# Patient Record
Sex: Female | Born: 1945 | Race: White | Hispanic: No | Marital: Married | Smoking: Never smoker
Health system: Southern US, Community
[De-identification: ages and names within clinical notes are randomized; demographics above are authoritative.]

## PROBLEM LIST (undated history)

## (undated) DIAGNOSIS — C801 Malignant (primary) neoplasm, unspecified: Secondary | ICD-10-CM

## (undated) DIAGNOSIS — E785 Hyperlipidemia, unspecified: Secondary | ICD-10-CM

## (undated) DIAGNOSIS — L940 Localized scleroderma [morphea]: Secondary | ICD-10-CM

## (undated) DIAGNOSIS — J8410 Pulmonary fibrosis, unspecified: Secondary | ICD-10-CM

## (undated) DIAGNOSIS — I2721 Secondary pulmonary arterial hypertension: Secondary | ICD-10-CM

## (undated) DIAGNOSIS — N200 Calculus of kidney: Secondary | ICD-10-CM

## (undated) DIAGNOSIS — M1990 Unspecified osteoarthritis, unspecified site: Secondary | ICD-10-CM

## (undated) DIAGNOSIS — S72009A Fracture of unspecified part of neck of unspecified femur, initial encounter for closed fracture: Secondary | ICD-10-CM

## (undated) DIAGNOSIS — S72002A Fracture of unspecified part of neck of left femur, initial encounter for closed fracture: Secondary | ICD-10-CM

## (undated) DIAGNOSIS — M358 Other specified systemic involvement of connective tissue: Secondary | ICD-10-CM

## (undated) DIAGNOSIS — K743 Primary biliary cirrhosis: Secondary | ICD-10-CM

## (undated) HISTORY — DX: Secondary pulmonary arterial hypertension: I27.21

## (undated) HISTORY — DX: Calculus of kidney: N20.0

## (undated) HISTORY — DX: Primary biliary cirrhosis: K74.3

## (undated) HISTORY — DX: Unspecified osteoarthritis, unspecified site: M19.90

## (undated) HISTORY — PX: OTHER SURGICAL HISTORY: SHX169

## (undated) HISTORY — DX: Other specified systemic involvement of connective tissue: M35.8

## (undated) HISTORY — DX: Localized scleroderma (morphea): L94.0

## (undated) HISTORY — PX: LUNG BIOPSY: SHX232

## (undated) HISTORY — DX: Fracture of unspecified part of neck of unspecified femur, initial encounter for closed fracture: S72.009A

## (undated) HISTORY — PX: SQUAMOUS CELL CARCINOMA EXCISION: SHX2433

## (undated) HISTORY — DX: Fracture of unspecified part of neck of left femur, initial encounter for closed fracture: S72.002A

## (undated) HISTORY — DX: Hyperlipidemia, unspecified: E78.5

## (undated) HISTORY — DX: Pulmonary fibrosis, unspecified: J84.10

---

## 1999-03-28 ENCOUNTER — Ambulatory Visit (HOSPITAL_COMMUNITY): Admission: RE | Admit: 1999-03-28 | Discharge: 1999-03-28 | Payer: Self-pay | Admitting: Gastroenterology

## 1999-04-21 ENCOUNTER — Ambulatory Visit (HOSPITAL_COMMUNITY): Admission: RE | Admit: 1999-04-21 | Discharge: 1999-04-21 | Payer: Self-pay | Admitting: Gastroenterology

## 2005-02-14 ENCOUNTER — Ambulatory Visit: Payer: Self-pay | Admitting: Rheumatology

## 2005-02-23 ENCOUNTER — Ambulatory Visit: Payer: Self-pay | Admitting: Rheumatology

## 2005-06-14 ENCOUNTER — Ambulatory Visit: Payer: Self-pay | Admitting: Internal Medicine

## 2005-07-13 ENCOUNTER — Ambulatory Visit: Payer: Self-pay | Admitting: Internal Medicine

## 2005-08-13 ENCOUNTER — Inpatient Hospital Stay (HOSPITAL_COMMUNITY): Admission: RE | Admit: 2005-08-13 | Discharge: 2005-08-16 | Payer: Self-pay | Admitting: Thoracic Surgery

## 2005-08-22 ENCOUNTER — Encounter: Admission: RE | Admit: 2005-08-22 | Discharge: 2005-08-22 | Payer: Self-pay | Admitting: Thoracic Surgery

## 2005-10-03 ENCOUNTER — Encounter: Admission: RE | Admit: 2005-10-03 | Discharge: 2005-10-03 | Payer: Self-pay | Admitting: Thoracic Surgery

## 2005-12-25 ENCOUNTER — Other Ambulatory Visit: Payer: Self-pay

## 2005-12-27 ENCOUNTER — Ambulatory Visit: Payer: Self-pay | Admitting: Urology

## 2006-01-25 ENCOUNTER — Other Ambulatory Visit: Payer: Self-pay

## 2006-01-31 ENCOUNTER — Ambulatory Visit: Payer: Self-pay | Admitting: Urology

## 2007-02-07 ENCOUNTER — Ambulatory Visit: Payer: Self-pay | Admitting: Urology

## 2007-02-07 ENCOUNTER — Other Ambulatory Visit: Payer: Self-pay

## 2007-02-13 ENCOUNTER — Ambulatory Visit: Payer: Self-pay | Admitting: Urology

## 2007-04-07 ENCOUNTER — Ambulatory Visit: Payer: Self-pay | Admitting: Family Medicine

## 2007-04-21 ENCOUNTER — Ambulatory Visit: Payer: Self-pay | Admitting: Family Medicine

## 2008-04-26 ENCOUNTER — Ambulatory Visit: Payer: Self-pay | Admitting: Family Medicine

## 2009-05-02 ENCOUNTER — Ambulatory Visit: Payer: Self-pay

## 2009-08-17 ENCOUNTER — Ambulatory Visit: Payer: Self-pay | Admitting: Gastroenterology

## 2009-08-17 LAB — HM COLONOSCOPY

## 2010-05-03 ENCOUNTER — Ambulatory Visit: Payer: Self-pay | Admitting: Family Medicine

## 2011-03-28 LAB — HM PAP SMEAR

## 2011-05-23 ENCOUNTER — Ambulatory Visit: Payer: Self-pay

## 2011-11-08 DIAGNOSIS — H2510 Age-related nuclear cataract, unspecified eye: Secondary | ICD-10-CM | POA: Diagnosis not present

## 2011-11-15 DIAGNOSIS — L723 Sebaceous cyst: Secondary | ICD-10-CM | POA: Diagnosis not present

## 2011-11-28 DIAGNOSIS — B351 Tinea unguium: Secondary | ICD-10-CM | POA: Diagnosis not present

## 2011-11-28 DIAGNOSIS — L719 Rosacea, unspecified: Secondary | ICD-10-CM | POA: Diagnosis not present

## 2011-11-28 DIAGNOSIS — D485 Neoplasm of uncertain behavior of skin: Secondary | ICD-10-CM | POA: Diagnosis not present

## 2011-11-28 DIAGNOSIS — L570 Actinic keratosis: Secondary | ICD-10-CM | POA: Diagnosis not present

## 2011-12-17 DIAGNOSIS — I270 Primary pulmonary hypertension: Secondary | ICD-10-CM | POA: Diagnosis not present

## 2011-12-17 DIAGNOSIS — M359 Systemic involvement of connective tissue, unspecified: Secondary | ICD-10-CM | POA: Diagnosis not present

## 2012-01-18 DIAGNOSIS — N764 Abscess of vulva: Secondary | ICD-10-CM | POA: Diagnosis not present

## 2012-01-18 DIAGNOSIS — L0291 Cutaneous abscess, unspecified: Secondary | ICD-10-CM | POA: Diagnosis not present

## 2012-01-18 DIAGNOSIS — L723 Sebaceous cyst: Secondary | ICD-10-CM | POA: Diagnosis not present

## 2012-04-09 DIAGNOSIS — J8410 Pulmonary fibrosis, unspecified: Secondary | ICD-10-CM | POA: Diagnosis not present

## 2012-04-09 DIAGNOSIS — Z0000 Encounter for general adult medical examination without abnormal findings: Secondary | ICD-10-CM | POA: Diagnosis not present

## 2012-04-09 DIAGNOSIS — G4700 Insomnia, unspecified: Secondary | ICD-10-CM | POA: Diagnosis not present

## 2012-04-09 DIAGNOSIS — Z13220 Encounter for screening for lipoid disorders: Secondary | ICD-10-CM | POA: Diagnosis not present

## 2012-04-09 DIAGNOSIS — Z23 Encounter for immunization: Secondary | ICD-10-CM | POA: Diagnosis not present

## 2012-04-09 DIAGNOSIS — I7300 Raynaud's syndrome without gangrene: Secondary | ICD-10-CM | POA: Diagnosis not present

## 2012-04-09 DIAGNOSIS — F329 Major depressive disorder, single episode, unspecified: Secondary | ICD-10-CM | POA: Diagnosis not present

## 2012-04-21 ENCOUNTER — Ambulatory Visit: Payer: Self-pay

## 2012-04-21 DIAGNOSIS — R971 Elevated cancer antigen 125 [CA 125]: Secondary | ICD-10-CM | POA: Diagnosis not present

## 2012-04-21 DIAGNOSIS — Z049 Encounter for examination and observation for unspecified reason: Secondary | ICD-10-CM | POA: Diagnosis not present

## 2012-06-25 ENCOUNTER — Ambulatory Visit: Payer: Self-pay

## 2012-06-25 DIAGNOSIS — Z1231 Encounter for screening mammogram for malignant neoplasm of breast: Secondary | ICD-10-CM | POA: Diagnosis not present

## 2012-07-30 DIAGNOSIS — H2510 Age-related nuclear cataract, unspecified eye: Secondary | ICD-10-CM | POA: Diagnosis not present

## 2012-07-30 DIAGNOSIS — D281 Benign neoplasm of vagina: Secondary | ICD-10-CM | POA: Diagnosis not present

## 2012-08-11 DIAGNOSIS — L0292 Furuncle, unspecified: Secondary | ICD-10-CM | POA: Diagnosis not present

## 2012-08-13 DIAGNOSIS — H2510 Age-related nuclear cataract, unspecified eye: Secondary | ICD-10-CM | POA: Diagnosis not present

## 2012-08-14 DIAGNOSIS — J8410 Pulmonary fibrosis, unspecified: Secondary | ICD-10-CM | POA: Diagnosis not present

## 2012-08-22 DIAGNOSIS — M7750 Other enthesopathy of unspecified foot: Secondary | ICD-10-CM | POA: Diagnosis not present

## 2012-08-22 DIAGNOSIS — M359 Systemic involvement of connective tissue, unspecified: Secondary | ICD-10-CM | POA: Diagnosis not present

## 2012-08-27 DIAGNOSIS — M810 Age-related osteoporosis without current pathological fracture: Secondary | ICD-10-CM | POA: Diagnosis not present

## 2012-08-28 DIAGNOSIS — H52229 Regular astigmatism, unspecified eye: Secondary | ICD-10-CM | POA: Diagnosis not present

## 2012-08-28 DIAGNOSIS — H269 Unspecified cataract: Secondary | ICD-10-CM | POA: Diagnosis not present

## 2012-08-28 DIAGNOSIS — H2510 Age-related nuclear cataract, unspecified eye: Secondary | ICD-10-CM | POA: Diagnosis not present

## 2012-09-09 DIAGNOSIS — H2510 Age-related nuclear cataract, unspecified eye: Secondary | ICD-10-CM | POA: Diagnosis not present

## 2012-09-11 DIAGNOSIS — H2510 Age-related nuclear cataract, unspecified eye: Secondary | ICD-10-CM | POA: Diagnosis not present

## 2012-09-11 DIAGNOSIS — H269 Unspecified cataract: Secondary | ICD-10-CM | POA: Diagnosis not present

## 2012-10-28 DIAGNOSIS — R971 Elevated cancer antigen 125 [CA 125]: Secondary | ICD-10-CM | POA: Diagnosis not present

## 2012-10-29 DIAGNOSIS — H26499 Other secondary cataract, unspecified eye: Secondary | ICD-10-CM | POA: Diagnosis not present

## 2012-10-31 DIAGNOSIS — R971 Elevated cancer antigen 125 [CA 125]: Secondary | ICD-10-CM | POA: Diagnosis not present

## 2012-11-10 DIAGNOSIS — D4959 Neoplasm of unspecified behavior of other genitourinary organ: Secondary | ICD-10-CM | POA: Diagnosis not present

## 2012-11-10 DIAGNOSIS — R971 Elevated cancer antigen 125 [CA 125]: Secondary | ICD-10-CM | POA: Diagnosis not present

## 2012-11-26 DIAGNOSIS — H35359 Cystoid macular degeneration, unspecified eye: Secondary | ICD-10-CM | POA: Diagnosis not present

## 2012-11-27 DIAGNOSIS — Z1283 Encounter for screening for malignant neoplasm of skin: Secondary | ICD-10-CM | POA: Diagnosis not present

## 2012-11-27 DIAGNOSIS — L219 Seborrheic dermatitis, unspecified: Secondary | ICD-10-CM | POA: Diagnosis not present

## 2012-12-18 DIAGNOSIS — Z961 Presence of intraocular lens: Secondary | ICD-10-CM | POA: Diagnosis not present

## 2012-12-18 DIAGNOSIS — H35059 Retinal neovascularization, unspecified, unspecified eye: Secondary | ICD-10-CM | POA: Diagnosis not present

## 2012-12-18 DIAGNOSIS — H538 Other visual disturbances: Secondary | ICD-10-CM | POA: Diagnosis not present

## 2013-02-18 DIAGNOSIS — Z961 Presence of intraocular lens: Secondary | ICD-10-CM | POA: Diagnosis not present

## 2013-03-04 DIAGNOSIS — R0609 Other forms of dyspnea: Secondary | ICD-10-CM | POA: Diagnosis not present

## 2013-03-04 DIAGNOSIS — J310 Chronic rhinitis: Secondary | ICD-10-CM | POA: Diagnosis not present

## 2013-03-04 DIAGNOSIS — R05 Cough: Secondary | ICD-10-CM | POA: Diagnosis not present

## 2013-03-04 DIAGNOSIS — J8410 Pulmonary fibrosis, unspecified: Secondary | ICD-10-CM | POA: Diagnosis not present

## 2013-03-16 ENCOUNTER — Ambulatory Visit: Payer: Self-pay | Admitting: Specialist

## 2013-03-16 DIAGNOSIS — J8410 Pulmonary fibrosis, unspecified: Secondary | ICD-10-CM | POA: Diagnosis not present

## 2013-03-16 DIAGNOSIS — R059 Cough, unspecified: Secondary | ICD-10-CM | POA: Diagnosis not present

## 2013-03-16 DIAGNOSIS — R918 Other nonspecific abnormal finding of lung field: Secondary | ICD-10-CM | POA: Diagnosis not present

## 2013-03-23 DIAGNOSIS — J8410 Pulmonary fibrosis, unspecified: Secondary | ICD-10-CM | POA: Diagnosis not present

## 2013-03-23 DIAGNOSIS — I7300 Raynaud's syndrome without gangrene: Secondary | ICD-10-CM | POA: Diagnosis not present

## 2013-03-23 DIAGNOSIS — M359 Systemic involvement of connective tissue, unspecified: Secondary | ICD-10-CM | POA: Diagnosis not present

## 2013-03-25 DIAGNOSIS — R0609 Other forms of dyspnea: Secondary | ICD-10-CM | POA: Diagnosis not present

## 2013-03-25 DIAGNOSIS — J449 Chronic obstructive pulmonary disease, unspecified: Secondary | ICD-10-CM | POA: Diagnosis not present

## 2013-03-25 DIAGNOSIS — Z23 Encounter for immunization: Secondary | ICD-10-CM | POA: Diagnosis not present

## 2013-03-25 DIAGNOSIS — J8410 Pulmonary fibrosis, unspecified: Secondary | ICD-10-CM | POA: Diagnosis not present

## 2013-04-23 DIAGNOSIS — I270 Primary pulmonary hypertension: Secondary | ICD-10-CM | POA: Diagnosis not present

## 2013-04-24 DIAGNOSIS — H6120 Impacted cerumen, unspecified ear: Secondary | ICD-10-CM | POA: Diagnosis not present

## 2013-04-24 DIAGNOSIS — J3489 Other specified disorders of nose and nasal sinuses: Secondary | ICD-10-CM | POA: Diagnosis not present

## 2013-04-28 DIAGNOSIS — L0201 Cutaneous abscess of face: Secondary | ICD-10-CM | POA: Diagnosis not present

## 2013-06-17 DIAGNOSIS — N760 Acute vaginitis: Secondary | ICD-10-CM | POA: Diagnosis not present

## 2013-07-01 DIAGNOSIS — Z0000 Encounter for general adult medical examination without abnormal findings: Secondary | ICD-10-CM | POA: Diagnosis not present

## 2013-07-01 DIAGNOSIS — E785 Hyperlipidemia, unspecified: Secondary | ICD-10-CM | POA: Diagnosis not present

## 2013-07-01 DIAGNOSIS — G4700 Insomnia, unspecified: Secondary | ICD-10-CM | POA: Diagnosis not present

## 2013-07-01 DIAGNOSIS — N200 Calculus of kidney: Secondary | ICD-10-CM | POA: Diagnosis not present

## 2013-07-01 DIAGNOSIS — Z1331 Encounter for screening for depression: Secondary | ICD-10-CM | POA: Diagnosis not present

## 2013-07-21 ENCOUNTER — Ambulatory Visit: Payer: Self-pay

## 2013-07-21 DIAGNOSIS — Z1231 Encounter for screening mammogram for malignant neoplasm of breast: Secondary | ICD-10-CM | POA: Diagnosis not present

## 2013-08-12 DIAGNOSIS — M545 Low back pain, unspecified: Secondary | ICD-10-CM | POA: Diagnosis not present

## 2013-08-12 DIAGNOSIS — M4800 Spinal stenosis, site unspecified: Secondary | ICD-10-CM | POA: Diagnosis not present

## 2013-08-13 DIAGNOSIS — J8410 Pulmonary fibrosis, unspecified: Secondary | ICD-10-CM | POA: Diagnosis not present

## 2013-09-14 DIAGNOSIS — M359 Systemic involvement of connective tissue, unspecified: Secondary | ICD-10-CM | POA: Diagnosis not present

## 2013-09-14 DIAGNOSIS — I7300 Raynaud's syndrome without gangrene: Secondary | ICD-10-CM | POA: Diagnosis not present

## 2013-09-14 DIAGNOSIS — J8410 Pulmonary fibrosis, unspecified: Secondary | ICD-10-CM | POA: Diagnosis not present

## 2013-09-23 DIAGNOSIS — J8410 Pulmonary fibrosis, unspecified: Secondary | ICD-10-CM | POA: Diagnosis not present

## 2013-09-23 DIAGNOSIS — R05 Cough: Secondary | ICD-10-CM | POA: Diagnosis not present

## 2013-09-23 DIAGNOSIS — J209 Acute bronchitis, unspecified: Secondary | ICD-10-CM | POA: Diagnosis not present

## 2013-09-23 DIAGNOSIS — R059 Cough, unspecified: Secondary | ICD-10-CM | POA: Diagnosis not present

## 2013-11-30 DIAGNOSIS — L570 Actinic keratosis: Secondary | ICD-10-CM | POA: Diagnosis not present

## 2013-11-30 DIAGNOSIS — Z1283 Encounter for screening for malignant neoplasm of skin: Secondary | ICD-10-CM | POA: Diagnosis not present

## 2013-11-30 DIAGNOSIS — L719 Rosacea, unspecified: Secondary | ICD-10-CM | POA: Diagnosis not present

## 2014-01-27 DIAGNOSIS — M359 Systemic involvement of connective tissue, unspecified: Secondary | ICD-10-CM | POA: Insufficient documentation

## 2014-01-28 DIAGNOSIS — R0602 Shortness of breath: Secondary | ICD-10-CM | POA: Diagnosis not present

## 2014-01-28 DIAGNOSIS — R0902 Hypoxemia: Secondary | ICD-10-CM | POA: Diagnosis not present

## 2014-01-28 DIAGNOSIS — J8410 Pulmonary fibrosis, unspecified: Secondary | ICD-10-CM | POA: Diagnosis not present

## 2014-01-29 DIAGNOSIS — Z23 Encounter for immunization: Secondary | ICD-10-CM | POA: Diagnosis not present

## 2014-05-04 DIAGNOSIS — I7300 Raynaud's syndrome without gangrene: Secondary | ICD-10-CM | POA: Diagnosis not present

## 2014-05-04 DIAGNOSIS — M359 Systemic involvement of connective tissue, unspecified: Secondary | ICD-10-CM | POA: Diagnosis not present

## 2014-05-24 DIAGNOSIS — Z961 Presence of intraocular lens: Secondary | ICD-10-CM | POA: Diagnosis not present

## 2014-07-02 DIAGNOSIS — G4700 Insomnia, unspecified: Secondary | ICD-10-CM | POA: Diagnosis not present

## 2014-07-02 DIAGNOSIS — E785 Hyperlipidemia, unspecified: Secondary | ICD-10-CM | POA: Diagnosis not present

## 2014-07-02 DIAGNOSIS — R5383 Other fatigue: Secondary | ICD-10-CM | POA: Diagnosis not present

## 2014-07-02 DIAGNOSIS — J8410 Pulmonary fibrosis, unspecified: Secondary | ICD-10-CM | POA: Diagnosis not present

## 2014-07-02 DIAGNOSIS — M1990 Unspecified osteoarthritis, unspecified site: Secondary | ICD-10-CM | POA: Diagnosis not present

## 2014-07-02 DIAGNOSIS — G213 Postencephalitic parkinsonism: Secondary | ICD-10-CM | POA: Diagnosis not present

## 2014-07-02 DIAGNOSIS — Z0000 Encounter for general adult medical examination without abnormal findings: Secondary | ICD-10-CM | POA: Diagnosis not present

## 2014-07-27 ENCOUNTER — Ambulatory Visit: Payer: Self-pay

## 2014-07-27 DIAGNOSIS — Z1231 Encounter for screening mammogram for malignant neoplasm of breast: Secondary | ICD-10-CM | POA: Diagnosis not present

## 2014-07-27 LAB — HM MAMMOGRAPHY

## 2014-07-29 DIAGNOSIS — J8410 Pulmonary fibrosis, unspecified: Secondary | ICD-10-CM | POA: Diagnosis not present

## 2014-07-29 DIAGNOSIS — R0609 Other forms of dyspnea: Secondary | ICD-10-CM | POA: Diagnosis not present

## 2014-07-29 DIAGNOSIS — I272 Other secondary pulmonary hypertension: Secondary | ICD-10-CM | POA: Diagnosis not present

## 2014-09-14 DIAGNOSIS — J8410 Pulmonary fibrosis, unspecified: Secondary | ICD-10-CM | POA: Diagnosis not present

## 2014-09-14 DIAGNOSIS — R05 Cough: Secondary | ICD-10-CM | POA: Diagnosis not present

## 2014-11-02 DIAGNOSIS — I7300 Raynaud's syndrome without gangrene: Secondary | ICD-10-CM | POA: Diagnosis not present

## 2014-11-02 DIAGNOSIS — M1990 Unspecified osteoarthritis, unspecified site: Secondary | ICD-10-CM | POA: Diagnosis not present

## 2014-11-02 DIAGNOSIS — M359 Systemic involvement of connective tissue, unspecified: Secondary | ICD-10-CM | POA: Diagnosis not present

## 2014-11-30 DIAGNOSIS — Z872 Personal history of diseases of the skin and subcutaneous tissue: Secondary | ICD-10-CM | POA: Diagnosis not present

## 2014-11-30 DIAGNOSIS — Z1283 Encounter for screening for malignant neoplasm of skin: Secondary | ICD-10-CM | POA: Diagnosis not present

## 2014-11-30 DIAGNOSIS — Z09 Encounter for follow-up examination after completed treatment for conditions other than malignant neoplasm: Secondary | ICD-10-CM | POA: Diagnosis not present

## 2014-11-30 DIAGNOSIS — L821 Other seborrheic keratosis: Secondary | ICD-10-CM | POA: Diagnosis not present

## 2014-11-30 DIAGNOSIS — L718 Other rosacea: Secondary | ICD-10-CM | POA: Diagnosis not present

## 2015-02-01 DIAGNOSIS — R0602 Shortness of breath: Secondary | ICD-10-CM | POA: Diagnosis not present

## 2015-02-01 DIAGNOSIS — J8410 Pulmonary fibrosis, unspecified: Secondary | ICD-10-CM | POA: Diagnosis not present

## 2015-02-08 DIAGNOSIS — J849 Interstitial pulmonary disease, unspecified: Secondary | ICD-10-CM | POA: Diagnosis not present

## 2015-02-08 DIAGNOSIS — M1990 Unspecified osteoarthritis, unspecified site: Secondary | ICD-10-CM | POA: Diagnosis not present

## 2015-02-08 DIAGNOSIS — M0579 Rheumatoid arthritis with rheumatoid factor of multiple sites without organ or systems involvement: Secondary | ICD-10-CM | POA: Diagnosis not present

## 2015-02-08 DIAGNOSIS — I7300 Raynaud's syndrome without gangrene: Secondary | ICD-10-CM | POA: Diagnosis not present

## 2015-02-17 DIAGNOSIS — Z23 Encounter for immunization: Secondary | ICD-10-CM | POA: Diagnosis not present

## 2015-02-21 ENCOUNTER — Other Ambulatory Visit: Payer: Self-pay

## 2015-02-21 MED ORDER — ZOLPIDEM TARTRATE 10 MG PO TABS: 10.0000 mg | ORAL_TABLET | Freq: Every evening | ORAL | Status: DC | PRN

## 2015-02-21 NOTE — Telephone Encounter (Signed)
Patient was last seen 09/14/14 and has appointment scheduled for 07/04/15. Practice partner number is [phone] and pharmacy is [place].

## 2015-03-14 ENCOUNTER — Ambulatory Visit (INDEPENDENT_AMBULATORY_CARE_PROVIDER_SITE_OTHER): Payer: Medicare Other | Admitting: Family Medicine

## 2015-03-14 ENCOUNTER — Encounter: Payer: Self-pay | Admitting: Family Medicine

## 2015-03-14 ENCOUNTER — Telehealth: Payer: Self-pay | Admitting: Family Medicine

## 2015-03-14 VITALS — BP 120/81 | HR 99 | Temp 97.9°F | Ht 63.5 in | Wt 140.3 lb

## 2015-03-14 DIAGNOSIS — F32A Depression, unspecified: Secondary | ICD-10-CM

## 2015-03-14 DIAGNOSIS — N952 Postmenopausal atrophic vaginitis: Secondary | ICD-10-CM

## 2015-03-14 DIAGNOSIS — L940 Localized scleroderma [morphea]: Secondary | ICD-10-CM

## 2015-03-14 DIAGNOSIS — N200 Calculus of kidney: Secondary | ICD-10-CM

## 2015-03-14 DIAGNOSIS — M1990 Unspecified osteoarthritis, unspecified site: Secondary | ICD-10-CM

## 2015-03-14 DIAGNOSIS — F419 Anxiety disorder, unspecified: Secondary | ICD-10-CM

## 2015-03-14 DIAGNOSIS — M358 Other specified systemic involvement of connective tissue: Secondary | ICD-10-CM

## 2015-03-14 DIAGNOSIS — J8410 Pulmonary fibrosis, unspecified: Secondary | ICD-10-CM

## 2015-03-14 DIAGNOSIS — R05 Cough: Secondary | ICD-10-CM | POA: Diagnosis not present

## 2015-03-14 DIAGNOSIS — G4700 Insomnia, unspecified: Secondary | ICD-10-CM

## 2015-03-14 DIAGNOSIS — F329 Major depressive disorder, single episode, unspecified: Secondary | ICD-10-CM | POA: Insufficient documentation

## 2015-03-14 DIAGNOSIS — R059 Cough, unspecified: Secondary | ICD-10-CM

## 2015-03-14 DIAGNOSIS — E785 Hyperlipidemia, unspecified: Secondary | ICD-10-CM

## 2015-03-14 HISTORY — DX: Calculus of kidney: N20.0

## 2015-03-14 MED ORDER — HYDROCOD POLST-CPM POLST ER 10-8 MG/5ML PO SUER: 5.0000 mL | Freq: Every evening | ORAL | Status: DC | PRN

## 2015-03-14 MED ORDER — BENZONATATE 200 MG PO CAPS
200.0000 mg | ORAL_CAPSULE | Freq: Three times a day (TID) | ORAL | Status: DC | PRN
Start: 2015-03-14 — End: 2015-05-11

## 2015-03-14 MED ORDER — ALBUTEROL SULFATE HFA 108 (90 BASE) MCG/ACT IN AERS: 2.0000 | INHALATION_SPRAY | Freq: Four times a day (QID) | RESPIRATORY_TRACT | Status: DC | PRN

## 2015-03-14 NOTE — Telephone Encounter (Signed)
Abstract [phone]

## 2015-03-14 NOTE — Progress Notes (Signed)
BP 120/81 mmHg  Pulse 99  Temp(Src) 97.9 F (36.6 C)  Ht 5' 3.5" (1.613 m)  Wt 140 lb 4.8 oz (63.64 kg)  BMI 24.46 kg/m2  SpO2 93%   Subjective:    Patient ID: Melanie Rollins, female    DOB: [birth year], 69 y.o.   MRN: [id number]  HPI: Melanie Rollins is a 69 y.o. female  Chief Complaint  Patient presents with  . Cough    pt states she has had this cough since Saturday (03/12/15) night. States it is a non-productive cough.   UPPER RESPIRATORY TRACT INFECTION- smoke exposure over the weekend Duration: for 2 days Worst symptom: cough Fever: no Cough: yes Shortness of breath: yes Wheezing: no Chest pain: no Chest tightness: yes Chest congestion: yes Nasal congestion: yes Runny nose: no Post nasal drip: no Sneezing: no Sore throat: no Swollen glands: no Sinus pressure: no Headache: yes Face pain: no Toothache: no Ear pain: no  Ear pressure: no  Eyes red/itching:no Eye drainage/crusting: no  Vomiting: no Rash: no Fatigue: yes Sick contacts: no Strep contacts: no  Context: worse Recurrent sinusitis: no Relief with OTC cold/cough medications: no  Treatments attempted: cough syrup    Relevant past medical, surgical, family and social history reviewed and updated as indicated. Interim medical history since our last visit reviewed. Allergies and medications reviewed and updated.  Review of Systems  Constitutional: Negative.   HENT: Negative.   Respiratory: Positive for cough, chest tightness and shortness of breath. Negative for apnea, choking, wheezing and stridor.   Cardiovascular: Negative.   Psychiatric/Behavioral: Negative.     Per HPI unless specifically indicated above     Objective:    BP 120/81 mmHg  Pulse 99  Temp(Src) 97.9 F (36.6 C)  Ht 5' 3.5" (1.613 m)  Wt 140 lb 4.8 oz (63.64 kg)  BMI 24.46 kg/m2  SpO2 93%  Wt Readings from Last 3 Encounters:  03/14/15 140 lb 4.8 oz (63.64 kg)  09/14/14 141 lb (63.957 kg)    Physical Exam   Constitutional: She is oriented to person, place, and time. She appears well-developed and well-nourished. No distress.  HENT:  Head: Normocephalic and atraumatic.  Right Ear: Hearing and external ear normal.  Left Ear: Hearing and external ear normal.  Nose: Nose normal.  Mouth/Throat: Oropharynx is clear and moist. No oropharyngeal exudate.  Eyes: Conjunctivae, EOM and lids are normal. Pupils are equal, round, and reactive to light. Right eye exhibits no discharge. Left eye exhibits no discharge. No scleral icterus.  Neck: Normal range of motion. Neck supple. No JVD present. No tracheal deviation present. No thyromegaly present.  Cardiovascular: Normal rate, regular rhythm, normal heart sounds and intact distal pulses.  Exam reveals no gallop and no friction rub.   No murmur heard. Pulmonary/Chest: Effort normal and breath sounds normal. No stridor. No respiratory distress. She has no wheezes. She has no rales. She exhibits no tenderness.  Musculoskeletal: Normal range of motion.  Lymphadenopathy:    She has no cervical adenopathy.  Neurological: She is alert and oriented to person, place, and time.  Skin: Skin is warm, dry and intact. No rash noted. No erythema. No pallor.  Psychiatric: She has a normal mood and affect. Her speech is normal and behavior is normal. Judgment and thought content normal. Cognition and memory are normal.  Nursing note and vitals reviewed.   Results for orders placed or performed in visit on 03/14/15  HM MAMMOGRAPHY  Result Value Ref Range  HM Mammogram from PP   HM PAP SMEAR  Result Value Ref Range   HM Pap smear from PP   HM COLONOSCOPY  Result Value Ref Range   HM Colonoscopy from PP       Assessment & Plan:   Problem List Items Addressed This Visit    None    Visit Diagnoses    Cough    -  Primary    Likely due to smoke exposure. Clear lungs. Inhaler and cough suppressants for comfort. Call if not getting better or getting worse.          Follow up plan: Return if symptoms worsen or fail to improve.

## 2015-04-21 DIAGNOSIS — J84112 Idiopathic pulmonary fibrosis: Secondary | ICD-10-CM | POA: Diagnosis not present

## 2015-04-21 DIAGNOSIS — R918 Other nonspecific abnormal finding of lung field: Secondary | ICD-10-CM | POA: Diagnosis not present

## 2015-04-21 DIAGNOSIS — M359 Systemic involvement of connective tissue, unspecified: Secondary | ICD-10-CM | POA: Diagnosis not present

## 2015-04-21 DIAGNOSIS — R05 Cough: Secondary | ICD-10-CM | POA: Diagnosis not present

## 2015-05-10 ENCOUNTER — Other Ambulatory Visit: Payer: Self-pay | Admitting: Specialist

## 2015-05-10 DIAGNOSIS — M359 Systemic involvement of connective tissue, unspecified: Secondary | ICD-10-CM

## 2015-05-10 DIAGNOSIS — J8410 Pulmonary fibrosis, unspecified: Secondary | ICD-10-CM | POA: Diagnosis not present

## 2015-05-10 DIAGNOSIS — R05 Cough: Secondary | ICD-10-CM | POA: Diagnosis not present

## 2015-05-10 DIAGNOSIS — I272 Other secondary pulmonary hypertension: Secondary | ICD-10-CM | POA: Diagnosis not present

## 2015-05-10 DIAGNOSIS — R0609 Other forms of dyspnea: Secondary | ICD-10-CM | POA: Diagnosis not present

## 2015-05-11 ENCOUNTER — Ambulatory Visit (INDEPENDENT_AMBULATORY_CARE_PROVIDER_SITE_OTHER): Payer: Medicare Other | Admitting: Family Medicine

## 2015-05-11 ENCOUNTER — Encounter: Payer: Self-pay | Admitting: Family Medicine

## 2015-05-11 VITALS — BP 143/78 | HR 79 | Temp 97.6°F | Wt 140.0 lb

## 2015-05-11 DIAGNOSIS — B029 Zoster without complications: Secondary | ICD-10-CM

## 2015-05-11 DIAGNOSIS — F419 Anxiety disorder, unspecified: Secondary | ICD-10-CM | POA: Diagnosis not present

## 2015-05-11 DIAGNOSIS — G4700 Insomnia, unspecified: Secondary | ICD-10-CM

## 2015-05-11 DIAGNOSIS — J8410 Pulmonary fibrosis, unspecified: Secondary | ICD-10-CM | POA: Diagnosis not present

## 2015-05-11 MED ORDER — SERTRALINE HCL 50 MG PO TABS: ORAL_TABLET | ORAL | Status: DC

## 2015-05-11 MED ORDER — VALACYCLOVIR HCL 1 G PO TABS: 1000.0000 mg | ORAL_TABLET | Freq: Three times a day (TID) | ORAL | Status: AC

## 2015-05-11 NOTE — Progress Notes (Signed)
BP 143/78 mmHg  Pulse 79  Temp(Src) 97.6 F (36.4 C)  Wt 140 lb (63.504 kg)  SpO2 98%   Subjective:    Patient ID: Melanie Rollins, female    DOB: [birth year], 69 y.o.   MRN: [id number]  HPI: Melanie Rollins is a 69 y.o. female  Chief Complaint  Patient presents with  . Herpes Zoster    she thinks she has shingles again, she doesn't see anything yet but feels like it's about to break out.   She thinks she is having another outbreak of shingles; first outbreak around her mid-50s;  Had it twice when she worked at a bad job and had a lot of stress Having tingling and some rash on the right side of the neck and shoulder She knows if she gets on the medicine quickly, it should help keep it from getting worse Symptoms just started yesterday or the day before; had a little tiny blister come up; previous episode had similar blisters, little row of blisters before on the right hip; first outbreak was the jabbing pain and blisters beside the left ear She tried anti-itch cream on it Totally stressed the last year; one thing after another  BP is high for her today  She has pulmonary fibrosis; on prednisone daily  Under a lot of stress; would like to be on something just mild for anxiety; was on Luvox for 12 years and tolerated it well; no hx of bipolar d/o  Relevant past medical, surgical, family and social history reviewed and updated as indicated  Interim medical history since our last visit reviewed.  Allergies and medications reviewed and updated. She has ambien 10 mg listed in her medicine list; she has been on this for some time; she does not regularly take the 10 mg strength  Review of Systems  Psychiatric/Behavioral: Negative for suicidal ideas and self-injury.  Per HPI unless specifically indicated above     Objective:    BP 143/78 mmHg  Pulse 79  Temp(Src) 97.6 F (36.4 C)  Wt 140 lb (63.504 kg)  SpO2 98%  Wt Readings from Last 3 Encounters:  05/11/15 140 lb (63.504  kg)  03/14/15 140 lb 4.8 oz (63.64 kg)  09/14/14 141 lb (63.957 kg)    Physical Exam  Constitutional: She appears well-developed and well-nourished. No distress.  Cardiovascular: Normal rate and regular rhythm.   Pulmonary/Chest: Effort normal.  Skin:  Along the right side of the neck, there is mild erythema and suggestion of what could have been an unroofed vesicular lesion lower lateral neck right side; no current vesicules; no scale, no lichenification  Psychiatric: She has a normal mood and affect. Her speech is normal and behavior is normal. Thought content normal. Her mood appears not anxious. She does not exhibit a depressed mood.      Assessment & Plan:   Problem List Items Addressed This Visit      Respiratory   Pulmonary fibrosis ([place])    On chronic prednisone therapy which could weaken immune system        Other   Insomnia    Current recommendation is 5 mg for women; I cautioned the patient about use of Lorrin Mais, and showed her study published Feb 2012; I encouraged her to talk with her primary about getting off of this      Anxiety    Does not report depression, just anxiety; she denies what sounds to be hx of bipolar d/o; will start her on SSRI with  close f/u with primary; reasons to call beforehand reviewed      Relevant Medications   sertraline (ZOLOFT) 50 MG tablet    Other Visit Diagnoses    Shingles outbreak    -  Primary    on chronic prednisone; 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  An after-visit summary was printed and given to the patient at [facility].  Please see the patient instructions which may contain other information and recommendations beyond what is mentioned above in the assessment and plan.  Meds ordered this encounter  Medications  .  lansoprazole (PREVACID) 30 MG capsule    Sig: Take 30 mg by mouth daily.  . sertraline (ZOLOFT) 50 MG tablet    Sig: Take one-half of a pill by mouth daily for 6-8 days, then one whole pill daily    Dispense:  27 tablet    Refill:  0  . valACYclovir (VALTREX) 1000 MG tablet    Sig: Take 1 tablet (1,000 mg total) by mouth 3 (three) times daily.    Dispense:  21 tablet    Refill:  0

## 2015-05-11 NOTE — Patient Instructions (Addendum)
Start the new medicine for the outbreak Try B complex stress tabs daily and/or L-lysine to help prevent outbreaks Start the new mood medicine Call us right away with any problems I do strongly encourage you to cut back or stop the Ambien or at least talk to your provider about an alternative

## 2015-05-16 DIAGNOSIS — F419 Anxiety disorder, unspecified: Secondary | ICD-10-CM | POA: Insufficient documentation

## 2015-05-16 NOTE — Assessment & Plan Note (Signed)
Does not report depression, just anxiety; she denies what sounds to be hx of bipolar d/o; will start her on SSRI with close f/u with primary; reasons to call beforehand reviewed

## 2015-05-16 NOTE — Assessment & Plan Note (Signed)
On chronic prednisone therapy which could weaken immune system

## 2015-05-16 NOTE — Assessment & Plan Note (Signed)
Current recommendation is 5 mg for women; I cautioned the patient about use of Lorrin Mais, and showed her study published Feb 2012; I encouraged her to talk with her primary about getting off of this

## 2015-05-18 ENCOUNTER — Ambulatory Visit
Admission: RE | Admit: 2015-05-18 | Discharge: 2015-05-18 | Disposition: A | Discharge status: Home / Self Care | Payer: Medicare Other | Source: Ambulatory Visit | Attending: Specialist | Admitting: Specialist

## 2015-05-18 DIAGNOSIS — N200 Calculus of kidney: Secondary | ICD-10-CM | POA: Diagnosis not present

## 2015-05-18 DIAGNOSIS — M359 Systemic involvement of connective tissue, unspecified: Secondary | ICD-10-CM | POA: Diagnosis present

## 2015-05-18 DIAGNOSIS — R05 Cough: Secondary | ICD-10-CM | POA: Diagnosis not present

## 2015-05-18 DIAGNOSIS — J8410 Pulmonary fibrosis, unspecified: Secondary | ICD-10-CM | POA: Diagnosis not present

## 2015-05-18 DIAGNOSIS — R0602 Shortness of breath: Secondary | ICD-10-CM | POA: Diagnosis not present

## 2015-05-24 DIAGNOSIS — R0609 Other forms of dyspnea: Secondary | ICD-10-CM | POA: Diagnosis not present

## 2015-05-24 DIAGNOSIS — I272 Other secondary pulmonary hypertension: Secondary | ICD-10-CM | POA: Diagnosis not present

## 2015-06-07 DIAGNOSIS — M359 Systemic involvement of connective tissue, unspecified: Secondary | ICD-10-CM | POA: Diagnosis not present

## 2015-06-07 DIAGNOSIS — J8410 Pulmonary fibrosis, unspecified: Secondary | ICD-10-CM | POA: Diagnosis not present

## 2015-06-08 ENCOUNTER — Ambulatory Visit (INDEPENDENT_AMBULATORY_CARE_PROVIDER_SITE_OTHER): Payer: Medicare Other | Admitting: Unknown Physician Specialty

## 2015-06-08 ENCOUNTER — Encounter: Payer: Self-pay | Admitting: Unknown Physician Specialty

## 2015-06-08 VITALS — BP 110/74 | HR 99 | Temp 98.4°F | Ht 63.8 in | Wt 134.8 lb

## 2015-06-08 DIAGNOSIS — F418 Other specified anxiety disorders: Secondary | ICD-10-CM | POA: Diagnosis not present

## 2015-06-08 DIAGNOSIS — F329 Major depressive disorder, single episode, unspecified: Secondary | ICD-10-CM

## 2015-06-08 DIAGNOSIS — F32A Depression, unspecified: Secondary | ICD-10-CM

## 2015-06-08 DIAGNOSIS — F419 Anxiety disorder, unspecified: Principal | ICD-10-CM

## 2015-06-08 MED ORDER — SERTRALINE HCL 50 MG PO TABS: 50.0000 mg | ORAL_TABLET | Freq: Every day | ORAL | Status: DC

## 2015-06-08 NOTE — Assessment & Plan Note (Signed)
Improved with Zoloft 50 mgs.

## 2015-06-08 NOTE — Progress Notes (Signed)
   BP 110/74 mmHg  Pulse 99  Temp(Src) 98.4 F (36.9 C)  Ht 5' 3.8" (1.621 m)  Wt 134 lb 12.8 oz (61.145 kg)  BMI 23.27 kg/m2  SpO2 93%   Subjective:    Patient ID: Melanie Rollins, female    DOB: 11/23/1945, 70 y.o.   MRN: [id number]  HPI: Melanie Rollins is a 70 y.o. female  Chief Complaint  Patient presents with  . Anxiety   GAD 7 : Generalized Anxiety Score 06/08/2015  Nervous, Anxious, on Edge 1  Control/stop worrying 1  Worry too much - different things 1  Trouble relaxing 0  Restless 0  Easily annoyed or irritable 0  Afraid - awful might happen 1  Total GAD 7 Score 4    Depression screen PHQ 2/9 06/08/2015  Decreased Interest 0  Down, Depressed, Hopeless 0  PHQ - 2 Score 0     Last visit she was started on Zoloft for anxiety due to a tough couple of years.  She reports doing better  Relevant past medical, surgical, family and social history reviewed and updated as indicated. Interim medical history since our last visit reviewed. Allergies and medications reviewed and updated.  Review of Systems  Per HPI unless specifically indicated above     Objective:    BP 110/74 mmHg  Pulse 99  Temp(Src) 98.4 F (36.9 C)  Ht 5' 3.8" (1.621 m)  Wt 134 lb 12.8 oz (61.145 kg)  BMI 23.27 kg/m2  SpO2 93%  Wt Readings from Last 3 Encounters:  06/08/15 134 lb 12.8 oz (61.145 kg)  05/11/15 140 lb (63.504 kg)  03/14/15 140 lb 4.8 oz (63.64 kg)    Physical Exam  Constitutional: She is oriented to person, place, and time. She appears well-developed and well-nourished. No distress.  HENT:  Head: Normocephalic and atraumatic.  Eyes: Conjunctivae and lids are normal. Right eye exhibits no discharge. Left eye exhibits no discharge. No scleral icterus.  Neck: Normal range of motion. Neck supple. No JVD present. Carotid bruit is not present.  Cardiovascular: Normal rate, regular rhythm and normal heart sounds.   Pulmonary/Chest: Effort normal and breath sounds normal.   Abdominal: Normal appearance. There is no splenomegaly or hepatomegaly.  Musculoskeletal: Normal range of motion.  Neurological: She is alert and oriented to person, place, and time.  Skin: Skin is warm, dry and intact. No rash noted. No pallor.  Psychiatric: She has a normal mood and affect. Her behavior is normal. Judgment and thought content normal.     Assessment & Plan:   Problem List Items Addressed This Visit      Unprioritized   Anxiety and depression - Primary    Improved with Zoloft 50 mgs.            Follow up plan: Return in about 6 months (around 12/06/2015), or if symptoms worsen or fail to improve, for and for physical.

## 2015-06-15 DIAGNOSIS — Z961 Presence of intraocular lens: Secondary | ICD-10-CM | POA: Diagnosis not present

## 2015-07-04 ENCOUNTER — Ambulatory Visit (INDEPENDENT_AMBULATORY_CARE_PROVIDER_SITE_OTHER): Payer: Medicare Other | Admitting: Unknown Physician Specialty

## 2015-07-04 ENCOUNTER — Encounter: Payer: Self-pay | Admitting: Unknown Physician Specialty

## 2015-07-04 ENCOUNTER — Other Ambulatory Visit: Payer: Self-pay

## 2015-07-04 VITALS — BP 114/67 | HR 76 | Temp 97.5°F | Ht 64.2 in | Wt 132.8 lb

## 2015-07-04 DIAGNOSIS — Z5181 Encounter for therapeutic drug level monitoring: Secondary | ICD-10-CM

## 2015-07-04 DIAGNOSIS — Z23 Encounter for immunization: Secondary | ICD-10-CM | POA: Diagnosis not present

## 2015-07-04 DIAGNOSIS — R102 Pelvic and perineal pain: Secondary | ICD-10-CM

## 2015-07-04 DIAGNOSIS — F418 Other specified anxiety disorders: Secondary | ICD-10-CM | POA: Diagnosis not present

## 2015-07-04 DIAGNOSIS — E785 Hyperlipidemia, unspecified: Secondary | ICD-10-CM | POA: Diagnosis not present

## 2015-07-04 DIAGNOSIS — Z0000 Encounter for general adult medical examination without abnormal findings: Secondary | ICD-10-CM | POA: Diagnosis not present

## 2015-07-04 DIAGNOSIS — Z124 Encounter for screening for malignant neoplasm of cervix: Secondary | ICD-10-CM | POA: Diagnosis not present

## 2015-07-04 DIAGNOSIS — F32A Depression, unspecified: Secondary | ICD-10-CM

## 2015-07-04 DIAGNOSIS — F329 Major depressive disorder, single episode, unspecified: Secondary | ICD-10-CM

## 2015-07-04 DIAGNOSIS — G4700 Insomnia, unspecified: Secondary | ICD-10-CM | POA: Diagnosis not present

## 2015-07-04 DIAGNOSIS — F419 Anxiety disorder, unspecified: Secondary | ICD-10-CM

## 2015-07-04 MED ORDER — ESTROGENS, CONJUGATED 0.625 MG/GM VA CREA
1.0000 | TOPICAL_CREAM | VAGINAL | Status: DC
Start: 2015-07-04 — End: 2018-01-13

## 2015-07-04 NOTE — Progress Notes (Signed)
BP 114/67 mmHg  Pulse 76  Temp(Src) 97.5 F (36.4 C)  Ht 5' 4.2" (1.631 m)  Wt 132 lb 12.8 oz (60.238 kg)  BMI 22.64 kg/m2  SpO2 96%  LMP  (LMP Unknown)   Subjective:    Patient ID: Melanie Rollins, female    DOB: Jan 13, 1946, 70 y.o.   MRN: [id number]  HPI: Melanie Rollins is a 70 y.o. female  Chief Complaint  Patient presents with  . Medicare Wellness  . Medication Refill    pt states she would like a refill on premarin cream   Occasional back pain and pelvic pressure.  Not pain or burining on urination.    Menopause:  Wants a refill of Premarin vaginal cream using on occasion.    Depression" "I feel like I have my life back" Takes partial Ambien on occasion.   Depression screen [facility] 2/9 07/04/2015 06/08/2015  Decreased Interest 0 0  Down, Depressed, Hopeless 0 0  PHQ - 2 Score 0 0   Fall Risk  07/04/2015 05/13/2015  Falls in the past year? No No   Functional Status Survey: Is the patient deaf or have difficulty hearing?: Yes (pt states she has trouble with her left ear sometimes) Does the patient have difficulty seeing, even when wearing glasses/contacts?: No Does the patient have difficulty concentrating, remembering, or making decisions?: No Does the patient have difficulty walking or climbing stairs?: No Does the patient have difficulty dressing or bathing?: No Does the patient have difficulty doing errands alone such as visiting a doctor's office or shopping?: No  Relevant past medical, surgical, family and social history reviewed and updated as indicated. Interim medical history since our last visit reviewed. Allergies and medications reviewed and updated.  Review of Systems  Constitutional: Negative.   HENT: Negative.   Eyes: Negative.   Respiratory:       Pulmonary fibrosis through pulmonary  Cardiovascular: Negative.   Gastrointestinal: Negative.   Endocrine: Negative.   Genitourinary: Negative.   Musculoskeletal: Negative.   Skin: Negative.    Allergic/Immunologic: Negative.   Neurological: Negative.   Hematological: Negative.   Psychiatric/Behavioral: Negative.     Per HPI unless specifically indicated above     Objective:    BP 114/67 mmHg  Pulse 76  Temp(Src) 97.5 F (36.4 C)  Ht 5' 4.2" (1.631 m)  Wt 132 lb 12.8 oz (60.238 kg)  BMI 22.64 kg/m2  SpO2 96%  LMP  (LMP Unknown)  Wt Readings from Last 3 Encounters:  07/04/15 132 lb 12.8 oz (60.238 kg)  06/08/15 134 lb 12.8 oz (61.145 kg)  05/11/15 140 lb (63.504 kg)    Physical Exam  Constitutional: She is oriented to person, place, and time. She appears well-developed and well-nourished.  HENT:  Head: Normocephalic and atraumatic.  Eyes: Pupils are equal, round, and reactive to light. Right eye exhibits no discharge. Left eye exhibits no discharge. No scleral icterus.  Neck: Normal range of motion. Neck supple. Carotid bruit is not present. No thyromegaly present.  Cardiovascular: Normal rate, regular rhythm and normal heart sounds.  Exam reveals no gallop and no friction rub.   No murmur heard. Pulmonary/Chest: Effort normal and breath sounds normal. No respiratory distress. She has no wheezes. She has no rales.  Abdominal: Soft. Bowel sounds are normal. There is no tenderness. There is no rebound.  Genitourinary: Vagina normal and uterus normal. No breast swelling, tenderness or discharge. Cervix exhibits no motion tenderness, no discharge and no friability. Right adnexum displays  no mass, no tenderness and no fullness. Left adnexum displays no mass, no tenderness and no fullness.  Musculoskeletal: Normal range of motion.  Lymphadenopathy:    She has no cervical adenopathy.  Neurological: She is alert and oriented to person, place, and time.  Skin: Skin is warm, dry and intact. No rash noted.  Psychiatric: She has a normal mood and affect. Her speech is normal and behavior is normal. Judgment and thought content normal. Cognition and memory are normal.        Assessment & Plan:   Problem List Items Addressed This Visit      Unprioritized   Anxiety and depression   Hyperlipidemia   Relevant Orders   Lipid Panel w/o Chol/HDL Ratio   Insomnia    Other Visit Diagnoses    Need for pneumococcal vaccination    -  Primary    Relevant Orders    Pneumococcal polysaccharide vaccine 23-valent greater than or equal to 2yo subcutaneous/IM (Completed)    Routine general medical examination at a health care facility        Pelvic pain in female        Relevant Orders    UA/M w/rflx Culture, Routine    Medication monitoring encounter        Relevant Orders    Comprehensive metabolic panel        Follow up plan: Return in about 1 year (around 07/03/2016).

## 2015-07-04 NOTE — Telephone Encounter (Signed)
Patient was here for an appointment, needs this refilled.

## 2015-07-05 LAB — COMPREHENSIVE METABOLIC PANEL
ALBUMIN: 4 g/dL (ref 3.6–4.8)
ALK PHOS: 44 IU/L (ref 39–117)
ALT: 20 IU/L (ref 0–32)
AST: 26 IU/L (ref 0–40)
Albumin/Globulin Ratio: 1.7 (ref 1.1–2.5)
BUN/Creatinine Ratio: 9 — ABNORMAL LOW (ref 11–26)
BUN: 8 mg/dL (ref 8–27)
Bilirubin Total: 0.2 mg/dL (ref 0.0–1.2)
CO2: 24 mmol/L (ref 18–29)
CREATININE: 0.86 mg/dL (ref 0.57–1.00)
Calcium: 9.1 mg/dL (ref 8.7–10.3)
Chloride: 101 mmol/L (ref 96–106)
GFR calc Af Amer: 80 mL/min/{1.73_m2} (ref >59)
GFR calc non Af Amer: 70 mL/min/{1.73_m2} (ref >59)
GLUCOSE: 84 mg/dL (ref 65–99)
Globulin, Total: 2.4 g/dL (ref 1.5–4.5)
Potassium: 4.1 mmol/L (ref 3.5–5.2)
Sodium: 141 mmol/L (ref 134–144)
Total Protein: 6.4 g/dL (ref 6.0–8.5)

## 2015-07-05 LAB — LIPID PANEL W/O CHOL/HDL RATIO
CHOLESTEROL TOTAL: 198 mg/dL (ref 100–199)
HDL: 79 mg/dL (ref >39)
LDL CALC: 91 mg/dL (ref 0–99)
TRIGLYCERIDES: 142 mg/dL (ref 0–149)
VLDL Cholesterol Cal: 28 mg/dL (ref 5–40)

## 2015-07-06 LAB — URINE CULTURE, REFLEX

## 2015-07-06 LAB — MICROSCOPIC EXAMINATION

## 2015-07-06 LAB — UA/M W/RFLX CULTURE, ROUTINE
BILIRUBIN UA: NEGATIVE
GLUCOSE, UA: NEGATIVE
Ketones, UA: NEGATIVE
Nitrite, UA: NEGATIVE
PH UA: 6.5 (ref 5.0–7.5)
PROTEIN UA: NEGATIVE
SPEC GRAV UA: 1.02 (ref 1.005–1.030)
UUROB: 0.2 mg/dL (ref 0.2–1.0)

## 2015-07-08 ENCOUNTER — Telehealth: Payer: Self-pay | Admitting: Unknown Physician Specialty

## 2015-07-08 NOTE — Telephone Encounter (Signed)
Pt would like to know if final test for UA has come in.  Please call her.

## 2015-07-08 NOTE — Telephone Encounter (Signed)
Called pt back and left a message.  Culture showed mixed flora and no treatment is necessary

## 2015-07-25 DIAGNOSIS — H6123 Impacted cerumen, bilateral: Secondary | ICD-10-CM | POA: Diagnosis not present

## 2015-07-25 DIAGNOSIS — J324 Chronic pansinusitis: Secondary | ICD-10-CM | POA: Diagnosis not present

## 2015-07-25 DIAGNOSIS — J310 Chronic rhinitis: Secondary | ICD-10-CM | POA: Diagnosis not present

## 2015-07-25 DIAGNOSIS — J340 Abscess, furuncle and carbuncle of nose: Secondary | ICD-10-CM | POA: Diagnosis not present

## 2015-07-25 DIAGNOSIS — H93293 Other abnormal auditory perceptions, bilateral: Secondary | ICD-10-CM | POA: Diagnosis not present

## 2015-07-28 ENCOUNTER — Encounter: Payer: Self-pay | Admitting: *Deleted

## 2015-08-03 ENCOUNTER — Telehealth: Payer: Self-pay | Admitting: Unknown Physician Specialty

## 2015-08-03 NOTE — Telephone Encounter (Signed)
Called and spoke to patient. She states she has been taking an antibiotic and is almost through with it but she has developed some itching. She states diflucan does not work for her and would like something else sent into [facility].

## 2015-08-03 NOTE — Telephone Encounter (Signed)
Have her try monistat OTC. If that doesn't work, let us know.

## 2015-08-03 NOTE — Telephone Encounter (Signed)
Pt would like to have something called in to [facility].

## 2015-08-03 NOTE — Telephone Encounter (Signed)
Called and left patient a voicemail asking for her to please return my call.

## 2015-08-03 NOTE — Telephone Encounter (Signed)
Patient returned call. She states that she has tried vagistat so I placed the patient on hold and asked Dr. Wynetta Emery about this. She said vagistat is the same as Monistat so she wants the patient to come in and be seen. So I told the patient this and scheduled her an appointment for tomorrow morning.

## 2015-08-03 NOTE — Telephone Encounter (Signed)
Pt stated she didn't want diflucan.

## 2015-08-04 ENCOUNTER — Ambulatory Visit (INDEPENDENT_AMBULATORY_CARE_PROVIDER_SITE_OTHER): Payer: Medicare Other | Admitting: Family Medicine

## 2015-08-04 ENCOUNTER — Encounter: Payer: Self-pay | Admitting: Family Medicine

## 2015-08-04 VITALS — BP 137/73 | HR 64 | Temp 97.3°F | Ht 64.0 in | Wt 131.0 lb

## 2015-08-04 DIAGNOSIS — L298 Other pruritus: Secondary | ICD-10-CM

## 2015-08-04 DIAGNOSIS — A499 Bacterial infection, unspecified: Secondary | ICD-10-CM

## 2015-08-04 DIAGNOSIS — N760 Acute vaginitis: Secondary | ICD-10-CM | POA: Diagnosis not present

## 2015-08-04 DIAGNOSIS — N898 Other specified noninflammatory disorders of vagina: Secondary | ICD-10-CM

## 2015-08-04 DIAGNOSIS — B9689 Other specified bacterial agents as the cause of diseases classified elsewhere: Secondary | ICD-10-CM

## 2015-08-04 LAB — WET PREP FOR TRICH, YEAST, CLUE
CLUE CELL EXAM: POSITIVE — AB
TRICHOMONAS EXAM: NEGATIVE
Yeast Exam: NEGATIVE

## 2015-08-04 MED ORDER — METRONIDAZOLE 500 MG PO TABS: 500.0000 mg | ORAL_TABLET | Freq: Two times a day (BID) | ORAL | Status: DC

## 2015-08-04 NOTE — Patient Instructions (Signed)

## 2015-08-04 NOTE — Progress Notes (Signed)
BP 137/73 mmHg  Pulse 64  Temp(Src) 97.3 F (36.3 C)  Ht _0  (1.626 m)  Wt 131 lb (59.421 kg)  BMI 22.47 kg/m2  SpO2 94%  LMP  (LMP Unknown)   Subjective:    Patient ID: [name], female    DOB: 02/18/1946, 70 y.o.   MRN: [id number]  HPI: Melanie Rollins is a 70 y.o. female  Chief Complaint  Patient presents with  . Vaginal Itching    Patient was recently on antibiotics, she has tried otc medicine that has not helped.   VAGINAL Itching- was on bactrim last week and then started itching. Used vagistat and felt like it helped, so came in to get it checked out Duration: 1 week Discharge description: none  Pruritus: yes Dysuria: yes Malodorous: no Urinary frequency: no Fevers: no Abdominal pain: no  Not using her premarin regularly History of sexually transmitted diseases: no Recent antibiotic use: yes Context: better  Treatments attempted: antifungal and vagisil  Relevant past medical, surgical, family and social history reviewed and updated as indicated. Interim medical history since our last visit reviewed. Allergies and medications reviewed and updated.  Review of Systems  Constitutional: Negative.   Respiratory: Negative.   Cardiovascular: Negative.   Genitourinary: Negative for dysuria, urgency, frequency, hematuria, flank pain, decreased urine volume, vaginal bleeding, vaginal discharge, enuresis, difficulty urinating, genital sores, vaginal pain, menstrual problem, pelvic pain and dyspareunia.  Psychiatric/Behavioral: Negative.     Per HPI unless specifically indicated above     Objective:    BP 137/73 mmHg  Pulse 64  Temp(Src) 97.3 F (36.3 C)  Ht _1  (1.626 m)  Wt 131 lb (59.421 kg)  BMI 22.47 kg/m2  SpO2 94%  LMP  (LMP Unknown)  Wt Readings from Last 3 Encounters:  08/04/15 131 lb (59.421 kg)  07/04/15 132 lb 12.8 oz (60.238 kg)  06/08/15 134 lb 12.8 oz (61.145 kg)    Physical Exam  Constitutional: She is oriented to person, place,  and time. She appears well-developed and well-nourished. No distress.  HENT:  Head: Normocephalic and atraumatic.  Right Ear: Hearing normal.  Left Ear: Hearing normal.  Nose: Nose normal.  Eyes: Conjunctivae and lids are normal. Right eye exhibits no discharge. Left eye exhibits no discharge. No scleral icterus.  Pulmonary/Chest: Effort normal. No respiratory distress.  Genitourinary: There is erythema and tenderness in the vagina. No bleeding in the vagina. No foreign body around the vagina. No signs of injury around the vagina. Vaginal discharge found.  Erythema on labia bilaterally, slight excoriation  Musculoskeletal: Normal range of motion.  Neurological: She is alert and oriented to person, place, and time.  Skin: Skin is intact. No rash noted.  Psychiatric: She has a normal mood and affect. Her speech is normal and behavior is normal. Judgment and thought content normal. Cognition and memory are normal.    Results for orders placed or performed in visit on 07/04/15  Microscopic Examination  Result Value Ref Range   WBC, UA 0-5 0 -  5 /hpf   RBC, UA 3-10 (A) 0 -  2 /hpf   Epithelial Cells (non renal) 0-10 0 - 10 /hpf   Mucus, UA Present Not Estab.   Bacteria, UA Moderate (A) None seen/Few  Lipid Panel w/o Chol/HDL Ratio  Result Value Ref Range   Cholesterol, Total 198 100 - 199 mg/dL   Triglycerides 142 0 - 149 mg/dL   HDL 79 >39 mg/dL   VLDL Cholesterol Cal 28  5 - 40 mg/dL   LDL Calculated 91 0 - 99 mg/dL  UA/M w/rflx Culture, Routine  Result Value Ref Range   Specific Gravity, UA 1.020 1.005 - 1.030   pH, UA 6.5 5.0 - 7.5   Color, UA Yellow Yellow   Appearance Ur Cloudy (A) Clear   Leukocytes, UA 2+ (A) Negative   Protein, UA Negative Negative/Trace   Glucose, UA Negative Negative   Ketones, UA Negative Negative   RBC, UA 2+ (A) Negative   Bilirubin, UA Negative Negative   Urobilinogen, Ur 0.2 0.2 - 1.0 mg/dL   Nitrite, UA Negative Negative   Microscopic  Examination See below:    Urinalysis Reflex Comment   Urine Culture, Routine  Result Value Ref Range   Urine Culture, Routine Final report    Urine Culture result 1 Comment   Comprehensive metabolic panel  Result Value Ref Range   Glucose 84 65 - 99 mg/dL   BUN 8 8 - 27 mg/dL   Creatinine, Ser 0.86 0.57 - 1.00 mg/dL   GFR calc non Af Amer 70 >59 mL/min/1.73   GFR calc Af Amer 80 >59 mL/min/1.73   BUN/Creatinine Ratio 9 (L) 11 - 26   Sodium 141 134 - 144 mmol/L   Potassium 4.1 3.5 - 5.2 mmol/L   Chloride 101 96 - 106 mmol/L   CO2 24 18 - 29 mmol/L   Calcium 9.1 8.7 - 10.3 mg/dL   Total Protein 6.4 6.0 - 8.5 g/dL   Albumin 4.0 3.6 - 4.8 g/dL   Globulin, Total 2.4 1.5 - 4.5 g/dL   Albumin/Globulin Ratio 1.7 1.1 - 2.5   Bilirubin Total 0.2 0.0 - 1.2 mg/dL   Alkaline Phosphatase 44 39 - 117 IU/L   AST 26 0 - 40 IU/L   ALT 20 0 - 32 IU/L      Assessment & Plan:   Problem List Items Addressed This Visit    None    Visit Diagnoses    BV (bacterial vaginosis)    -  Primary    Will start on flagyl. No alcohol. Call with any problems. Call if not getting better or getting worse.     Relevant Medications    metroNIDAZOLE (FLAGYL) 500 MG tablet    Vaginal itching        Wet prep + for clue cells.     Relevant Orders    WET PREP FOR [place], YEAST, CLUE        Follow up plan: Return if symptoms worsen or fail to improve.

## 2015-08-09 DIAGNOSIS — M545 Low back pain: Secondary | ICD-10-CM | POA: Diagnosis not present

## 2015-08-09 DIAGNOSIS — M359 Systemic involvement of connective tissue, unspecified: Secondary | ICD-10-CM | POA: Diagnosis not present

## 2015-08-09 DIAGNOSIS — J849 Interstitial pulmonary disease, unspecified: Secondary | ICD-10-CM | POA: Diagnosis not present

## 2015-08-09 DIAGNOSIS — I7300 Raynaud's syndrome without gangrene: Secondary | ICD-10-CM | POA: Diagnosis not present

## 2015-08-09 DIAGNOSIS — G8929 Other chronic pain: Secondary | ICD-10-CM | POA: Diagnosis not present

## 2015-08-09 DIAGNOSIS — M47816 Spondylosis without myelopathy or radiculopathy, lumbar region: Secondary | ICD-10-CM | POA: Diagnosis not present

## 2015-12-01 DIAGNOSIS — Z79899 Other long term (current) drug therapy: Secondary | ICD-10-CM | POA: Diagnosis not present

## 2015-12-06 DIAGNOSIS — Z09 Encounter for follow-up examination after completed treatment for conditions other than malignant neoplasm: Secondary | ICD-10-CM | POA: Diagnosis not present

## 2015-12-06 DIAGNOSIS — Z1283 Encounter for screening for malignant neoplasm of skin: Secondary | ICD-10-CM | POA: Diagnosis not present

## 2015-12-06 DIAGNOSIS — Z872 Personal history of diseases of the skin and subcutaneous tissue: Secondary | ICD-10-CM | POA: Diagnosis not present

## 2015-12-13 ENCOUNTER — Encounter: Payer: Self-pay | Admitting: Unknown Physician Specialty

## 2015-12-13 ENCOUNTER — Ambulatory Visit (INDEPENDENT_AMBULATORY_CARE_PROVIDER_SITE_OTHER): Payer: Medicare Other | Admitting: Unknown Physician Specialty

## 2015-12-13 VITALS — BP 115/71 | HR 69 | Ht 64.7 in | Wt 138.2 lb

## 2015-12-13 DIAGNOSIS — Z0000 Encounter for general adult medical examination without abnormal findings: Secondary | ICD-10-CM

## 2015-12-13 DIAGNOSIS — F419 Anxiety disorder, unspecified: Secondary | ICD-10-CM

## 2015-12-13 NOTE — Progress Notes (Signed)
   BP 115/71 (BP Location: Left Arm, Patient Position: Sitting, Cuff Size: Normal)   Pulse 69   Ht 5' 4.7" (1.643 m) Comment: pt had shoes on  Wt 138 lb 3.2 oz (62.7 kg) Comment: pt had shoes on  LMP  (LMP Unknown)   SpO2 97%   BMI 23.21 kg/m    Subjective:    Patient ID: Melanie Rollins, female    DOB: 20-Oct-1945, 70 y.o.   MRN: [id number]  HPI: Melanie Rollins is a 70 y.o. female  Chief Complaint  Patient presents with  . Hyperlipidemia  . Anxiety  . Labs Only    Hep C order entered   Anxiety Pt is taking Sertraline and doing well.  She has been able to stop Ambien and Clonazepam.    Depression screen [facility] 2/9 12/13/2015 07/04/2015 06/08/2015  Decreased Interest 0 0 0  Down, Depressed, Hopeless 0 0 0  PHQ - 2 Score 0 0 0    Relevant past medical, surgical, family and social history reviewed and updated as indicated. Interim medical history since our last visit reviewed. Allergies and medications reviewed and updated.  Review of Systems  Per HPI unless specifically indicated above     Objective:    BP 115/71 (BP Location: Left Arm, Patient Position: Sitting, Cuff Size: Normal)   Pulse 69   Ht 5' 4.7" (1.643 m) Comment: pt had shoes on  Wt 138 lb 3.2 oz (62.7 kg) Comment: pt had shoes on  LMP  (LMP Unknown)   SpO2 97%   BMI 23.21 kg/m   Wt Readings from Last 3 Encounters:  12/13/15 138 lb 3.2 oz (62.7 kg)  08/04/15 131 lb (59.4 kg)  07/04/15 132 lb 12.8 oz (60.2 kg)    Physical Exam  Constitutional: She is oriented to person, place, and time. She appears well-developed and well-nourished. No distress.  HENT:  Head: Normocephalic and atraumatic.  Eyes: Conjunctivae and lids are normal. Right eye exhibits no discharge. Left eye exhibits no discharge. No scleral icterus.  Neck: Normal range of motion. Neck supple. No JVD present. Carotid bruit is not present.  Cardiovascular: Normal rate, regular rhythm and normal heart sounds.   Pulmonary/Chest: Effort normal.  She has decreased breath sounds.  Abdominal: Normal appearance. There is no splenomegaly or hepatomegaly.  Musculoskeletal: Normal range of motion.  Neurological: She is alert and oriented to person, place, and time.  Skin: Skin is warm, dry and intact. No rash noted. No pallor.  Psychiatric: She has a normal mood and affect. Her behavior is normal. Judgment and thought content normal.    Results for orders placed or performed in visit on 08/04/15  WET PREP FOR [place], YEAST, CLUE  Result Value Ref Range   Trichomonas Exam Negative Negative   Yeast Exam Negative Negative   Clue Cell Exam Positive (A) Negative      Assessment & Plan:   Problem List Items Addressed This Visit      Unprioritized   Anxiety    Stable, continue present medications.         Other Visit Diagnoses    Health care maintenance    -  Primary   Relevant Orders   Hepatitis C antibody       Follow up plan: Return in about 6 months (around 06/14/2016) for physical.

## 2015-12-13 NOTE — Assessment & Plan Note (Signed)
Stable, continue present medications.   

## 2015-12-21 DIAGNOSIS — L718 Other rosacea: Secondary | ICD-10-CM | POA: Diagnosis not present

## 2016-01-03 DIAGNOSIS — M359 Systemic involvement of connective tissue, unspecified: Secondary | ICD-10-CM | POA: Diagnosis not present

## 2016-01-03 DIAGNOSIS — R0602 Shortness of breath: Secondary | ICD-10-CM | POA: Diagnosis not present

## 2016-01-03 DIAGNOSIS — J8410 Pulmonary fibrosis, unspecified: Secondary | ICD-10-CM | POA: Diagnosis not present

## 2016-01-31 ENCOUNTER — Ambulatory Visit: Payer: Medicare Other

## 2016-02-06 ENCOUNTER — Encounter: Payer: Medicare Other | Attending: Specialist | Admitting: Respiratory Therapy

## 2016-02-06 VITALS — Ht 64.7 in | Wt 141.7 lb

## 2016-02-06 DIAGNOSIS — J8410 Pulmonary fibrosis, unspecified: Secondary | ICD-10-CM | POA: Insufficient documentation

## 2016-02-06 NOTE — Progress Notes (Signed)
Pulmonary Individual Treatment Plan ° °Patient Details  °Name: Melanie Rollins °MRN: [id number] °Date of Birth: 12/21/1945 °Referring Provider:   °Flowsheet Row Pulmonary Rehab from 02/06/2016 in [facility]  °Referring Provider  Fleming, Herbon MD  °  ° ° °Initial Encounter Date:  °Flowsheet Row Pulmonary Rehab from 02/06/2016 in [facility]  °Date  02/06/16  °Referring Provider  Fleming, Herbon MD  °  ° ° °Visit Diagnosis: Pulmonary fibrosis (HCC) ° °Patient's Home Medications on Admission: ° °Current Outpatient Prescriptions:  °•  Acetylcysteine (NAC) 600 MG CAPS, Take by mouth. Take 2 tablets daily, Disp: , Rfl:  °•  albuterol (PROVENTIL HFA;VENTOLIN HFA) 108 (90 BASE) MCG/ACT inhaler, Inhale 2 puffs into the lungs every 6 (six) hours as needed for wheezing or shortness of breath., Disp: 1 Inhaler, Rfl: 0 °•  alendronate (FOSAMAX) 70 MG tablet, Take 70 mg by mouth once a week. Take with a full glass of water on an empty stomach., Disp: , Rfl:  °•  Calcium Citrate-Vitamin D (CALCIUM + D PO), Take 600 mg by mouth daily., Disp: , Rfl:  °•  CINNAMON PO, Take 1,000 mg by mouth daily., Disp: , Rfl:  °•  conjugated estrogens (PREMARIN) vaginal cream, Place 1 Applicatorful vaginally 2 (two) times a week. Reported on 05/11/2015, Disp: 320 g, Rfl: 2 °•  hydroxychloroquine (PLAQUENIL) 200 MG tablet, Take by mouth. Take 2 tablets once a day, Disp: , Rfl:  °•  L-Lysine 500 MG TABS, Take 500 mg by mouth daily., Disp: , Rfl:  °•  lansoprazole (PREVACID) 15 MG capsule, Take 15 mg by mouth daily at 12 noon., Disp: , Rfl:  °•  Magnesium 250 MG TABS, Take by mouth daily., Disp: , Rfl:  °•  Multiple Vitamin (MULTIVITAMIN) tablet, Take 1 tablet by mouth daily., Disp: , Rfl:  °•  Omega-3 Fatty Acids (FISH OIL) 1000 MG CAPS, Take 1,000 mg by mouth daily., Disp: , Rfl:  °•  predniSONE (DELTASONE) 5 MG tablet, Take 5 mg by mouth daily with breakfast., Disp: , Rfl:  °•  sertraline (ZOLOFT) 50 MG  tablet, Take 1 tablet (50 mg total) by mouth daily. Take one-half of a pill by mouth daily for 6-8 days, then one whole pill daily, Disp: 90 tablet, Rfl: 3 ° °Past Medical History: °Past Medical History:  °Diagnosis Date  °• Arthritis   ° RA  °• Pulmonary fibrosis (HCC)   °• Reynolds syndrome (HCC)   ° ° °Tobacco Use: °History  °Smoking Status  °• Never Smoker  °Smokeless Tobacco  °• Never Used  ° ° °Labs: °Recent Review Flowsheet Data   ° Labs for [facility] Cardiac and Pulmonary Rehab Latest Ref Rng & Units 07/04/2015  ° Cholestrol 100 - 199 mg/dL 198  ° LDLCALC 0 - 99 mg/dL 91  ° HDL >39 mg/dL 79  ° Trlycerides 0 - 149 mg/dL 142  °  ° ° ° °ADL [facility]: °  °  °Pulmonary Assessment Scores   ° Row Name 02/06/16 1555  °  °  °  ° ADL [facility]  ° ADL Phase Entry    ° SOB Score total 46    ° Rest 0    ° Walk 2    ° Stairs 3    ° Bath 1    ° Dress 1    ° Shop 2    °  ° ° °Pulmonary Function Assessment: °  °  °Pulmonary Function Assessment - 02/06/16 1553   °  °   Pulmonary Function Tests   FVC% 80 %   FEV1% 98 %   FEV1/FVC Ratio 93.74   RV% 28 %   DLCO% 61 %     Initial Spirometry Results   Comments Spirometry 02/06/16; DLCO and TLC 01/03/16     Breath   Bilateral Breath Sounds Decreased;Rales;Basilar   Shortness of Breath Yes;Limiting activity      Exercise Target Goals: Date: 02/06/16  Exercise Program Goal: Individual exercise prescription set with THRR, safety & activity barriers. Participant demonstrates ability to understand and report RPE using BORG scale, to self-measure pulse accurately, and to acknowledge the importance of the exercise prescription.  Exercise Prescription Goal: Starting with aerobic activity 30 plus minutes a day, 3 days per week for initial exercise prescription. Provide home exercise prescription and guidelines that participant acknowledges understanding prior to discharge.  Activity Barriers & Risk Stratification:     Activity Barriers & Cardiac Risk Stratification - 02/06/16 1552       Activity Barriers & Cardiac Risk Stratification   Activity Barriers Arthritis;Shortness of Breath;Deconditioning   Cardiac Risk Stratification Moderate      6 Minute Walk:     6 Minute Walk    Row Name 02/06/16 1556         6 Minute Walk   Phase Initial     Distance 1275 feet     Walk Time 4.97 minutes     # of Rest Breaks 6  For oxygen desaturation: 7 sec, 6 sec, 10 sec, 11 sec, 12 sec, 16 sec     MPH 2.92     METS 2.98     RPE 13     Perceived Dyspnea  4     VO2 Peak 10.44     Symptoms No     Resting HR 62 bpm     Resting BP 116/70     Max Ex. HR 95 bpm     Max Ex. BP 126/64     2 Minute Post BP 126/60       Interval HR   Baseline HR 62     1 Minute HR 70     2 Minute HR 90     3 Minute HR 93     4 Minute HR 95     5 Minute HR 90     6 Minute HR 88     2 Minute Post HR 62     Interval Heart Rate? Yes       Interval Oxygen   Interval Oxygen? Yes     Baseline Oxygen Saturation % 99 %     Baseline Liters of Oxygen 0 L  Room Air     1 Minute Oxygen Saturation % 78 %  quick recovery to 94%      1 Minute Liters of Oxygen 0 L     2 Minute Oxygen Saturation % 80 %     2 Minute Liters of Oxygen 0 L     3 Minute Oxygen Saturation % 91 %  80% at 2:44     3 Minute Liters of Oxygen 0 L     4 Minute Oxygen Saturation % 95 %  77% at 3:34     4 Minute Liters of Oxygen 0 L     5 Minute Oxygen Saturation % 92 %  79% at 4:27     5 Minute Liters of Oxygen 0 L     6 Minute Oxygen Saturation % 85 %  81%  at 5:14     6 Minute Liters of Oxygen 0 L     2 Minute Post Oxygen Saturation % 100 %     2 Minute Post Liters of Oxygen 0 L        Initial Exercise Prescription:     Initial Exercise Prescription - 02/06/16 1500      Date of Initial Exercise RX and Referring Provider   Date 02/06/16   Referring Provider Wallene Huh MD     Treadmill   MPH 2.6   Grade 0.5   Minutes 15  2 min intervals (oxygen saturation dependent)   METs 3.71     Elliptical    Level 1   Speed 2.6   Minutes 15     T5 Nustep   Level 2   Watts --  60-80 spm   Minutes 15   METs 2     Prescription Details   Frequency (times per week) 3   Duration Progress to 45 minutes of aerobic exercise without signs/symptoms of physical distress     Intensity   THRR 40-80% of Max Heartrate 98-133   Ratings of Perceived Exertion 11-13   Perceived Dyspnea 0-4     Progression   Progression Continue to progress workloads to maintain intensity without signs/symptoms of physical distress.     Resistance Training   Training Prescription Yes   Weight 2 lbs   Reps 10-12      Perform Capillary Blood Glucose checks as needed.  Exercise Prescription Changes:   Exercise Comments:     Exercise Comments    Row Name 02/06/16 1602           Exercise Comments Marland Mcalpine wants to be able to clean the house without getting overly tired          Discharge Exercise Prescription (Final Exercise Prescription Changes):    Nutrition:  Target Goals: Understanding of nutrition guidelines, daily intake of sodium <1576m, cholesterol <2014m calories 30% from fat and 7% or less from saturated fats, daily to have 5 or more servings of fruits and vegetables.  Biometrics:     Pre Biometrics - 02/06/16 1602      Pre Biometrics   Height 5' 4.7" (1.643 m)   Weight 141 lb 11.2 oz (64.3 kg)   Waist Circumference 33.25 inches   Hip Circumference 37.5 inches   Waist to Hip Ratio 0.89 %   BMI (Calculated) 23.8       Nutrition Therapy Plan and Nutrition Goals:   Nutrition Discharge: Rate Your Plate Scores:   Psychosocial: Target Goals: Acknowledge presence or absence of depression, maximize coping skills, provide positive support system. Participant is able to verbalize types and ability to use techniques and skills needed for reducing stress and depression.  Initial Review & Psychosocial Screening:     Initial Psych Review & Screening - 02/06/16 [id number]   Comments Ms FuWolfers very positive about dealing with her Pulmonary Fibrosis and has support from her children and husband. [place]    Barriers   Psychosocial barriers to participate in program The patient should benefit from training in stress management and relaxation.     Screening Interventions   Interventions Encouraged to exercise      Quality of Life Scores:     Quality of Life - 02/06/16 1602      Quality of Life Scores  Health/Function Pre 24.63 %   Socioeconomic Pre 21 %   Psych/Spiritual Pre 21 %   Family Pre 21 %   GLOBAL Pre 22.66 %      PHQ-9: Recent Review Flowsheet Data    Depression screen [facility] 2/9 02/06/2016 12/13/2015 07/04/2015 06/08/2015   Decreased Interest 0 0 0 0   Down, Depressed, Hopeless 0 0 0 0   PHQ - 2 Score 0 0 0 0   Altered sleeping 1 - - -   Tired, decreased energy 3 - - -   Change in appetite 0 - - -   Feeling bad or failure about yourself  0 - - -   Trouble concentrating 0 - - -   Moving slowly or fidgety/restless 0 - - -   Suicidal thoughts 0 - - -   PHQ-9 Score 4 - - -   Difficult doing work/chores Somewhat difficult - - -      Psychosocial Evaluation and Intervention:   Psychosocial Re-Evaluation:  Education: Education Goals: Education classes will be provided on a weekly basis, covering required topics. Participant will state understanding/return demonstration of topics presented.  Learning Barriers/Preferences:   Education Topics: Initial Evaluation Education: - Verbal, written and demonstration of respiratory meds, RPE/PD scales, oximetry and breathing techniques. Instruction on use of nebulizers and MDIs: cleaning and proper use, rinsing mouth with steroid doses and importance of monitoring MDI activations. Flowsheet Row Pulmonary Rehab from 02/06/2016 in [facility]  Date  02/06/16  Educator  LB  Instruction Review Code  2- meets  goals/outcomes      General Nutrition Guidelines/Fats and Fiber: -Group instruction provided by verbal, written material, models and posters to present the general guidelines for heart healthy nutrition. Gives an explanation and review of dietary fats and fiber.   Controlling Sodium/Reading Food Labels: -Group verbal and written material supporting the discussion of sodium use in heart healthy nutrition. Review and explanation with models, verbal and written materials for utilization of the food label.   Exercise Physiology & Risk Factors: - Group verbal and written instruction with models to review the exercise physiology of the cardiovascular system and associated critical values. Details cardiovascular disease risk factors and the goals associated with each risk factor.   Aerobic Exercise & Resistance Training: - Gives group verbal and written discussion on the health impact of inactivity. On the components of aerobic and resistive training programs and the benefits of this training and how to safely progress through these programs.   Flexibility, Balance, General Exercise Guidelines: - Provides group verbal and written instruction on the benefits of flexibility and balance training programs. Provides general exercise guidelines with specific guidelines to those with heart or lung disease. Demonstration and skill practice provided.   Stress Management: - Provides group verbal and written instruction about the health risks of elevated stress, cause of high stress, and healthy ways to reduce stress.   Depression: - Provides group verbal and written instruction on the correlation between heart/lung disease and depressed mood, treatment options, and the stigmas associated with seeking treatment.   Exercise & Equipment Safety: - Individual verbal instruction and demonstration of equipment use and safety with use of the equipment.   Infection Prevention: - Provides verbal and  written material to individual with discussion of infection control including proper hand washing and proper equipment cleaning during exercise session. Flowsheet Row Pulmonary Rehab from 02/06/2016 in [facility] Cardiac [facility] Pulmonary Rehab  Date  02/06/16  Educator  LB  Instruction Review Code  2- meets goals/outcomes      Falls Prevention: - Provides verbal and written material to individual with discussion of falls prevention and safety. Flowsheet Row Pulmonary Rehab from 02/06/2016 in [facility] Cardiac and Pulmonary Rehab  Date  02/06/16  Educator  LB  Instruction Review Code  2- meets goals/outcomes      Diabetes: - Individual verbal and written instruction to review signs/symptoms of diabetes, desired ranges of glucose level fasting, after meals and with exercise. Advice that pre and post exercise glucose checks will be done for 3 sessions at entry of program.   Chronic Lung Diseases: - Group verbal and written instruction to review new updates, new respiratory medications, new advancements in procedures and treatments. Provide informative websites and "800" numbers of self-education.   Lung Procedures: - Group verbal and written instruction to describe testing methods done to diagnose lung disease. Review the outcome of test results. Describe the treatment choices: Pulmonary Function Tests, ABGs and oximetry.   Energy Conservation: - Provide group verbal and written instruction for methods to conserve energy, plan and organize activities. Instruct on pacing techniques, use of adaptive equipment and posture/positioning to relieve shortness of breath.   Triggers: - Group verbal and written instruction to review types of environmental controls: home humidity, furnaces, filters, dust mite/pet prevention, HEPA vacuums. To discuss weather changes, air quality and the benefits of nasal washing.   Exacerbations: - Group verbal and written instruction to provide: warning signs, infection  symptoms, calling MD promptly, preventive modes, and value of vaccinations. Review: effective airway clearance, coughing and/or vibration techniques. Create an Sports administrator.   Oxygen: - Individual and group verbal and written instruction on oxygen therapy. Includes supplement oxygen, available portable oxygen systems, continuous and intermittent flow rates, oxygen safety, concentrators, and Medicare reimbursement for oxygen.   Respiratory Medications: - Group verbal and written instruction to review medications for lung disease. Drug class, frequency, complications, importance of spacers, rinsing mouth after steroid MDI's, and proper cleaning methods for nebulizers.   AED/CPR: - Group verbal and written instruction with the use of models to demonstrate the basic use of the AED with the basic ABC's of resuscitation.   Breathing Retraining: - Provides individuals verbal and written instruction on purpose, frequency, and proper technique of diaphragmatic breathing and pursed-lipped breathing. Applies individual practice skills. Flowsheet Row Pulmonary Rehab from 02/06/2016 in [facility] Pulmonary Re[facility]  Date  02/06/16  Educator  LB  Instruction Review Code  2- meets goals/outcomes      Anatomy and Physiology of the Lungs: - Group verbal and written instruction with the use of models to provide basic lung anatomy and physiology related to function, structure and complications of lung disease.   Heart Failure: - Group verbal and written instruction on the basics of heart failure: signs/symptoms, treatments, explanation of ejection fraction, enlarged heart and cardiomyopathy.   Sleep Apnea: - Individual verbal and written instruction to review Obstructive Sleep Apnea. Review of risk factors, methods for diagnosing and types of masks and machines for OSA.   Anxiety: - Provides group, verbal and written instruction on the correlation between heart/lung disease and anxiety, treatment  options, and management of anxiety.   Relaxation: - Provides group, verbal and written instruction about the benefits of relaxation for patients with heart/lung disease. Also provides patients with examples of relaxation techniques.   Knowledge Questionnaire Score:     Knowledge Questionnaire Score - 02/06/16 1552      Knowledge Questionnaire Score  Pre Score 9/10       Core Components/Risk Factors/Patient Goals at Admission:     Personal Goals and Risk Factors at Admission - 02/06/16 1558      Core Components/Risk Factors/Patient Goals on Admission    Weight Management Yes   Intervention Weight Management: Develop a combined nutrition and exercise program designed to reach desired caloric intake, while maintaining appropriate intake of nutrient and fiber, sodium and fats, and appropriate energy expenditure required for the weight goal.;Weight Management: Provide education and appropriate resources to help participant work on and attain dietary goals.   Admit Weight 141 lb 11.2 oz (64.3 kg)   Goal Weight: Short Term 138 lb (62.6 kg)   Goal Weight: Long Term 125 lb (56.7 kg)   Expected Outcomes Short Term: Continue to assess and modify interventions until short term weight is achieved;Long Term: Adherence to nutrition and physical activity/exercise program aimed toward attainment of established weight goal;Weight Maintenance: Understanding of the daily nutrition guidelines, which includes 25-35% calories from fat, 7% or less cal from saturated fats, less than 239m cholesterol, less than 1.5gm of sodium, & 5 or more servings of fruits and vegetables daily   Sedentary Yes   Intervention Provide advice, education, support and counseling about physical activity/exercise needs.;Develop an individualized exercise prescription for aerobic and resistive training based on initial evaluation findings, risk stratification, comorbidities and participant's personal goals.  Home Treadmill and  Silver Sneaker Classes   Expected Outcomes Achievement of increased cardiorespiratory fitness and enhanced flexibility, muscular endurance and strength shown through measurements of functional capacity and personal statement of participant.   Increase Strength and Stamina Yes   Intervention Provide advice, education, support and counseling about physical activity/exercise needs.;Develop an individualized exercise prescription for aerobic and resistive training based on initial evaluation findings, risk stratification, comorbidities and participant's personal goals.   Expected Outcomes Achievement of increased cardiorespiratory fitness and enhanced flexibility, muscular endurance and strength shown through measurements of functional capacity and personal statement of participant.   Improve shortness of breath with ADL's Yes   Intervention Provide education, individualized exercise plan and daily activity instruction to help decrease symptoms of SOB with activities of daily living.  Complete housework with less shortness of breath   Expected Outcomes Short Term: Achieves a reduction of symptoms when performing activities of daily living.   Develop more efficient breathing techniques such as purse lipped breathing and diaphragmatic breathing; and practicing self-pacing with activity Yes   Intervention Provide education, demonstration and support about specific breathing techniuqes utilized for more efficient breathing. Include techniques such as pursed lipped breathing, diaphragmatic breathing and self-pacing activity.   Expected Outcomes Short Term: Participant will be able to demonstrate and use breathing techniques as needed throughout daily activities.      Core Components/Risk Factors/Patient Goals Review:    Core Components/Risk Factors/Patient Goals at Discharge (Final Review):    ITP Comments:   Comments: Ms FMcintireplans to start [facility] on 02/13/2016 and attend 3 days/week.

## 2016-02-06 NOTE — Patient Instructions (Signed)
Patient Instructions  Patient Details  Name: Melanie Rollins MRN: [id number] Date of Birth: December 30, 1945 Referring Provider:  Erby Pian, MD  Below are the personal goals you chose as well as exercise and nutrition goals. Our goal is to help you keep on track towards obtaining and maintaining your goals. We will be discussing your progress on these goals with you throughout the program.  Initial Exercise Prescription:     Initial Exercise Prescription - 02/06/16 1500      Date of Initial Exercise RX and Referring Provider   Date 02/06/16   Referring Provider Wallene Huh MD     Treadmill   MPH 2.6   Grade 0.5   Minutes 15  2 min intervals (oxygen saturation dependent)   METs 3.71     Elliptical   Level 1   Speed 2.6   Minutes 15     T5 Nustep   Level 2   Watts --  60-80 spm   Minutes 15   METs 2     Prescription Details   Frequency (times per week) 3   Duration Progress to 45 minutes of aerobic exercise without signs/symptoms of physical distress     Intensity   THRR 40-80% of Max Heartrate 98-133   Ratings of Perceived Exertion 11-13   Perceived Dyspnea 0-4     Progression   Progression Continue to progress workloads to maintain intensity without signs/symptoms of physical distress.     Resistance Training   Training Prescription Yes   Weight 2 lbs   Reps 10-12      Exercise Goals: Frequency: Be able to perform aerobic exercise three times per week working toward 3-5 days per week.  Intensity: Work with a perceived exertion of 11 (fairly light) - 15 (hard) as tolerated. Follow your new exercise prescription and watch for changes in prescription as you progress with the program. Changes will be reviewed with you when they are made.  Duration: You should be able to do 30 minutes of continuous aerobic exercise in addition to a 5 minute warm-up and a 5 minute cool-down routine.  Nutrition Goals: Your personal nutrition goals will be established when  you do your nutrition analysis with the dietician.  The following are nutrition guidelines to follow: Cholesterol < 270m/day Sodium < 15038mday Fiber: Women over 50 yrs - 21 grams per day  Personal Goals:     Personal Goals and Risk Factors at Admission - 02/06/16 1558      Core Components/Risk Factors/Patient Goals on Admission    Weight Management Yes   Intervention Weight Management: Develop a combined nutrition and exercise program designed to reach desired caloric intake, while maintaining appropriate intake of nutrient and fiber, sodium and fats, and appropriate energy expenditure required for the weight goal.;Weight Management: Provide education and appropriate resources to help participant work on and attain dietary goals.   Admit Weight 141 lb 11.2 oz (64.3 kg)   Goal Weight: Short Term 138 lb (62.6 kg)   Goal Weight: Long Term 125 lb (56.7 kg)   Expected Outcomes Short Term: Continue to assess and modify interventions until short term weight is achieved;Long Term: Adherence to nutrition and physical activity/exercise program aimed toward attainment of established weight goal;Weight Maintenance: Understanding of the daily nutrition guidelines, which includes 25-35% calories from fat, 7% or less cal from saturated fats, less than 20041mholesterol, less than 1.5gm of sodium, & 5 or more servings of fruits and vegetables daily   Sedentary Yes  Intervention Provide advice, education, support and counseling about physical activity/exercise needs.;Develop an individualized exercise prescription for aerobic and resistive training based on initial evaluation findings, risk stratification, comorbidities and participant's personal goals.  Home Treadmill and Silver Sneaker Classes   Expected Outcomes Achievement of increased cardiorespiratory fitness and enhanced flexibility, muscular endurance and strength shown through measurements of functional capacity and personal statement of  participant.   Increase Strength and Stamina Yes   Intervention Provide advice, education, support and counseling about physical activity/exercise needs.;Develop an individualized exercise prescription for aerobic and resistive training based on initial evaluation findings, risk stratification, comorbidities and participant's personal goals.   Expected Outcomes Achievement of increased cardiorespiratory fitness and enhanced flexibility, muscular endurance and strength shown through measurements of functional capacity and personal statement of participant.   Improve shortness of breath with ADL's Yes   Intervention Provide education, individualized exercise plan and daily activity instruction to help decrease symptoms of SOB with activities of daily living.  Complete housework with less shortness of breath   Expected Outcomes Short Term: Achieves a reduction of symptoms when performing activities of daily living.   Develop more efficient breathing techniques such as purse lipped breathing and diaphragmatic breathing; and practicing self-pacing with activity Yes   Intervention Provide education, demonstration and support about specific breathing techniuqes utilized for more efficient breathing. Include techniques such as pursed lipped breathing, diaphragmatic breathing and self-pacing activity.   Expected Outcomes Short Term: Participant will be able to demonstrate and use breathing techniques as needed throughout daily activities.      Tobacco Use Initial Evaluation: History  Smoking Status   Never Smoker  Smokeless Tobacco   Never Used    Copy of goals given to participant.

## 2016-02-07 DIAGNOSIS — I7300 Raynaud's syndrome without gangrene: Secondary | ICD-10-CM | POA: Diagnosis not present

## 2016-02-07 DIAGNOSIS — M791 Myalgia: Secondary | ICD-10-CM | POA: Diagnosis not present

## 2016-02-07 DIAGNOSIS — M359 Systemic involvement of connective tissue, unspecified: Secondary | ICD-10-CM | POA: Diagnosis not present

## 2016-02-07 DIAGNOSIS — J849 Interstitial pulmonary disease, unspecified: Secondary | ICD-10-CM | POA: Diagnosis not present

## 2016-02-13 ENCOUNTER — Encounter: Payer: Medicare Other | Admitting: *Deleted

## 2016-02-13 DIAGNOSIS — J8410 Pulmonary fibrosis, unspecified: Secondary | ICD-10-CM | POA: Diagnosis not present

## 2016-02-13 NOTE — Progress Notes (Signed)
Daily Session Note  Patient Details  Name: Melanie Rollins MRN: [id number] Date of Birth: [birth year] Referring Provider:   Flowsheet Row Pulmonary Rehab from 02/06/2016 in [facility] Cardiac and Pulmonary Rehab  Referring Provider  Wallene Huh MD      Encounter Date: 02/13/2016  Check In:     Session Check In - 02/13/16 1418      Check-In   Location [facility]-Cardiac & Pulmonary Rehab   Staff Present Carson Myrtle, BS, RRT, Respiratory Therapist;Kaziah Krizek Amedeo Plenty, BS, ACSM CEP, Exercise Physiologist;Amanda Oletta Darter, BA, ACSM CEP, Exercise Physiologist   Supervising physician immediately available to respond to emergencies [facility] immediately available ER MD   Physician(s) Mariea Clonts and Clearnce Hasten    Medication changes reported     No   Fall or balance concerns reported    No   Warm-up and Cool-down Performed on first and last piece of equipment   Resistance Training Performed Yes   VAD Patient? No     Pain Assessment   Currently in Pain? No/denies   Multiple Pain Sites No           Exercise Prescription Changes - 02/13/16 1400      Response to Exercise   Blood Pressure (Admit) 100/60   Blood Pressure (Exercise) 130/70   Blood Pressure (Exit) 107/60   Heart Rate (Admit) 63 bpm   Heart Rate (Exercise) 126 bpm   Heart Rate (Exit) 73 bpm   Oxygen Saturation (Admit) 98 %   Oxygen Saturation (Exercise) 91 %   Oxygen Saturation (Exit) 98 %   Rating of Perceived Exertion (Exercise) 13   Perceived Dyspnea (Exercise) 4   Symptoms none   Duration Progress to 30 minutes of continuous aerobic without signs/symptoms of physical distress   Intensity THRR unchanged  98-133     Resistance Training   Training Prescription Yes   Weight 2 lbs   Reps 10-12     Treadmill   MPH 2.6   Grade 0.5   Minutes 15  2 min intervals (oxygen saturation dependent)   METs 3.71     Elliptical   Level 1   Speed 2.6   Minutes 15     T5 Nustep   Level 2   Watts --  60-80 spm   Minutes 15   METs 2       Goals Met:  Proper associated with RPD/PD & O2 Sat Exercise tolerated well Personal goals reviewed Strength training completed today  Goals Unmet:  Not Applicable  Comments: First full day of exercise!  Patient was oriented to gym and equipment including functions, settings, policies, and procedures.  Patient's individual exercise prescription and treatment plan were reviewed.  All starting workloads were established based on the results of the 6 minute walk test done at initial orientation visit.  The plan for exercise progression was also introduced and progression will be customized based on patient's performance and goals.    Dr. Emily Filbert is Medical Director for [place] and [facility].

## 2016-02-15 DIAGNOSIS — J8410 Pulmonary fibrosis, unspecified: Secondary | ICD-10-CM | POA: Diagnosis not present

## 2016-02-15 NOTE — Progress Notes (Signed)
Daily Session Note  Patient Details  Name: Melanie Rollins MRN: [id number] Date of Birth: [birth year] Referring Provider:   Flowsheet Row Pulmonary Rehab from 02/06/2016 in [facility] Card[facility] Rehab  Referring Provider  Wallene Huh MD      Encounter Date: 02/15/2016  Check In:     Session Check In - 02/15/16 1231      Check-In   Location [facility]-Cardiac & Pulmonary Rehab   Staff Present Heath Lark, RN, BSN, CCRP;Laureen Owens Shark, BS, RRT, Respiratory Dareen Piano, BA, ACSM CEP, Exercise Physiologist   Supervising physician immediately available to respond to emergencies [facility] immediately available ER MD   Physician(s) Alfred Levins and Joni Fears   Medication changes reported     No   Fall or balance concerns reported    No   Warm-up and Cool-down Performed as group-led Location manager Performed Yes   VAD Patient? No     Pain Assessment   Currently in Pain? No/denies         Goals Met:  Proper associated with RPD/PD & O2 Sat Independence with exercise equipment Exercise tolerated well Strength training completed today  Goals Unmet:  Not Applicable  Comments: Pt able to follow exercise prescription today without complaint.  Will continue to monitor for progression.    Dr. Emily Filbert is Medical Director for [place] and [facility].

## 2016-02-17 ENCOUNTER — Encounter: Payer: Medicare Other | Admitting: Respiratory Therapy

## 2016-02-17 DIAGNOSIS — J8410 Pulmonary fibrosis, unspecified: Secondary | ICD-10-CM

## 2016-02-17 NOTE — Progress Notes (Signed)
Daily Session Note  Patient Details  Name: Melanie Rollins MRN: [id number] Date of Birth: [birth year] Referring Provider:   Flowsheet Row Pulmonary Rehab from 02/06/2016 in [facility] Cardiac and Pulmonary Rehab  Referring Provider  Wallene Huh MD      Encounter Date: 02/17/2016  Check In:     Session Check In - 02/17/16 1032      Check-In   Location [facility]-Cardiac & Pulmonary Rehab   Staff Present Heath Lark, RN, BSN, CCRP;Margi Edmundson Blanch Media, RRT, RCP, Respiratory Therapist;Mary Kellie Shropshire, RN, BSN, MA   Supervising physician immediately available to respond to emergencies [facility] immediately available ER MD   Physician(s) Dr. Edd Fabian & Marcelene Butte   Medication changes reported     No   Fall or balance concerns reported    No   Warm-up and Cool-down Performed on first and last piece of equipment   Resistance Training Performed Yes   VAD Patient? No     Pain Assessment   Currently in Pain? No/denies         Goals Met:  Proper associated with RPD/PD & O2 Sat Independence with exercise equipment Exercise tolerated well Strength training completed today  Goals Unmet:  Not Applicable  Comments: Pt able to follow exercise prescription today without complaint.  Will continue to monitor for progression.    Dr. Emily Filbert is Medical Director for [place] and [facility].

## 2016-02-20 ENCOUNTER — Encounter: Payer: Self-pay | Admitting: Respiratory Therapy

## 2016-02-20 ENCOUNTER — Encounter: Payer: Medicare Other | Attending: Specialist | Admitting: *Deleted

## 2016-02-20 DIAGNOSIS — J8410 Pulmonary fibrosis, unspecified: Secondary | ICD-10-CM | POA: Insufficient documentation

## 2016-02-20 NOTE — Progress Notes (Signed)
Pulmonary Individual Treatment Plan ° °Patient Details  °Name: Melanie Rollins °MRN: [id number] °Date of Birth: 11/11/1945 °Referring Provider:   °Flowsheet Row Pulmonary Rehab from 02/06/2016 in [facility]  °Referring Provider  Fleming, Herbon MD  °  ° ° °Initial Encounter Date:  °Flowsheet Row Pulmonary Rehab from 02/06/2016 in [facility]  °Date  02/06/16  °Referring Provider  Fleming, Herbon MD  °  ° ° °Visit Diagnosis: Pulmonary fibrosis (HCC) ° °Patient's Home Medications on Admission: ° °Current Outpatient Prescriptions:  °•  Acetylcysteine (NAC) 600 MG CAPS, Take by mouth. Take 2 tablets daily, Disp: , Rfl:  °•  albuterol (PROVENTIL HFA;VENTOLIN HFA) 108 (90 BASE) MCG/ACT inhaler, Inhale 2 puffs into the lungs every 6 (six) hours as needed for wheezing or shortness of breath., Disp: 1 Inhaler, Rfl: 0 °•  alendronate (FOSAMAX) 70 MG tablet, Take 70 mg by mouth once a week. Take with a full glass of water on an empty stomach., Disp: , Rfl:  °•  Calcium Citrate-Vitamin D (CALCIUM + D PO), Take 600 mg by mouth daily., Disp: , Rfl:  °•  CINNAMON PO, Take 1,000 mg by mouth daily., Disp: , Rfl:  °•  conjugated estrogens (PREMARIN) vaginal cream, Place 1 Applicatorful vaginally 2 (two) times a week. Reported on 05/11/2015, Disp: 320 g, Rfl: 2 °•  hydroxychloroquine (PLAQUENIL) 200 MG tablet, Take by mouth. Take 2 tablets once a day, Disp: , Rfl:  °•  L-Lysine 500 MG TABS, Take 500 mg by mouth daily., Disp: , Rfl:  °•  lansoprazole (PREVACID) 15 MG capsule, Take 15 mg by mouth daily at 12 noon., Disp: , Rfl:  °•  Magnesium 250 MG TABS, Take by mouth daily., Disp: , Rfl:  °•  Multiple Vitamin (MULTIVITAMIN) tablet, Take 1 tablet by mouth daily., Disp: , Rfl:  °•  Omega-3 Fatty Acids (FISH OIL) 1000 MG CAPS, Take 1,000 mg by mouth daily., Disp: , Rfl:  °•  predniSONE (DELTASONE) 5 MG tablet, Take 5 mg by mouth daily with breakfast., Disp: , Rfl:  °•  sertraline (ZOLOFT) 50 MG  tablet, Take 1 tablet (50 mg total) by mouth daily. Take one-half of a pill by mouth daily for 6-8 days, then one whole pill daily, Disp: 90 tablet, Rfl: 3 ° °Past Medical History: °Past Medical History:  °Diagnosis Date  °• Arthritis   ° RA  °• Pulmonary fibrosis (HCC)   °• Reynolds syndrome (HCC)   ° ° °Tobacco Use: °History  °Smoking Status  °• Never Smoker  °Smokeless Tobacco  °• Never Used  ° ° °Labs: °Recent Review Flowsheet Data   ° Labs for [facility] Cardiac and Pulmonary Rehab Latest Ref Rng & Units 07/04/2015  ° Cholestrol 100 - 199 mg/dL 198  ° LDLCALC 0 - 99 mg/dL 91  ° HDL >39 mg/dL 79  ° Trlycerides 0 - 149 mg/dL 142  °  ° ° ° °ADL [facility]: °  °  °Pulmonary Assessment Scores   ° Row Name 02/06/16 1555  °  °  °  ° ADL [facility]  ° ADL Phase Entry    ° SOB Score total 46    ° Rest 0    ° Walk 2    ° Stairs 3    ° Bath 1    ° Dress 1    ° Shop 2    °  ° ° °Pulmonary Function Assessment: °  °  °Pulmonary Function Assessment - 02/06/16 1553   °  °   Pulmonary Function Tests  ° FVC% 80 %  ° FEV1% 98 %  ° FEV1/FVC Ratio 93.74  ° RV% 28 %  ° DLCO% 61 %  °  ° Initial Spirometry Results  ° Comments Spirometry 02/06/16; DLCO and TLC 01/03/16  °  ° Breath  ° Bilateral Breath Sounds Decreased;Rales;Basilar  ° Shortness of Breath Yes;Limiting activity  °  ° ° °Exercise Target Goals: °  ° °Exercise Program Goal: °Individual exercise prescription set with THRR, safety & activity barriers. Participant demonstrates ability to understand and report RPE using BORG scale, to self-measure pulse accurately, and to acknowledge the importance of the exercise prescription. ° °Exercise Prescription Goal: °Starting with aerobic activity 30 plus minutes a day, 3 days per week for initial exercise prescription. Provide home exercise prescription and guidelines that participant acknowledges understanding prior to discharge. ° °Activity Barriers & Risk Stratification: °  °  °Activity Barriers & Cardiac Risk Stratification - 02/06/16 1552   °  °  Activity Barriers & Cardiac Risk Stratification  ° Activity Barriers Arthritis;Shortness of Breath;Deconditioning  ° Cardiac Risk Stratification Moderate  °  ° ° °6 Minute Walk: °  °  °6 Minute Walk   ° Row Name 02/06/16 1556  °  °  °  ° 6 Minute Walk  ° Phase Initial    ° Distance 1275 feet    ° Walk Time 4.97 minutes    ° # of Rest Breaks 6  °For oxygen desaturation: 7 sec, 6 sec, 10 sec, 11 sec, 12 sec, 16 sec    ° MPH 2.92    ° METS 2.98    ° RPE 13    ° Perceived Dyspnea  4    ° VO2 Peak 10.44    ° Symptoms No    ° Resting HR 62 bpm    ° Resting BP 116/70    ° Max Ex. HR 95 bpm    ° Max Ex. BP 126/64    ° 2 Minute Post BP 126/60    °  ° Interval HR  ° Baseline HR 62    ° 1 Minute HR 70    ° 2 Minute HR 90    ° 3 Minute HR 93    ° 4 Minute HR 95    ° 5 Minute HR 90    ° 6 Minute HR 88    ° 2 Minute Post HR 62    ° Interval Heart Rate? Yes    °  ° Interval Oxygen  ° Interval Oxygen? Yes    ° Baseline Oxygen Saturation % 99 %    ° Baseline Liters of Oxygen 0 L  °Room Air    ° 1 Minute Oxygen Saturation % 78 %  °quick recovery to 94%     ° 1 Minute Liters of Oxygen 0 L    ° 2 Minute Oxygen Saturation % 80 %    ° 2 Minute Liters of Oxygen 0 L    ° 3 Minute Oxygen Saturation % 91 %  °80% at 2:44    ° 3 Minute Liters of Oxygen 0 L    ° 4 Minute Oxygen Saturation % 95 %  °77% at 3:34    ° 4 Minute Liters of Oxygen 0 L    ° 5 Minute Oxygen Saturation % 92 %  °79% at 4:27    ° 5 Minute Liters of Oxygen 0 L    ° 6 Minute Oxygen Saturation % 85 %  °81%   at 5:14     6 Minute Liters of Oxygen 0 L     2 Minute Post Oxygen Saturation % 100 %     2 Minute Post Liters of Oxygen 0 L        Initial Exercise Prescription:     Initial Exercise Prescription - 02/06/16 1500      Date of Initial Exercise RX and Referring Provider   Date 02/06/16   Referring Provider Wallene Huh MD     Treadmill   MPH 2.6   Grade 0.5   Minutes 15  2 min intervals (oxygen saturation dependent)   METs 3.71     Elliptical   Level  1   Speed 2.6   Minutes 15     T5 Nustep   Level 2   Watts --  60-80 spm   Minutes 15   METs 2     Prescription Details   Frequency (times per week) 3   Duration Progress to 45 minutes of aerobic exercise without signs/symptoms of physical distress     Intensity   THRR 40-80% of Max Heartrate 98-133   Ratings of Perceived Exertion 11-13   Perceived Dyspnea 0-4     Progression   Progression Continue to progress workloads to maintain intensity without signs/symptoms of physical distress.     Resistance Training   Training Prescription Yes   Weight 2 lbs   Reps 10-12      Perform Capillary Blood Glucose checks as needed.  Exercise Prescription Changes:     Exercise Prescription Changes    Row Name 02/13/16 1400             Response to Exercise   Blood Pressure (Admit) 100/60       Blood Pressure (Exercise) 130/70       Blood Pressure (Exit) 107/60       Heart Rate (Admit) 63 bpm       Heart Rate (Exercise) 126 bpm       Heart Rate (Exit) 73 bpm       Oxygen Saturation (Admit) 98 %       Oxygen Saturation (Exercise) 91 %       Oxygen Saturation (Exit) 98 %       Rating of Perceived Exertion (Exercise) 13       Perceived Dyspnea (Exercise) 4       Symptoms none       Duration Progress to 30 minutes of continuous aerobic without signs/symptoms of physical distress       Intensity THRR unchanged  98-133         Resistance Training   Training Prescription Yes       Weight 2 lbs       Reps 10-12         Treadmill   MPH 2.6       Grade 0.5       Minutes 15  2 min intervals (oxygen saturation dependent)       METs 3.71         Elliptical   Level 1       Speed 2.6       Minutes 15         T5 Nustep   Level 2       Watts --  60-80 spm       Minutes 15       METs 2          Exercise Comments:  Exercise Comments   ° Row Name 02/06/16 1602 02/13/16 1424 02/15/16 1232  °  °  ° Exercise Comments Barabara wants to be able to clean the house  without getting overly tired First full day of exercise!  Patient was oriented to gym and equipment including functions, settings, policies, and procedures.  Patient's individual exercise prescription and treatment plan were reviewed.  All starting workloads were established based on the results of the 6 minute walk test done at initial orientation visit.  The plan for exercise progression was also introduced and progression will be customized based on patient's performance and goals Celie has done well in her first week of exercise.    °  ° ° °Discharge Exercise Prescription (Final Exercise Prescription Changes): °  °  °Exercise Prescription Changes - 02/13/16 1400   °  ° Response to Exercise  ° Blood Pressure (Admit) 100/60  ° Blood Pressure (Exercise) 130/70  ° Blood Pressure (Exit) 107/60  ° Heart Rate (Admit) 63 bpm  ° Heart Rate (Exercise) 126 bpm  ° Heart Rate (Exit) 73 bpm  ° Oxygen Saturation (Admit) 98 %  ° Oxygen Saturation (Exercise) 91 %  ° Oxygen Saturation (Exit) 98 %  ° Rating of Perceived Exertion (Exercise) 13  ° Perceived Dyspnea (Exercise) 4  ° Symptoms none  ° Duration Progress to 30 minutes of continuous aerobic without signs/symptoms of physical distress  ° Intensity THRR unchanged  °98-133  °  ° Resistance Training  ° Training Prescription Yes  ° Weight 2 lbs  ° Reps 10-12  °  ° Treadmill  ° MPH 2.6  ° Grade 0.5  ° Minutes 15  °2 min intervals (oxygen saturation dependent)  ° METs 3.71  °  ° Elliptical  ° Level 1  ° Speed 2.6  ° Minutes 15  °  ° T5 Nustep  ° Level 2  ° Watts --  °60-80 spm  ° Minutes 15  ° METs 2  °  ° ° ° °Nutrition:  °Target Goals: Understanding of nutrition guidelines, daily intake of sodium <1500mg, cholesterol <200mg, calories 30% from fat and 7% or less from saturated fats, daily to have 5 or more servings of fruits and vegetables. ° °Biometrics: °  °  °Pre Biometrics - 02/06/16 1602   °  ° Pre Biometrics  ° Height 5' 4.7" (1.643 m)  ° Weight 141 lb 11.2 oz (64.3 kg)   ° Waist Circumference 33.25 inches  ° Hip Circumference 37.5 inches  ° Waist to Hip Ratio 0.89 %  ° BMI (Calculated) 23.8  °  ° ° ° °Nutrition Therapy Plan and Nutrition Goals: ° ° °Nutrition Discharge: Rate Your Plate Scores: ° ° °Psychosocial: °Target Goals: Acknowledge presence or absence of depression, maximize coping skills, provide positive support system. Participant is able to verbalize types and ability to use techniques and skills needed for reducing stress and depression. ° °Initial Review & Psychosocial Screening: °  °  °Initial Psych Review & Screening - 02/06/16 1601   °  ° Family Dynamics  ° Good Support System? Yes  ° Comments Ms Hofferber is very positive about dealing with her Pulmonary Fibrosis and has support from her children and husband. She is looking forward to [facility].  °  ° Barriers  ° Psychosocial barriers to participate in program The patient should benefit from training in stress management and relaxation.  °  ° Screening Interventions  ° Interventions Encouraged to exercise  °  ° ° °Quality of Life   Scores:     Quality of Life - 02/06/16 1602      Quality of Life Scores   Health/Function Pre 24.63 %   Socioeconomic Pre 21 %   Psych/Spiritual Pre 21 %   Family Pre 21 %   GLOBAL Pre 22.66 %      PHQ-9: Recent Review Flowsheet Data    Depression screen [facility] 2/9 02/06/2016 12/13/2015 07/04/2015 06/08/2015   Decreased Interest 0 0 0 0   Down, Depressed, Hopeless 0 0 0 0   PHQ - 2 Score 0 0 0 0   Altered sleeping 1 - - -   Tired, decreased energy 3 - - -   Change in appetite 0 - - -   Feeling bad or failure about yourself  0 - - -   Trouble concentrating 0 - - -   Moving slowly or fidgety/restless 0 - - -   Suicidal thoughts 0 - - -   PHQ-9 Score 4 - - -   Difficult doing work/chores Somewhat difficult - - -      Psychosocial Evaluation and Intervention:     Psychosocial Evaluation - 02/13/16 1055      Psychosocial Evaluation & Interventions   Interventions  Encouraged to exercise with the program and follow exercise prescription   Comments Counselor met with Ms. Yorke today for initial psychosocial evaluation.  She is a 70 year old who has Pulmonary Fibrosis.  She has a strong support system with a spouse of 60 years; an adult daughter and friends who live close by and she is also actively involved in her local church.  Ms. Borgwardt states her health is otherwise pretty good and she sleeps well and has a good appetite.  She reports a history of depression almost a year ago when her mother had some increased health issues.  Ms. Gerre Pebbles Dr. put her on Sertraline (50 mg) during that time and through her mother's death this past 2022/10/31, she has remained on this medication; which she reports "is working well" for her.  She states her mood is typically positive and she has minimal stress in her life - other than her health limiting her ability to do simple household chores/responsibilities.  Her goals for this program are to breathe better; to avoid oxygen; and to increase her stamina.  She will return to her Silver Sneakers class upon completion of this program.  Counselor will continue to follow with her throughout the course of Pulmonary Rehab.      Psychosocial Re-Evaluation:  Education: Education Goals: Education classes will be provided on a weekly basis, covering required topics. Participant will state understanding/return demonstration of topics presented.  Learning Barriers/Preferences:   Education Topics: Initial Evaluation Education: - Verbal, written and demonstration of respiratory meds, RPE/PD scales, oximetry and breathing techniques. Instruction on use of nebulizers and MDIs: cleaning and proper use, rinsing mouth with steroid doses and importance of monitoring MDI activations. Flowsheet Row Pulmonary Rehab from 02/13/2016 in [facility]  Date  02/06/16  Educator  LB  Instruction Review Code  2- meets goals/outcomes       General Nutrition Guidelines/Fats and Fiber: -Group instruction provided by verbal, written material, models and posters to present the general guidelines for heart healthy nutrition. Gives an explanation and review of dietary fats and fiber.   Controlling Sodium/Reading Food Labels: -Group verbal and written material supporting the discussion of sodium use in heart healthy nutrition. Review and explanation with models, verbal and written  materials for utilization of the food label.   Exercise Physiology & Risk Factors: - Group verbal and written instruction with models to review the exercise physiology of the cardiovascular system and associated critical values. Details cardiovascular disease risk factors and the goals associated with each risk factor.   Aerobic Exercise & Resistance Training: - Gives group verbal and written discussion on the health impact of inactivity. On the components of aerobic and resistive training programs and the benefits of this training and how to safely progress through these programs.   Flexibility, Balance, General Exercise Guidelines: - Provides group verbal and written instruction on the benefits of flexibility and balance training programs. Provides general exercise guidelines with specific guidelines to those with heart or lung disease. Demonstration and skill practice provided.   Stress Management: - Provides group verbal and written instruction about the health risks of elevated stress, cause of high stress, and healthy ways to reduce stress.   Depression: - Provides group verbal and written instruction on the correlation between heart/lung disease and depressed mood, treatment options, and the stigmas associated with seeking treatment.   Exercise & Equipment Safety: - Individual verbal instruction and demonstration of equipment use and safety with use of the equipment. Flowsheet Row Pulmonary Rehab from 02/13/2016 in [facility] Cardiac and  Pulmonary Rehab  Date  02/13/16  Educator  AS  Instruction Review Code  2- meets goals/outcomes      Infection Prevention: - Provides verbal and written material to individual with discussion of infection control including proper hand washing and proper equipment cleaning during exercise session. Flowsheet Row Pulmonary Rehab from 02/13/2016 in [facility]  Date  02/13/16  Educator  AS  Instruction Review Code  2- meets goals/outcomes      Falls Prevention: - Provides verbal and written material to individual with discussion of falls prevention and safety. Flowsheet Row Pulmonary Rehab from 02/13/2016 in [facility] Card[facility] Pulmonary Rehab  Date  02/06/16  Educator  LB  Instruction Review Code  2- meets goals/outcomes      Diabetes: - Individual verbal and written instruction to review signs/symptoms of diabetes, desired ranges of glucose level fasting, after meals and with exercise. Advice that pre and post exercise glucose checks will be done for 3 sessions at entry of program.   Chronic Lung Diseases: - Group verbal and written instruction to review new updates, new respiratory medications, new advancements in procedures and treatments. Provide informative websites and "800" numbers of self-education.   Lung Procedures: - Group verbal and written instruction to describe testing methods done to diagnose lung disease. Review the outcome of test results. Describe the treatment choices: Pulmonary Function Tests, ABGs and oximetry.   Energy Conservation: - Provide group verbal and written instruction for methods to conserve energy, plan and organize activities. Instruct on pacing techniques, use of adaptive equipment and posture/positioning to relieve shortness of breath.   Triggers: - Group verbal and written instruction to review types of environmental controls: home humidity, furnaces, filters, dust mite/pet prevention, HEPA vacuums. To discuss weather  changes, air quality and the benefits of nasal washing.   Exacerbations: - Group verbal and written instruction to provide: warning signs, infection symptoms, calling MD promptly, preventive modes, and value of vaccinations. Review: effective airway clearance, coughing and/or vibration techniques. Create an Sports administrator.   Oxygen: - Individual and group verbal and written instruction on oxygen therapy. Includes supplement oxygen, available portable oxygen systems, continuous and intermittent flow rates, oxygen safety, concentrators, and  Medicare reimbursement for oxygen. ° ° °Respiratory Medications: °- Group verbal and written instruction to review medications for lung disease. Drug class, frequency, complications, importance of spacers, rinsing mouth after steroid MDI's, and proper cleaning methods for nebulizers. ° ° °AED/CPR: °- Group verbal and written instruction with the use of models to demonstrate the basic use of the AED with the basic ABC's of resuscitation. ° ° °Breathing Retraining: °- Provides individuals verbal and written instruction on purpose, frequency, and proper technique of diaphragmatic breathing and pursed-lipped breathing. Applies individual practice skills. °Flowsheet Row Pulmonary Rehab from 02/13/2016 in [facility] Cardiac and Pulmonary Rehab  °Date  02/13/16  °Educator  AS  °Instruction Review Code  2- meets goals/outcomes  °  ° ° °Anatomy and Physiology of the Lungs: °- Group verbal and written instruction with the use of models to provide basic lung anatomy and physiology related to function, structure and complications of lung disease. ° ° °Heart Failure: °- Group verbal and written instruction on the basics of heart failure: signs/symptoms, treatments, explanation of ejection fraction, enlarged heart and cardiomyopathy. ° ° °Sleep Apnea: °- Individual verbal and written instruction to review Obstructive Sleep Apnea. Review of risk factors, methods for diagnosing and types of masks  and machines for OSA. ° ° °Anxiety: °- Provides group, verbal and written instruction on the correlation between heart/lung disease and anxiety, treatment options, and management of anxiety. ° ° °Relaxation: °- Provides group, verbal and written instruction about the benefits of relaxation for patients with heart/lung disease. Also provides patients with examples of relaxation techniques. ° ° °Knowledge Questionnaire Score: °  °  °Knowledge Questionnaire Score - 02/06/16 1552   °  ° Knowledge Questionnaire Score  ° Pre Score 9/10  °  °  ° °Core Components/Risk Factors/Patient Goals at Admission: °  °  °Personal Goals and Risk Factors at Admission - 02/06/16 1558   °  ° Core Components/Risk Factors/Patient Goals on Admission  °  Weight Management Yes  ° Intervention Weight Management: Develop a combined nutrition and exercise program designed to reach desired caloric intake, while maintaining appropriate intake of nutrient and fiber, sodium and fats, and appropriate energy expenditure required for the weight goal.;Weight Management: Provide education and appropriate resources to help participant work on and attain dietary goals.  ° Admit Weight 141 lb 11.2 oz (64.3 kg)  ° Goal Weight: Short Term 138 lb (62.6 kg)  ° Goal Weight: Long Term 125 lb (56.7 kg)  ° Expected Outcomes Short Term: Continue to assess and modify interventions until short term weight is achieved;Long Term: Adherence to nutrition and physical activity/exercise program aimed toward attainment of established weight goal;Weight Maintenance: Understanding of the daily nutrition guidelines, which includes 25-35% calories from fat, 7% or less cal from saturated fats, less than 200mg cholesterol, less than 1.5gm of sodium, & 5 or more servings of fruits and vegetables daily  ° Sedentary Yes  ° Intervention Provide advice, education, support and counseling about physical activity/exercise needs.;Develop an individualized exercise prescription for aerobic  and resistive training based on initial evaluation findings, risk stratification, comorbidities and participant's personal goals.  °Home Treadmill and Silver Sneaker Classes  ° Expected Outcomes Achievement of increased cardiorespiratory fitness and enhanced flexibility, muscular endurance and strength shown through measurements of functional capacity and personal statement of participant.  ° Increase Strength and Stamina Yes  ° Intervention Provide advice, education, support and counseling about physical activity/exercise needs.;Develop an individualized exercise prescription for aerobic and resistive training based on initial evaluation   findings, risk stratification, comorbidities and participant's personal goals.   Expected Outcomes Achievement of increased cardiorespiratory fitness and enhanced flexibility, muscular endurance and strength shown through measurements of functional capacity and personal statement of participant.   Improve shortness of breath with ADL's Yes   Intervention Provide education, individualized exercise plan and daily activity instruction to help decrease symptoms of SOB with activities of daily living.  Complete housework with less shortness of breath   Expected Outcomes Short Term: Achieves a reduction of symptoms when performing activities of daily living.   Develop more efficient breathing techniques such as purse lipped breathing and diaphragmatic breathing; and practicing self-pacing with activity Yes   Intervention Provide education, demonstration and support about specific breathing techniuqes utilized for more efficient breathing. Include techniques such as pursed lipped breathing, diaphragmatic breathing and self-pacing activity.   Expected Outcomes Short Term: Participant will be able to demonstrate and use breathing techniques as needed throughout daily activities.      Core Components/Risk Factors/Patient Goals Review:      Goals and Risk Factor Review    Row  Name 02/13/16 1419             Core Components/Risk Factors/Patient Goals Review   Personal Goals Review Develop more efficient breathing techniques such as purse lipped breathing and diaphragmatic breathing and practicing self-pacing with activity.       Review Pursed lip breathing techniques were discussed with the patient. He patient demonstrated understanding of these techniques.        Expected Outcomes Patient will use pursed lip breathing during exercise and daily activities to control shortness of breath.           Core Components/Risk Factors/Patient Goals at Discharge (Final Review):      Goals and Risk Factor Review - 02/13/16 1419      Core Components/Risk Factors/Patient Goals Review   Personal Goals Review Develop more efficient breathing techniques such as purse lipped breathing and diaphragmatic breathing and practicing self-pacing with activity.   Review Pursed lip breathing techniques were discussed with the patient. He patient demonstrated understanding of these techniques.    Expected Outcomes Patient will use pursed lip breathing during exercise and daily activities to control shortness of breath.       ITP Comments:     ITP Comments    Row Name 02/15/16 1233           ITP Comments Daijha has done well in her first week of exercise.          Comments: 30 day note review

## 2016-02-20 NOTE — Progress Notes (Signed)
Daily Session Note  Patient Details  Name: Melanie Rollins MRN: [id number] Date of Birth: 1946-01-21 Referring Provider:   Flowsheet Row Pulmonary Rehab from 02/06/2016 in [facility] Cardiac and Pulmonary Rehab  Referring Provider  Wallene Huh MD      Encounter Date: 02/20/2016  Check In:     Session Check In - 02/20/16 1015      Check-In   Location [facility]-Cardiac & Pulmonary Rehab   Staff Present Carson Myrtle, BS, RRT, Respiratory Bertis Ruddy, BS, ACSM CEP, Exercise Physiologist;Amanda Oletta Darter, BA, ACSM CEP, Exercise Physiologist   Supervising physician immediately available to respond to emergencies [facility] immediately available ER MD   Physician(s) Dr. Jacqualine Code and Dr. Reita Cliche   Fall or balance concerns reported    No   Warm-up and Cool-down Performed on first and last piece of equipment   Resistance Training Performed Yes   VAD Patient? No     Pain Assessment   Currently in Pain? No/denies   Multiple Pain Sites No         Goals Met:  Proper associated with RPD/PD & O2 Sat Independence with exercise equipment Exercise tolerated well Strength training completed today  Goals Unmet:  Not Applicable  Comments: Pt able to follow exercise prescription today without complaint.  Will continue to monitor for progression.    Dr. Emily Filbert is Medical Director for [place] and [facility].

## 2016-02-22 DIAGNOSIS — J8410 Pulmonary fibrosis, unspecified: Secondary | ICD-10-CM

## 2016-02-22 NOTE — Progress Notes (Signed)
Daily Session Note  Patient Details  Name: Melanie Rollins MRN: [id number] Date of Birth: March 16, 1946 Referring Provider:   Flowsheet Row Pulmonary Rehab from 02/06/2016 in [facility] Cardiac and Pulmonary Rehab  Referring Provider  Wallene Huh MD      Encounter Date: 02/22/2016  Check In:     Session Check In - 02/22/16 1152      Check-In   Location [facility]-Cardiac & Pulmonary Rehab   Staff Present Carson Myrtle, BS, RRT, Respiratory Lennie Hummer, MA, ACSM RCEP, Exercise Physiologist;Amanda Oletta Darter, BA, ACSM CEP, Exercise Physiologist   Supervising physician immediately available to respond to emergencies [facility] immediately available ER MD   Physician(s) Edd Fabian  and Corky Downs   Medication changes reported     No   Fall or balance concerns reported    No   Warm-up and Cool-down Performed as group-led Location manager Performed Yes   VAD Patient? No     Pain Assessment   Currently in Pain? No/denies         Goals Met:  Proper associated with RPD/PD & O2 Sat Independence with exercise equipment Exercise tolerated well Strength training completed today  Goals Unmet:  Not Applicable  Comments: Pt able to follow exercise prescription today without complaint.  Will continue to monitor for progression.    Dr. Emily Filbert is Medical Director for [place] and [facility].

## 2016-02-24 ENCOUNTER — Encounter: Payer: Medicare Other | Admitting: *Deleted

## 2016-02-24 DIAGNOSIS — J8410 Pulmonary fibrosis, unspecified: Secondary | ICD-10-CM

## 2016-02-24 NOTE — Progress Notes (Signed)
Daily Session Note  Patient Details  Name: Melanie Rollins MRN: [id number] Date of Birth: 04-07-1946 Referring Provider:   Flowsheet Row Pulmonary Rehab from 02/06/2016 in [facility] Cardiac and Pulmonary Rehab  Referring Provider  Wallene Huh MD      Encounter Date: 02/24/2016  Check In:     Session Check In - 02/24/16 1039      Check-In   Location [facility]-Cardiac & Pulmonary Rehab   Staff Present Carson Myrtle, BS, RRT, Respiratory Therapist;Nachmen Mansel, RN, Levie Heritage, MA, ACSM RCEP, Exercise Physiologist   Supervising physician immediately available to respond to emergencies [facility] immediately available ER MD   Physician(s) Dr. Izola Price and Dr. Reita Cliche   Medication changes reported     No   Fall or balance concerns reported    No   Warm-up and Cool-down Performed as group-led instruction   Resistance Training Performed Yes   VAD Patient? No     Pain Assessment   Currently in Pain? No/denies         Goals Met:  Proper associated with RPD/PD & O2 Sat Exercise tolerated well  Goals Unmet:  Not Applicable  Comments:     Dr. Emily Filbert is Medical Director for [place] and [facility].

## 2016-02-27 ENCOUNTER — Encounter: Payer: Medicare Other | Admitting: *Deleted

## 2016-02-27 DIAGNOSIS — J8410 Pulmonary fibrosis, unspecified: Secondary | ICD-10-CM | POA: Diagnosis not present

## 2016-02-27 NOTE — Progress Notes (Signed)
Daily Session Note  Patient Details  Name: Melanie Rollins MRN: [id number] Date of Birth: 1946-05-04 Referring Provider:   Flowsheet Row Pulmonary Rehab from 02/06/2016 in [facility] Cardiac and Pulmonary Rehab  Referring Provider  Wallene Huh MD      Encounter Date: 02/27/2016  Check In:     Session Check In - 02/27/16 1012      Check-In   Location [facility]-Cardiac & Pulmonary Rehab   Staff Present Carson Myrtle, BS, RRT, Respiratory Therapist;Kaisyn Millea Amedeo Plenty, BS, ACSM CEP, Exercise Physiologist;Amanda Oletta Darter, BA, ACSM CEP, Exercise Physiologist   Supervising physician immediately available to respond to emergencies [facility] immediately available ER MD   Physician(s) Dr. Burlene Arnt and Dr. Marcelene Butte   Medication changes reported     No   Fall or balance concerns reported    No   Warm-up and Cool-down Performed on first and last piece of equipment   Resistance Training Performed Yes   VAD Patient? No     Pain Assessment   Currently in Pain? No/denies   Multiple Pain Sites No         Goals Met:  Proper associated with RPD/PD & O2 Sat Independence with exercise equipment Exercise tolerated well Strength training completed today  Goals Unmet:  Not Applicable  Comments: Pt able to follow exercise prescription today without complaint.  Will continue to monitor for progression.    Dr. Emily Filbert is Medical Director for [place] and [facility].

## 2016-02-29 ENCOUNTER — Encounter: Payer: Medicare Other | Admitting: *Deleted

## 2016-02-29 DIAGNOSIS — J8410 Pulmonary fibrosis, unspecified: Secondary | ICD-10-CM | POA: Diagnosis not present

## 2016-02-29 NOTE — Progress Notes (Signed)
Daily Session Note  Patient Details  Name: Melanie Rollins MRN: [id number] Date of Birth: Jun 06, 1945 Referring Provider:   Flowsheet Row Pulmonary Rehab from 02/06/2016 in [facility] Cardiac and Pulmonary Rehab  Referring Provider  Wallene Huh MD      Encounter Date: 02/29/2016  Check In:     Session Check In - 02/29/16 1144      Check-In   Location [facility]-Cardiac & Pulmonary Rehab   Staff Present Gerlene Burdock, RN, BSN;Laureen Owens Shark, BS, RRT, Respiratory Lennie Hummer, MA, ACSM RCEP, Exercise Physiologist   Supervising physician immediately available to respond to emergencies [facility] immediately available ER MD   Physician(s) Dr. Reita Cliche and Dr. Quentin Cornwall   Medication changes reported     No   Fall or balance concerns reported    No   Warm-up and Cool-down Performed as group-led instruction   Resistance Training Performed Yes   VAD Patient? No     Pain Assessment   Currently in Pain? No/denies         Goals Met:  Proper associated with RPD/PD & O2 Sat Exercise tolerated well  Goals Unmet:  Not Applicable  Comments:     Dr. Emily Filbert is Medical Director for [place] and [facility].

## 2016-02-29 NOTE — Progress Notes (Signed)
Daily Session Note  Patient Details  Name: Melanie Rollins MRN: [id number] Date of Birth: 1945-09-07 Referring Provider:   Flowsheet Row Pulmonary Rehab from 02/06/2016 in [facility] Cardiac and Pulmonary Rehab  Referring Provider  Wallene Huh MD      Encounter Date: 02/29/2016  Check In:     Session Check In - 02/29/16 1049      Check-In   Location [facility]Cardiac & Pulmonary Rehab   Staff Present Alberteen Sam, MA, ACSM RCEP, Exercise Physiologist;Carroll Enterkin, RN, BSN;Laureen Owens Shark, BS, RRT, Respiratory Therapist   Supervising physician immediately available to respond to emergencies See telemetry face sheet for immediately available ER MD   Physician(s) Drs. Lord and [employer]   Medication changes reported     No   Fall or balance concerns reported    No   Warm-up and Cool-down Performed as group-led Location manager Performed Yes   VAD Patient? No     Pain Assessment   Currently in Pain? No/denies         Goals Met:  Proper associated with RPD/PD & O2 Sat Independence with exercise equipment Using PLB without cueing & demonstrates good technique Exercise tolerated well Strength training completed today  Goals Unmet:  Not Applicable  Comments: Pt able to follow exercise prescription today without complaint.  Will continue to monitor for progression.    Dr. Emily Filbert is Medical Director for [place] and [facility].

## 2016-03-02 DIAGNOSIS — J8410 Pulmonary fibrosis, unspecified: Secondary | ICD-10-CM

## 2016-03-02 NOTE — Progress Notes (Signed)
Daily Session Note  Patient Details  Name: Melanie Rollins MRN: [id number] Date of Birth: 1945/09/25 Referring Provider:   Flowsheet Row Pulmonary Rehab from 02/06/2016 in [facility] Cardiac and Pulmonary Rehab  Referring Provider  Wallene Huh MD      Encounter Date: 03/02/2016  Check In:     Session Check In - 03/02/16 1209      Check-In   Location [facility]-Cardiac & Pulmonary Rehab   Staff Present Heath Lark, RN, BSN, CCRP;Carroll Enterkin, RN, Vickki Hearing, BA, ACSM CEP, Exercise Physiologist   Supervising physician immediately available to respond to emergencies [facility] immediately available ER MD   Physician(s) Corky Downs and Archie Balboa   Medication changes reported     No   Fall or balance concerns reported    No   Warm-up and Cool-down Performed as group-led Location manager Performed Yes   VAD Patient? No     Pain Assessment   Currently in Pain? No/denies         Goals Met:  Proper associated with RPD/PD & O2 Sat Independence with exercise equipment Exercise tolerated well Strength training completed today  Goals Unmet:  Not Applicable  Comments: Pt able to follow exercise prescription today without complaint.  Will continue to monitor for progression.    Dr. Emily Filbert is Medical Director for [place] and [facility].

## 2016-03-05 ENCOUNTER — Encounter: Payer: Medicare Other | Admitting: *Deleted

## 2016-03-05 DIAGNOSIS — J8410 Pulmonary fibrosis, unspecified: Secondary | ICD-10-CM | POA: Diagnosis not present

## 2016-03-05 NOTE — Progress Notes (Signed)
Daily Session Note  Patient Details  Name: LUISANA LUTZKE MRN: [id number] Date of Birth: 08-Aug-1945 Referring Provider:   Flowsheet Row Pulmonary Rehab from 02/06/2016 in [facility] Cardiac and Pulmonary Rehab  Referring Provider  Wallene Huh MD      Encounter Date: 03/05/2016  Check In:     Session Check In - 03/05/16 1014      Check-In   Location [facility]-Cardiac & Pulmonary Rehab   Staff Present Carson Myrtle, BS, RRT, Respiratory Therapist;Hermenia Fritcher Amedeo Plenty, BS, ACSM CEP, Exercise Physiologist;Amanda Oletta Darter, BA, ACSM CEP, Exercise Physiologist   Supervising physician immediately available to respond to emergencies [facility] immediately available ER MD   Physician(s) Joni Fears and Jimmye Norman   Medication changes reported     No   Fall or balance concerns reported    No   Warm-up and Cool-down Performed on first and last piece of equipment   Resistance Training Performed Yes   VAD Patient? No     Pain Assessment   Currently in Pain? No/denies   Multiple Pain Sites No         Goals Met:  Proper associated with RPD/PD & O2 Sat Independence with exercise equipment Exercise tolerated well Strength training completed today  Goals Unmet:  Not Applicable  Comments: Pt able to follow exercise prescription today without complaint.  Will continue to monitor for progression.    Dr. Emily Filbert is Medical Director for [place] and [facility].

## 2016-03-07 DIAGNOSIS — J8410 Pulmonary fibrosis, unspecified: Secondary | ICD-10-CM | POA: Diagnosis not present

## 2016-03-07 NOTE — Progress Notes (Signed)
Daily Session Note  Patient Details  Name: Melanie Rollins MRN: [id number] Date of Birth: Dec 12, 1945 Referring Provider:   Flowsheet Row Pulmonary Rehab from 02/06/2016 in [facility] Cardiac and Pulmonary Rehab  Referring Provider  Wallene Huh MD      Encounter Date: 03/07/2016  Check In:     Session Check In - 03/07/16 1146      Check-In   Location [facility]-Cardiac & Pulmonary Rehab   Staff Present Carson Myrtle, BS, RRT, Respiratory Lennie Hummer, MA, ACSM RCEP, Exercise Physiologist;Starasia Sinko Oletta Darter, BA, ACSM CEP, Exercise Physiologist   Supervising physician immediately available to respond to emergencies [facility] immediately available ER MD   Physician(s) Quentin Cornwall and Joni Fears   Medication changes reported     No   Fall or balance concerns reported    No   Warm-up and Cool-down Performed as group-led Location manager Performed Yes   VAD Patient? No     Pain Assessment   Currently in Pain? No/denies         Goals Met:  Proper associated with RPD/PD & O2 Sat Independence with exercise equipment Exercise tolerated well Strength training completed today  Goals Unmet:  Not Applicable  Comments: Pt able to follow exercise prescription today without complaint.  Will continue to monitor for progression.     Dr. Emily Filbert is Medical Director for [place] and [facility].

## 2016-03-09 DIAGNOSIS — Z23 Encounter for immunization: Secondary | ICD-10-CM | POA: Diagnosis not present

## 2016-03-12 ENCOUNTER — Encounter: Payer: Medicare Other | Admitting: *Deleted

## 2016-03-12 DIAGNOSIS — J8410 Pulmonary fibrosis, unspecified: Secondary | ICD-10-CM | POA: Diagnosis not present

## 2016-03-12 NOTE — Progress Notes (Signed)
Daily Session Note  Patient Details  Name: RINOA GARRAMONE MRN: [id number] Date of Birth: [birth year] Referring Provider:   Flowsheet Row Pulmonary Rehab from 02/06/2016 in [facility] Cardiac and Pulmonary Rehab  Referring Provider  Wallene Huh MD      Encounter Date: 03/12/2016  Check In:     Session Check In - 03/12/16 1123      Check-In   Location [facility]-Cardiac & Pulmonary Rehab   Staff Present Carson Myrtle, BS, RRT, Respiratory Therapist;Lexus Barletta Amedeo Plenty, BS, ACSM CEP, Exercise Physiologist;Amanda Oletta Darter, BA, ACSM CEP, Exercise Physiologist   Supervising physician immediately available to respond to emergencies [facility] immediately available ER MD   Physician(s) Drs. Paduchow and [place]   Medication changes reported     No   Fall or balance concerns reported    No   Warm-up and Cool-down Performed on first and last piece of equipment   Resistance Training Performed Yes   VAD Patient? No     Pain Assessment   Currently in Pain? No/denies   Multiple Pain Sites No         Goals Met:  Proper associated with RPD/PD & O2 Sat Independence with exercise equipment Exercise tolerated well Strength training completed today  Goals Unmet:  Not Applicable  Comments: Pt able to follow exercise prescription today without complaint.  Will continue to monitor for progression.    Dr. Emily Filbert is Medical Director for [place] and [facility].

## 2016-03-14 DIAGNOSIS — J8410 Pulmonary fibrosis, unspecified: Secondary | ICD-10-CM | POA: Diagnosis not present

## 2016-03-14 NOTE — Progress Notes (Signed)
Daily Session Note  Patient Details  Name: Melanie Rollins MRN: [id number] Date of Birth: 03-Jun-1945 Referring Provider:   Flowsheet Row Pulmonary Rehab from 02/06/2016 in [facility] Cardiac and Pulmonary Rehab  Referring Provider  Wallene Huh MD      Encounter Date: 03/14/2016  Check In:     Session Check In - 03/14/16 1251      Check-In   Location [facility]-Cardiac & Pulmonary Rehab   Staff Present Carson Myrtle, BS, RRT, Respiratory Lennie Hummer, MA, ACSM RCEP, Exercise Physiologist;Amanda Oletta Darter, BA, ACSM CEP, Exercise Physiologist   Supervising physician immediately available to respond to emergencies [facility] immediately available ER MD   Physician(s) Joni Fears and Jimmye Norman   Medication changes reported     No   Fall or balance concerns reported    No   Warm-up and Cool-down Performed as group-led Location manager Performed Yes   VAD Patient? No     Pain Assessment   Currently in Pain? No/denies         Goals Met:  Proper associated with RPD/PD & O2 Sat Independence with exercise equipment Exercise tolerated well Strength training completed today  Goals Unmet:  Not Applicable  Comments: Pt able to follow exercise prescription today without complaint.  Will continue to monitor for progression.    Dr. Emily Filbert is Medical Director for [place] and [facility].

## 2016-03-16 ENCOUNTER — Encounter: Payer: Medicare Other | Admitting: *Deleted

## 2016-03-16 DIAGNOSIS — J8410 Pulmonary fibrosis, unspecified: Secondary | ICD-10-CM

## 2016-03-16 NOTE — Progress Notes (Signed)
Daily Session Note  Patient Details  Name: Melanie Rollins MRN: [id number] Date of Birth: Jul 09, 1945 Referring Provider:   Flowsheet Row Pulmonary Rehab from 02/06/2016 in [facility] Cardiac and Pulmonary Rehab  Referring Provider  Wallene Huh MD      Encounter Date: 03/16/2016  Check In:     Session Check In - 03/16/16 1058      Check-In   Location [facility]-Cardiac & Pulmonary Rehab   Staff Present Carson Myrtle, BS, RRT, Respiratory Therapist;Akira Adelsberger, RN, Vickki Hearing, BA, ACSM CEP, Exercise Physiologist   Supervising physician immediately available to respond to emergencies [facility] immediately available ER MD   Physician(s) Dr. Corky Downs and Dr. Alfred Levins   Medication changes reported     No   Fall or balance concerns reported    No   Warm-up and Cool-down Performed on first and last piece of equipment   Resistance Training Performed Yes   VAD Patient? No     Pain Assessment   Currently in Pain? No/denies         Goals Met:  Proper associated with RPD/PD & O2 Sat Exercise tolerated well  Goals Unmet:  Not Applicable  Comments:     Dr. Emily Filbert is Medical Director for [place] and [facility].

## 2016-03-19 ENCOUNTER — Encounter: Payer: Self-pay | Admitting: *Deleted

## 2016-03-19 ENCOUNTER — Encounter: Payer: Medicare Other | Admitting: *Deleted

## 2016-03-19 DIAGNOSIS — J8410 Pulmonary fibrosis, unspecified: Secondary | ICD-10-CM

## 2016-03-19 NOTE — Progress Notes (Signed)
Daily Session Note  Patient Details  Name: Melanie Rollins MRN: [id number] Date of Birth: 22-May-1945 Referring Provider:   Flowsheet Row Pulmonary Rehab from 02/06/2016 in [facility] Cardiac and Pulmonary Rehab  Referring Provider  Wallene Huh MD      Encounter Date: 03/19/2016  Check In:     Session Check In - 03/19/16 1249      Check-In   Location [facility]-Cardiac & Pulmonary Rehab   Staff Present Gerlene Burdock, RN, BSN;Laureen Owens Shark, BS, RRT, Respiratory Therapist;Mattheus Rauls Amedeo Plenty, BS, ACSM CEP, Exercise Physiologist   Supervising physician immediately available to respond to emergencies [facility] immediately available ER MD   Physician(s) Drs. [place] and [place]   Medication changes reported     No   Fall or balance concerns reported    No   Warm-up and Cool-down Performed on first and last piece of equipment   Resistance Training Performed Yes   VAD Patient? No     Pain Assessment   Currently in Pain? No/denies   Multiple Pain Sites No         Goals Met:  Proper associated with RPD/PD & O2 Sat Independence with exercise equipment Exercise tolerated well Strength training completed today  Goals Unmet:  Not Applicable  Comments: Pt able to follow exercise prescription today without complaint.  Will continue to monitor for progression.    Dr. Emily Filbert is Medical Director for [place] and [facility].

## 2016-03-19 NOTE — Progress Notes (Signed)
Pulmonary Individual Treatment Plan  Patient Details  Name: Melanie Rollins MRN: [id number] Date of Birth: Sep 13, 1945 Referring Provider:   Flowsheet Row Pulmonary Rehab from 02/06/2016 in [facility] Cardiac and Pulmonary Rehab  Referring Provider  Wallene Huh MD      Initial Encounter Date:  Flowsheet Row Pulmonary Rehab from 02/06/2016 in [facility] Cardiac and Pulmonary Rehab  Date  02/06/16  Referring Provider  Wallene Huh MD      Visit Diagnosis: Pulmonary fibrosis [facility])  Patient's Home Medications on Admission:  Current Outpatient Prescriptions:  .  Acetylcysteine (NAC) 600 MG CAPS, Take by mouth. Take 2 tablets daily, Disp: , Rfl:  .  albuterol (PROVENTIL HFA;VENTOLIN HFA) 108 (90 BASE) MCG/ACT inhaler, Inhale 2 puffs into the lungs every 6 (six) hours as needed for wheezing or shortness of breath., Disp: 1 Inhaler, Rfl: 0 .  alendronate (FOSAMAX) 70 MG tablet, Take 70 mg by mouth once a week. Take with a full glass of water on an empty stomach., Disp: , Rfl:  .  Calcium Citrate-Vitamin D (CALCIUM + D PO), Take 600 mg by mouth daily., Disp: , Rfl:  .  CINNAMON PO, Take 1,000 mg by mouth daily., Disp: , Rfl:  .  conjugated estrogens (PREMARIN) vaginal cream, Place 1 Applicatorful vaginally 2 (two) times a week. Reported on 05/11/2015, Disp: 320 g, Rfl: 2 .  hydroxychloroquine (PLAQUENIL) 200 MG tablet, Take by mouth. Take 2 tablets once a day, Disp: , Rfl:  .  L-Lysine 500 MG TABS, Take 500 mg by mouth daily., Disp: , Rfl:  .  lansoprazole (PREVACID) 15 MG capsule, Take 15 mg by mouth daily at 12 noon., Disp: , Rfl:  .  Magnesium 250 MG TABS, Take by mouth daily., Disp: , Rfl:  .  Multiple Vitamin (MULTIVITAMIN) tablet, Take 1 tablet by mouth daily., Disp: , Rfl:  .  Omega-3 Fatty Acids (FISH OIL) 1000 MG CAPS, Take 1,000 mg by mouth daily., Disp: , Rfl:  .  predniSONE (DELTASONE) 5 MG tablet, Take 5 mg by mouth daily with breakfast., Disp: , Rfl:  .  sertraline (ZOLOFT) 50 MG  tablet, Take 1 tablet (50 mg total) by mouth daily. Take one-half of a pill by mouth daily for 6-8 days, then one whole pill daily, Disp: 90 tablet, Rfl: 3  Past Medical History: Past Medical History:  Diagnosis Date  . Arthritis    RA  . Pulmonary fibrosis ([place])   . 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 Never Smoker  Smokeless Tobacco  . Never Used    Labs: Recent Review Flowsheet Data    Labs for ITP Cardiac and Pulmonary Rehab Latest Ref Rng & Units 07/04/2015   Cholestrol 100 - 199 mg/dL 198   LDLCALC 0 - 99 mg/dL 91   HDL >39 mg/dL 79   Trlycerides 0 - 149 mg/dL 142       ADL UCSD:     Pulmonary Assessment Scores    Row Name 02/06/16 1555         ADL UCSD   ADL Phase Entry     SOB Score total 46     Rest 0     Walk 2     Stairs 3     Bath 1     Dress 1     Shop 2        Pulmonary Function Assessment:     Pulmonary Function Assessment - 02/06/16 1553  Pulmonary Function Tests   FVC% 80 %   FEV1% 98 %   FEV1/FVC Ratio 93.74   RV% 28 %   DLCO% 61 %     Initial Spirometry Results   Comments Spirometry 02/06/16; DLCO and TLC 01/03/16     Breath   Bilateral Breath Sounds Decreased;Rales;Basilar   Shortness of Breath Yes;Limiting activity      Exercise Target Goals:    Exercise Program Goal: Individual exercise prescription set with THRR, safety & activity barriers. Participant demonstrates ability to understand and report RPE using BORG scale, to self-measure pulse accurately, and to acknowledge the importance of the exercise prescription.  Exercise Prescription Goal: Starting with aerobic activity 30 plus minutes a day, 3 days per week for initial exercise prescription. Provide home exercise prescription and guidelines that participant acknowledges understanding prior to discharge.  Activity Barriers & Risk Stratification:     Activity Barriers & Cardiac Risk Stratification - 02/06/16 1552       Activity Barriers & Cardiac Risk Stratification   Activity Barriers Arthritis;Shortness of Breath;Deconditioning   Cardiac Risk Stratification Moderate      6 Minute Walk:     6 Minute Walk    Row Name 02/06/16 1556         6 Minute Walk   Phase Initial     Distance 1275 feet     Walk Time 4.97 minutes     # of Rest Breaks 6  For oxygen desaturation: 7 sec, 6 sec, 10 sec, 11 sec, 12 sec, 16 sec     MPH 2.92     METS 2.98     RPE 13     Perceived Dyspnea  4     VO2 Peak 10.44     Symptoms No     Resting HR 62 bpm     Resting BP 116/70     Max Ex. HR 95 bpm     Max Ex. BP 126/64     2 Minute Post BP 126/60       Interval HR   Baseline HR 62     1 Minute HR 70     2 Minute HR 90     3 Minute HR 93     4 Minute HR 95     5 Minute HR 90     6 Minute HR 88     2 Minute Post HR 62     Interval Heart Rate? Yes       Interval Oxygen   Interval Oxygen? Yes     Baseline Oxygen Saturation % 99 %     Baseline Liters of Oxygen 0 L  Room Air     1 Minute Oxygen Saturation % 78 %  quick recovery to 94%      1 Minute Liters of Oxygen 0 L     2 Minute Oxygen Saturation % 80 %     2 Minute Liters of Oxygen 0 L     3 Minute Oxygen Saturation % 91 %  80% at 2:44     3 Minute Liters of Oxygen 0 L     4 Minute Oxygen Saturation % 95 %  77% at 3:34     4 Minute Liters of Oxygen 0 L     5 Minute Oxygen Saturation % 92 %  79% at 4:27     5 Minute Liters of Oxygen 0 L     6 Minute Oxygen Saturation % 85 %  81%  at 5:14     6 Minute Liters of Oxygen 0 L     2 Minute Post Oxygen Saturation % 100 %     2 Minute Post Liters of Oxygen 0 L        Initial Exercise Prescription:     Initial Exercise Prescription - 02/06/16 1500      Date of Initial Exercise RX and Referring Provider   Date 02/06/16   Referring Provider Wallene Huh MD     Treadmill   MPH 2.6   Grade 0.5   Minutes 15  2 min intervals (oxygen saturation dependent)   METs 3.71     Elliptical   Level  1   Speed 2.6   Minutes 15     T5 Nustep   Level 2   Watts --  60-80 spm   Minutes 15   METs 2     Prescription Details   Frequency (times per week) 3   Duration Progress to 45 minutes of aerobic exercise without signs/symptoms of physical distress     Intensity   THRR 40-80% of Max Heartrate 98-133   Ratings of Perceived Exertion 11-13   Perceived Dyspnea 0-4     Progression   Progression Continue to progress workloads to maintain intensity without signs/symptoms of physical distress.     Resistance Training   Training Prescription Yes   Weight 2 lbs   Reps 10-12      Perform Capillary Blood Glucose checks as needed.  Exercise Prescription Changes:     Exercise Prescription Changes    Row Name 02/13/16 1400 02/22/16 1200 03/01/16 1200 03/15/16 1000       Exercise Review   Progression  -  - Yes Yes      Response to Exercise   Blood Pressure (Admit) 100/60  - 110/58 138/62    Blood Pressure (Exercise) 130/70  - 128/64 124/56    Blood Pressure (Exit) 107/60  - 124/60 114/63    Heart Rate (Admit) 63 bpm  - 74 bpm 82 bpm    Heart Rate (Exercise) 126 bpm  - 132 bpm 125 bpm    Heart Rate (Exit) 73 bpm  - 76 bpm 89 bpm    Oxygen Saturation (Admit) 98 %  - 96 % 99 %    Oxygen Saturation (Exercise) 91 %  - 89 % 90 %    Oxygen Saturation (Exit) 98 %  - 99 % 96 %    Rating of Perceived Exertion (Exercise) 13  - 15 14    Perceived Dyspnea (Exercise) 4  - 4 4    Symptoms none  - one none    Duration Progress to 30 minutes of continuous aerobic without signs/symptoms of physical distress  - Progress to 45 minutes of aerobic exercise without signs/symptoms of physical distress Progress to 45 minutes of aerobic exercise without signs/symptoms of physical distress    Intensity THRR unchanged  98-133  - THRR unchanged THRR unchanged      Progression   Progression  -  -  - Continue to progress workloads to maintain intensity without signs/symptoms of physical distress.       Resistance Training   Training Prescription Yes  - Yes Yes    Weight 2 lbs  - 2 2    Reps 10-12  - 10-15 10-12      Interval Training   Interval Training  -  - No No      Treadmill   MPH  2.6  - 2.8 2.8    Grade 0.5  - 0.5 0.5    Minutes 15  2 min intervals (oxygen saturation dependent)  - 15 15    METs 3.71  - 3.34 3.34      Elliptical   Level 1  - 1 1    Speed 2.6  - 2.6 2.5    Minutes 15  - 15 15      T5 Nustep   Level 2  - 2 2    Watts -  60-80 spm  -  -  -    Minutes 15  - 15 15    METs 2  - 2  -      Home Exercise Plan   Plans to continue exercise at  - [place] 2 additional days to program exercise sessions.  [employer]ne[employer] classes  -  -       Exercise Comments:     Exercise Comments    Row Name 02/06/16 1602 02/13/16 1424 02/15/16 1232 03/01/16 1238 03/14/16 1252   Exercise Comments Marland Mcalpine wants to be able to clean the house without getting overly tired First full day of exercise!  Patient was oriented to gym and equipment including functions, settings, policies, and procedures.  Patient's individual exercise prescription and treatment plan were reviewed.  All starting workloads were established based on the results of the 6 minute walk test done at initial orientation visit.  The plan for exercise progression was also introduced and progression will be customized based on patient's performance and goals Shahed has done well in her first week of exercise. Shantera is progressing well with exercise. Plan is to have [facility] use 2L O2 during exercise as she has dropped below 90 during a session.      Discharge Exercise Prescription (Final Exercise Prescription Changes):     Exercise Prescription Changes - 03/15/16 1000      Exercise Review   Progression Yes     Response to Exercise   Blood Pressure (Admit) 138/62   Blood Pressure (Exercise) 124/56   Blood Pressure (Exit) 114/63   Heart Rate (Admit) 82 bpm   Heart Rate  (Exercise) 125 bpm   Heart Rate (Exit) 89 bpm   Oxygen Saturation (Admit) 99 %   Oxygen Saturation (Exercise) 90 %   Oxygen Saturation (Exit) 96 %   Rating of Perceived Exertion (Exercise) 14   Perceived Dyspnea (Exercise) 4   Symptoms none   Duration Progress to 45 minutes of aerobic exercise without signs/symptoms of physical distress   Intensity THRR unchanged     Progression   Progression Continue to progress workloads to maintain intensity without signs/symptoms of physical distress.     Resistance Training   Training Prescription Yes   Weight 2   Reps 10-12     Interval Training   Interval Training No     Treadmill   MPH 2.8   Grade 0.5   Minutes 15   METs 3.34     Elliptical   Level 1   Speed 2.5   Minutes 15     T5 Nustep   Level 2   Minutes 15       Nutrition:  Target Goals: Understanding of nutrition guidelines, daily intake of sodium <1551m, cholesterol <2032m calories 30% from fat and 7% or less from saturated fats, daily to have 5 or more servings of fruits and vegetables.  Biometrics:     Pre Biometrics - 02/06/16 1602      Pre Biometrics   Height 5' 4.7" (1.643 m)   Weight 141 lb 11.2 oz (64.3 kg)   Waist Circumference 33.25 inches   Hip Circumference 37.5 inches   Waist to Hip Ratio 0.89 %   BMI (Calculated) 23.8       Nutrition Therapy Plan and Nutrition Goals:   Nutrition Discharge: Rate Your Plate Scores:   Psychosocial: Target Goals: Acknowledge presence or absence of depression, maximize coping skills, provide positive support system. Participant is able to verbalize types and ability to use techniques and skills needed for reducing stress and depression.  Initial Review & Psychosocial Screening:     Initial Psych Review & Screening - 02/06/16 [place]? Yes   Comments Ms Ellingwood is very positive about dealing with her Pulmonary Fibrosis and has support from her children and husband.  She is looking forward to [place].     Barriers   Psychosocial barriers to participate in program The patient should benefit from training in stress management and relaxation.     Screening Interventions   Interventions Encouraged to exercise      Quality of Life Scores:     Quality of Life - 02/06/16 1602      Quality of Life Scores   Health/Function Pre 24.63 %   Socioeconomic Pre 21 %   Psych/Spiritual Pre 21 %   Family Pre 21 %   GLOBAL Pre 22.66 %      PHQ-9: Recent Review Flowsheet Data    Depression screen [facility] 2/9 02/06/2016 12/13/2015 07/04/2015 06/08/2015   Decreased Interest 0 0 0 0   Down, Depressed, Hopeless 0 0 0 0   PHQ - 2 Score 0 0 0 0   Altered sleeping 1 - - -   Tired, decreased energy 3 - - -   Change in appetite 0 - - -   Feeling bad or failure about yourself  0 - - -   Trouble concentrating 0 - - -   Moving slowly or fidgety/restless 0 - - -   Suicidal thoughts 0 - - -   PHQ-9 Score 4 - - -   Difficult doing work/chores Somewhat difficult - - -      Psychosocial Evaluation and Intervention:     Psychosocial Evaluation - 02/13/16 1055      Psychosocial Evaluation & Interventions   Interventions Encouraged to exercise with the program and follow exercise prescription   Comments Counselor met with Ms. Keeran today for initial psychosocial evaluation.  She is a 70 year old who has Pulmonary Fibrosis.  She has a strong support system with a spouse of 23 years; an adult daughter and friends who live close by and she is also actively involved in her local church.  Ms. Paddock states her health is otherwise pretty good and she sleeps well and has a good appetite.  She reports a history of depression almost a year ago when her mother had some increased health issues.  Ms. Gerre Pebbles Dr. put her on Sertraline (50 mg) during that time and through her mother's death this past October 20, 2022, she has remained on this medication; which she reports "is working well" for her.  She  states her mood is typically positive and she has minimal stress in her life - other than her health limiting her ability to do simple household chores/responsibilities.  Her goals for this program are to breathe better; to avoid oxygen; and to increase her stamina.  She will return to her [employer]ne[employer] class upon completion of this program.  Counselor will continue to follow with her throughout the course of Pulmonary Rehab.      Psychosocial Re-Evaluation:     Psychosocial Re-Evaluation    [place] Name 03/05/16 1024             Psychosocial Re-Evaluation   Comments Follow up with Ms. Decamp today reporting making some progress since came into this class.  She is noticing getting in and out of her car and up and down with greater ease.  She reports her mood is consistently positive and she is coping better with pacing herself in getting things done around the house; although she is able to do more now and that is positive as well.  Ms. Wanda Plump states she has enjoyed the educational parts of this program as well.  Counselor commended Ms. F on her hard work and progress made since coming into this program.           Education: Education Goals: Education classes will be provided on a weekly basis, covering required topics. Participant will state understanding/return demonstration of topics presented.  Learning Barriers/Preferences:   Education Topics: Initial Evaluation Education: - Verbal, written and demonstration of respiratory meds, RPE/PD scales, oximetry and breathing techniques. Instruction on use of nebulizers and MDIs: cleaning and proper use, rinsing mouth with steroid doses and importance of monitoring MDI activations. Flowsheet Row Pulmonary Rehab from 03/05/2016 in [facility] Cardiac and Pulmonary Rehab  Date  02/06/16  Educator  LB  Instruction Review Code  2- meets goals/outcomes      General Nutrition Guidelines/Fats and Fiber: -Group instruction provided by verbal, written  material, models and posters to present the general guidelines for heart healthy nutrition. Gives an explanation and review of dietary fats and fiber.   Controlling Sodium/Reading Food Labels: -Group verbal and written material supporting the discussion of sodium use in heart healthy nutrition. Review and explanation with models, verbal and written materials for utilization of the food label.   Exercise Physiology & Risk Factors: - Group verbal and written instruction with models to review the exercise physiology of the cardiovascular system and associated critical values. Details cardiovascular disease risk factors and the goals associated with each risk factor.   Aerobic Exercise & Resistance Training: - Gives group verbal and written discussion on the health impact of inactivity. On the components of aerobic and resistive training programs and the benefits of this training and how to safely progress through these programs.   Flexibility, Balance, General Exercise Guidelines: - Provides group verbal and written instruction on the benefits of flexibility and balance training programs. Provides general exercise guidelines with specific guidelines to those with heart or lung disease. Demonstration and skill practice provided.   Stress Management: - Provides group verbal and written instruction about the health risks of elevated stress, cause of high stress, and healthy ways to reduce stress.   Depression: - Provides group verbal and written instruction on the correlation between heart/lung disease and depressed mood, treatment options, and the stigmas associated with seeking treatment.   Exercise & Equipment Safety: - Individual verbal instruction and demonstration of equipment use and safety with use of the equipment. Flowsheet Row Pulmonary Rehab from 03/05/2016 in [facility] Cardiac and Pulmonary Rehab  Date  02/13/16  Educator  AS  Instruction Review Code  2- meets goals/outcomes  Infection Prevention: - Provides verbal and written material to individual with discussion of infection control including proper hand washing and proper equipment cleaning during exercise session. Flowsheet Row Pulmonary Rehab from 03/05/2016 in [facility] Cardiac and Pulmonary Rehab  Date  02/13/16  Educator  AS  Instruction Review Code  2- meets goals/outcomes      Falls Prevention: - Provides verbal and written material to individual with discussion of falls prevention and safety. Flowsheet Row Pulmonary Rehab from 03/05/2016 in [facility] Cardiac and Pulmonary Rehab  Date  02/06/16  Educator  LB  Instruction Review Code  2- meets goals/outcomes      Diabetes: - Individual verbal and written instruction to review signs/symptoms of diabetes, desired ranges of glucose level fasting, after meals and with exercise. Advice that pre and post exercise glucose checks will be done for 3 sessions at entry of program.   Chronic Lung Diseases: - Group verbal and written instruction to review new updates, new respiratory medications, new advancements in procedures and treatments. Provide informative websites and "800" numbers of self-education. Flowsheet Row Pulmonary Rehab from 03/05/2016 in [facility] Cardiac and Pulmonary Rehab  Date  03/05/16  Educator  LB  Instruction Review Code  2- meets goals/outcomes      Lung Procedures: - Group verbal and written instruction to describe testing methods done to diagnose lung disease. Review the outcome of test results. Describe the treatment choices: Pulmonary Function Tests, ABGs and oximetry.   Energy Conservation: - Provide group verbal and written instruction for methods to conserve energy, plan and organize activities. Instruct on pacing techniques, use of adaptive equipment and posture/positioning to relieve shortness of breath. Flowsheet Row Pulmonary Rehab from 03/05/2016 in [facility] Cardiac and Pulmonary Rehab  Date  02/29/16  Educator  Fredderick Erb, EP   Instruction Review Code  2- meets goals/outcomes      Triggers: - Group verbal and written instruction to review types of environmental controls: home humidity, furnaces, filters, dust mite/pet prevention, HEPA vacuums. To discuss weather changes, air quality and the benefits of nasal washing.   Exacerbations: - Group verbal and written instruction to provide: warning signs, infection symptoms, calling MD promptly, preventive modes, and value of vaccinations. Review: effective airway clearance, coughing and/or vibration techniques. Create an Sports administrator.   Oxygen: - Individual and group verbal and written instruction on oxygen therapy. Includes supplement oxygen, available portable oxygen systems, continuous and intermittent flow rates, oxygen safety, concentrators, and Medicare reimbursement for oxygen.   Respiratory Medications: - Group verbal and written instruction to review medications for lung disease. Drug class, frequency, complications, importance of spacers, rinsing mouth after steroid MDI's, and proper cleaning methods for nebulizers.   AED/CPR: - Group verbal and written instruction with the use of models to demonstrate the basic use of the AED with the basic ABC's of resuscitation.   Breathing Retraining: - Provides individuals verbal and written instruction on purpose, frequency, and proper technique of diaphragmatic breathing and pursed-lipped breathing. Applies individual practice skills. Flowsheet Row Pulmonary Rehab from 03/05/2016 in [facility] Cardiac and Pulmonary Rehab  Date  02/13/16  Educator  AS  Instruction Review Code  2- meets goals/outcomes      Anatomy and Physiology of the Lungs: - Group verbal and written instruction with the use of models to provide basic lung anatomy and physiology related to function, structure and complications of lung disease.   Heart Failure: - Group verbal and written instruction on the basics of heart failure: signs/symptoms,  treatments, explanation of ejection  fraction, enlarged heart and cardiomyopathy.   Sleep Apnea: - Individual verbal and written instruction to review Obstructive Sleep Apnea. Review of risk factors, methods for diagnosing and types of masks and machines for OSA.   Anxiety: - Provides group, verbal and written instruction on the correlation between heart/lung disease and anxiety, treatment options, and management of anxiety. Flowsheet Row Pulmonary Rehab from 03/05/2016 in [facility] Cardiac and Pulmonary Rehab  Date  02/22/16  Educator  [facility]  Instruction Review Code  2- Meets goals/outcomes      Relaxation: - Provides group, verbal and written instruction about the benefits of relaxation for patients with heart/lung disease. Also provides patients with examples of relaxation techniques. Flowsheet Row Pulmonary Rehab from 03/05/2016 in [facility] Cardiac and Pulmonary Rehab  Date  02/22/16  Educator  [facility]  Instruction Review Code  2- Meets goals/outcomes      Knowledge Questionnaire Score:     Knowledge Questionnaire Score - 02/06/16 1552      Knowledge Questionnaire Score   Pre Score 9/10       Core Components/Risk Factors/Patient Goals at Admission:     Personal Goals and Risk Factors at Admission - 02/06/16 1558      Core Components/Risk Factors/Patient Goals on Admission    Weight Management Yes   Intervention Weight Management: Develop a combined nutrition and exercise program designed to reach desired caloric intake, while maintaining appropriate intake of nutrient and fiber, sodium and fats, and appropriate energy expenditure required for the weight goal.;Weight Management: Provide education and appropriate resources to help participant work on and attain dietary goals.   Admit Weight 141 lb 11.2 oz (64.3 kg)   Goal Weight: Short Term 138 lb (62.6 kg)   Goal Weight: Long Term 125 lb (56.7 kg)   Expected Outcomes Short Term: Continue to assess and modify interventions until short  term weight is achieved;Long Term: Adherence to nutrition and physical activity/exercise program aimed toward attainment of established weight goal;Weight Maintenance: Understanding of the daily nutrition guidelines, which includes 25-35% calories from fat, 7% or less cal from saturated fats, less than 233m cholesterol, less than 1.5gm of sodium, & 5 or more servings of fruits and vegetables daily   Sedentary Yes   Intervention Provide advice, education, support and counseling about physical activity/exercise needs.;Develop an individualized exercise prescription for aerobic and resistive training based on initial evaluation findings, risk stratification, comorbidities and participant's personal goals.  Home Treadmill and Silver Sneaker Classes   Expected Outcomes Achievement of increased cardiorespiratory fitness and enhanced flexibility, muscular endurance and strength shown through measurements of functional capacity and personal statement of participant.   Increase Strength and Stamina Yes   Intervention Provide advice, education, support and counseling about physical activity/exercise needs.;Develop an individualized exercise prescription for aerobic and resistive training based on initial evaluation findings, risk stratification, comorbidities and participant's personal goals.   Expected Outcomes Achievement of increased cardiorespiratory fitness and enhanced flexibility, muscular endurance and strength shown through measurements of functional capacity and personal statement of participant.   Improve shortness of breath with ADL's Yes   Intervention Provide education, individualized exercise plan and daily activity instruction to help decrease symptoms of SOB with activities of daily living.  Complete housework with less shortness of breath   Expected Outcomes Short Term: Achieves a reduction of symptoms when performing activities of daily living.   Develop more efficient breathing techniques such  as purse lipped breathing and diaphragmatic breathing; and practicing self-pacing with activity Yes   Intervention Provide education,  demonstration and support about specific breathing techniuqes utilized for more efficient breathing. Include techniques such as pursed lipped breathing, diaphragmatic breathing and self-pacing activity.   Expected Outcomes Short Term: Participant will be able to demonstrate and use breathing techniques as needed throughout daily activities.      Core Components/Risk Factors/Patient Goals Review:      Goals and Risk Factor Review    Row Name 02/13/16 1419 02/20/16 1456 02/27/16 0922 02/27/16 0932       Core Components/Risk Factors/Patient Goals Review   Personal Goals Review Develop more efficient breathing techniques such as purse lipped breathing and diaphragmatic breathing and practicing self-pacing with activity. Develop more efficient breathing techniques such as purse lipped breathing and diaphragmatic breathing and practicing self-pacing with activity.;Other Sedentary;Improve shortness of breath with ADL's  -    Review Pursed lip breathing techniques were discussed with the patient. He patient demonstrated understanding of these techniques.  Ms Saidi was cued to perform PLB after her EL exercise. She had increased her time to 60mns from set work and benefited from the PLB to recover from her shortness of breath. Also I gave her a copy of the Pulmonary Fibrosis Handbook from the [employer].  Unfortunately she does not drive at night and can not attend the PF Support Group. Ms FDemiliostates she has noticed an improvement in certain activities, such as getting up from a chair, vaccuming, and getting out of her car. She has performed PLB for mahy years and knows the benefit of the technique. [facility] [employer]. Ms FCermakstates she has noticed an improvement in certain activities, such as getting up  from a chair, vaccuming, and getting out of her car. She has performed PLB for many years and knows the benefit of the technique. [facility] [employer].    Expected Outcomes Patient will use pursed lip breathing during exercise and daily activities to control shortness of breath.   -  -  -       Core Components/Risk Factors/Patient Goals at Discharge (Final Review):      Goals and Risk Factor Review - 02/27/16 0932      Core Components/Risk Factors/Patient Goals Review   Review Ms FSchlotterstates she has noticed an improvement in certain activities, such as getting up from a chair, vaccuming, and getting out of her car. She has performed PLB for many years and knows the benefit of the technique. [facility] [employer].      ITP Comments:     ITP Comments    Row Name 02/15/16 1233           ITP Comments BAlannahas done well in her first week of exercise.          Comments: 30 Day Review

## 2016-03-21 ENCOUNTER — Encounter: Payer: Medicare Other | Attending: Specialist

## 2016-03-21 DIAGNOSIS — J8410 Pulmonary fibrosis, unspecified: Secondary | ICD-10-CM | POA: Diagnosis not present

## 2016-03-21 NOTE — Progress Notes (Signed)
Daily Session Note  Patient Details  Name: Melanie Rollins MRN: [id number] Date of Birth: Mar 10, 1946 Referring Provider:   Flowsheet Row Pulmonary Rehab from 02/06/2016 in [facility] Cardiac and Pulmonary Rehab  Referring Provider  Wallene Huh MD      Encounter Date: 03/21/2016  Check In:     Session Check In - 03/21/16 1105      Check-In   Location [facility]-Cardiac & Pulmonary Rehab   Staff Present Carson Myrtle, BS, RRT, Respiratory Lennie Hummer, MA, ACSM RCEP, Exercise Physiologist;Amanda Oletta Darter, BA, ACSM CEP, Exercise Physiologist   Supervising physician immediately available to respond to emergencies [facility] immediately available ER MD   Physician(s) Alfred Levins and Reita Cliche   Medication changes reported     No   Fall or balance concerns reported    No   Warm-up and Cool-down Performed as group-led Location manager Performed Yes   VAD Patient? No     Pain Assessment   Currently in Pain? No/denies         Goals Met:  Proper associated with RPD/PD & O2 Sat Independence with exercise equipment Exercise tolerated well Strength training completed today  Goals Unmet:  Not Applicable  Comments:      6 Minute Walk    Row Name 02/06/16 1556         6 Minute Walk   Phase Initial     Distance 1275 feet     Walk Time 4.97 minutes     # of Rest Breaks 6  For oxygen desaturation: 7 sec, 6 sec, 10 sec, 11 sec, 12 sec, 16 sec     MPH 2.92     METS 2.98     RPE 13     Perceived Dyspnea  4     VO2 Peak 10.44     Symptoms No     Resting HR 62 bpm     Resting BP 116/70     Max Ex. HR 95 bpm     Max Ex. BP 126/64     2 Minute Post BP 126/60       Interval HR   Baseline HR 62     1 Minute HR 70     2 Minute HR 90     3 Minute HR 93     4 Minute HR 95     5 Minute HR 90     6 Minute HR 88     2 Minute Post HR 62     Interval Heart Rate? Yes       Interval Oxygen   Interval Oxygen? Yes     Baseline Oxygen Saturation % 99 %     Baseline  Liters of Oxygen 0 L  Room Air     1 Minute Oxygen Saturation % 78 %  quick recovery to 94%      1 Minute Liters of Oxygen 0 L     2 Minute Oxygen Saturation % 80 %     2 Minute Liters of Oxygen 0 L     3 Minute Oxygen Saturation % 91 %  80% at 2:44     3 Minute Liters of Oxygen 0 L     4 Minute Oxygen Saturation % 95 %  77% at 3:34     4 Minute Liters of Oxygen 0 L     5 Minute Oxygen Saturation % 92 %  79% at 4:27     5 Minute Liters of Oxygen 0 L  6 Minute Oxygen Saturation % 85 %  81% at 5:14     6 Minute Liters of Oxygen 0 L     2 Minute Post Oxygen Saturation % 100 %     2 Minute Post Liters of Oxygen 0 L         Dr. Emily Filbert is Medical Director for [place] and [facility].

## 2016-03-23 DIAGNOSIS — J8410 Pulmonary fibrosis, unspecified: Secondary | ICD-10-CM | POA: Diagnosis not present

## 2016-03-23 NOTE — Progress Notes (Signed)
Daily Session Note  Patient Details  Name: Melanie Rollins MRN: [id number] Date of Birth: Aug 23, 1945 Referring Provider:   Flowsheet Row Pulmonary Rehab from 02/06/2016 in [facility] Cardiac and Pulmonary Rehab  Referring Provider  Wallene Huh MD      Encounter Date: 03/23/2016  Check In:     Session Check In - 03/23/16 1153      Check-In   Location [facility]-Cardiac & Pulmonary Rehab   Staff Present Heath Lark, RN, BSN, CCRP;Laureen Owens Shark, BS, RRT, Respiratory Dareen Piano, BA, ACSM CEP, Exercise Physiologist   Supervising physician immediately available to respond to emergencies [facility] immediately available ER MD   Physician(s) Burlene Arnt and Joni Fears   Medication changes reported     No   Fall or balance concerns reported    No   Warm-up and Cool-down Performed as group-led Location manager Performed Yes   VAD Patient? No     Pain Assessment   Currently in Pain? No/denies         Goals Met:  Proper associated with RPD/PD & O2 Sat Independence with exercise equipment Exercise tolerated well Strength training completed today  Goals Unmet:  Not Applicable  Comments: Pt able to follow exercise prescription today without complaint.  Will continue to monitor for progression.    Dr. Emily Filbert is Medical Director for [place] and [facility].

## 2016-03-26 ENCOUNTER — Encounter: Payer: Medicare Other | Admitting: *Deleted

## 2016-03-26 DIAGNOSIS — J8410 Pulmonary fibrosis, unspecified: Secondary | ICD-10-CM | POA: Diagnosis not present

## 2016-03-26 NOTE — Progress Notes (Signed)
Daily Session Note  Patient Details  Name: Melanie Rollins MRN: [id number] Date of Birth: [birth year] Referring Provider:   Flowsheet Row Pulmonary Rehab from 02/06/2016 in [facility] Cardiac and Pulmonary Rehab  Referring Provider  Wallene Huh MD      Encounter Date: 03/26/2016  Check In:     Session Check In - 03/26/16 1014      Check-In   Location [facility]-Cardiac & Pulmonary Rehab   Staff Present Carson Myrtle, BS, RRT, Respiratory Therapist;Abdulkadir Emmanuel Amedeo Plenty, BS, ACSM CEP, Exercise Physiologist;Amanda Oletta Darter, BA, ACSM CEP, Exercise Physiologist   Supervising physician immediately available to respond to emergencies [facility] immediately available ER MD   Physician(s) Dr. Jimmye Norman and Dr. Corky Downs   Medication changes reported     No   Fall or balance concerns reported    No   Warm-up and Cool-down Performed on first and last piece of equipment   Resistance Training Performed Yes   VAD Patient? No     Pain Assessment   Currently in Pain? No/denies   Multiple Pain Sites No         Goals Met:  Proper associated with RPD/PD & O2 Sat Independence with exercise equipment Exercise tolerated well Strength training completed today  Goals Unmet:  Not Applicable  Comments: Pt able to follow exercise prescription today without complaint.  Will continue to monitor for progression.    Dr. Emily Filbert is Medical Director for [place] and [facility].

## 2016-03-30 ENCOUNTER — Encounter: Payer: Medicare Other | Admitting: *Deleted

## 2016-03-30 DIAGNOSIS — J8410 Pulmonary fibrosis, unspecified: Secondary | ICD-10-CM

## 2016-03-30 NOTE — Progress Notes (Signed)
Daily Session Note  Patient Details  Name: Melanie Rollins MRN: [id number] Date of Birth: [birth year] Referring Provider:   Flowsheet Row Pulmonary Rehab from 02/06/2016 in [facility] Cardiac and Pulmonary Rehab  Referring Provider  Wallene Huh MD      Encounter Date: 03/30/2016  Check In:     Session Check In - 03/30/16 1056      Check-In   Location [facility]-Cardiac & Pulmonary Rehab   Staff Present Gerlene Burdock, RN, BSN;Susanne Bice, RN, BSN, Lance Sell, BA, ACSM CEP, Exercise Physiologist   Supervising physician immediately available to respond to emergencies [facility] immediately available ER MD   Physician(s) Dr. Archie Balboa and Dr. Clearnce Hasten   Medication changes reported     No   Fall or balance concerns reported    No   Warm-up and Cool-down Performed as group-led instruction   Resistance Training Performed Yes   VAD Patient? No     Pain Assessment   Currently in Pain? No/denies           Exercise Prescription Changes - 03/30/16 0800      Exercise Review   Progression Yes      Goals Met:  Proper associated with RPD/PD & O2 Sat Exercise tolerated well  Goals Unmet:  Not Applicable  Comments:     Dr. Emily Filbert is Medical Director for [place] and [facility].

## 2016-04-02 ENCOUNTER — Encounter: Payer: Medicare Other | Admitting: *Deleted

## 2016-04-02 DIAGNOSIS — J8410 Pulmonary fibrosis, unspecified: Secondary | ICD-10-CM | POA: Diagnosis not present

## 2016-04-02 NOTE — Progress Notes (Signed)
Daily Session Note  Patient Details  Name: Melanie Rollins MRN: [id number] Date of Birth: Sep 20, 1945 Referring Provider:   Flowsheet Row Pulmonary Rehab from 02/06/2016 in [facility] Cardiac and Pulmonary Rehab  Referring Provider  Wallene Huh MD      Encounter Date: 04/02/2016  Check In:     Session Check In - 04/02/16 1015      Check-In   Location [facility]-Cardiac & Pulmonary Rehab   Staff Present Gerlene Burdock, RN, Moises Blood, BS, ACSM CEP, Exercise Physiologist;Amanda Oletta Darter, BA, ACSM CEP, Exercise Physiologist   Supervising physician immediately available to respond to emergencies [facility] immediately available ER MD   Physician(s) Cinda Quest and Joni Fears   Medication changes reported     No   Fall or balance concerns reported    No   Warm-up and Cool-down Performed on first and last piece of equipment   Resistance Training Performed Yes   VAD Patient? No     Pain Assessment   Currently in Pain? No/denies   Multiple Pain Sites No         Goals Met:  Proper associated with RPD/PD & O2 Sat Independence with exercise equipment Exercise tolerated well No report of cardiac concerns or symptoms Strength training completed today  Goals Unmet:  Not Applicable  Comments: Pt able to follow exercise prescription today without complaint.  Will continue to monitor for progression.    Dr. Emily Filbert is Medical Director for [place] and [facility].

## 2016-04-04 ENCOUNTER — Encounter: Payer: Medicare Other | Admitting: *Deleted

## 2016-04-04 DIAGNOSIS — J8410 Pulmonary fibrosis, unspecified: Secondary | ICD-10-CM

## 2016-04-04 NOTE — Progress Notes (Signed)
Daily Session Note  Patient Details  Name: Melanie Rollins MRN: [id number] Date of Birth: Jul 04, 1945 Referring Provider:   Flowsheet Row Pulmonary Rehab from 02/06/2016 in [facility] Cardiac and Pulmonary Rehab  Referring Provider  Wallene Huh MD      Encounter Date: 04/04/2016  Check In:     Session Check In - 04/04/16 1012      Check-In   Location [facility]-Cardiac & Pulmonary Rehab   Staff Present Alberteen Sam, MA, ACSM RCEP, Exercise Physiologist;Laureen Owens Shark, BS, RRT, Respiratory Dareen Piano, BA, ACSM CEP, Exercise Physiologist   Supervising physician immediately available to respond to emergencies [facility] immediately available ER MD   Physician(s) Drs. Alfred Levins and Blanche   Medication changes reported     No   Fall or balance concerns reported    No   Warm-up and Cool-down Performed as group-led Location manager Performed Yes   VAD Patient? No     Pain Assessment   Currently in Pain? No/denies   Multiple Pain Sites No         Goals Met:  Proper associated with RPD/PD & O2 Sat Independence with exercise equipment Using PLB without cueing & demonstrates good technique Exercise tolerated well Strength training completed today  Goals Unmet:  Not Applicable  Comments: Pt able to follow exercise prescription today without complaint.  Will continue to monitor for progression.    Dr. Emily Filbert is Medical Director for [place] and [facility].

## 2016-04-09 ENCOUNTER — Encounter: Payer: Medicare Other | Admitting: *Deleted

## 2016-04-09 DIAGNOSIS — J8410 Pulmonary fibrosis, unspecified: Secondary | ICD-10-CM

## 2016-04-09 NOTE — Progress Notes (Signed)
Daily Session Note  Patient Details  Name: Melanie Rollins MRN: [id number] Date of Birth: [birth year] Referring Provider:   Flowsheet Row Pulmonary Rehab from 02/06/2016 in [facility] Cardiac and Pulmonary Rehab  Referring Provider  Wallene Huh MD      Encounter Date: 04/09/2016  Check In:     Session Check In - 04/09/16 1133      Check-In   Location [facility]-Cardiac & Pulmonary Rehab   Staff Present Carson Myrtle, BS, RRT, Respiratory Therapist;Aracely Rickett Amedeo Plenty, BS, ACSM CEP, Exercise Physiologist;Amanda Oletta Darter, BA, ACSM CEP, Exercise Physiologist   Supervising physician immediately available to respond to emergencies [facility] immediately available ER MD   Physician(s) Drs. Malinda and Kinner   Medication changes reported     No   Fall or balance concerns reported    No   Warm-up and Cool-down Performed on first and last piece of equipment   Resistance Training Performed Yes   VAD Patient? No     Pain Assessment   Currently in Pain? No/denies   Multiple Pain Sites No         Goals Met:  Proper associated with RPD/PD & O2 Sat Independence with exercise equipment Exercise tolerated well Strength training completed today  Goals Unmet:  Not Applicable  Comments: Pt able to follow exercise prescription today without complaint.  Will continue to monitor for progression.    Dr. Emily Filbert is Medical Director for [place] and [facility].

## 2016-04-11 DIAGNOSIS — J8410 Pulmonary fibrosis, unspecified: Secondary | ICD-10-CM | POA: Diagnosis not present

## 2016-04-11 NOTE — Progress Notes (Signed)
Daily Session Note  Patient Details  Name: Melanie Rollins MRN: [id number] Date of Birth: [birth year] Referring Provider:   Flowsheet Row Pulmonary Rehab from 02/06/2016 in [facility] Cardiac and Pulmonary Rehab  Referring Provider  Wallene Huh MD      Encounter Date: 04/11/2016  Check In:     Session Check In - 04/11/16 1053      Check-In   Location [facility]-Cardiac & Pulmonary Rehab   Staff Present Carson Myrtle, BS, RRT, Respiratory Lennie Hummer, MA, ACSM RCEP, Exercise Physiologist;Somya Jauregui Oletta Darter, BA, ACSM CEP, Exercise Physiologist;Other   Supervising physician immediately available to respond to emergencies [facility] immediately available ER MD   Physician(s) Marcelene Butte and Alfred Levins   Medication changes reported     No   Fall or balance concerns reported    No   Warm-up and Cool-down Performed as group-led Location manager Performed Yes   VAD Patient? No     Pain Assessment   Currently in Pain? No/denies           Exercise Prescription Changes - 04/10/16 1400      Exercise Review   Progression Yes     Response to Exercise   Blood Pressure (Admit) 110/62   Blood Pressure (Exercise) 138/70   Blood Pressure (Exit) 104/72   Heart Rate (Admit) 83 bpm   Heart Rate (Exercise) 134 bpm   Heart Rate (Exit) 99 bpm   Oxygen Saturation (Admit) 95 %   Oxygen Saturation (Exercise) 84 %   Oxygen Saturation (Exit) 99 %   Rating of Perceived Exertion (Exercise) 15   Perceived Dyspnea (Exercise) 5   Duration Progress to 45 minutes of aerobic exercise without signs/symptoms of physical distress   Intensity THRR unchanged     Progression   Progression Continue to progress workloads to maintain intensity without signs/symptoms of physical distress.     Resistance Training   Training Prescription Yes   Weight 2   Reps 10-12     Interval Training   Interval Training No     Treadmill   MPH 2.8   Grade 0   Minutes 15   METs 3.14     Elliptical   Level 1   Speed 2.6   Minutes 13      Goals Met:  Proper associated with RPD/PD & O2 Sat Independence with exercise equipment Exercise tolerated well Strength training completed today  Goals Unmet:  Not Applicable  Comments: Pt able to follow exercise prescription today without complaint.  Will continue to monitor for progression.    Dr. Emily Filbert is Medical Director for [place] and [facility].

## 2016-04-16 ENCOUNTER — Encounter: Payer: Medicare Other | Admitting: *Deleted

## 2016-04-16 ENCOUNTER — Encounter: Payer: Self-pay | Admitting: Respiratory Therapy

## 2016-04-16 DIAGNOSIS — J8410 Pulmonary fibrosis, unspecified: Secondary | ICD-10-CM | POA: Diagnosis not present

## 2016-04-16 NOTE — Progress Notes (Signed)
Daily Session Note  Patient Details  Name: Melanie Rollins MRN: [id number] Date of Birth: [birth year] Referring Provider:   Flowsheet Row Pulmonary Rehab from 02/06/2016 in [facility] Cardiac and Pulmonary Rehab  Referring Provider  Wallene Huh MD      Encounter Date: 04/16/2016  Check In:     Session Check In - 04/16/16 1010      Check-In   Location [facility]-Cardiac & Pulmonary Rehab   Staff Present Carson Myrtle, BS, RRT, Respiratory Therapist;Deyonte Cadden Amedeo Plenty, BS, ACSM CEP, Exercise Physiologist;Amanda Oletta Darter, BA, ACSM CEP, Exercise Physiologist   Supervising physician immediately available to respond to emergencies [facility] immediately available ER MD   Physician(s) Drs. Joni Fears and [place]   Medication changes reported     No   Fall or balance concerns reported    No   Warm-up and Cool-down Performed on first and last piece of equipment   Resistance Training Performed Yes   VAD Patient? No     Pain Assessment   Currently in Pain? No/denies   Multiple Pain Sites No         Goals Met:  Proper associated with RPD/PD & O2 Sat Independence with exercise equipment Exercise tolerated well Strength training completed today  Goals Unmet:  Not Applicable  Comments: Pt able to follow exercise prescription today without complaint.  Will continue to monitor for progression.    Dr. Emily Filbert is Medical Director for [place] and [facility].

## 2016-04-16 NOTE — Progress Notes (Signed)
Pulmonary Individual Treatment Plan  Patient Details  Name: Melanie Rollins MRN: [id number] Date of Birth: Jun 25, 1945 Referring Provider:   Flowsheet Row Pulmonary Rehab from 02/06/2016 in [facility] Cardiac and Pulmonary Rehab  Referring Provider  Wallene Huh MD      Initial Encounter Date:  Flowsheet Row Pulmonary Rehab from 02/06/2016 in [facility] Cardiac and Pulmonary Rehab  Date  02/06/16  Referring Provider  Wallene Huh MD      Visit Diagnosis: Pulmonary fibrosis ([employer])  Patient's Home Medications on Admission:  Current Outpatient Prescriptions:    Acetylcysteine (NAC) 600 MG CAPS, Take by mouth. Take 2 tablets daily, Disp: , Rfl:    albuterol (PROVENTIL HFA;VENTOLIN HFA) 108 (90 BASE) MCG/ACT inhaler, Inhale 2 puffs into the lungs every 6 (six) hours as needed for wheezing or shortness of breath., Disp: 1 Inhaler, Rfl: 0   alendronate (FOSAMAX) 70 MG tablet, Take 70 mg by mouth once a week. Take with a full glass of water on an empty stomach., Disp: , Rfl:    Calcium Citrate-Vitamin D (CALCIUM + D PO), Take 600 mg by mouth daily., Disp: , Rfl:    CINNAMON PO, Take 1,000 mg by mouth daily., Disp: , Rfl:    conjugated estrogens (PREMARIN) vaginal cream, Place 1 Applicatorful vaginally 2 (two) times a week. Reported on 05/11/2015, Disp: 320 g, Rfl: 2   hydroxychloroquine (PLAQUENIL) 200 MG tablet, Take by mouth. Take 2 tablets once a day, Disp: , Rfl:    L-Lysine 500 MG TABS, Take 500 mg by mouth daily., Disp: , Rfl:    lansoprazole (PREVACID) 15 MG capsule, Take 15 mg by mouth daily at 12 noon., Disp: , Rfl:    Magnesium 250 MG TABS, Take by mouth daily., Disp: , Rfl:    Multiple Vitamin (MULTIVITAMIN) tablet, Take 1 tablet by mouth daily., Disp: , Rfl:    Omega-3 Fatty Acids (FISH OIL) 1000 MG CAPS, Take 1,000 mg by mouth daily., Disp: , Rfl:    predniSONE (DELTASONE) 5 MG tablet, Take 5 mg by mouth daily with breakfast., Disp: , Rfl:    sertraline (ZOLOFT) 50 MG  tablet, Take 1 tablet (50 mg total) by mouth daily. 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[place])     Tobacco Use: History  Smoking Status   Never Smoker  Smokeless Tobacco   Never Used    Labs: Recent Review Flowsheet Data    Labs for ITP Cardiac and Pulmonary Rehab Latest Ref Rng & Units 07/04/2015   Cholestrol 100 - 199 mg/dL 198   LDLCALC 0 - 99 mg/dL 91   HDL >39 mg/dL 79   Trlycerides 0 - 149 mg/dL 142       ADL UCSD:     Pulmonary Assessment Scores    Row Name 02/06/16 1555 03/26/16 0628       ADL UCSD   ADL Phase Entry Mid    SOB Score total 46 51    Rest 0 0    Walk 2 2    Stairs 3 4    Bath 1 1    Dress 1 2    Shop 2 2       Pulmonary Function Assessment:     Pulmonary Function Assessment - 02/06/16 1553  Pulmonary Function Tests   FVC% 80 %   FEV1% 98 %   FEV1/FVC Ratio 93.74   RV% 28 %   DLCO% 61 %     Initial Spirometry Results   Comments Spirometry 02/06/16; DLCO and TLC 01/03/16     Breath   Bilateral Breath Sounds Decreased;Rales;Basilar   Shortness of Breath Yes;Limiting activity      Exercise Target Goals:    Exercise Program Goal: Individual exercise prescription set with THRR, safety & activity barriers. Participant demonstrates ability to understand and report RPE using BORG scale, to self-measure pulse accurately, and to acknowledge the importance of the exercise prescription.  Exercise Prescription Goal: Starting with aerobic activity 30 plus minutes a day, 3 days per week for initial exercise prescription. Provide home exercise prescription and guidelines that participant acknowledges understanding prior to discharge.  Activity Barriers & Risk Stratification:     Activity Barriers & Cardiac Risk Stratification -  02/06/16 1552      Activity Barriers & Cardiac Risk Stratification   Activity Barriers Arthritis;Shortness of Breath;Deconditioning   Cardiac Risk Stratification Moderate      6 Minute Walk:     6 Minute Walk    Row Name 02/06/16 1556 03/21/16 1105       6 Minute Walk   Phase Initial Mid Program    Distance 1275 feet 1525 feet    Distance % Change  -- 16.4 %    Walk Time 4.97 minutes 6 minutes    # of Rest Breaks 6  For oxygen desaturation: 7 sec, 6 sec, 10 sec, 11 sec, 12 sec, 16 sec 0    MPH 2.92 2.9    METS 2.98 2.9    RPE 13 13    Perceived Dyspnea  4  --    VO2 Peak 10.44 13.47    Symptoms No No    Resting HR 62 bpm 81 bpm    Resting BP 116/70 104/64    Max Ex. HR 95 bpm 128 bpm    Max Ex. BP 126/64 142/62    2 Minute Post BP 126/60  --      Interval HR   Baseline HR 62 81    1 Minute HR 70 104    2 Minute HR 90 115    3 Minute HR 93 119    4 Minute HR 95 119    5 Minute HR 90 118    6 Minute HR 88 128    2 Minute Post HR 62 93    Interval Heart Rate? Yes Yes      Interval Oxygen   Interval Oxygen? Yes  --    Baseline Oxygen Saturation % 99 % 95 %    Baseline Liters of Oxygen 0 L  Room Air  --    1 Minute Oxygen Saturation % 78 %  quick recovery to 94%  95 %    1 Minute Liters of Oxygen 0 L  --    2 Minute Oxygen Saturation % 80 % 90 %    2 Minute Liters of Oxygen 0 L  --    3 Minute Oxygen Saturation % 91 %  80% at 2:44 87 %    3 Minute Liters of Oxygen 0 L  --    4 Minute Oxygen Saturation % 95 %  77% at 3:34 89 %    4 Minute Liters of Oxygen 0 L  --    5 Minute Oxygen Saturation % 92 %  79% at 4:27 89 %    5 Minute Liters of Oxygen 0 L  --    6 Minute Oxygen Saturation % 85 %  81% at 5:14 85 %    6 Minute Liters of Oxygen 0 L  --    2 Minute Post Oxygen Saturation % 100 % 91 %    2 Minute Post Liters of Oxygen 0 L  --       Initial Exercise Prescription:     Initial Exercise Prescription - 02/06/16 1500      Date of Initial Exercise  RX and Referring Provider   Date 02/06/16   Referring Provider Wallene Huh MD     Treadmill   MPH 2.6   Grade 0.5   Minutes 15  2 min intervals (oxygen saturation dependent)   METs 3.71     Elliptical   Level 1   Speed 2.6   Minutes 15     T5 Nustep   Level 2   Watts --  60-80 spm   Minutes 15   METs 2     Prescription Details   Frequency (times per week) 3   Duration Progress to 45 minutes of aerobic exercise without signs/symptoms of physical distress     Intensity   THRR 40-80% of Max Heartrate 98-133   Ratings of Perceived Exertion 11-13   Perceived Dyspnea 0-4     Progression   Progression Continue to progress workloads to maintain intensity without signs/symptoms of physical distress.     Resistance Training   Training Prescription Yes   Weight 2 lbs   Reps 10-12      Perform Capillary Blood Glucose checks as needed.  Exercise Prescription Changes:     Exercise Prescription Changes    Row Name 02/13/16 1400 02/22/16 1200 03/01/16 1200 03/15/16 1000 03/26/16 1500     Exercise Review   Progression  --  -- Yes Yes Yes     Response to Exercise   Blood Pressure (Admit) 100/60  -- 110/58 138/62 102/56   Blood Pressure (Exercise) 130/70  -- 128/64 124/56 120/58   Blood Pressure (Exit) 107/60  -- 124/60 114/63 106/64   Heart Rate (Admit) 63 bpm  -- 74 bpm 82 bpm 93 bpm   Heart Rate (Exercise) 126 bpm  -- 132 bpm 125 bpm 133 bpm   Heart Rate (Exit) 73 bpm  -- 76 bpm 89 bpm 83 bpm   Oxygen Saturation (Admit) 98 %  -- 96 % 99 % 94 %   Oxygen Saturation (Exercise) 91 %  -- 89 % 90 % 91 %   Oxygen Saturation (Exit) 98 %  -- 99 % 96 % 97 %   Rating of Perceived Exertion (Exercise) 13  -- _0 Perceived Dyspnea (Exercise) 4  -- _1 Symptoms none  -- one none none   Duration Progress to 30 minutes of continuous aerobic without signs/symptoms of physical distress  -- Progress to 45 minutes of aerobic exercise without signs/symptoms of physical  distress Progress to 45 minutes of aerobic exercise without signs/symptoms of physical distress  --   Intensity THRR unchanged  98-133  -- THRR unchanged THRR unchanged  --     Progression   Progression  --  --  -- Continue to progress workloads to maintain intensity without signs/symptoms of physical distress.  --     Horticulturist, commercial Prescription Yes  -- Yes Yes Yes  Weight 2 lbs  -- _0 Reps 10-12  -- 10-15 10-12 10-12     Interval Training   Interval Training  --  -- No No No     Treadmill   MPH 2.6  -- 2.8 2.8 2.8   Grade 0.5  -- 0.5 0.5 1.5   Minutes 15  2 min intervals (oxygen saturation dependent)  -- _1 METs 3.71  -- 3.34 3.34 3.72     Elliptical   Level 1  -- _2 Speed 2.6  -- 2.6 2.5 2.5   Minutes 15  -- _3 T5 Nustep   Level 2  -- _4 Watts --  60-80 spm  --  --  --  --   Minutes 15  -- _5 METs 2  -- 2  --  --     Home Exercise Plan   Plans to continue exercise at  -- [facility]  --  --  --   Frequency  -- Add 2 additional days to program exercise sessions.  [employer]ne[employer] classes  --  --  --   Row Name 03/30/16 0800 04/10/16 1400           Exercise Review   Progression Yes Yes        Response to Exercise   Blood Pressure (Admit)  -- 110/62      Blood Pressure (Exercise)  -- 138/70      Blood Pressure (Exit)  -- 104/72      Heart Rate (Admit)  -- 83 bpm      Heart Rate (Exercise)  -- 134 bpm      Heart Rate (Exit)  -- 99 bpm      Oxygen Saturation (Admit)  -- 95 %      Oxygen Saturation (Exercise)  -- 84 %      Oxygen Saturation (Exit)  -- 99 %      Rating of Perceived Exertion (Exercise)  -- 15      Perceived Dyspnea (Exercise)  -- 5      Duration  -- Progress to 45 minutes of aerobic exercise without signs/symptoms of physical distress      Intensity  -- THRR unchanged        Progression   Progression  -- Continue to progress workloads to maintain intensity without signs/symptoms  of physical distress.        Resistance Training   Training Prescription  -- Yes      Weight  -- 2      Reps  -- 10-12        Interval Training   Interval Training  -- No        Treadmill   MPH  -- 2.8      Grade  -- 0      Minutes  -- 15      METs  -- 3.14        Elliptical   Level  -- 1      Speed  -- 2.6      Minutes  -- 13         Exercise Comments:     Exercise Comments    Row Name 02/06/16 1602 02/13/16 1424 02/15/16 1232 03/01/16 1238 03/14/16 1252   Exercise Comments Melanie Rollins wants to be able to clean the house without getting overly tired  First full day of exercise!  Patient was oriented to gym and equipment including functions, settings, policies, and procedures.  Patient's individual exercise prescription and treatment plan were reviewed.  All starting workloads were established based on the results of the 6 minute walk test done at initial orientation visit.  The plan for exercise progression was also introduced and progression will be customized based on patient's performance and goals Melanie Rollins has done well in her first week of exercise. Melanie Rollins is progressing well with exercise. Plan is to have [facility] use 2L O2 during exercise as she has dropped below 90 during a session.   [place] Name 03/26/16 1511 04/10/16 1459         Exercise Comments Melanie Rollins has increased her time on the elliptical to 12 min and incline on TM to 1.5. Melanie Rollins is doing well with exercise.  She was not able to wear oxygen during last session due to a cold sore, but otherwise has increased time or resistance on machines.         Discharge Exercise Prescription (Final Exercise Prescription Changes):     Exercise Prescription Changes - 04/10/16 1400      Exercise Review   Progression Yes     Response to Exercise   Blood Pressure (Admit) 110/62   Blood Pressure (Exercise) 138/70   Blood Pressure (Exit) 104/72   Heart Rate (Admit) 83 bpm   Heart Rate (Exercise) 134 bpm   Heart Rate (Exit)  99 bpm   Oxygen Saturation (Admit) 95 %   Oxygen Saturation (Exercise) 84 %   Oxygen Saturation (Exit) 99 %   Rating of Perceived Exertion (Exercise) 15   Perceived Dyspnea (Exercise) 5   Duration Progress to 45 minutes of aerobic exercise without signs/symptoms of physical distress   Intensity THRR unchanged     Progression   Progression Continue to progress workloads to maintain intensity without signs/symptoms of physical distress.     Resistance Training   Training Prescription Yes   Weight 2   Reps 10-12     Interval Training   Interval Training No     Treadmill   MPH 2.8   Grade 0   Minutes 15   METs 3.14     Elliptical   Level 1   Speed 2.6   Minutes 13       Nutrition:  Target Goals: Understanding of nutrition guidelines, daily intake of sodium <1511m, cholesterol <201m calories 30% from fat and 7% or less from saturated fats, daily to have 5 or more servings of fruits and vegetables.  Biometrics:     Pre Biometrics - 02/06/16 1602      Pre Biometrics   Height 5' 4.7" (1.643 m)   Weight 141 lb 11.2 oz (64.3 kg)   Waist Circumference 33.25 inches   Hip Circumference 37.5 inches   Waist to Hip Ratio 0.89 %   BMI (Calculated) 23.8       Nutrition Therapy Plan and Nutrition Goals:   Nutrition Discharge: Rate Your Plate Scores:   Psychosocial: Target Goals: Acknowledge presence or absence of depression, maximize coping skills, provide positive support system. Participant is able to verbalize types and ability to use techniques and skills needed for reducing stress and depression.  Initial Review & Psychosocial Screening:     Initial Psych Review & Screening - 02/06/16 16JulianYes   Comments Melanie Rollins very positive about dealing with her Pulmonary  Fibrosis and has support from her children and husband. She is looking forward to [place].     Barriers   Psychosocial barriers to participate in  program The patient should benefit from training in stress management and relaxation.     Screening Interventions   Interventions Encouraged to exercise      Quality of Life Scores:     Quality of Life - 02/06/16 1602      Quality of Life Scores   Health/Function Pre 24.63 %   Socioeconomic Pre 21 %   Psych/Spiritual Pre 21 %   Family Pre 21 %   GLOBAL Pre 22.66 %      PHQ-9: Recent Review Flowsheet Data    Depression screen [facility] 2/9 02/06/2016 12/13/2015 07/04/2015 06/08/2015   Decreased Interest 0 0 0 0   Down, Depressed, Hopeless 0 0 0 0   PHQ - 2 Score 0 0 0 0   Altered sleeping 1 - - -   Tired, decreased energy 3 - - -   Change in appetite 0 - - -   Feeling bad or failure about yourself  0 - - -   Trouble concentrating 0 - - -   Moving slowly or fidgety/restless 0 - - -   Suicidal thoughts 0 - - -   PHQ-9 Score 4 - - -   Difficult doing work/chores Somewhat difficult - - -      Psychosocial Evaluation and Intervention:     Psychosocial Evaluation - 02/13/16 1055      Psychosocial Evaluation & Interventions   Interventions Encouraged to exercise with the program and follow exercise prescription   Comments Counselor met with Melanie Rollins today for initial psychosocial evaluation.  She is a 70 year old who has Pulmonary Fibrosis.  She has a strong support system with a spouse of 40 years; an adult daughter and friends who live close by and she is also actively involved in her local church.  Melanie Rollins states her health is otherwise pretty good and she sleeps well and has a good appetite.  She reports a history of depression almost a year ago when her mother had some increased health issues.  Melanie Rollins Dr. put her on Sertraline (50 mg) during that time and through her mother's death this past 10-24-2022, she has remained on this medication; which she reports "is working well" for her.  She states her mood is typically positive and she has minimal stress in her life - other than her  health limiting her ability to do simple household chores/responsibilities.  Her goals for this program are to breathe better; to avoid oxygen; and to increase her stamina.  She will return to her [employer] class upon completion of this program.  Counselor will continue to follow with her throughout the course of Pulmonary Rehab.      Psychosocial Re-Evaluation:     Psychosocial Re-Evaluation    [place] Name 03/05/16 1024 03/21/16 1122           Psychosocial Re-Evaluation   Comments Follow up with Melanie Rollins today reporting making some progress since came into this class.  She is noticing getting in and out of her car and up and down with greater ease.  She reports her mood is consistently positive and she is coping better with pacing herself in getting things done around the house; although she is able to do more now and that is positive as well.  Melanie Rollins states she has enjoyed  the educational parts of this program as well.  Counselor commended Melanie. F on her hard work and progress made since coming into this program.   Follow up with Melanie Rollins today stating she is continuing to notice improvement in her core strength with getting up and down.  She states her flexibility has improved as well with ease in turning around and side to side movements.  She plans to do Silver Sneakers following this program.  Her mood and sleep continue to be stable.  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  counselor offered support and encouragement and reminded of stress management techniques to practice during that stressful time especially.          Education: Education Goals: Education classes will be provided on a weekly basis, covering required topics. Participant will state understanding/return demonstration of topics presented.  Learning Barriers/Preferences:   Education Topics: Initial Evaluation Education: - Verbal,  written and demonstration of respiratory meds, RPE/PD scales, oximetry and breathing techniques. Instruction on use of nebulizers and MDIs: cleaning and proper use, rinsing mouth with steroid doses and importance of monitoring MDI activations. Flowsheet Row Pulmonary Rehab from 04/09/2016 in [facility] Cardiac and Pulmonary Rehab  Date  02/06/16  Educator  LB  Instruction Review Code  2- meets goals/outcomes      General Nutrition Guidelines/Fats and Fiber: -Group instruction provided by verbal, written material, models and posters to present the general guidelines for heart healthy nutrition. Gives an explanation and review of dietary fats and fiber. Flowsheet Row Pulmonary Rehab from 04/09/2016 in [facility] Cardiac and Pulmonary Rehab  Date  04/09/16  Educator  CR  Instruction Review Code  2- meets goals/outcomes      Controlling Sodium/Reading Food Labels: -Group verbal and written material supporting the discussion of sodium use in heart healthy nutrition. Review and explanation with models, verbal and written materials for utilization of the food label.   Exercise Physiology & Risk Factors: - Group verbal and written instruction with models to review the exercise physiology of the cardiovascular system and associated critical values. Details cardiovascular disease risk factors and the goals associated with each risk factor. Flowsheet Row Pulmonary Rehab from 04/09/2016 in [facility] Cardiac and Pulmonary Rehab  Date  04/04/16  Educator  AS  Instruction Review Code  2- meets goals/outcomes      Aerobic Exercise & Resistance Training: - Gives group verbal and written discussion on the health impact of inactivity. On the components of aerobic and resistive training programs and the benefits of this training and how to safely progress through these programs.   Flexibility, Balance, General Exercise Guidelines: - Provides group verbal and written instruction on the benefits of flexibility and  balance training programs. Provides general exercise guidelines with specific guidelines to those with heart or lung disease. Demonstration and skill practice provided.   Stress Management: - Provides group verbal and written instruction about the health risks of elevated stress, cause of high stress, and healthy ways to reduce stress.   Depression: - Provides group verbal and written instruction on the correlation between heart/lung disease and depressed mood, treatment options, and the stigmas associated with seeking treatment.   Exercise & Equipment Safety: - Individual verbal instruction and demonstration of equipment use and safety with use of the equipment. Flowsheet Row Pulmonary Rehab from 04/09/2016 in [facility] Cardiac and Pulmonary Rehab  Date  02/13/16  Educator  AS  Instruction Review Code  2- meets goals/outcomes      Infection Prevention: - Provides verbal and written material to individual with discussion of infection control including proper hand washing and proper equipment cleaning during exercise session. Flowsheet Row Pulmonary Rehab from 04/09/2016 in [facility] Cardiac and Pulmonary Rehab  Date  02/13/16  Educator  AS  Instruction Review Code  2- meets goals/outcomes      Falls Prevention: - Provides verbal and written material to individual with discussion of falls prevention and safety. Flowsheet Row Pulmonary Rehab from 04/09/2016 in [facility] Cardiac and Pulmonary Rehab  Date  02/06/16  Educator  LB  Instruction Review Code  2- meets goals/outcomes      Diabetes: - Individual verbal and written instruction to review signs/symptoms of diabetes, desired ranges of glucose level fasting, after meals and with exercise. Advice that pre and post exercise glucose checks will be done for 3 sessions at entry of program.   Chronic Lung Diseases: - Group verbal and written instruction to review new updates, new respiratory medications, new advancements in procedures and  treatments. Provide informative websites and "800" numbers of self-education. Flowsheet Row Pulmonary Rehab from 04/09/2016 in [facility] Cardiac and Pulmonary Rehab  Date  03/05/16  Educator  LB  Instruction Review Code  2- meets goals/outcomes      Lung Procedures: - Group verbal and written instruction to describe testing methods done to diagnose lung disease. Review the outcome of test results. Describe the treatment choices: Pulmonary Function Tests, ABGs and oximetry.   Energy Conservation: - Provide group verbal and written instruction for methods to conserve energy, plan and organize activities. Instruct on pacing techniques, use of adaptive equipment and posture/positioning to relieve shortness of breath. Flowsheet Row Pulmonary Rehab from 04/09/2016 in [facility] Cardiac and Pulmonary Rehab  Date  02/29/16  Educator  Fredderick Erb, EP  Instruction Review Code  2- meets goals/outcomes      Triggers: - Group verbal and written instruction to review types of environmental controls: home humidity, furnaces, filters, dust mite/pet prevention, HEPA vacuums. To discuss weather changes, air quality and the benefits of nasal washing.   Exacerbations: - Group verbal and written instruction to provide: warning signs, infection symptoms, calling MD promptly, preventive modes, and value of vaccinations. Review: effective airway clearance, coughing and/or vibration techniques. Create an Sports administrator.   Oxygen: - Individual and group verbal and written instruction on oxygen therapy. Includes supplement oxygen, available portable oxygen systems, continuous and intermittent flow rates, oxygen safety, concentrators, and Medicare reimbursement for oxygen.   Respiratory Medications: - Group verbal and written instruction to review medications for lung disease. Drug class, frequency, complications, importance of spacers, rinsing mouth after steroid MDI's, and proper cleaning methods for  nebulizers.   AED/CPR: - Group verbal and written instruction with the use of models to demonstrate the basic use of the AED with the basic ABC's of resuscitation. Flowsheet Row Pulmonary Rehab from 04/09/2016 in [facility] Cardiac and Pulmonary Rehab  Date  03/30/16  Educator  C. EnterkinRN  Instruction Review Code  2- meets goals/outcomes      Breathing Retraining: - Provides individuals verbal and written instruction on purpose, frequency, and proper technique of diaphragmatic breathing and pursed-lipped breathing. Applies individual practice skills. Flowsheet Row Pulmonary Rehab from 04/09/2016 in [facility] Cardiac and Pulmonary Rehab  Date  02/13/16  Educator  AS  Instruction Review Code  2- meets goals/outcomes      Anatomy and Physiology of the Lungs: - Group  verbal and written instruction with the use of models to provide basic lung anatomy and physiology related to function, structure and complications of lung disease.   Heart Failure: - Group verbal and written instruction on the basics of heart failure: signs/symptoms, treatments, explanation of ejection fraction, enlarged heart and cardiomyopathy.   Sleep Apnea: - Individual verbal and written instruction to review Obstructive Sleep Apnea. Review of risk factors, methods for diagnosing and types of masks and machines for OSA.   Anxiety: - Provides group, verbal and written instruction on the correlation between heart/lung disease and anxiety, treatment options, and management of anxiety. Flowsheet Row Pulmonary Rehab from 04/09/2016 in [facility] Cardiac and Pulmonary Rehab  Date  02/22/16  Educator  [facility]  Instruction Review Code  2- Meets goals/outcomes      Relaxation: - Provides group, verbal and written instruction about the benefits of relaxation for patients with heart/lung disease. Also provides patients with examples of relaxation techniques. Flowsheet Row Pulmonary Rehab from 04/09/2016 in [facility] Cardiac and Pulmonary  Rehab  Date  03/21/16  Educator  [facility]  Instruction Review Code  2- Meets goals/outcomes      Knowledge Questionnaire Score:     Knowledge Questionnaire Score - 02/06/16 1552      Knowledge Questionnaire Score   Pre Score 9/10       Core Components/Risk Factors/Patient Goals at Admission:     Personal Goals and Risk Factors at Admission - 02/06/16 1558      Core Components/Risk Factors/Patient Goals on Admission    Weight Management Yes   Intervention Weight Management: Develop a combined nutrition and exercise program designed to reach desired caloric intake, while maintaining appropriate intake of nutrient and fiber, sodium and fats, and appropriate energy expenditure required for the weight goal.;Weight Management: Provide education and appropriate resources to help participant work on and attain dietary goals.   Admit Weight 141 lb 11.2 oz (64.3 kg)   Goal Weight: Short Term 138 lb (62.6 kg)   Goal Weight: Long Term 125 lb (56.7 kg)   Expected Outcomes Short Term: Continue to assess and modify interventions until short term weight is achieved;Long Term: Adherence to nutrition and physical activity/exercise program aimed toward attainment of established weight goal;Weight Maintenance: Understanding of the daily nutrition guidelines, which includes 25-35% calories from fat, 7% or less cal from saturated fats, less than 273m cholesterol, less than 1.5gm of sodium, & 5 or more servings of fruits and vegetables daily   Sedentary Yes   Intervention Provide advice, education, support and counseling about physical activity/exercise needs.;Develop an individualized exercise prescription for aerobic and resistive training based on initial evaluation findings, risk stratification, comorbidities and participant's personal goals.  Home Treadmill and Silver Sneaker Classes   Expected Outcomes Achievement of increased cardiorespiratory fitness and enhanced flexibility, muscular endurance and  strength shown through measurements of functional capacity and personal statement of participant.   Increase Strength and Stamina Yes   Intervention Provide advice, education, support and counseling about physical activity/exercise needs.;Develop an individualized exercise prescription for aerobic and resistive training based on initial evaluation findings, risk stratification, comorbidities and participant's personal goals.   Expected Outcomes Achievement of increased cardiorespiratory fitness and enhanced flexibility, muscular endurance and strength shown through measurements of functional capacity and personal statement of participant.   Improve shortness of breath with ADL's Yes   Intervention Provide education, individualized exercise plan and daily activity instruction to help decrease symptoms of SOB with activities of daily living.  Complete housework with less  shortness of breath   Expected Outcomes Short Term: Achieves a reduction of symptoms when performing activities of daily living.   Develop more efficient breathing techniques such as purse lipped breathing and diaphragmatic breathing; and practicing self-pacing with activity Yes   Intervention Provide education, demonstration and support about specific breathing techniuqes utilized for more efficient breathing. Include techniques such as pursed lipped breathing, diaphragmatic breathing and self-pacing activity.   Expected Outcomes Short Term: Participant will be able to demonstrate and use breathing techniques as needed throughout daily activities.      Core Components/Risk Factors/Patient Goals Review:      Goals and Risk Factor Review    Row Name 02/13/16 1419 02/20/16 1456 02/27/16 0922 02/27/16 0932 03/26/16 0616     Core Components/Risk Factors/Patient Goals Review   Personal Goals Review Develop more efficient breathing techniques such as purse lipped breathing and diaphragmatic breathing and practicing self-pacing with  activity. Develop more efficient breathing techniques such as purse lipped breathing and diaphragmatic breathing and practicing self-pacing with activity.;Other Sedentary;Improve shortness of breath with ADL's  -- Increase knowledge of respiratory medications and ability to use respiratory devices properly.;Develop more efficient breathing techniques such as purse lipped breathing and diaphragmatic breathing and practicing self-pacing with activity.;Improve shortness of breath with ADL's;Increase Strength and Stamina;Sedentary;Weight Management/Obesity   Review Pursed lip breathing techniques were discussed with the patient. He patient demonstrated understanding of these techniques.  Melanie Rollins was cued to perform PLB after her EL exercise. She had increased her time to 36mns from set work and benefited from the PLB to recover from her shortness of breath. Also I gave her a copy of the Pulmonary Fibrosis Handbook from the [employer].  Unfortunately she does not drive at night and can not attend the PF Support Group. Melanie Rollins she has noticed an improvement in certain activities, such as getting up from a chair, vaccuming, and getting out of her car. She has performed PLB for mahy years and knows the benefit of the technique. [facility] day of the [employer]. Melanie Rollins she has noticed an improvement in certain activities, such as getting up from a chair, vaccuming, and getting out of her car. She has performed PLB for many years and knows the benefit of the technique. [facility] the [employer]. Melanie FPankowskiis progressing with her exercise goals and continues with her activity at home. She is checking her O2Sat's with her oximeter and stops her activity, such as dusting or making the bed, when her shortness of breaath increases and O2Sat's are low. During exercise on the TM and EL, we have been using  2l/m of oxygen to improve O2Sat's and work of breathing. Melanie FMeridahas improved mid671m by [id number] has also lost 2 pounds - her goal is 10lbs. I recently gave her the 2017 Portable Concentrator Guide, so Melanie FuqLyversn prepare for portable oxygen therapy.    Expected Outcomes Patient will use pursed lip breathing during exercise and daily activities to control shortness of breath.   --  --  --  --   Row Name 04/11/16 1328             Core Components/Risk Factors/Patient Goals Review   Personal Goals Review Increase knowledge of respiratory medications and ability to use respiratory devices properly.       Review Reviewed portable concentrators and have decided  to use our concentrator for Melanie Effinger's exercise goals. She can assess the difference between intermittent and continuous and this will help her make a decision on which concentrator the buy.          Core Components/Risk Factors/Patient Goals at Discharge (Final Review):      Goals and Risk Factor Review - 04/11/16 1328      Core Components/Risk Factors/Patient Goals Review   Personal Goals Review Increase knowledge of respiratory medications and ability to use respiratory devices properly.   Review Reviewed portable concentrators and have decided to use our concentrator for Melanie Rorabaugh's exercise goals. She can assess the difference between intermittent and continuous and this will help her make a decision on which concentrator the buy.      ITP Comments:     ITP Comments    Row Name 02/15/16 1233           ITP Comments Cartha has done well in her first week of exercise.          Comments: 30 day note review

## 2016-04-18 DIAGNOSIS — J8410 Pulmonary fibrosis, unspecified: Secondary | ICD-10-CM | POA: Diagnosis not present

## 2016-04-18 NOTE — Progress Notes (Signed)
Daily Session Note  Patient Details  Name: SAYANA SALLEY MRN: [id number] Date of Birth: [birth year] Referring Provider:   Flowsheet Row Pulmonary Rehab from 02/06/2016 in [facility] Cardiac and Pulmonary Rehab  Referring Provider  Wallene Huh MD      Encounter Date: 04/18/2016  Check In:     Session Check In - 04/18/16 1140      Check-In   Location [facility]-Cardiac & Pulmonary Rehab   Staff Present Carson Myrtle, BS, RRT, Respiratory Therapist;Carroll Enterkin, RN, Vickki Hearing, BA, ACSM CEP, Exercise Physiologist   Supervising physician immediately available to respond to emergencies [facility] immediately available ER MD   Physician(s) Jimmye Norman and Reita Cliche   Medication changes reported     No   Fall or balance concerns reported    No   Warm-up and Cool-down Performed as group-led Location manager Performed Yes   VAD Patient? No     Pain Assessment   Currently in Pain? No/denies   Multiple Pain Sites No         Goals Met:  Proper associated with RPD/PD & O2 Sat Independence with exercise equipment Exercise tolerated well Strength training completed today  Goals Unmet:  Not Applicable  Comments: Pt able to follow exercise prescription today without complaint.  Will continue to monitor for progression.    Dr. Emily Filbert is Medical Director for [place] and [facility].

## 2016-04-20 ENCOUNTER — Encounter: Payer: Medicare Other | Attending: Specialist | Admitting: *Deleted

## 2016-04-20 DIAGNOSIS — J8410 Pulmonary fibrosis, unspecified: Secondary | ICD-10-CM | POA: Diagnosis not present

## 2016-04-20 NOTE — Progress Notes (Signed)
Daily Session Note  Patient Details  Name: Melanie Rollins MRN: [id number] Date of Birth: 1946-03-29 Referring Provider:   Flowsheet Row Pulmonary Rehab from 02/06/2016 in [facility] Cardiac and Pulmonary Rehab  Referring Provider  Wallene Huh MD      Encounter Date: 04/20/2016  Check In:     Session Check In - 04/20/16 1044      Check-In   Location [facility]-Cardiac & Pulmonary Rehab   Staff Present Gerlene Burdock, RN, Vickki Hearing, BA, ACSM CEP, Exercise Physiologist;Other  Levell July, RN BSN   Supervising physician immediately available to respond to emergencies [facility] immediately available ER MD   Physician(s) Drs. Alfred Levins and [employer]   Medication changes reported     No   Fall or balance concerns reported    No   Warm-up and Cool-down Performed as group-led Location manager Performed Yes   VAD Patient? No     Pain Assessment   Currently in Pain? No/denies   Multiple Pain Sites No         Goals Met:  Proper associated with RPD/PD & O2 Sat Independence with exercise equipment Using PLB without cueing & demonstrates good technique No report of cardiac concerns or symptoms Strength training completed today  Goals Unmet:  Not Applicable  Comments: Pt able to follow exercise prescription today without complaint.  Will continue to monitor for progression.    Dr. Emily Filbert is Medical Director for [place] and [facility].

## 2016-04-23 ENCOUNTER — Encounter: Payer: Medicare Other | Admitting: *Deleted

## 2016-04-23 DIAGNOSIS — J8410 Pulmonary fibrosis, unspecified: Secondary | ICD-10-CM

## 2016-04-23 NOTE — Progress Notes (Signed)
Daily Session Note  Patient Details  Name: Melanie Rollins MRN: [id number] Date of Birth: August 26, 1945 Referring Provider:   Flowsheet Row Pulmonary Rehab from 02/06/2016 in [facility] Cardiac and Pulmonary Rehab  Referring Provider  Wallene Huh MD      Encounter Date: 04/23/2016  Check In:     Session Check In - 04/23/16 1119      Check-In   Location [facility]-Cardiac & Pulmonary Rehab   Staff Present Earlean Shawl, BS, ACSM CEP, Exercise Physiologist;Amanda Oletta Darter, BA, ACSM CEP, Exercise Physiologist;Laureen Owens Shark, BS, RRT, Respiratory Therapist   Supervising physician immediately available to respond to emergencies [facility] immediately available ER MD   Physician(s) Jimmye Norman and Mariea Clonts   Medication changes reported     No   Fall or balance concerns reported    No   Warm-up and Cool-down Performed on first and last piece of equipment   Resistance Training Performed Yes   VAD Patient? No     Pain Assessment   Currently in Pain? No/denies   Multiple Pain Sites No         Goals Met:  Proper associated with RPD/PD & O2 Sat Independence with exercise equipment Exercise tolerated well Strength training completed today  Goals Unmet:  Not Applicable  Comments: Pt able to follow exercise prescription today without complaint.  Will continue to monitor for progression.    Dr. Emily Filbert is Medical Director for [place] and [facility].

## 2016-04-25 DIAGNOSIS — J8410 Pulmonary fibrosis, unspecified: Secondary | ICD-10-CM | POA: Diagnosis not present

## 2016-04-25 NOTE — Progress Notes (Signed)
Pulmonary Individual Treatment Plan  Patient Details  Name: Melanie Rollins MRN: [id number] Date of Birth: Jun 25, 1945 Referring Provider:   Flowsheet Row Pulmonary Rehab from 02/06/2016 in [facility] Cardiac and Pulmonary Rehab  Referring Provider  Wallene Huh MD      Initial Encounter Date:  Flowsheet Row Pulmonary Rehab from 02/06/2016 in [facility] Cardiac and Pulmonary Rehab  Date  02/06/16  Referring Provider  Wallene Huh MD      Visit Diagnosis: Pulmonary fibrosis ([employer])  Patient's Home Medications on Admission:  Current Outpatient Prescriptions:    Acetylcysteine (NAC) 600 MG CAPS, Take by mouth. Take 2 tablets daily, Disp: , Rfl:    albuterol (PROVENTIL HFA;VENTOLIN HFA) 108 (90 BASE) MCG/ACT inhaler, Inhale 2 puffs into the lungs every 6 (six) hours as needed for wheezing or shortness of breath., Disp: 1 Inhaler, Rfl: 0   alendronate (FOSAMAX) 70 MG tablet, Take 70 mg by mouth once a week. Take with a full glass of water on an empty stomach., Disp: , Rfl:    Calcium Citrate-Vitamin D (CALCIUM + D PO), Take 600 mg by mouth daily., Disp: , Rfl:    CINNAMON PO, Take 1,000 mg by mouth daily., Disp: , Rfl:    conjugated estrogens (PREMARIN) vaginal cream, Place 1 Applicatorful vaginally 2 (two) times a week. Reported on 05/11/2015, Disp: 320 g, Rfl: 2   hydroxychloroquine (PLAQUENIL) 200 MG tablet, Take by mouth. Take 2 tablets once a day, Disp: , Rfl:    L-Lysine 500 MG TABS, Take 500 mg by mouth daily., Disp: , Rfl:    lansoprazole (PREVACID) 15 MG capsule, Take 15 mg by mouth daily at 12 noon., Disp: , Rfl:    Magnesium 250 MG TABS, Take by mouth daily., Disp: , Rfl:    Multiple Vitamin (MULTIVITAMIN) tablet, Take 1 tablet by mouth daily., Disp: , Rfl:    Omega-3 Fatty Acids (FISH OIL) 1000 MG CAPS, Take 1,000 mg by mouth daily., Disp: , Rfl:    predniSONE (DELTASONE) 5 MG tablet, Take 5 mg by mouth daily with breakfast., Disp: , Rfl:    sertraline (ZOLOFT) 50 MG  tablet, Take 1 tablet (50 mg total) by mouth daily. 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[place])     Tobacco Use: History  Smoking Status   Never Smoker  Smokeless Tobacco   Never Used    Labs: Recent Review Flowsheet Data    Labs for ITP Cardiac and Pulmonary Rehab Latest Ref Rng & Units 07/04/2015   Cholestrol 100 - 199 mg/dL 198   LDLCALC 0 - 99 mg/dL 91   HDL >39 mg/dL 79   Trlycerides 0 - 149 mg/dL 142       ADL UCSD:     Pulmonary Assessment Scores    Row Name 02/06/16 1555 03/26/16 0628       ADL UCSD   ADL Phase Entry Mid    SOB Score total 46 51    Rest 0 0    Walk 2 2    Stairs 3 4    Bath 1 1    Dress 1 2    Shop 2 2       Pulmonary Function Assessment:     Pulmonary Function Assessment - 02/06/16 1553  Pulmonary Function Tests   FVC% 80 %   FEV1% 98 %   FEV1/FVC Ratio 93.74   RV% 28 %   DLCO% 61 %     Initial Spirometry Results   Comments Spirometry 02/06/16; DLCO and TLC 01/03/16     Breath   Bilateral Breath Sounds Decreased;Rales;Basilar   Shortness of Breath Yes;Limiting activity      Exercise Target Goals:    Exercise Program Goal: Individual exercise prescription set with THRR, safety & activity barriers. Participant demonstrates ability to understand and report RPE using BORG scale, to self-measure pulse accurately, and to acknowledge the importance of the exercise prescription.  Exercise Prescription Goal: Starting with aerobic activity 30 plus minutes a day, 3 days per week for initial exercise prescription. Provide home exercise prescription and guidelines that participant acknowledges understanding prior to discharge.  Activity Barriers & Risk Stratification:     Activity Barriers & Cardiac Risk Stratification -  02/06/16 1552      Activity Barriers & Cardiac Risk Stratification   Activity Barriers Arthritis;Shortness of Breath;Deconditioning   Cardiac Risk Stratification Moderate      6 Minute Walk:     6 Minute Walk    Row Name 02/06/16 1556 03/21/16 1105       6 Minute Walk   Phase Initial Mid Program    Distance 1275 feet 1525 feet    Distance % Change  -- 16.4 %    Walk Time 4.97 minutes 6 minutes    # of Rest Breaks 6  For oxygen desaturation: 7 sec, 6 sec, 10 sec, 11 sec, 12 sec, 16 sec 0    MPH 2.92 2.9    METS 2.98 2.9    RPE 13 13    Perceived Dyspnea  4  --    VO2 Peak 10.44 13.47    Symptoms No No    Resting HR 62 bpm 81 bpm    Resting BP 116/70 104/64    Max Ex. HR 95 bpm 128 bpm    Max Ex. BP 126/64 142/62    2 Minute Post BP 126/60  --      Interval HR   Baseline HR 62 81    1 Minute HR 70 104    2 Minute HR 90 115    3 Minute HR 93 119    4 Minute HR 95 119    5 Minute HR 90 118    6 Minute HR 88 128    2 Minute Post HR 62 93    Interval Heart Rate? Yes Yes      Interval Oxygen   Interval Oxygen? Yes  --    Baseline Oxygen Saturation % 99 % 95 %    Baseline Liters of Oxygen 0 L  Room Air  --    1 Minute Oxygen Saturation % 78 %  quick recovery to 94%  95 %    1 Minute Liters of Oxygen 0 L  --    2 Minute Oxygen Saturation % 80 % 90 %    2 Minute Liters of Oxygen 0 L  --    3 Minute Oxygen Saturation % 91 %  80% at 2:44 87 %    3 Minute Liters of Oxygen 0 L  --    4 Minute Oxygen Saturation % 95 %  77% at 3:34 89 %    4 Minute Liters of Oxygen 0 L  --    5 Minute Oxygen Saturation % 92 %  79% at 4:27 89 %    5 Minute Liters of Oxygen 0 L  --    6 Minute Oxygen Saturation % 85 %  81% at 5:14 85 %    6 Minute Liters of Oxygen 0 L  --    2 Minute Post Oxygen Saturation % 100 % 91 %    2 Minute Post Liters of Oxygen 0 L  --       Initial Exercise Prescription:     Initial Exercise Prescription - 02/06/16 1500      Date of Initial Exercise  RX and Referring Provider   Date 02/06/16   Referring Provider Wallene Huh MD     Treadmill   MPH 2.6   Grade 0.5   Minutes 15  2 min intervals (oxygen saturation dependent)   METs 3.71     Elliptical   Level 1   Speed 2.6   Minutes 15     T5 Nustep   Level 2   Watts --  60-80 spm   Minutes 15   METs 2     Prescription Details   Frequency (times per week) 3   Duration Progress to 45 minutes of aerobic exercise without signs/symptoms of physical distress     Intensity   THRR 40-80% of Max Heartrate 98-133   Ratings of Perceived Exertion 11-13   Perceived Dyspnea 0-4     Progression   Progression Continue to progress workloads to maintain intensity without signs/symptoms of physical distress.     Resistance Training   Training Prescription Yes   Weight 2 lbs   Reps 10-12      Perform Capillary Blood Glucose checks as needed.  Exercise Prescription Changes:     Exercise Prescription Changes    Row Name 02/13/16 1400 02/22/16 1200 03/01/16 1200 03/15/16 1000 03/26/16 1500     Exercise Review   Progression  --  -- Yes Yes Yes     Response to Exercise   Blood Pressure (Admit) 100/60  -- 110/58 138/62 102/56   Blood Pressure (Exercise) 130/70  -- 128/64 124/56 120/58   Blood Pressure (Exit) 107/60  -- 124/60 114/63 106/64   Heart Rate (Admit) 63 bpm  -- 74 bpm 82 bpm 93 bpm   Heart Rate (Exercise) 126 bpm  -- 132 bpm 125 bpm 133 bpm   Heart Rate (Exit) 73 bpm  -- 76 bpm 89 bpm 83 bpm   Oxygen Saturation (Admit) 98 %  -- 96 % 99 % 94 %   Oxygen Saturation (Exercise) 91 %  -- 89 % 90 % 91 %   Oxygen Saturation (Exit) 98 %  -- 99 % 96 % 97 %   Rating of Perceived Exertion (Exercise) 13  -- _0 Perceived Dyspnea (Exercise) 4  -- _1 Symptoms none  -- one none none   Duration Progress to 30 minutes of continuous aerobic without signs/symptoms of physical distress  -- Progress to 45 minutes of aerobic exercise without signs/symptoms of physical  distress Progress to 45 minutes of aerobic exercise without signs/symptoms of physical distress  --   Intensity THRR unchanged  98-133  -- THRR unchanged THRR unchanged  --     Progression   Progression  --  --  -- Continue to progress workloads to maintain intensity without signs/symptoms of physical distress.  --     Horticulturist, commercial Prescription Yes  -- Yes Yes Yes  Weight 2 lbs  -- _0 Reps 10-12  -- 10-15 10-12 10-12     Interval Training   Interval Training  --  -- No No No     Treadmill   MPH 2.6  -- 2.8 2.8 2.8   Grade 0.5  -- 0.5 0.5 1.5   Minutes 15  2 min intervals (oxygen saturation dependent)  -- _1 METs 3.71  -- 3.34 3.34 3.72     Elliptical   Level 1  -- _2 Speed 2.6  -- 2.6 2.5 2.5   Minutes 15  -- _3 T5 Nustep   Level 2  -- _4 Watts --  60-80 spm  --  --  --  --   Minutes 15  -- _5 METs 2  -- 2  --  --     Home Exercise Plan   Plans to continue exercise at  -- [facility]  --  --  --   Frequency  -- Add 2 additional days to program exercise sessions.  [employer] classes  --  --  --   Row Name 03/30/16 0800 04/10/16 1400 04/25/16 1200         Exercise Review   Progression Yes Yes Yes       Response to Exercise   Blood Pressure (Admit)  -- 110/62 100/60     Blood Pressure (Exercise)  -- 138/70 132/64     Blood Pressure (Exit)  -- 104/72 92/52     Heart Rate (Admit)  -- 83 bpm 75 bpm     Heart Rate (Exercise)  -- 134 bpm 110 bpm     Heart Rate (Exit)  -- 99 bpm 72 bpm     Oxygen Saturation (Admit)  -- 95 % 99 %     Oxygen Saturation (Exercise)  -- 84 % 93 %     Oxygen Saturation (Exit)  -- 99 % 99 %     Rating of Perceived Exertion (Exercise)  -- 15 12     Perceived Dyspnea (Exercise)  -- 5 3     Duration  -- Progress to 45 minutes of aerobic exercise without signs/symptoms of physical distress Progress to 45 minutes of aerobic exercise without signs/symptoms of physical distress      Intensity  -- THRR unchanged THRR unchanged       Progression   Progression  -- Continue to progress workloads to maintain intensity without signs/symptoms of physical distress. Continue to progress workloads to maintain intensity without signs/symptoms of physical distress.       Resistance Training   Training Prescription  -- Yes Yes     Weight  -- 2 3     Reps  -- 10-12 10-12       Interval Training   Interval Training  -- No No       Oxygen   Oxygen  --  -- Continuous     Liters  --  -- 2  TM only       Treadmill   MPH  -- 2.8 2.8     Grade  -- 0 1.5     Minutes  -- 15 15     METs  -- 3.14 3.72       Elliptical   Level  -- 1  --     Speed  --  2.6  --     Minutes  -- 13  --       Home Exercise Plan   Plans to continue exercise at  --  -- [employer] (comment)     Frequency  --  -- Add 2 additional days to program exercise sessions.  [employer]        Exercise Comments:     Exercise Comments    Row Name 02/06/16 1602 02/13/16 1424 02/15/16 1232 03/01/16 1238 03/14/16 1252   Exercise Comments Marland Mcalpine wants to be able to clean the house without getting overly tired First full day of exercise!  Patient was oriented to gym and equipment including functions, settings, policies, and procedures.  Patient's individual exercise prescription and treatment plan were reviewed.  All starting workloads were established based on the results of the 6 minute walk test done at initial orientation visit.  The plan for exercise progression was also introduced and progression will be customized based on patient's performance and goals Avaiyah has done well in her first week of exercise. Jasilyn is progressing well with exercise. Plan is to have [facility] use 2L O2 during exercise as she has dropped below 90 during a session.   [place] Name 03/26/16 1511 04/10/16 1459 04/25/16 1224       Exercise Comments Kieran has increased her time on the elliptical to 12 min and incline  on TM to 1.5. Shalla is doing well with exercise.  She was not able to wear oxygen during last session due to a cold sore, but otherwise has increased time or resistance on machines. Barbaras oxygen levels have stayed within normal limits since adding 2L to TM and/or Elliptical.          Discharge Exercise Prescription (Final Exercise Prescription Changes):     Exercise Prescription Changes - 04/25/16 1200      Exercise Review   Progression Yes     Response to Exercise   Blood Pressure (Admit) 100/60   Blood Pressure (Exercise) 132/64   Blood Pressure (Exit) 92/52   Heart Rate (Admit) 75 bpm   Heart Rate (Exercise) 110 bpm   Heart Rate (Exit) 72 bpm   Oxygen Saturation (Admit) 99 %   Oxygen Saturation (Exercise) 93 %   Oxygen Saturation (Exit) 99 %   Rating of Perceived Exertion (Exercise) 12   Perceived Dyspnea (Exercise) 3   Duration Progress to 45 minutes of aerobic exercise without signs/symptoms of physical distress   Intensity THRR unchanged     Progression   Progression Continue to progress workloads to maintain intensity without signs/symptoms of physical distress.     Resistance Training   Training Prescription Yes   Weight 3   Reps 10-12     Interval Training   Interval Training No     Oxygen   Oxygen Continuous   Liters 2  TM only     Treadmill   MPH 2.8   Grade 1.5   Minutes 15   METs 3.72     Home Exercise Plan   Plans to continue exercise at [employer] (comment)   Frequency Add 2 additional days to program exercise sessions.  T[employer]       Nutrition:  Target Goals: Understanding of nutrition guidelines, daily intake of sodium <1559m, cholesterol <2079m calories 30% from fat and 7% or less from saturated fats, daily to have 5 or more servings of fruits and vegetables.  Biometrics:     Pre Biometrics -  02/06/16 1602      Pre Biometrics   Height 5' 4.7" (1.643 m)   Weight 141 lb 11.2 oz (64.3 kg)   Waist  Circumference 33.25 inches   Hip Circumference 37.5 inches   Waist to Hip Ratio 0.89 %   BMI (Calculated) 23.8       Nutrition Therapy Plan and Nutrition Goals:   Nutrition Discharge: Rate Your Plate Scores:   Psychosocial: Target Goals: Acknowledge presence or absence of depression, maximize coping skills, provide positive support system. Participant is able to verbalize types and ability to use techniques and skills needed for reducing stress and depression.  Initial Review & Psychosocial Screening:     Initial Psych Review & Screening - 02/06/16 [place]? Yes   Comments Ms Fors is very positive about dealing with her Pulmonary Fibrosis and has support from her children and husband. She is looking forward to [place].     Barriers   Psychosocial barriers to participate in program The patient should benefit from training in stress management and relaxation.     Screening Interventions   Interventions Encouraged to exercise      Quality of Life Scores:     Quality of Life - 02/06/16 1602      Quality of Life Scores   Health/Function Pre 24.63 %   Socioeconomic Pre 21 %   Psych/Spiritual Pre 21 %   Family Pre 21 %   GLOBAL Pre 22.66 %      PHQ-9: Recent Review Flowsheet Data    Depression screen [facility] 2/9 02/06/2016 12/13/2015 07/04/2015 06/08/2015   Decreased Interest 0 0 0 0   Down, Depressed, Hopeless 0 0 0 0   PHQ - 2 Score 0 0 0 0   Altered sleeping 1 - - -   Tired, decreased energy 3 - - -   Change in appetite 0 - - -   Feeling bad or failure about yourself  0 - - -   Trouble concentrating 0 - - -   Moving slowly or fidgety/restless 0 - - -   Suicidal thoughts 0 - - -   PHQ-9 Score 4 - - -   Difficult doing work/chores Somewhat difficult - - -      Psychosocial Evaluation and Intervention:     Psychosocial Evaluation - 02/13/16 1055      Psychosocial Evaluation & Interventions   Interventions Encouraged to  exercise with the program and follow exercise prescription   Comments Counselor met with Ms. Arriola today for initial psychosocial evaluation.  She is a 70 year old who has Pulmonary Fibrosis.  She has a strong support system with a spouse of 64 years; an adult daughter and friends who live close by and she is also actively involved in her local church.  Ms. Christian states her health is otherwise pretty good and she sleeps well and has a good appetite.  She reports a history of depression almost a year ago when her mother had some increased health issues.  Ms. Gerre Pebbles Dr. put her on Sertraline (50 mg) during that time and through her mother's death this past 10-18-22, she has remained on this medication; which she reports "is working well" for her.  She states her mood is typically positive and she has minimal stress in her life - other than her health limiting her ability to do simple household chores/responsibilities.  Her goals for this program are to breathe  better; to avoid oxygen; and to increase her stamina.  She will return to her [employer] class upon completion of this program.  Counselor will continue to follow with her throughout the course of Pulmonary Rehab.      Psychosocial Re-Evaluation:     Psychosocial Re-Evaluation    [place] Name 03/05/16 1024 03/21/16 1122 04/18/16 1130         Psychosocial Re-Evaluation   Comments Follow up with Ms. Allport today reporting making some progress since came into this class.  She is noticing getting in and out of her car and up and down with greater ease.  She reports her mood is consistently positive and she is coping better with pacing herself in getting things done around the house; although she is able to do more now and that is positive as well.  Ms. Wanda Plump states she has enjoyed the educational parts of this program as well.  Counselor commended Ms. F on her hard work and progress made since coming into this program.   Follow up with Ms Eberlin today stating she  is continuing to notice improvement in her core strength with getting up and down.  She states her flexibility has improved as well with ease in turning around and side to side movements.  She plans to do [employer] following this program.  Her mood and sleep continue to be stable.  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  counselor offered support and encouragement and reminded of stress management techniques to practice during that stressful time especially.   Follow up with Ms. Heuerman today stating she went to the beach for [place] with family and was feeling stronger and better.  She continues to experience increase in core strength with her legs particularly feeling stronger.  Her sleep and appetite remain stable and she is handling the stress of job demands better by exercise and improved self-care.  She plans to continue exercising at [employer] at [facility] upon discharge and she has a treadmill and videos she is committed to using more frequently now that she has experienced such progress.  Counselor commended her on her progress and commitment to healthier living.         Education: Education Goals: Education classes will be provided on a weekly basis, covering required topics. Participant will state understanding/return demonstration of topics presented.  Learning Barriers/Preferences:   Education Topics: Initial Evaluation Education: - Verbal, written and demonstration of respiratory meds, RPE/PD scales, oximetry and breathing techniques. Instruction on use of nebulizers and MDIs: cleaning and proper use, rinsing mouth with steroid doses and importance of monitoring MDI activations. Flowsheet Row Pulmonary Rehab from 04/25/2016 in [facility] Cardiac and Pulmonary Rehab  Date  02/06/16  Educator  LB  Instruction Review Code  2- meets goals/outcomes      General Nutrition Guidelines/Fats and  Fiber: -Group instruction provided by verbal, written material, models and posters to present the general guidelines for heart healthy nutrition. Gives an explanation and review of dietary fats and fiber. Flowsheet Row Pulmonary Rehab from 04/25/2016 in [facility] Cardiac and Pulmonary Rehab  Date  04/09/16  Educator  CR  Instruction Review Code  2- meets goals/outcomes      Controlling Sodium/Reading Food Labels: -Group verbal and written material supporting the discussion of sodium use in heart healthy nutrition. Review and explanation with models, verbal and written materials for utilization  of the food label. Flowsheet Row Pulmonary Rehab from 04/25/2016 in [facility] Cardiac and Pulmonary Rehab  Date  04/16/16  Educator  CR  Instruction Review Code  2- meets goals/outcomes      Exercise Physiology & Risk Factors: - Group verbal and written instruction with models to review the exercise physiology of the cardiovascular system and associated critical values. Details cardiovascular disease risk factors and the goals associated with each risk factor. Flowsheet Row Pulmonary Rehab from 04/25/2016 in [facility] Cardiac and Pulmonary Rehab  Date  04/04/16  Educator  AS  Instruction Review Code  2- meets goals/outcomes      Aerobic Exercise & Resistance Training: - Gives group verbal and written discussion on the health impact of inactivity. On the components of aerobic and resistive training programs and the benefits of this training and how to safely progress through these programs.   Flexibility, Balance, General Exercise Guidelines: - Provides group verbal and written instruction on the benefits of flexibility and balance training programs. Provides general exercise guidelines with specific guidelines to those with heart or lung disease. Demonstration and skill practice provided.   Stress Management: - Provides group verbal and written instruction about the health risks of elevated stress, cause of  high stress, and healthy ways to reduce stress.   Depression: - Provides group verbal and written instruction on the correlation between heart/lung disease and depressed mood, treatment options, and the stigmas associated with seeking treatment. Flowsheet Row Pulmonary Rehab from 04/25/2016 in [facility] Cardiac and Pulmonary Rehab  Date  04/23/16  Educator  [facility]  Instruction Review Code  2- meets goals/outcomes      Exercise & Equipment Safety: - Individual verbal instruction and demonstration of equipment use and safety with use of the equipment. Flowsheet Row Pulmonary Rehab from 04/25/2016 in [facility] Cardiac and Pulmonary Rehab  Date  02/13/16  Educator  AS  Instruction Review Code  2- meets goals/outcomes      Infection Prevention: - Provides verbal and written material to individual with discussion of infection control including proper hand washing and proper equipment cleaning during exercise session. Flowsheet Row Pulmonary Rehab from 04/25/2016 in [facility] Cardiac and Pulmonary Rehab  Date  02/13/16  Educator  AS  Instruction Review Code  2- meets goals/outcomes      Falls Prevention: - Provides verbal and written material to individual with discussion of falls prevention and safety. Flowsheet Row Pulmonary Rehab from 04/25/2016 in [facility] Cardiac and Pulmonary Rehab  Date  02/06/16  Educator  LB  Instruction Review Code  2- meets goals/outcomes      Diabetes: - Individual verbal and written instruction to review signs/symptoms of diabetes, desired ranges of glucose level fasting, after meals and with exercise. Advice that pre and post exercise glucose checks will be done for 3 sessions at entry of program.   Chronic Lung Diseases: - Group verbal and written instruction to review new updates, new respiratory medications, new advancements in procedures and treatments. Provide informative websites and "800" numbers of self-education. Flowsheet Row Pulmonary Rehab from 04/25/2016 in [facility]  Cardiac and Pulmonary Rehab  Date  03/05/16  Educator  LB  Instruction Review Code  2- meets goals/outcomes      Lung Procedures: - Group verbal and written instruction to describe testing methods done to diagnose lung disease. Review the outcome of test results. Describe the treatment choices: Pulmonary Function Tests, ABGs and oximetry.   Energy Conservation: - Provide group verbal and written instruction for methods to conserve energy,  plan and organize activities. Instruct on pacing techniques, use of adaptive equipment and posture/positioning to relieve shortness of breath. Flowsheet Row Pulmonary Rehab from 04/25/2016 in [facility] Cardiac and Pulmonary Rehab  Date  02/29/16  Educator  Fredderick Erb, EP  Instruction Review Code  2- meets goals/outcomes      Triggers: - Group verbal and written instruction to review types of environmental controls: home humidity, furnaces, filters, dust mite/pet prevention, HEPA vacuums. To discuss weather changes, air quality and the benefits of nasal washing.   Exacerbations: - Group verbal and written instruction to provide: warning signs, infection symptoms, calling MD promptly, preventive modes, and value of vaccinations. Review: effective airway clearance, coughing and/or vibration techniques. Create an Sports administrator. Flowsheet Row Pulmonary Rehab from 04/25/2016 in [facility] Cardiac and Pulmonary Rehab  Date  04/25/16  Educator  LB  Instruction Review Code  2- meets goals/outcomes      Oxygen: - Individual and group verbal and written instruction on oxygen therapy. Includes supplement oxygen, available portable oxygen systems, continuous and intermittent flow rates, oxygen safety, concentrators, and Medicare reimbursement for oxygen.   Respiratory Medications: - Group verbal and written instruction to review medications for lung disease. Drug class, frequency, complications, importance of spacers, rinsing mouth after steroid MDI's, and proper cleaning  methods for nebulizers.   AED/CPR: - Group verbal and written instruction with the use of models to demonstrate the basic use of the AED with the basic ABC's of resuscitation. Flowsheet Row Pulmonary Rehab from 04/25/2016 in [facility] Cardiac and Pulmonary Rehab  Date  03/30/16  Educator  C. EnterkinRN  Instruction Review Code  2- meets goals/outcomes      Breathing Retraining: - Provides individuals verbal and written instruction on purpose, frequency, and proper technique of diaphragmatic breathing and pursed-lipped breathing. Applies individual practice skills. Flowsheet Row Pulmonary Rehab from 04/25/2016 in [facility] Cardiac and Pulmonary Rehab  Date  02/13/16  Educator  AS  Instruction Review Code  2- meets goals/outcomes      Anatomy and Physiology of the Lungs: - Group verbal and written instruction with the use of models to provide basic lung anatomy and physiology related to function, structure and complications of lung disease. Flowsheet Row Pulmonary Rehab from 04/25/2016 in [facility] Cardiac and Pulmonary Rehab  Date  04/18/16  Educator  LB  Instruction Review Code  2- meets goals/outcomes      Heart Failure: - Group verbal and written instruction on the basics of heart failure: signs/symptoms, treatments, explanation of ejection fraction, enlarged heart and cardiomyopathy.   Sleep Apnea: - Individual verbal and written instruction to review Obstructive Sleep Apnea. Review of risk factors, methods for diagnosing and types of masks and machines for OSA.   Anxiety: - Provides group, verbal and written instruction on the correlation between heart/lung disease and anxiety, treatment options, and management of anxiety. Flowsheet Row Pulmonary Rehab from 04/25/2016 in [facility] Cardiac and Pulmonary Rehab  Date  02/22/16  Educator  [facility]  Instruction Review Code  2- Meets goals/outcomes      Relaxation: - Provides group, verbal and written instruction about the benefits of relaxation for  patients with heart/lung disease. Also provides patients with examples of relaxation techniques. Flowsheet Row Pulmonary Rehab from 04/25/2016 in [facility] Cardiac and Pulmonary Rehab  Date  03/21/16  Educator  [facility]  Instruction Review Code  2- Meets goals/outcomes      Knowledge Questionnaire Score:     Knowledge Questionnaire Score - 02/06/16 1552      Knowledge  Questionnaire Score   Pre Score 9/10       Core Components/Risk Factors/Patient Goals at Admission:     Personal Goals and Risk Factors at Admission - 02/06/16 1558      Core Components/Risk Factors/Patient Goals on Admission    Weight Management Yes   Intervention Weight Management: Develop a combined nutrition and exercise program designed to reach desired caloric intake, while maintaining appropriate intake of nutrient and fiber, sodium and fats, and appropriate energy expenditure required for the weight goal.;Weight Management: Provide education and appropriate resources to help participant work on and attain dietary goals.   Admit Weight 141 lb 11.2 oz (64.3 kg)   Goal Weight: Short Term 138 lb (62.6 kg)   Goal Weight: Long Term 125 lb (56.7 kg)   Expected Outcomes Short Term: Continue to assess and modify interventions until short term weight is achieved;Long Term: Adherence to nutrition and physical activity/exercise program aimed toward attainment of established weight goal;Weight Maintenance: Understanding of the daily nutrition guidelines, which includes 25-35% calories from fat, 7% or less cal from saturated fats, less than 270m cholesterol, less than 1.5gm of sodium, & 5 or more servings of fruits and vegetables daily   Sedentary Yes   Intervention Provide advice, education, support and counseling about physical activity/exercise needs.;Develop an individualized exercise prescription for aerobic and resistive training based on initial evaluation findings, risk stratification, comorbidities and participant's personal  goals.  Home Treadmill and Silver Sneaker Classes   Expected Outcomes Achievement of increased cardiorespiratory fitness and enhanced flexibility, muscular endurance and strength shown through measurements of functional capacity and personal statement of participant.   Increase Strength and Stamina Yes   Intervention Provide advice, education, support and counseling about physical activity/exercise needs.;Develop an individualized exercise prescription for aerobic and resistive training based on initial evaluation findings, risk stratification, comorbidities and participant's personal goals.   Expected Outcomes Achievement of increased cardiorespiratory fitness and enhanced flexibility, muscular endurance and strength shown through measurements of functional capacity and personal statement of participant.   Improve shortness of breath with ADL's Yes   Intervention Provide education, individualized exercise plan and daily activity instruction to help decrease symptoms of SOB with activities of daily living.  Complete housework with less shortness of breath   Expected Outcomes Short Term: Achieves a reduction of symptoms when performing activities of daily living.   Develop more efficient breathing techniques such as purse lipped breathing and diaphragmatic breathing; and practicing self-pacing with activity Yes   Intervention Provide education, demonstration and support about specific breathing techniuqes utilized for more efficient breathing. Include techniques such as pursed lipped breathing, diaphragmatic breathing and self-pacing activity.   Expected Outcomes Short Term: Participant will be able to demonstrate and use breathing techniques as needed throughout daily activities.      Core Components/Risk Factors/Patient Goals Review:      Goals and Risk Factor Review    Row Name 02/13/16 1419 02/20/16 1456 02/27/16 0922 02/27/16 0932 03/26/16 0616     Core Components/Risk Factors/Patient Goals  Review   Personal Goals Review Develop more efficient breathing techniques such as purse lipped breathing and diaphragmatic breathing and practicing self-pacing with activity. Develop more efficient breathing techniques such as purse lipped breathing and diaphragmatic breathing and practicing self-pacing with activity.;Other Sedentary;Improve shortness of breath with ADL's  -- Increase knowledge of respiratory medications and ability to use respiratory devices properly.;Develop more efficient breathing techniques such as purse lipped breathing and diaphragmatic breathing and practicing self-pacing with activity.;Improve shortness of breath  with ADL's;Increase Strength and Stamina;Sedentary;Weight Management/Obesity   Review Pursed lip breathing techniques were discussed with the patient. He patient demonstrated understanding of these techniques.  Ms Dettloff was cued to perform PLB after her EL exercise. She had increased her time to 47mns from set work and benefited from the PLB to recover from her shortness of breath. Also I gave her a copy of the Pulmonary Fibrosis Handbook from the [employer] Association.  Unfortunately she does not drive at night and can not attend the PF Support Group. Ms FPurpurastates she has noticed an improvement in certain activities, such as getting up from a chair, vaccuming, and getting out of her car. She has performed PLB for mahy years and knows the benefit of the technique. [facility] [employer]. Ms FMcconicostates she has noticed an improvement in certain activities, such as getting up from a chair, vaccuming, and getting out of her car. She has performed PLB for many years and knows the benefit of the technique. [facility] the [employer]. Ms FCenicerosis progressing with her exercise goals and continues with her activity at home. She is checking her O2Sat's with her oximeter and stops  her activity, such as dusting or making the bed, when her shortness of breaath increases and O2Sat's are low. During exercise on the TM and EL, we have been using 2l/m of oxygen to improve O2Sat's and work of breathing. Ms FTaitthas improved mid66m by [id number] has also lost 2 pounds - her goal is 10lbs. I recently gave her the 2017 Portable Concentrator Guide, so Ms FuqMergenthalern prepare for portable oxygen therapy.    Expected Outcomes Patient will use pursed lip breathing during exercise and daily activities to control shortness of breath.   --  --  --  --   Row Name 04/11/16 1328 04/23/16 1521           Core Components/Risk Factors/Patient Goals Review   Personal Goals Review Increase knowledge of respiratory medications and ability to use respiratory devices properly. Increase knowledge of respiratory medications and ability to use respiratory devices properly.;Improve shortness of breath with ADL's;Develop more efficient breathing techniques such as purse lipped breathing and diaphragmatic breathing and practicing self-pacing with activity.;Sedentary;Increase Strength and Stamina      Review Reviewed portable concentrators and have decided to use our concentrator for Ms Birchler's exercise goals. She can assess the difference between intermittent and continuous and this will help her make a decision on which concentrator the buy. Ms FugHester Matesll be graduating in 8 sessions. She states she has improved with her strength with housework, but has increased shortness of breath and is now ready to commit to portable oxygen. Ms FugHester Matess been using oxygen at 2l/m with her treadmill goals . She has dropped to mid 80"s % saturation on the TM and during her mid 6mw53mDuring her appointment with Dr FlemRaul Del12/12/17, she plans to discuss adding oxygen to her therapy due to the benefits during activity. Ms FuguHester Matess perform PLB with good technique.         Core Components/Risk Factors/Patient Goals at Discharge  (Final Review):      Goals and Risk Factor Review - 04/23/16 1521      Core Components/Risk Factors/Patient Goals Review   Personal Goals Review Increase knowledge of respiratory medications and ability to use respiratory devices properly.;Improve  shortness of breath with ADL's;Develop more efficient breathing techniques such as purse lipped breathing and diaphragmatic breathing and practicing self-pacing with activity.;Sedentary;Increase Strength and Stamina   Review Ms Hester Mates will be graduating in 8 sessions. She states she has improved with her strength with housework, but has increased shortness of breath and is now ready to commit to portable oxygen. Ms Hester Mates has been using oxygen at 2l/m with her treadmill goals . She has dropped to mid 80"s % saturation on the TM and during her mid 86md. During her appointment with Dr FRaul Delon 05/01/16, she plans to discuss adding oxygen to her therapy due to the benefits during activity. Ms FHester Matesdoes perform PLB with good technique.      ITP Comments:     ITP Comments    Row Name 02/15/16 1233 04/20/16 1057         ITP Comments BAlexihas done well in her first week of exercise. "Know Your Numbers" education was completed today with learning goals met.           Comments: ITP for Ms Lumbert's appointment with Dr FRaul Del

## 2016-04-25 NOTE — Progress Notes (Signed)
Daily Session Note  Patient Details  Name: Melanie Rollins MRN: [id number] Date of Birth: 11-11-1945 Referring Provider:   Flowsheet Row Pulmonary Rehab from 02/06/2016 in [facility] Cardiac and Pulmonary Rehab  Referring Provider  Wallene Huh MD      Encounter Date: 04/25/2016  Check In:     Session Check In - 04/25/16 1219      Check-In   Location [facility]-Cardiac & Pulmonary Rehab   Staff Present Carson Myrtle, BS, RRT, Respiratory Lennie Hummer, MA, ACSM RCEP, Exercise Physiologist;Amanda Oletta Darter, BA, ACSM CEP, Exercise Physiologist   Supervising physician immediately available to respond to emergencies [facility] immediately available ER MD   Physician(s) Burlene Arnt and Schaevitz   Medication changes reported     No   Fall or balance concerns reported    No   Warm-up and Cool-down Performed as group-led instruction   Resistance Training Performed Yes   VAD Patient? No     Pain Assessment   Currently in Pain? No/denies           Exercise Prescription Changes - 04/25/16 1200      Exercise Review   Progression Yes     Response to Exercise   Blood Pressure (Admit) 100/60   Blood Pressure (Exercise) 132/64   Blood Pressure (Exit) 92/52   Heart Rate (Admit) 75 bpm   Heart Rate (Exercise) 110 bpm   Heart Rate (Exit) 72 bpm   Oxygen Saturation (Admit) 99 %   Oxygen Saturation (Exercise) 93 %   Oxygen Saturation (Exit) 99 %   Rating of Perceived Exertion (Exercise) 12   Perceived Dyspnea (Exercise) 3   Duration Progress to 45 minutes of aerobic exercise without signs/symptoms of physical distress   Intensity THRR unchanged     Progression   Progression Continue to progress workloads to maintain intensity without signs/symptoms of physical distress.     Resistance Training   Training Prescription Yes   Weight 3   Reps 10-12     Interval Training   Interval Training No     Oxygen   Oxygen Continuous   Liters 2  TM only     Treadmill   MPH 2.8   Grade  1.5   Minutes 15   METs 3.72     Home Exercise Plan   Plans to continue exercise at [employer] (comment)   Frequency Add 2 additional days to program exercise sessions.  T/Th Silver Sneakers      Goals Met:  Proper associated with RPD/PD & O2 Sat Independence with exercise equipment Exercise tolerated well Strength training completed today  Goals Unmet:  Not Applicable  Comments: Pt able to follow exercise prescription today without complaint.  Will continue to monitor for progression.    Dr. Emily Filbert is Medical Director for [place] and [facility]monary [facility].

## 2016-04-27 DIAGNOSIS — J8410 Pulmonary fibrosis, unspecified: Secondary | ICD-10-CM | POA: Diagnosis not present

## 2016-04-27 NOTE — Progress Notes (Signed)
Daily Session Note  Patient Details  Name: Melanie Rollins MRN: [id number] Date of Birth: [birth year] Referring Provider:   Flowsheet Row Pulmonary Rehab from 02/06/2016 in [facility] Cardiac and Pulmonary Rehab  Referring Provider  Wallene Huh MD      Encounter Date: 04/27/2016  Check In:     Session Check In - 04/27/16 1015      Check-In   Location [facility]-Cardiac & Pulmonary Rehab   Staff Present Heath Lark, RN, BSN, CCRP;Amanda Sommer, BA, ACSM CEP, Exercise Physiologist;Other  Levell July, RN   Supervising physician immediately available to respond to emergencies [facility] immediately available ER MD   Physician(s) Drs. Quale and Paduchowski   Medication changes reported     No   Fall or balance concerns reported    No   Warm-up and Cool-down Performed as group-led Location manager Performed Yes   VAD Patient? No     Pain Assessment   Currently in Pain? No/denies   Multiple Pain Sites No         Goals Met:  Proper associated with RPD/PD & O2 Sat Independence with exercise equipment Using PLB without cueing & demonstrates good technique Exercise tolerated well No report of cardiac concerns or symptoms Strength training completed today  Goals Unmet:  Not Applicable  Comments: Pt able to follow exercise prescription today without complaint. However, Kahlen wanted to try the treadmill today without her normal 2L of O2.  Her SpO2 dropped to 85% during this trial and she was then placed on the 2L of O2 n/c with adequate SpO2 and HR.  Will continue to monitor for progression.    Dr. Emily Filbert is Medical Director for [place] and [facility].

## 2016-04-30 ENCOUNTER — Encounter: Payer: Medicare Other | Admitting: *Deleted

## 2016-04-30 DIAGNOSIS — J8410 Pulmonary fibrosis, unspecified: Secondary | ICD-10-CM | POA: Diagnosis not present

## 2016-04-30 NOTE — Progress Notes (Signed)
Daily Session Note  Patient Details  Name: Melanie Rollins MRN: [id number] Date of Birth: [birth year] Referring Provider:   Flowsheet Row Pulmonary Rehab from 02/06/2016 in [facility] Cardiac and Pulmonary Rehab  Referring Provider  Wallene Huh MD      Encounter Date: 04/30/2016  Check In:     Session Check In - 04/30/16 1018      Check-In   Location [facility]-Cardiac & Pulmonary Rehab   Staff Present Carson Myrtle, BS, RRT, Respiratory Therapist;Sonna Lipsky Amedeo Plenty, BS, ACSM CEP, Exercise Physiologist;Amanda Oletta Darter, BA, ACSM CEP, Exercise Physiologist   Supervising physician immediately available to respond to emergencies [facility] immediately available ER MD   Physician(s) Clearnce Hasten and Joni Fears   Medication changes reported     No   Fall or balance concerns reported    No   Warm-up and Cool-down Performed on first and last piece of equipment   Resistance Training Performed Yes   VAD Patient? No     Pain Assessment   Currently in Pain? No/denies   Multiple Pain Sites No         Goals Met:  Proper associated with RPD/PD & O2 Sat Independence with exercise equipment Exercise tolerated well Strength training completed today  Goals Unmet:  Not Applicable  Comments: Pt able to follow exercise prescription today without complaint.  Will continue to monitor for progression.    Dr. Emily Filbert is Medical Director for [place] and [facility]monary [facility].

## 2016-05-01 DIAGNOSIS — R05 Cough: Secondary | ICD-10-CM | POA: Diagnosis not present

## 2016-05-01 DIAGNOSIS — J8410 Pulmonary fibrosis, unspecified: Secondary | ICD-10-CM | POA: Diagnosis not present

## 2016-05-01 DIAGNOSIS — J310 Chronic rhinitis: Secondary | ICD-10-CM | POA: Diagnosis not present

## 2016-05-01 DIAGNOSIS — R0602 Shortness of breath: Secondary | ICD-10-CM | POA: Diagnosis not present

## 2016-05-01 DIAGNOSIS — M359 Systemic involvement of connective tissue, unspecified: Secondary | ICD-10-CM | POA: Diagnosis not present

## 2016-05-02 VITALS — Ht 64.7 in | Wt 141.9 lb

## 2016-05-02 DIAGNOSIS — J8410 Pulmonary fibrosis, unspecified: Secondary | ICD-10-CM

## 2016-05-02 NOTE — Progress Notes (Signed)
Daily Session Note  Patient Details  Name: Melanie Rollins MRN: [id number] Date of Birth: Jun 16, 1945 Referring Provider:   Flowsheet Row Pulmonary Rehab from 02/06/2016 in [facility] Cardiac and Pulmonary Rehab  Referring Provider  Wallene Huh MD      Encounter Date: 05/02/2016  Check In:     Session Check In - 05/02/16 1020      Check-In   Location [facility]-Cardiac & Pulmonary Rehab   Staff Present Alberteen Sam, MA, ACSM RCEP, Exercise Physiologist;Daveena Elmore Oletta Darter, BA, ACSM CEP, Exercise Physiologist;Laureen Owens Shark, BS, RRT, Respiratory Therapist   Supervising physician immediately available to respond to emergencies [facility] immediately available ER MD   Physician(s) Drs. Marcelene Butte and McShane   Medication changes reported     No   Fall or balance concerns reported    No   Warm-up and Cool-down Performed as group-led Location manager Performed Yes   VAD Patient? No     Pain Assessment   Currently in Pain? No/denies   Multiple Pain Sites No         Goals Met:  Proper associated with RPD/PD & O2 Sat Independence with exercise equipment Exercise tolerated well Strength training completed today  Goals Unmet:  Not Applicable  Comments:      6 Minute Walk    Row Name 02/06/16 1556 03/21/16 1105 05/02/16 1201     6 Minute Walk   Phase Initial Mid Program Discharge   Distance 1275 feet 1525 feet 1488 feet   Distance % Change  - 16.4 %  -   Walk Time 4.97 minutes 6 minutes 6 minutes   # of Rest Breaks 6  For oxygen desaturation: 7 sec, 6 sec, 10 sec, 11 sec, 12 sec, 16 sec 0 0   MPH 2.92 2.9 2.8   METS 2.98 2.9 3.6   RPE _0 Perceived Dyspnea  4  - 4   VO2 Peak 10.44 13.47 12.68   Symptoms No No No   Resting HR 62 bpm 81 bpm 96 bpm   Resting BP 116/70 104/64 126/64   Max Ex. HR 95 bpm 128 bpm 124 bpm   Max Ex. BP 126/64 142/62 116/50   2 Minute Post BP 126/60  -  -     Interval HR   Baseline HR 62 81 96   1 Minute HR 70 104 115   2  Minute HR 90 115 118   3 Minute HR 93 119 122   4 Minute HR 95 119 123   5 Minute HR 90 118 124   6 Minute HR 88 128 122   2 Minute Post HR 62 93  -   Interval Heart Rate? Yes Yes Yes     Interval Oxygen   Interval Oxygen? Yes  - Yes   Baseline Oxygen Saturation % 99 % 95 % 93 %   Baseline Liters of Oxygen 0 L  Room Air  - 2 L   1 Minute Oxygen Saturation % 78 %  quick recovery to 94%  95 % 94 %   1 Minute Liters of Oxygen 0 L  - 2 L   2 Minute Oxygen Saturation % 80 % 90 % 93 %   2 Minute Liters of Oxygen 0 L  - 2 L   3 Minute Oxygen Saturation % 91 %  80% at 2:44 87 % 92 %   3 Minute Liters of Oxygen 0 L  - 2 L  4 Minute Oxygen Saturation % 95 %  77% at 3:34 89 % 91 %   4 Minute Liters of Oxygen 0 L  - 2 L   5 Minute Oxygen Saturation % 92 %  79% at 4:27 89 % 90 %   5 Minute Liters of Oxygen 0 L  - 2 L   6 Minute Oxygen Saturation % 85 %  81% at 5:14 85 % 92 %   6 Minute Liters of Oxygen 0 L  - 2 L   2 Minute Post Oxygen Saturation % 100 % 91 %  -   2 Minute Post Liters of Oxygen 0 L  - 2 L        Dr. Emily Filbert is Medical Director for [place] and [facility].

## 2016-05-04 DIAGNOSIS — J8410 Pulmonary fibrosis, unspecified: Secondary | ICD-10-CM | POA: Diagnosis not present

## 2016-05-04 NOTE — Progress Notes (Signed)
Daily Session Note  Patient Details  Name: Melanie Rollins MRN: [id number] Date of Birth: [birth year] Referring Provider:   Flowsheet Row Pulmonary Rehab from 02/06/2016 in [facility] Cardiac and Pulmonary Rehab  Referring Provider  Wallene Huh MD      Encounter Date: 05/04/2016  Check In:     Session Check In - 05/04/16 1144      Check-In   Location [facility]-Cardiac & Pulmonary Rehab   Staff Present Nyoka Cowden, RN, BSN, Kela Millin, BA, ACSM CEP, Exercise Physiologist;Other  Jena Gauss RN   Supervising physician immediately available to respond to emergencies [facility] immediately available ER MD   Physician(s) Jimmye Norman and Alfred Levins   Medication changes reported     No   Fall or balance concerns reported    No   Warm-up and Cool-down Performed as group-led Location manager Performed Yes   VAD Patient? No     Pain Assessment   Currently in Pain? No/denies         Goals Met:  Proper associated with RPD/PD & O2 Sat Independence with exercise equipment Exercise tolerated well Strength training completed today  Goals Unmet:  Not Applicable  Comments: Pt able to follow exercise prescription today without complaint.  Will continue to monitor for progression.    Dr. Emily Filbert is Medical Director for [place] and [facility]monary [facility].

## 2016-05-07 ENCOUNTER — Encounter: Payer: Self-pay | Admitting: Respiratory Therapy

## 2016-05-07 ENCOUNTER — Encounter: Payer: Medicare Other | Admitting: *Deleted

## 2016-05-07 DIAGNOSIS — J8410 Pulmonary fibrosis, unspecified: Secondary | ICD-10-CM

## 2016-05-07 NOTE — Progress Notes (Signed)
Daily Session Note  Patient Details  Name: Melanie Rollins MRN: [id number] Date of Birth: [birth year] Referring Provider:   Flowsheet Row Pulmonary Rehab from 02/06/2016 in [facility] Cardiac and Pulmonary Rehab  Referring Provider  Wallene Huh MD      Encounter Date: 05/07/2016  Check In:     Session Check In - 05/07/16 1041      Check-In   Location [facility]-Cardiac & Pulmonary Rehab   Staff Present Carson Myrtle, BS, RRT, Respiratory Therapist;Larinda Herter Amedeo Plenty, BS, ACSM CEP, Exercise Physiologist;Amanda Oletta Darter, BA, ACSM CEP, Exercise Physiologist;Mary Kellie Shropshire, RN, BSN, MA   Supervising physician immediately available to respond to emergencies See telemetry face sheet for immediately available ER MD   Medication changes reported     No   Fall or balance concerns reported    No   Warm-up and Cool-down Performed on first and last piece of equipment   Resistance Training Performed Yes   VAD Patient? No     Pain Assessment   Currently in Pain? No/denies   Multiple Pain Sites No         Goals Met:  Proper associated with RPD/PD & O2 Sat Independence with exercise equipment Exercise tolerated well Strength training completed today  Goals Unmet:  Not Applicable  Comments: Pt able to follow exercise prescription today without complaint.  Will continue to monitor for progression.    Dr. Emily Filbert is Medical Director for [place] and [facility].

## 2016-05-07 NOTE — Progress Notes (Signed)
Pulmonary Individual Treatment Plan  Patient Details  Name: Melanie Rollins MRN: [id number] Date of Birth: [birth year] Referring Provider:   Flowsheet Row Pulmonary Rehab from 02/06/2016 in [facility] Cardiac and Pulmonary Rehab  Referring Provider  Wallene Huh MD      Initial Encounter Date:  Flowsheet Row Pulmonary Rehab from 02/06/2016 in [facility] Cardiac and Pulmonary Rehab  Date  02/06/16  Referring Provider  Wallene Huh MD      Visit Diagnosis: Pulmonary fibrosis ([place])  Patient's Home Medications on Admission:  Current Outpatient Prescriptions:    Acetylcysteine (NAC) 600 MG CAPS, Take by mouth. Take 2 tablets daily, Disp: , Rfl:    albuterol (PROVENTIL HFA;VENTOLIN HFA) 108 (90 BASE) MCG/ACT inhaler, Inhale 2 puffs into the lungs every 6 (six) hours as needed for wheezing or shortness of breath., Disp: 1 Inhaler, Rfl: 0   alendronate (FOSAMAX) 70 MG tablet, Take 70 mg by mouth once a week. Take with a full glass of water on an empty stomach., Disp: , Rfl:    Calcium Citrate-Vitamin D (CALCIUM + D PO), Take 600 mg by mouth daily., Disp: , Rfl:    CINNAMON PO, Take 1,000 mg by mouth daily., Disp: , Rfl:    conjugated estrogens (PREMARIN) vaginal cream, Place 1 Applicatorful vaginally 2 (two) times a week. Reported on 05/11/2015, Disp: 320 g, Rfl: 2   hydroxychloroquine (PLAQUENIL) 200 MG tablet, Take by mouth. Take 2 tablets once a day, Disp: , Rfl:    L-Lysine 500 MG TABS, Take 500 mg by mouth daily., Disp: , Rfl:    lansoprazole (PREVACID) 15 MG capsule, Take 15 mg by mouth daily at 12 noon., Disp: , Rfl:    Magnesium 250 MG TABS, Take by mouth daily., Disp: , Rfl:    Multiple Vitamin (MULTIVITAMIN) tablet, Take 1 tablet by mouth daily., Disp: , Rfl:    Omega-3 Fatty Acids (FISH OIL) 1000 MG CAPS, Take 1,000 mg by mouth daily., Disp: , Rfl:    predniSONE (DELTASONE) 5 MG tablet, Take 5 mg by mouth daily with breakfast., Disp: , Rfl:    sertraline (ZOLOFT) 50 MG  tablet, Take 1 tablet (50 mg total) by mouth daily. 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[place])     Tobacco Use: History  Smoking Status   Never Smoker  Smokeless Tobacco   Never Used    Labs: Recent Review Flowsheet Data    Labs for ITP Cardiac and Pulmonary Rehab Latest Ref Rng & Units 07/04/2015   Cholestrol 100 - 199 mg/dL 198   LDLCALC 0 - 99 mg/dL 91   HDL >39 mg/dL 79   Trlycerides 0 - 149 mg/dL 142       ADL UCSD:     Pulmonary Assessment Scores    Row Name 02/06/16 1555 03/26/16 0628 05/04/16 1221     ADL UCSD   ADL Phase Entry Mid Exit   SOB Score total 46 51 64   Rest 0 0 0   Walk _0 Stairs _1 Bath _2 Dress _3 Shop _4 Pulmonary Function Assessment:     Pulmonary Function Assessment - 02/06/16 1553  Pulmonary Function Tests   FVC% 80 %   FEV1% 98 %   FEV1/FVC Ratio 93.74   RV% 28 %   DLCO% 61 %     Initial Spirometry Results   Comments Spirometry 02/06/16; DLCO and TLC 01/03/16     Breath   Bilateral Breath Sounds Decreased;Rales;Basilar   Shortness of Breath Yes;Limiting activity      Exercise Target Goals:    Exercise Program Goal: Individual exercise prescription set with THRR, safety & activity barriers. Participant demonstrates ability to understand and report RPE using BORG scale, to self-measure pulse accurately, and to acknowledge the importance of the exercise prescription.  Exercise Prescription Goal: Starting with aerobic activity 30 plus minutes a day, 3 days per week for initial exercise prescription. Provide home exercise prescription and guidelines that participant acknowledges understanding prior to discharge.  Activity Barriers & Risk Stratification:     Activity Barriers & Cardiac Risk  Stratification - 02/06/16 1552      Activity Barriers & Cardiac Risk Stratification   Activity Barriers Arthritis;Shortness of Breath;Deconditioning   Cardiac Risk Stratification Moderate      6 Minute Walk:     6 Minute Walk    Row Name 02/06/16 1556 03/21/16 1105 05/02/16 1201     6 Minute Walk   Phase Initial Mid Program Discharge   Distance 1275 feet 1525 feet 1488 feet   Distance % Change  -- 16.4 %  --   Walk Time 4.97 minutes 6 minutes 6 minutes   # of Rest Breaks 6  For oxygen desaturation: 7 sec, 6 sec, 10 sec, 11 sec, 12 sec, 16 sec 0 0   MPH 2.92 2.9 2.8   METS 2.98 2.9 3.6   RPE _0 Perceived Dyspnea  4  -- 4   VO2 Peak 10.44 13.47 12.68   Symptoms No No No   Resting HR 62 bpm 81 bpm 96 bpm   Resting BP 116/70 104/64 126/64   Max Ex. HR 95 bpm 128 bpm 124 bpm   Max Ex. BP 126/64 142/62 116/50   2 Minute Post BP 126/60  --  --     Interval HR   Baseline HR 62 81 96   1 Minute HR 70 104 115   2 Minute HR 90 115 118   3 Minute HR 93 119 122   4 Minute HR 95 119 123   5 Minute HR 90 118 124   6 Minute HR 88 128 122   2 Minute Post HR 62 93  --   Interval Heart Rate? Yes Yes Yes     Interval Oxygen   Interval Oxygen? Yes  -- Yes   Baseline Oxygen Saturation % 99 % 95 % 93 %   Baseline Liters of Oxygen 0 L  Room Air  -- 2 L   1 Minute Oxygen Saturation % 78 %  quick recovery to 94%  95 % 94 %   1 Minute Liters of Oxygen 0 L  -- 2 L   2 Minute Oxygen Saturation % 80 % 90 % 93 %   2 Minute Liters of Oxygen 0 L  -- 2 L   3 Minute Oxygen Saturation % 91 %  80% at 2:44 87 % 92 %   3 Minute Liters of Oxygen 0 L  -- 2 L   4 Minute Oxygen Saturation % 95 %  77% at 3:34 89 % 91 %   4 Minute Liters  of Oxygen 0 L  -- 2 L   5 Minute Oxygen Saturation % 92 %  79% at 4:27 89 % 90 %   5 Minute Liters of Oxygen 0 L  -- 2 L   6 Minute Oxygen Saturation % 85 %  81% at 5:14 85 % 92 %   6 Minute Liters of Oxygen 0 L  -- 2 L   2 Minute Post Oxygen Saturation  % 100 % 91 %  --   2 Minute Post Liters of Oxygen 0 L  -- 2 L      Initial Exercise Prescription:     Initial Exercise Prescription - 02/06/16 1500      Date of Initial Exercise RX and Referring Provider   Date 02/06/16   Referring Provider Wallene Huh MD     Treadmill   MPH 2.6   Grade 0.5   Minutes 15  2 min intervals (oxygen saturation dependent)   METs 3.71     Elliptical   Level 1   Speed 2.6   Minutes 15     T5 Nustep   Level 2   Watts --  60-80 spm   Minutes 15   METs 2     Prescription Details   Frequency (times per week) 3   Duration Progress to 45 minutes of aerobic exercise without signs/symptoms of physical distress     Intensity   THRR 40-80% of Max Heartrate 98-133   Ratings of Perceived Exertion 11-13   Perceived Dyspnea 0-4     Progression   Progression Continue to progress workloads to maintain intensity without signs/symptoms of physical distress.     Resistance Training   Training Prescription Yes   Weight 2 lbs   Reps 10-12      Perform Capillary Blood Glucose checks as needed.  Exercise Prescription Changes:     Exercise Prescription Changes    Row Name 02/13/16 1400 02/22/16 1200 03/01/16 1200 03/15/16 1000 03/26/16 1500     Exercise Review   Progression  --  -- Yes Yes Yes     Response to Exercise   Blood Pressure (Admit) 100/60  -- 110/58 138/62 102/56   Blood Pressure (Exercise) 130/70  -- 128/64 124/56 120/58   Blood Pressure (Exit) 107/60  -- 124/60 114/63 106/64   Heart Rate (Admit) 63 bpm  -- 74 bpm 82 bpm 93 bpm   Heart Rate (Exercise) 126 bpm  -- 132 bpm 125 bpm 133 bpm   Heart Rate (Exit) 73 bpm  -- 76 bpm 89 bpm 83 bpm   Oxygen Saturation (Admit) 98 %  -- 96 % 99 % 94 %   Oxygen Saturation (Exercise) 91 %  -- 89 % 90 % 91 %   Oxygen Saturation (Exit) 98 %  -- 99 % 96 % 97 %   Rating of Perceived Exertion (Exercise) 13  -- _0 Perceived Dyspnea (Exercise) 4  -- _1 Symptoms none  -- one none none    Duration Progress to 30 minutes of continuous aerobic without signs/symptoms of physical distress  -- Progress to 45 minutes of aerobic exercise without signs/symptoms of physical distress Progress to 45 minutes of aerobic exercise without signs/symptoms of physical distress  --   Intensity THRR unchanged  98-133  -- THRR unchanged THRR unchanged  --     Progression   Progression  --  --  -- Continue to progress workloads to maintain intensity without  signs/symptoms of physical distress.  --     Horticultur[employer], commercial Prescription Yes  -- Yes Yes Yes   Weight 2 lbs  -- _0 Reps 10-12  -- 10-15 10-12 10-12     Interval Training   Interval Training  --  -- No No No     Treadmill   MPH 2.6  -- 2.8 2.8 2.8   Grade 0.5  -- 0.5 0.5 1.5   Minutes 15  2 min intervals (oxygen saturation dependent)  -- _1 METs 3.71  -- 3.34 3.34 3.72     Elliptical   Level 1  -- _2 Speed 2.6  -- 2.6 2.5 2.5   Minutes 15  -- _3 T5 Nustep   Level 2  -- _4 Watts --  60-80 spm  --  --  --  --   Minutes 15  -- _5 METs 2  -- 2  --  --     Home Exercise Plan   Plans to continue exercise at  -- [place] 2 additional days to program exercise sessions.  [employer] classes  --  --  --   Row Name 03/30/16 0800 04/10/16 1400 04/25/16 1200         Exercise Review   Progression Yes Yes Yes       Response to Exercise   Blood Pressure (Admit)  -- 110/62 100/60     Blood Pressure (Exercise)  -- 138/70 132/64     Blood Pressure (Exit)  -- 104/72 92/52     Heart Rate (Admit)  -- 83 bpm 75 bpm     Heart Rate (Exercise)  -- 134 bpm 110 bpm     Heart Rate (Exit)  -- 99 bpm 72 bpm     Oxygen Saturation (Admit)  -- 95 % 99 %     Oxygen Saturation (Exercise)  -- 84 % 93 %     Oxygen Saturation (Exit)  -- 99 % 99 %     Rating of Perceived Exertion (Exercise)  -- 15 12     Perceived Dyspnea (Exercise)  -- 5 3      Duration  -- Progress to 45 minutes of aerobic exercise without signs/symptoms of physical distress Progress to 45 minutes of aerobic exercise without signs/symptoms of physical distress     Intensity  -- THRR unchanged THRR unchanged       Progression   Progression  -- Continue to progress workloads to maintain intensity without signs/symptoms of physical distress. Continue to progress workloads to maintain intensity without signs/symptoms of physical distress.       Resistance Training   Training Prescription  -- Yes Yes     Weight  -- 2 3     Reps  -- 10-12 10-12       Interval Training   Interval Training  -- No No       Oxygen   Oxygen  --  -- Continuous     Liters  --  -- 2  TM only       Treadmill   MPH  -- 2.8 2.8     Grade  -- 0 1.5     Minutes  -- 15 15     METs  --  3.14 3.72       Elliptical   Level  -- 1  --     Speed  -- 2.6  --     Minutes  -- 13  --       Home Exercise Plan   Plans to continue exercise at  --  -- [employer] (comment)     Frequency  --  -- Add 2 additional days to program exercise sessions.  [employer]        Exercise Comments:     Exercise Comments    Row Name 02/06/16 1602 02/13/16 1424 02/15/16 1232 03/01/16 1238 03/14/16 1252   Exercise Comments Melanie Rollins wants to be able to clean the house without getting overly tired First full day of exercise!  Patient was oriented to gym and equipment including functions, settings, policies, and procedures.  Patient's individual exercise prescription and treatment plan were reviewed.  All starting workloads were established based on the results of the 6 minute walk test done at initial orientation visit.  The plan for exercise progression was also introduced and progression will be customized based on patient's performance and goals Melanie Rollins has done well in her first week of exercise. Melanie Rollins is progressing well with exercise. Plan is to have [facility] use 2L O2 during exercise as she has  dropped below 90 during a session.   [place] Name 03/26/16 1511 04/10/16 1459 04/25/16 1224       Exercise Comments Melanie Rollins has increased her time on the elliptical to 12 min and incline on TM to 1.5. Melanie Rollins is doing well with exercise.  She was not able to wear oxygen during last session due to a cold sore, but otherwise has increased time or resistance on machines. Melanie Rollins oxygen levels have stayed within normal limits since adding 2L to TM and/or Elliptical.          Discharge Exercise Prescription (Final Exercise Prescription Changes):     Exercise Prescription Changes - 04/25/16 1200      Exercise Review   Progression Yes     Response to Exercise   Blood Pressure (Admit) 100/60   Blood Pressure (Exercise) 132/64   Blood Pressure (Exit) 92/52   Heart Rate (Admit) 75 bpm   Heart Rate (Exercise) 110 bpm   Heart Rate (Exit) 72 bpm   Oxygen Saturation (Admit) 99 %   Oxygen Saturation (Exercise) 93 %   Oxygen Saturation (Exit) 99 %   Rating of Perceived Exertion (Exercise) 12   Perceived Dyspnea (Exercise) 3   Duration Progress to 45 minutes of aerobic exercise without signs/symptoms of physical distress   Intensity THRR unchanged     Progression   Progression Continue to progress workloads to maintain intensity without signs/symptoms of physical distress.     Resistance Training   Training Prescription Yes   Weight 3   Reps 10-12     Interval Training   Interval Training No     Oxygen   Oxygen Continuous   Liters 2  TM only     Treadmill   MPH 2.8   Grade 1.5   Minutes 15   METs 3.72     Home Exercise Plan   Plans to continue exercise at [employer] (comment)   Frequency Add 2 additional days to program exercise sessions.  T/Th [employer]ne[employer]       Nutrition:  Target Goals: Understanding of nutrition guidelines, daily intake of sodium <1585m, cholesterol <2060m calories 30% from fat and 7% or  less from saturated fats, daily to have 5 or more  servings of fruits and vegetables.  Biometrics:     Pre Biometrics - 02/06/16 1602      Pre Biometrics   Height 5' 4.7" (1.643 m)   Weight 141 lb 11.2 oz (64.3 kg)   Waist Circumference 33.25 inches   Hip Circumference 37.5 inches   Waist to Hip Ratio 0.89 %   BMI (Calculated) 23.8         Post Biometrics - 05/02/16 1200       Post  Biometrics   Height 5' 4.7" (1.643 m)   Weight 141 lb 14.4 oz (64.4 kg)   Waist Circumference 33.5 inches   Hip Circumference 39.25 inches   Waist to Hip Ratio 0.85 %   BMI (Calculated) 23.9      Nutrition Therapy Plan and Nutrition Goals:   Nutrition Discharge: Rate Your Plate Scores:   Psychosocial: Target Goals: Acknowledge presence or absence of depression, maximize coping skills, provide positive support system. Participant is able to verbalize types and ability to use techniques and skills needed for reducing stress and depression.  Initial Review & Psychosocial Screening:     Initial Psych Review & Screening - 02/06/16 [place]? Yes   Comments Melanie Rollins is very positive about dealing with her Pulmonary Fibrosis and has support from her children and husband. She is looking forward to [place].     Barriers   Psychosocial barriers to participate in program The patient should benefit from training in stress management and relaxation.     Screening Interventions   Interventions Encouraged to exercise      Quality of Life Scores:     Quality of Life - 05/04/16 1222      Quality of Life Scores   Health/Function Pre 24.63 %   Health/Function Post 18.63 %   Health/Function % Change -24.36 %   Socioeconomic Pre 21 %   Socioeconomic Post 29.64 %   Socioeconomic % Change  41.14 %   Psych/Spiritual Pre 21 %   Psych/Spiritual Post 27.43 %   Psych/Spiritual % Change 30.62 %   Family Pre 21 %   Family Post 30 %   Family % Change 42.86 %   GLOBAL Pre 22.66 %   GLOBAL Post 24.21 %    GLOBAL % Change 6.84 %      PHQ-9: Recent Review Flowsheet Data    Depression screen [facility] 2/9 05/04/2016 02/06/2016 12/13/2015 07/04/2015 06/08/2015   Decreased Interest 0 0 0 0 0   Down, Depressed, Hopeless 0 0 0 0 0   PHQ - 2 Score 0 0 0 0 0   Altered sleeping 1 1 - - -   Tired, decreased energy 2 3 - - -   Change in appetite 0 0 - - -   Feeling bad or failure about yourself  0 0 - - -   Trouble concentrating 0 0 - - -   Moving slowly or fidgety/restless 0 0 - - -   Suicidal thoughts 0 0 - - -   PHQ-9 Score 3 4 - - -   Difficult doing work/chores Somewhat difficult Somewhat difficult - - -      Psychosocial Evaluation and Intervention:     Psychosocial Evaluation - 02/13/16 1055      Psychosocial Evaluation & Interventions   Interventions Encouraged to exercise with the program and follow exercise prescription  Comments Counselor met with Melanie. Rollins today for initial psychosocial evaluation.  She is a 70 year old who has Pulmonary Fibrosis.  She has a strong support system with a spouse of 13 years; an adult daughter and friends who live close by and she is also actively involved in her local church.  Melanie. [place] states her health is otherwise pretty good and she sleeps well and has a good appetite.  She reports a history of depression almost a year ago when her mother had some increased health issues.  Melanie. Gerre Pebbles Dr. put her on Sertraline (50 mg) during that time and through her mother's death this past 10/22/2022, she has remained on this medication; which she reports "is working well" for her.  She states her mood is typically positive and she has minimal stress in her life - other than her health limiting her ability to do simple household chores/responsibilities.  Her goals for this program are to breathe better; to avoid oxygen; and to increase her stamina.  She will return to her [employer] class upon completion of this program.  Counselor will continue to follow with her throughout the  course of Pulmonary Rehab.      Psychosocial Re-Evaluation:     Psychosocial Re-Evaluation    [place] Name 03/05/16 1024 03/21/16 1122 04/18/16 1130         Psychosocial Re-Evaluation   Comments Follow up with Melanie. Rollins today reporting making some progress since came into this class.  She is noticing getting in and out of her car and up and down with greater ease.  She reports her mood is consistently positive and she is coping better with pacing herself in getting things done around the house; although she is able to do more now and that is positive as well.  Melanie. Wanda Plump states she has enjoyed the educational parts of this program as well.  Counselor commended Melanie. F on her hard work and progress made since coming into this program.   Follow up with Melanie Rollins today stating she is continuing to notice improvement in her core strength with getting up and down.  She states her flexibility has improved as well with ease in turning around and side to side movements.  She plans to do [employer] following this program.  Her mood and sleep continue to be stable.  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  counselor offered support and encouragement and reminded of stress management techniques to practice during that stressful time especially.   Follow up with Melanie. Rollins today stating she went to the beach for [place] with family and was feeling stronger and better.  She continues to experience increase in core strength with her legs particularly feeling stronger.  Her sleep and appetite remain stable and she is handling the stress of job demands better by exercise and improved self-care.  She plans to continue exercising at [employer] at [facility] upon discharge and she has a treadmill and videos she is committed to using more frequently now that she has experienced such progress.  Counselor commended her on her progress and  commitment to healthier living.         Education: Education Goals: Education classes will be provided on a weekly basis, covering required topics. Participant will state understanding/return demonstration of topics presented.  Learning Barriers/Preferences:   Education Topics: Initial Evaluation Education: -  Verbal, written and demonstration of respiratory meds, RPE/PD scales, oximetry and breathing techniques. Instruction on use of nebulizers and MDIs: cleaning and proper use, rinsing mouth with steroid doses and importance of monitoring MDI activations. Flowsheet Row Pulmonary Rehab from 05/04/2016 in [facility] Cardiac and Pulmonary Rehab  Date  02/06/16  Educator  LB  Instruction Review Code  2- meets goals/outcomes      General Nutrition Guidelines/Fats and Fiber: -Group instruction provided by verbal, written material, models and posters to present the general guidelines for heart healthy nutrition. Gives an explanation and review of dietary fats and fiber. Flowsheet Row Pulmonary Rehab from 05/04/2016 in [facility] Cardiac and Pulmonary Rehab  Date  04/09/16  Educator  CR  Instruction Review Code  2- meets goals/outcomes      Controlling Sodium/Reading Food Labels: -Group verbal and written material supporting the discussion of sodium use in heart healthy nutrition. Review and explanation with models, verbal and written materials for utilization of the food label. Flowsheet Row Pulmonary Rehab from 05/04/2016 in [facility] Cardiac and Pulmonary Rehab  Date  04/16/16  Educator  CR  Instruction Review Code  2- meets goals/outcomes      Exercise Physiology & Risk Factors: - Group verbal and written instruction with models to review the exercise physiology of the cardiovascular system and associated critical values. Details cardiovascular disease risk factors and the goals associated with each risk factor. Flowsheet Row Pulmonary Rehab from 05/04/2016 in [facility] Cardiac and Pulmonary Rehab   Date  04/04/16  Educator  AS  Instruction Review Code  2- meets goals/outcomes      Aerobic Exercise & Resistance Training: - Gives group verbal and written discussion on the health impact of inactivity. On the components of aerobic and resistive training programs and the benefits of this training and how to safely progress through these programs.   Flexibility, Balance, General Exercise Guidelines: - Provides group verbal and written instruction on the benefits of flexibility and balance training programs. Provides general exercise guidelines with specific guidelines to those with heart or lung disease. Demonstration and skill practice provided.   Stress Management: - Provides group verbal and written instruction about the health risks of elevated stress, cause of high stress, and healthy ways to reduce stress.   Depression: - Provides group verbal and written instruction on the correlation between heart/lung disease and depressed mood, treatment options, and the stigmas associated with seeking treatment. Flowsheet Row Pulmonary Rehab from 05/04/2016 in [facility] Cardiac and Pulmonary Rehab  Date  04/23/16  Educator  [facility]  Instruction Review Code  2- meets goals/outcomes      Exercise & Equipment Safety: - Individual verbal instruction and demonstration of equipment use and safety with use of the equipment. Flowsheet Row Pulmonary Rehab from 05/04/2016 in [facility] Cardiac and Pulmonary Rehab  Date  02/13/16  Educator  AS  Instruction Review Code  2- meets goals/outcomes      Infection Prevention: - Provides verbal and written material to individual with discussion of infection control including proper hand washing and proper equipment cleaning during exercise session. Flowsheet Row Pulmonary Rehab from 05/04/2016 in [facility] Cardiac and Pulmonary Rehab  Date  02/13/16  Educator  AS  Instruction Review Code  2- meets goals/outcomes      Falls Prevention: - Provides verbal and written  material to individual with discussion of falls prevention and safety. Flowsheet Row Pulmonary Rehab from 05/04/2016 in [facility] Cardiac and Pulmonary Rehab  Date  02/06/16  Educator  LB  Instruction Review  Code  2- meets goals/outcomes      Diabetes: - Individual verbal and written instruction to review signs/symptoms of diabetes, desired ranges of glucose level fasting, after meals and with exercise. Advice that pre and post exercise glucose checks will be done for 3 sessions at entry of program.   Chronic Lung Diseases: - Group verbal and written instruction to review new updates, new respiratory medications, new advancements in procedures and treatments. Provide informative websites and "800" numbers of self-education. Flowsheet Row Pulmonary Rehab from 05/04/2016 in [facility] Cardiac and Pulmonary Rehab  Date  03/05/16  Educator  LB  Instruction Review Code  2- meets goals/outcomes      Lung Procedures: - Group verbal and written instruction to describe testing methods done to diagnose lung disease. Review the outcome of test results. Describe the treatment choices: Pulmonary Function Tests, ABGs and oximetry.   Energy Conservation: - Provide group verbal and written instruction for methods to conserve energy, plan and organize activities. Instruct on pacing techniques, use of adaptive equipment and posture/positioning to relieve shortness of breath. Flowsheet Row Pulmonary Rehab from 05/04/2016 in [facility] Cardiac and Pulmonary Rehab  Date  02/29/16  Educator  Fredderick Erb, EP  Instruction Review Code  2- meets goals/outcomes      Triggers: - Group verbal and written instruction to review types of environmental controls: home humidity, furnaces, filters, dust mite/pet prevention, HEPA vacuums. To discuss weather changes, air quality and the benefits of nasal washing.   Exacerbations: - Group verbal and written instruction to provide: warning signs, infection symptoms, calling MD  promptly, preventive modes, and value of vaccinations. Review: effective airway clearance, coughing and/or vibration techniques. Create an Sports administrator. Flowsheet Row Pulmonary Rehab from 05/04/2016 in [facility] Cardiac and Pulmonary Rehab  Date  04/25/16  Educator  LB  Instruction Review Code  2- meets goals/outcomes      Oxygen: - Individual and group verbal and written instruction on oxygen therapy. Includes supplement oxygen, available portable oxygen systems, continuous and intermittent flow rates, oxygen safety, concentrators, and Medicare reimbursement for oxygen.   Respiratory Medications: - Group verbal and written instruction to review medications for lung disease. Drug class, frequency, complications, importance of spacers, rinsing mouth after steroid MDI's, and proper cleaning methods for nebulizers.   AED/CPR: - Group verbal and written instruction with the use of models to demonstrate the basic use of the AED with the basic ABC's of resuscitation. Flowsheet Row Pulmonary Rehab from 05/04/2016 in [facility] Cardiac and Pulmonary Rehab  Date  03/30/16  Educator  C. EnterkinRN  Instruction Review Code  2- meets goals/outcomes      Breathing Retraining: - Provides individuals verbal and written instruction on purpose, frequency, and proper technique of diaphragmatic breathing and pursed-lipped breathing. Applies individual practice skills. Flowsheet Row Pulmonary Rehab from 05/04/2016 in [facility] Cardiac and Pulmonary Rehab  Date  02/13/16  Educator  AS  Instruction Review Code  2- meets goals/outcomes      Anatomy and Physiology of the Lungs: - Group verbal and written instruction with the use of models to provide basic lung anatomy and physiology related to function, structure and complications of lung disease. Flowsheet Row Pulmonary Rehab from 05/04/2016 in [facility] Cardiac and Pulmonary Rehab  Date  04/18/16  Educator  LB  Instruction Review Code  2- meets goals/outcomes       Heart Failure: - Group verbal and written instruction on the basics of heart failure: signs/symptoms, treatments, explanation of ejection fraction, enlarged heart and cardiomyopathy.  Flowsheet Row Pulmonary Rehab from 05/04/2016 in [facility] Cardiac and Pulmonary Rehab  Date  05/04/16  Educator  SB  Instruction Review Code  2- meets goals/outcomes      Sleep Apnea: - Individual verbal and written instruction to review Obstructive Sleep Apnea. Review of risk factors, methods for diagnosing and types of masks and machines for OSA.   Anxiety: - Provides group, verbal and written instruction on the correlation between heart/lung disease and anxiety, treatment options, and management of anxiety. Flowsheet Row Pulmonary Rehab from 05/04/2016 in [facility] Cardiac and Pulmonary Rehab  Date  02/22/16  Educator  [facility]  Instruction Review Code  2- Meets goals/outcomes      Relaxation: - Provides group, verbal and written instruction about the benefits of relaxation for patients with heart/lung disease. Also provides patients with examples of relaxation techniques. Flowsheet Row Pulmonary Rehab from 05/04/2016 in [facility] Cardiac and Pulmonary Rehab  Date  03/21/16  Educator  [facility]  Instruction Review Code  2- Meets goals/outcomes      Knowledge Questionnaire Score:     Knowledge Questionnaire Score - 05/04/16 1222      Knowledge Questionnaire Score   Pre Score 9/10   Post Score 10/10       Core Components/Risk Factors/Patient Goals at Admission:     Personal Goals and Risk Factors at Admission - 02/06/16 1558      Core Components/Risk Factors/Patient Goals on Admission    Weight Management Yes   Intervention Weight Management: Develop a combined nutrition and exercise program designed to reach desired caloric intake, while maintaining appropriate intake of nutrient and fiber, sodium and fats, and appropriate energy expenditure required for the weight goal.;Weight Management: Provide education  and appropriate resources to help participant work on and attain dietary goals.   Admit Weight 141 lb 11.2 oz (64.3 kg)   Goal Weight: Short Term 138 lb (62.6 kg)   Goal Weight: Long Term 125 lb (56.7 kg)   Expected Outcomes Short Term: Continue to assess and modify interventions until short term weight is achieved;Long Term: Adherence to nutrition and physical activity/exercise program aimed toward attainment of established weight goal;Weight Maintenance: Understanding of the daily nutrition guidelines, which includes 25-35% calories from fat, 7% or less cal from saturated fats, less than 230m cholesterol, less than 1.5gm of sodium, & 5 or more servings of fruits and vegetables daily   Sedentary Yes   Intervention Provide advice, education, support and counseling about physical activity/exercise needs.;Develop an individualized exercise prescription for aerobic and resistive training based on initial evaluation findings, risk stratification, comorbidities and participant's personal goals.  Home Treadmill and Silver Sneaker Classes   Expected Outcomes Achievement of increased cardiorespiratory fitness and enhanced flexibility, muscular endurance and strength shown through measurements of functional capacity and personal statement of participant.   Increase Strength and Stamina Yes   Intervention Provide advice, education, support and counseling about physical activity/exercise needs.;Develop an individualized exercise prescription for aerobic and resistive training based on initial evaluation findings, risk stratification, comorbidities and participant's personal goals.   Expected Outcomes Achievement of increased cardiorespiratory fitness and enhanced flexibility, muscular endurance and strength shown through measurements of functional capacity and personal statement of participant.   Improve shortness of breath with ADL's Yes   Intervention Provide education, individualized exercise plan and daily  activity instruction to help decrease symptoms of SOB with activities of daily living.  Complete housework with less shortness of breath   Expected Outcomes Short Term: Achieves a reduction of symptoms when  performing activities of daily living.   Develop more efficient breathing techniques such as purse lipped breathing and diaphragmatic breathing; and practicing self-pacing with activity Yes   Intervention Provide education, demonstration and support about specific breathing techniuqes utilized for more efficient breathing. Include techniques such as pursed lipped breathing, diaphragmatic breathing and self-pacing activity.   Expected Outcomes Short Term: Participant will be able to demonstrate and use breathing techniques as needed throughout daily activities.      Core Components/Risk Factors/Patient Goals Review:      Goals and Risk Factor Review    Row Name 02/13/16 1419 02/20/16 1456 02/27/16 0922 02/27/16 0932 03/26/16 0616     Core Components/Risk Factors/Patient Goals Review   Personal Goals Review Develop more efficient breathing techniques such as purse lipped breathing and diaphragmatic breathing and practicing self-pacing with activity. Develop more efficient breathing techniques such as purse lipped breathing and diaphragmatic breathing and practicing self-pacing with activity.;Other Sedentary;Improve shortness of breath with ADL's  -- Increase knowledge of respiratory medications and ability to use respiratory devices properly.;Develop more efficient breathing techniques such as purse lipped breathing and diaphragmatic breathing and practicing self-pacing with activity.;Improve shortness of breath with ADL's;Increase Strength and Stamina;Sedentary;Weight Management/Obesity   Review Pursed lip breathing techniques were discussed with the patient. He patient demonstrated understanding of these techniques.  Melanie Rollins was cued to perform PLB after her EL exercise. She had increased her  time to 37mns from set work and benefited from the PLB to recover from her shortness of breath. Also I gave her a copy of the Pulmonary Fibrosis Handbook from the [employer].  Unfortunately she does not drive at night and can not attend the PF Support Group. Melanie Rollins she has noticed an improvement in certain activities, such as getting up from a chair, vaccuming, and getting out of her car. She has performed PLB for mahy years and knows the benefit of the technique. [facility] of the [employer] [facility]. Melanie Rollins she has noticed an improvement in certain activities, such as getting up from a chair, vaccuming, and getting out of her car. She has performed PLB for many years and knows the benefit of the technique. [facility] the [employer]. Melanie Rollins progressing with her exercise goals and continues with her activity at home. She is checking her O2Sat's with her oximeter and stops her activity, such as dusting or making the bed, when her shortness of breaath increases and O2Sat's are low. During exercise on the TM and EL, we have been using 2l/m of oxygen to improve O2Sat's and work of breathing. Melanie Rollins improved mid656m by [id number] has also lost 2 pounds - her goal is 10lbs. I recently gave her the 2017 Portable Concentrator Guide, so Melanie Rollins prepare for portable oxygen therapy.    Expected Outcomes Patient will use pursed lip breathing during exercise and daily activities to control shortness of breath.   --  --  --  --   Row Name 04/11/16 1328 04/23/16 1521 05/02/16 1503         Core Components/Risk Factors/Patient Goals Review   Personal Goals Review Increase knowledge of respiratory medications and ability to use respiratory devices properly. Increase knowledge of respiratory medications and ability to use respiratory devices properly.;Improve shortness of breath with  ADL's;Develop more efficient breathing techniques such as purse lipped breathing and diaphragmatic  breathing and practicing self-pacing with activity.;Sedentary;Increase Strength and Stamina Increase knowledge of respiratory medications and ability to use respiratory devices properly.     Review Reviewed portable concentrators and have decided to use our concentrator for Melanie Rollins's exercise goals. She can assess the difference between intermittent and continuous and this will help her make a decision on which concentrator the buy. Melanie Rollins will be graduating in 8 sessions. She states she has improved with her strength with housework, but has increased shortness of breath and is now ready to commit to portable oxygen. Melanie Rollins has been using oxygen at 2l/m with her treadmill goals . She has dropped to mid 80"s % saturation on the TM and during her mid 33md. During her appointment with Dr FRaul Delon 05/01/16, she plans to discuss adding oxygen to her therapy due to the benefits during activity. Melanie FHester Matesdoes perform PLB with good technique. Melanie FHester Mateshad her appointment with Dr FRaul Del12/12/17. She now has oxygen ordered at 2l/m and will use Inogene. During her 6 minute walk today , her O2Sat's did stay in the 90's with 2l/m of oxygen. I did review her PFT and educated her on her results, especially with the DLCO which relates to her pulmonary fibrosis. Dr FRaul Deldoes want her to start on Ofev which Melanie FIzardis willing take if tolerated.     Expected Outcomes  --  -- Continue using oxygen for activity and Ofev for treatment of her pulmonary fibrosis.        Core Components/Risk Factors/Patient Goals at Discharge (Final Review):      Goals and Risk Factor Review - 05/02/16 1503      Core Components/Risk Factors/Patient Goals Review   Personal Goals Review Increase knowledge of respiratory medications and ability to use respiratory devices properly.   Review Melanie FHester Mateshad her appointment with Dr FRaul Del 05/01/16. She now has oxygen ordered at 2l/m and will use Inogene. During her 6 minute walk today , her O2Sat's did stay in the 90's with 2l/m of oxygen. I did review her PFT and educated her on her results, especially with the DLCO which relates to her pulmonary fibrosis. Dr FRaul Deldoes want her to start on Ofev which Melanie FGoveais willing take if tolerated.   Expected Outcomes Continue using oxygen for activity and Ofev for treatment of her pulmonary fibrosis.      ITP Comments:     ITP Comments    Row Name 02/15/16 1233 04/20/16 1057         ITP Comments BLanayhas done well in her first week of exercise. "Know Your Numbers" education was completed today with learning goals met.           Comments: 30 day note review

## 2016-05-08 NOTE — Patient Instructions (Signed)
Discharge Instructions  Patient Details  Name: Melanie Rollins MRN: [id number] Date of Birth: [birth year] Referring Provider:  Erby Pian, MD   Number of Visits: 22 Reason for Discharge:  Patient reached a stable level of exercise. Patient independent in their exercise.  Smoking History:  History  Smoking Status   Never Smoker  Smokeless Tobacco   Never Used    Diagnosis:  Pulmonary fibrosis ([place])  Initial Exercise Prescription:     Initial Exercise Prescription - 02/06/16 1500      Date of Initial Exercise RX and Referring Provider   Date 02/06/16   Referring Provider Wallene Huh MD     Treadmill   MPH 2.6   Grade 0.5   Minutes 15  2 min intervals (oxygen saturation dependent)   METs 3.71     Elliptical   Level 1   Speed 2.6   Minutes 15     T5 Nustep   Level 2   Watts --  60-80 spm   Minutes 15   METs 2     Prescription Details   Frequency (times per week) 3   Duration Progress to 45 minutes of aerobic exercise without signs/symptoms of physical distress     Intensity   THRR 40-80% of Max Heartrate 98-133   Ratings of Perceived Exertion 11-13   Perceived Dyspnea 0-4     Progression   Progression Continue to progress workloads to maintain intensity without signs/symptoms of physical distress.     Resistance Training   Training Prescription Yes   Weight 2 lbs   Reps 10-12      Discharge Exercise Prescription (Final Exercise Prescription Changes):     Exercise Prescription Changes - 05/08/16 0600      Exercise Review   Progression Yes     Response to Exercise   Blood Pressure (Admit) 112/62  Exercise goals and vital signs for 05/07/16   Blood Pressure (Exercise) 138/68   Blood Pressure (Exit) 102/52   Heart Rate (Admit) 84 bpm   Heart Rate (Exercise) 106 bpm   Heart Rate (Exit) 77 bpm   Oxygen Saturation (Admit) 97 %   Oxygen Saturation (Exercise) 96 %   Oxygen Saturation (Exit) 100 %   Rating of Perceived Exertion  (Exercise) 12   Perceived Dyspnea (Exercise) 3   Duration Progress to 45 minutes of aerobic exercise without signs/symptoms of physical distress   Intensity THRR unchanged     Progression   Progression Continue to progress workloads to maintain intensity without signs/symptoms of physical distress.     Resistance Training   Training Prescription Yes   Weight 3   Reps 10-12     Interval Training   Interval Training No     Oxygen   Oxygen Continuous   Liters 2  TM only     Treadmill   MPH 2.8   Grade 1.5   Minutes 15   METs 3.72     Recumbant Elliptical   Level 3   RPM 40   Minutes 15   METs 1.9     T5 Nustep   Level 3   Watts 30   Minutes 15   METs 2     Home Exercise Plan   Plans to continue exercise at [employer] (comment)   Frequency Add 2 additional days to program exercise sessions.  T/Th Silver Sneakers      Functional Capacity:     6 Minute Walk    Row Name 02/06/16 1556  03/21/16 1105 05/02/16 1201     6 Minute Walk   Phase Initial Mid Program Discharge   Distance 1275 feet 1525 feet 1488 feet   Distance % Change  -- 16.4 %  --   Walk Time 4.97 minutes 6 minutes 6 minutes   # of Rest Breaks 6  For oxygen desaturation: 7 sec, 6 sec, 10 sec, 11 sec, 12 sec, 16 sec 0 0   MPH 2.92 2.9 2.8   METS 2.98 2.9 3.6   RPE _0 Perceived Dyspnea  4  -- 4   VO2 Peak 10.44 13.47 12.68   Symptoms No No No   Resting HR 62 bpm 81 bpm 96 bpm   Resting BP 116/70 104/64 126/64   Max Ex. HR 95 bpm 128 bpm 124 bpm   Max Ex. BP 126/64 142/62 116/50   2 Minute Post BP 126/60  --  --     Interval HR   Baseline HR 62 81 96   1 Minute HR 70 104 115   2 Minute HR 90 115 118   3 Minute HR 93 119 122   4 Minute HR 95 119 123   5 Minute HR 90 118 124   6 Minute HR 88 128 122   2 Minute Post HR 62 93  --   Interval Heart Rate? Yes Yes Yes     Interval Oxygen   Interval Oxygen? Yes  -- Yes   Baseline Oxygen Saturation % 99 % 95 % 93 %   Baseline  Liters of Oxygen 0 L  Room Air  -- 2 L   1 Minute Oxygen Saturation % 78 %  quick recovery to 94%  95 % 94 %   1 Minute Liters of Oxygen 0 L  -- 2 L   2 Minute Oxygen Saturation % 80 % 90 % 93 %   2 Minute Liters of Oxygen 0 L  -- 2 L   3 Minute Oxygen Saturation % 91 %  80% at 2:44 87 % 92 %   3 Minute Liters of Oxygen 0 L  -- 2 L   4 Minute Oxygen Saturation % 95 %  77% at 3:34 89 % 91 %   4 Minute Liters of Oxygen 0 L  -- 2 L   5 Minute Oxygen Saturation % 92 %  79% at 4:27 89 % 90 %   5 Minute Liters of Oxygen 0 L  -- 2 L   6 Minute Oxygen Saturation % 85 %  81% at 5:14 85 % 92 %   6 Minute Liters of Oxygen 0 L  -- 2 L   2 Minute Post Oxygen Saturation % 100 % 91 %  --   2 Minute Post Liters of Oxygen 0 L  -- 2 L      Quality of Life:     Quality of Life - 05/04/16 1222      Quality of Life Scores   Health/Function Pre 24.63 %   Health/Function Post 18.63 %   Health/Function % Change -24.36 %   Socioeconomic Pre 21 %   Socioeconomic Post 29.64 %   Socioeconomic % Change  41.14 %   Psych/Spiritual Pre 21 %   Psych/Spiritual Post 27.43 %   Psych/Spiritual % Change 30.62 %   Family Pre 21 %   Family Post 30 %   Family % Change 42.86 %   GLOBAL Pre 22.66 %   GLOBAL  Post 24.21 %   GLOBAL % Change 6.84 %      Personal Goals: Goals established at orientation with interventions provided to work toward goal.     Personal Goals and Risk Factors at Admission - 02/06/16 1558      Core Components/Risk Factors/Patient Goals on Admission    Weight Management Yes   Intervention Weight Management: Develop a combined nutrition and exercise program designed to reach desired caloric intake, while maintaining appropriate intake of nutrient and fiber, sodium and fats, and appropriate energy expenditure required for the weight goal.;Weight Management: Provide education and appropriate resources to help participant work on and attain dietary goals.   Admit Weight 141 lb 11.2 oz  (64.3 kg)   Goal Weight: Short Term 138 lb (62.6 kg)   Goal Weight: Long Term 125 lb (56.7 kg)   Expected Outcomes Short Term: Continue to assess and modify interventions until short term weight is achieved;Long Term: Adherence to nutrition and physical activity/exercise program aimed toward attainment of established weight goal;Weight Maintenance: Understanding of the daily nutrition guidelines, which includes 25-35% calories from fat, 7% or less cal from saturated fats, less than 243m cholesterol, less than 1.5gm of sodium, & 5 or more servings of fruits and vegetables daily   Sedentary Yes   Intervention Provide advice, education, support and counseling about physical activity/exercise needs.;Develop an individualized exercise prescription for aerobic and resistive training based on initial evaluation findings, risk stratification, comorbidities and participant's personal goals.  Home Treadmill and Silver Sneaker Classes   Expected Outcomes Achievement of increased cardiorespiratory fitness and enhanced flexibility, muscular endurance and strength shown through measurements of functional capacity and personal statement of participant.   Increase Strength and Stamina Yes   Intervention Provide advice, education, support and counseling about physical activity/exercise needs.;Develop an individualized exercise prescription for aerobic and resistive training based on initial evaluation findings, risk stratification, comorbidities and participant's personal goals.   Expected Outcomes Achievement of increased cardiorespiratory fitness and enhanced flexibility, muscular endurance and strength shown through measurements of functional capacity and personal statement of participant.   Improve shortness of breath with ADL's Yes   Intervention Provide education, individualized exercise plan and daily activity instruction to help decrease symptoms of SOB with activities of daily living.  Complete housework with  less shortness of breath   Expected Outcomes Short Term: Achieves a reduction of symptoms when performing activities of daily living.   Develop more efficient breathing techniques such as purse lipped breathing and diaphragmatic breathing; and practicing self-pacing with activity Yes   Intervention Provide education, demonstration and support about specific breathing techniuqes utilized for more efficient breathing. Include techniques such as pursed lipped breathing, diaphragmatic breathing and self-pacing activity.   Expected Outcomes Short Term: Participant will be able to demonstrate and use breathing techniques as needed throughout daily activities.       Personal Goals Discharge:     Goals and Risk Factor Review - 05/08/16 0607      Core Components/Risk Factors/Patient Goals Review   Personal Goals Review Sedentary;Increase Strength and Stamina;Improve shortness of breath with ADL's;Increase knowledge of respiratory medications and ability to use respiratory devices properly.;Develop more efficient breathing techniques such as purse lipped breathing and diaphragmatic breathing and practicing self-pacing with activity.   Review Ms FHester Mateswill be graduating this week. She states she has really benefited from [place]- both the exercise and knowledge gained through education. Ms FGuevarrahas improved her post 676m by [id number] Importance Difference for Pulmonary Fibrosis is  78.72-147.30f. She plans to continue her exercise at home on her treadmill, Silver Sneaker classes on Wednesdat and Thursday, and Dance Fit class on Tuesday. Through monitoring O2Sat's, Ms FAliotohas gained the knowledge of the importance of oxygen therapy with activity to keep her O2 saturations in the 90's and now has an oxygen order from Dr FRaul Del Ms FCiullais planning to start the Ofev drug for Pulmonary Fibrosis.   Expected Outcomes Continue self management of her Pulmonary Fibrosis with exercise, oxygen, education  learned in [facility], and Ofev as tolerated.      Nutrition & Weight - Outcomes:     Pre Biometrics - 02/06/16 1602      Pre Biometrics   Height 5' 4.7" (1.643 m)   Weight 141 lb 11.2 oz (64.3 kg)   Waist Circumference 33.25 inches   Hip Circumference 37.5 inches   Waist to Hip Ratio 0.89 %   BMI (Calculated) 23.8         Post Biometrics - 05/02/16 1200       Post  Biometrics   Height 5' 4.7" (1.643 m)   Weight 141 lb 14.4 oz (64.4 kg)   Waist Circumference 33.5 inches   Hip Circumference 39.25 inches   Waist to Hip Ratio 0.85 %   BMI (Calculated) 23.9      Nutrition:   Nutrition Discharge:   Education Questionnaire Score:     Knowledge Questionnaire Score - 05/04/16 1222      Knowledge Questionnaire Score   Pre Score 9/10   Post Score 10/10      Goals reviewed with patient; copy given to patient.

## 2016-05-08 NOTE — Progress Notes (Signed)
Discharge Summary  Patient Details  Name: Melanie Rollins MRN: [id number] Date of Birth: 09/24/1945 Referring Provider:   Flowsheet Row Pulmonary Rehab from 02/06/2016 in [facility] Cardiac and Pulmonary Rehab  Referring Provider  Wallene Huh MD       Number of Visits: 36 Reason for Discharge:  Patient reached a stable level of exercise. Patient independent in their exercise.  Smoking History:  History  Smoking Status   Never Smoker  Smokeless Tobacco   Never Used    Diagnosis:  Pulmonary fibrosis ([place])  ADL [facility]:     Pulmonary Assessment Scores    Row Name 02/06/16 1555 03/26/16 0628 05/04/16 1221     ADL [facility]   ADL Phase Entry Mid Exit   SOB Score total 46 51 64   Rest 0 0 0   Walk _0 Stairs _1 Bath _2 Dress _3 Shop _4 Initial Exercise Prescription:     Initial Exercise Prescription - 02/06/16 1500      Date of Initial Exercise RX and Referring Provider   Date 02/06/16   Referring Provider Wallene Huh MD     Treadmill   MPH 2.6   Grade 0.5   Minutes 15  2 min intervals (oxygen saturation dependent)   METs 3.71     Elliptical   Level 1   Speed 2.6   Minutes 15     T5 Nustep   Level 2   Watts --  60-80 spm   Minutes 15   METs 2     Prescription Details   Frequency (times per week) 3   Duration Progress to 45 minutes of aerobic exercise without signs/symptoms of physical distress     Intensity   THRR 40-80% of Max Heartrate 98-133   Ratings of Perceived Exertion 11-13   Perceived Dyspnea 0-4     Progression   Progression Continue to progress workloads to maintain intensity without signs/symptoms of physical distress.     Resistance Training   Training Prescription Yes   Weight 2 lbs   Reps 10-12      Discharge Exercise Prescription (Final Exercise Prescription Changes):     Exercise Prescription Changes - 05/08/16 0600      Exercise Review   Progression Yes     Response to Exercise    Blood Pressure (Admit) 112/62  Exercise goals and vital signs for 05/07/16   Blood Pressure (Exercise) 138/68   Blood Pressure (Exit) 102/52   Heart Rate (Admit) 84 bpm   Heart Rate (Exercise) 106 bpm   Heart Rate (Exit) 77 bpm   Oxygen Saturation (Admit) 97 %   Oxygen Saturation (Exercise) 96 %   Oxygen Saturation (Exit) 100 %   Rating of Perceived Exertion (Exercise) 12   Perceived Dyspnea (Exercise) 3   Duration Progress to 45 minutes of aerobic exercise without signs/symptoms of physical distress   Intensity THRR unchanged     Progression   Progression Continue to progress workloads to maintain intensity without signs/symptoms of physical distress.     Resistance Training   Training Prescription Yes   Weight 3   Reps 10-12     Interval Training   Interval Training No     Oxygen   Oxygen Continuous   Liters 2  TM only     Treadmill   MPH 2.8   Grade 1.5   Minutes  15   METs 3.72     Recumbant Elliptical   Level 3   RPM 40   Minutes 15   METs 1.9     T5 Nustep   Level 3   Watts 30   Minutes 15   METs 2     Home Exercise Plan   Plans to continue exercise at [employer] (comment)   Frequency Add 2 additional days to program exercise sessions.  T/Th Silver Sneakers      Functional Capacity:     6 Minute Walk    Row Name 02/06/16 1556 03/21/16 1105 05/02/16 1201     6 Minute Walk   Phase Initial Mid Program Discharge   Distance 1275 feet 1525 feet 1488 feet   Distance % Change  -- 16.4 %  --   Walk Time 4.97 minutes 6 minutes 6 minutes   # of Rest Breaks 6  For oxygen desaturation: 7 sec, 6 sec, 10 sec, 11 sec, 12 sec, 16 sec 0 0   MPH 2.92 2.9 2.8   METS 2.98 2.9 3.6   RPE _0 Perceived Dyspnea  4  -- 4   VO2 Peak 10.44 13.47 12.68   Symptoms No No No   Resting HR 62 bpm 81 bpm 96 bpm   Resting BP 116/70 104/64 126/64   Max Ex. HR 95 bpm 128 bpm 124 bpm   Max Ex. BP 126/64 142/62 116/50   2 Minute Post BP 126/60  --  --      Interval HR   Baseline HR 62 81 96   1 Minute HR 70 104 115   2 Minute HR 90 115 118   3 Minute HR 93 119 122   4 Minute HR 95 119 123   5 Minute HR 90 118 124   6 Minute HR 88 128 122   2 Minute Post HR 62 93  --   Interval Heart Rate? Yes Yes Yes     Interval Oxygen   Interval Oxygen? Yes  -- Yes   Baseline Oxygen Saturation % 99 % 95 % 93 %   Baseline Liters of Oxygen 0 L  Room Air  -- 2 L   1 Minute Oxygen Saturation % 78 %  quick recovery to 94%  95 % 94 %   1 Minute Liters of Oxygen 0 L  -- 2 L   2 Minute Oxygen Saturation % 80 % 90 % 93 %   2 Minute Liters of Oxygen 0 L  -- 2 L   3 Minute Oxygen Saturation % 91 %  80% at 2:44 87 % 92 %   3 Minute Liters of Oxygen 0 L  -- 2 L   4 Minute Oxygen Saturation % 95 %  77% at 3:34 89 % 91 %   4 Minute Liters of Oxygen 0 L  -- 2 L   5 Minute Oxygen Saturation % 92 %  79% at 4:27 89 % 90 %   5 Minute Liters of Oxygen 0 L  -- 2 L   6 Minute Oxygen Saturation % 85 %  81% at 5:14 85 % 92 %   6 Minute Liters of Oxygen 0 L  -- 2 L   2 Minute Post Oxygen Saturation % 100 % 91 %  --   2 Minute Post Liters of Oxygen 0 L  -- 2 L      Psychological, QOL, Others - Outcomes: PHQ  2/9: Depression screen [facility] 2/9 05/04/2016 02/06/2016 12/13/2015 07/04/2015 06/08/2015  Decreased Interest 0 0 0 0 0  Down, Depressed, Hopeless 0 0 0 0 0  PHQ - 2 Score 0 0 0 0 0  Altered sleeping 1 1 - - -  Tired, decreased energy 2 3 - - -  Change in appetite 0 0 - - -  Feeling bad or failure about yourself  0 0 - - -  Trouble concentrating 0 0 - - -  Moving slowly or fidgety/restless 0 0 - - -  Suicidal thoughts 0 0 - - -  PHQ-9 Score 3 4 - - -  Difficult doing work/chores Somewhat difficult Somewhat difficult - - -    Quality of Life:     Quality of Life - 05/04/16 1222      Quality of Life Scores   Health/Function Pre 24.63 %   Health/Function Post 18.63 %   Health/Function % Change -24.36 %   Socioeconomic Pre 21 %   Socioeconomic Post 29.64  %   Socioeconomic % Change  41.14 %   Psych/Spiritual Pre 21 %   Psych/Spiritual Post 27.43 %   Psych/Spiritual % Change 30.62 %   Family Pre 21 %   Family Post 30 %   Family % Change 42.86 %   GLOBAL Pre 22.66 %   GLOBAL Post 24.21 %   GLOBAL % Change 6.84 %      Personal Goals: Goals established at orientation with interventions provided to work toward goal.     Personal Goals and Risk Factors at Admission - 02/06/16 1558      Core Components/Risk Factors/Patient Goals on Admission    Weight Management Yes   Intervention Weight Management: Develop a combined nutrition and exercise program designed to reach desired caloric intake, while maintaining appropriate intake of nutrient and fiber, sodium and fats, and appropriate energy expenditure required for the weight goal.;Weight Management: Provide education and appropriate resources to help participant work on and attain dietary goals.   Admit Weight 141 lb 11.2 oz (64.3 kg)   Goal Weight: Short Term 138 lb (62.6 kg)   Goal Weight: Long Term 125 lb (56.7 kg)   Expected Outcomes Short Term: Continue to assess and modify interventions until short term weight is achieved;Long Term: Adherence to nutrition and physical activity/exercise program aimed toward attainment of established weight goal;Weight Maintenance: Understanding of the daily nutrition guidelines, which includes 25-35% calories from fat, 7% or less cal from saturated fats, less than 24m cholesterol, less than 1.5gm of sodium, & 5 or more servings of fruits and vegetables daily   Sedentary Yes   Intervention Provide advice, education, support and counseling about physical activity/exercise needs.;Develop an individualized exercise prescription for aerobic and resistive training based on initial evaluation findings, risk stratification, comorbidities and participant's personal goals.  Home Treadmill and Silver Sneaker Classes   Expected Outcomes Achievement of increased  cardiorespiratory fitness and enhanced flexibility, muscular endurance and strength shown through measurements of functional capacity and personal statement of participant.   Increase Strength and Stamina Yes   Intervention Provide advice, education, support and counseling about physical activity/exercise needs.;Develop an individualized exercise prescription for aerobic and resistive training based on initial evaluation findings, risk stratification, comorbidities and participant's personal goals.   Expected Outcomes Achievement of increased cardiorespiratory fitness and enhanced flexibility, muscular endurance and strength shown through measurements of functional capacity and personal statement of participant.   Improve shortness of breath with ADL's Yes   Intervention  Provide education, individualized exercise plan and daily activity instruction to help decrease symptoms of SOB with activities of daily living.  Complete housework with less shortness of breath   Expected Outcomes Short Term: Achieves a reduction of symptoms when performing activities of daily living.   Develop more efficient breathing techniques such as purse lipped breathing and diaphragmatic breathing; and practicing self-pacing with activity Yes   Intervention Provide education, demonstration and support about specific breathing techniuqes utilized for more efficient breathing. Include techniques such as pursed lipped breathing, diaphragmatic breathing and self-pacing activity.   Expected Outcomes Short Term: Participant will be able to demonstrate and use breathing techniques as needed throughout daily activities.       Personal Goals Discharge:     Goals and Risk Factor Review    Row Name 02/13/16 1419 02/20/16 1456 02/27/16 0922 02/27/16 0932 03/26/16 0616     Core Components/Risk Factors/Patient Goals Review   Personal Goals Review Develop more efficient breathing techniques such as purse lipped breathing and  diaphragmatic breathing and practicing self-pacing with activity. Develop more efficient breathing techniques such as purse lipped breathing and diaphragmatic breathing and practicing self-pacing with activity.;Other Sedentary;Improve shortness of breath with ADL's  -- Increase knowledge of respiratory medications and ability to use respiratory devices properly.;Develop more efficient breathing techniques such as purse lipped breathing and diaphragmatic breathing and practicing self-pacing with activity.;Improve shortness of breath with ADL's;Increase Strength and Stamina;Sedentary;Weight Management/Obesity   Review Pursed lip breathing techniques were discussed with the patient. He patient demonstrated understanding of these techniques.  Ms Bayon was cued to perform PLB after her EL exercise. She had increased her time to 85mns from set work and benefited from the PLB to recover from her shortness of breath. Also I gave her a copy of the Pulmonary Fibrosis Handbook from the PF Association.  Unfortunately she does not drive at night and can not attend the PF Support Group. Ms FLaskystates she has noticed an improvement in certain activities, such as getting up from a chair, vaccuming, and getting out of her car. She has performed PLB for mahy years and knows the benefit of the technique. She plans to continue at [employer] add one day of the [employer]. Ms FBartnickstates she has noticed an improvement in certain activities, such as getting up from a chair, vaccuming, and getting out of her car. She has performed PLB for many years and knows the benefit of the technique. [facility] day of the S[employer] Storesclass. Ms FFluryis progressing with her exercise goals and continues with her activity at home. She is checking her O2Sat's with her oximeter and stops her activity, such as dusting or making the bed, when her shortness of breaath increases and O2Sat's are low.  During exercise on the TM and EL, we have been using 2l/m of oxygen to improve O2Sat's and work of breathing. Ms FSmejkalhas improved mid644m by [id number] has also lost 2 pounds - her goal is 10lbs. I recently gave her the 2017 Portable Concentrator Guide, so Ms FuqBaikn prepare for portable oxygen therapy.    Expected Outcomes Patient will use pursed lip breathing during exercise and daily activities to control shortness of breath.   --  --  --  --   Row Name 04/11/16 1328 04/23/16 1521 05/02/16 1503 05/08/16 0607       Core Components/Risk Factors/Patient Goals Review   Personal Goals Review Increase knowledge  of respiratory medications and ability to use respiratory devices properly. Increase knowledge of respiratory medications and ability to use respiratory devices properly.;Improve shortness of breath with ADL's;Develop more efficient breathing techniques such as purse lipped breathing and diaphragmatic breathing and practicing self-pacing with activity.;Sedentary;Increase Strength and Stamina Increase knowledge of respiratory medications and ability to use respiratory devices properly. Sedentary;Increase Strength and Stamina;Improve shortness of breath with ADL's;Increase knowledge of respiratory medications and ability to use respiratory devices properly.;Develop more efficient breathing techniques such as purse lipped breathing and diaphragmatic breathing and practicing self-pacing with activity.    Review Reviewed portable concentrators and have decided to use our concentrator for Ms Nazareno's exercise goals. She can assess the difference between intermittent and continuous and this will help her make a decision on which concentrator the buy. Ms Hester Mates will be graduating in 8 sessions. She states she has improved with her strength with housework, but has increased shortness of breath and is now ready to commit to portable oxygen. Ms Hester Mates has been using oxygen at 2l/m with her treadmill goals . She has  dropped to mid 80"s % saturation on the TM and during her mid 59md. During her appointment with Dr FRaul Delon 05/01/16, she plans to discuss adding oxygen to her therapy due to the benefits during activity. Ms FHester Matesdoes perform PLB with good technique. Ms FHester Mateshad her appointment with Dr FRaul Del12/12/17. She now has oxygen ordered at 2l/m and will use Inogene. During her 6 minute walk today , her O2Sat's did stay in the 90's with 2l/m of oxygen. I did review her PFT and educated her on her results, especially with the DLCO which relates to her pulmonary fibrosis. Dr FRaul Deldoes want her to start on Ofev which Ms FBlankeis willing take if tolerated. Ms FHester Mateswill be graduating this week. She states she has really benefited from [employer]La[employer]oko- both the exercise and knowledge gained through education. Ms FMeloyhas improved her post 666m by [id number] Importance Difference for Pulmonary Fibrosis is 78.72-147.6ft26fhe plans to continue her exercise at home on her treadmill, Silver Sneaker classes on Wednesdat and Thursday, and Dance Fit class on Tuesday. Through monitoring O2Sat's, Ms FuquCranford gained the knowledge of the importance of oxygen therapy with activity to keep her O2 saturations in the 90's and now has an oxygen order from Dr FlemRaul Del FuquBarschplanning to start the Ofev drug for Pulmonary Fibrosis.    Expected Outcomes  --  -- Continue using oxygen for activity and Ofev for treatment of her pulmonary fibrosis. Continue self management of her Pulmonary Fibrosis with exercise, oxygen, education learned in [facility], and Ofev as tolerated.       Nutrition & Weight - Outcomes:     Pre Biometrics - 02/06/16 1602      Pre Biometrics   Height 5' 4.7" (1.643 m)   Weight 141 lb 11.2 oz (64.3 kg)   Waist Circumference 33.25 inches   Hip Circumference 37.5 inches   Waist to Hip Ratio 0.89 %   BMI (Calculated) 23.8         Post Biometrics - 05/02/16 1200       Post  Biometrics   Height 5'  4.7" (1.643 m)   Weight 141 lb 14.4 oz (64.4 kg)   Waist Circumference 33.5 inches   Hip Circumference 39.25 inches   Waist to Hip Ratio 0.85 %   BMI (Calculated) 23.9      Nutrition:   Nutrition Discharge:  Education Questionnaire Score:     Knowledge Questionnaire Score - 05/04/16 1222      Knowledge Questionnaire Score   Pre Score 9/10   Post Score 10/10      Goals reviewed with patient; copy given to patient.

## 2016-05-09 DIAGNOSIS — J8410 Pulmonary fibrosis, unspecified: Secondary | ICD-10-CM | POA: Diagnosis not present

## 2016-05-09 NOTE — Progress Notes (Signed)
Daily Session Note  Patient Details  Name: SHALAINA GUARDIOLA MRN: [id number] Date of Birth: Aug 05, 1945 Referring Provider:   Flowsheet Row Pulmonary Rehab from 02/06/2016 in [facility] Cardiac and Pulmonary Rehab  Referring Provider  Wallene Huh MD      Encounter Date: 05/09/2016  Check In:     Session Check In - 05/09/16 1055      Check-In   Location [facility]-Cardiac & Pulmonary Rehab   Staff Present Carson Myrtle, BS, RRT, Respiratory Lennie Hummer, MA, ACSM RCEP, Exercise Physiologist;Daylyn Christine Oletta Darter, BA, ACSM CEP, Exercise Physiologist   Supervising physician immediately available to respond to emergencies See telemetry face sheet for immediately available ER MD   Physician(s) Clearnce Hasten and Alfred Levins   Medication changes reported     No   Fall or balance concerns reported    No   Warm-up and Cool-down Performed as group-led instruction   Resistance Training Performed Yes   VAD Patient? No     Pain Assessment   Currently in Pain? No/denies         Goals Met:  Proper associated with RPD/PD & O2 Sat Independence with exercise equipment Exercise tolerated well Strength training completed today  Goals Unmet:  Not Applicable  Comments: Pt able to follow exercise prescription today without complaint.  Will continue to monitor for progression.    Dr. Emily Filbert is Medical Director for [facility] and [facility].

## 2016-05-11 ENCOUNTER — Encounter: Payer: Medicare Other | Admitting: *Deleted

## 2016-05-11 DIAGNOSIS — J8410 Pulmonary fibrosis, unspecified: Secondary | ICD-10-CM

## 2016-05-11 NOTE — Progress Notes (Signed)
Discharge Summary  Patient Details  Name: Melanie Rollins MRN: [id number] Date of Birth: 09/21/1945 Referring Provider:   Flowsheet Row Pulmonary Rehab from 02/06/2016 in [facility] Cardiac and Pul[facility] Rehab  Referring Provider  Wallene Huh MD       Number of Visits: 36  Reason for Discharge:  Patient reached a stable level of exercise. Patient independent in their exercise.  Smoking History:  History  Smoking Status  . Never Smoker  Smokeless Tobacco  . Never Used    Diagnosis:  Pulmonary fibrosis ([place])  ADL [facility]SD:     Pulmonary Assessment Scores    Row Name 02/06/16 1555 03/26/16 0628 05/04/16 1221     ADL UCSD   ADL Phase Entry Mid Exit   SOB Score total 46 51 64   Rest 0 0 0   Walk _0 Stairs _1 Bath _2 Dress _3 Shop _4 Initial Exercise Prescription:     Initial Exercise Prescription - 02/06/16 1500      Date of Initial Exercise RX and Referring Provider   Date 02/06/16   Referring Provider Wallene Huh MD     Treadmill   MPH 2.6   Grade 0.5   Minutes 15  2 min intervals (oxygen saturation dependent)   METs 3.71     Elliptical   Level 1   Speed 2.6   Minutes 15     T5 Nustep   Level 2   Watts --  60-80 spm   Minutes 15   METs 2     Prescription Details   Frequency (times per week) 3   Duration Progress to 45 minutes of aerobic exercise without signs/symptoms of physical distress     Intensity   THRR 40-80% of Max Heartrate 98-133   Ratings of Perceived Exertion 11-13   Perceived Dyspnea 0-4     Progression   Progression Continue to progress workloads to maintain intensity without signs/symptoms of physical distress.     Resistance Training   Training Prescription Yes   Weight 2 lbs   Reps 10-12      Discharge Exercise Prescription (Final Exercise Prescription Changes):     Exercise Prescription Changes - 05/10/16 1100      Exercise Review   Progression Yes     Response to Exercise    Blood Pressure (Admit) 118/60   Blood Pressure (Exercise) 138/70   Blood Pressure (Exit) 132/56   Heart Rate (Admit) 70 bpm   Heart Rate (Exercise) 108 bpm   Heart Rate (Exit) 82 bpm   Oxygen Saturation (Admit) 97 %   Oxygen Saturation (Exercise) 92 %   Oxygen Saturation (Exit) 98 %   Rating of Perceived Exertion (Exercise) 12   Perceived Dyspnea (Exercise) 3   Duration Progress to 45 minutes of aerobic exercise without signs/symptoms of physical distress   Intensity THRR unchanged     Progression   Progression Continue to progress workloads to maintain intensity without signs/symptoms of physical distress.     Resistance Training   Training Prescription Yes   Weight 3   Reps 10-12     Interval Training   Interval Training No     Oxygen   Oxygen Continuous   Liters 2     Treadmill   MPH 2.8   Grade 1.5   Minutes 15   METs 3.72     Recumbant  Elliptical   Level 3   RPM 40   Minutes 15     T5 Nustep   Level 3   Minutes 15   METs 2      Functional Capacity:     6 Minute Walk    Row Name 02/06/16 1556 03/21/16 1105 05/02/16 1201     6 Minute Walk   Phase Initial Mid Program Discharge   Distance 1275 feet 1525 feet 1488 feet   Distance % Change  - 16.4 %  -   Walk Time 4.97 minutes 6 minutes 6 minutes   # of Rest Breaks 6  For oxygen desaturation: 7 sec, 6 sec, 10 sec, 11 sec, 12 sec, 16 sec 0 0   MPH 2.92 2.9 2.8   METS 2.98 2.9 3.6   RPE _0 Perceived Dyspnea  4  - 4   VO2 Peak 10.44 13.47 12.68   Symptoms No No No   Resting HR 62 bpm 81 bpm 96 bpm   Resting BP 116/70 104/64 126/64   Max Ex. HR 95 bpm 128 bpm 124 bpm   Max Ex. BP 126/64 142/62 116/50   2 Minute Post BP 126/60  -  -     Interval HR   Baseline HR 62 81 96   1 Minute HR 70 104 115   2 Minute HR 90 115 118   3 Minute HR 93 119 122   4 Minute HR 95 119 123   5 Minute HR 90 118 124   6 Minute HR 88 128 122   2 Minute Post HR 62 93  -   Interval Heart Rate? Yes Yes Yes      Interval Oxygen   Interval Oxygen? Yes  - Yes   Baseline Oxygen Saturation % 99 % 95 % 93 %   Baseline Liters of Oxygen 0 L  Room Air  - 2 L   1 Minute Oxygen Saturation % 78 %  quick recovery to 94%  95 % 94 %   1 Minute Liters of Oxygen 0 L  - 2 L   2 Minute Oxygen Saturation % 80 % 90 % 93 %   2 Minute Liters of Oxygen 0 L  - 2 L   3 Minute Oxygen Saturation % 91 %  80% at 2:44 87 % 92 %   3 Minute Liters of Oxygen 0 L  - 2 L   4 Minute Oxygen Saturation % 95 %  77% at 3:34 89 % 91 %   4 Minute Liters of Oxygen 0 L  - 2 L   5 Minute Oxygen Saturation % 92 %  79% at 4:27 89 % 90 %   5 Minute Liters of Oxygen 0 L  - 2 L   6 Minute Oxygen Saturation % 85 %  81% at 5:14 85 % 92 %   6 Minute Liters of Oxygen 0 L  - 2 L   2 Minute Post Oxygen Saturation % 100 % 91 %  -   2 Minute Post Liters of Oxygen 0 L  - 2 L      Psychological, QOL, Others - Outcomes: PHQ 2/9: Depression screen [facility] 2/9 05/04/2016 02/06/2016 12/13/2015 07/04/2015 06/08/2015  Decreased Interest 0 0 0 0 0  Down, Depressed, Hopeless 0 0 0 0 0  PHQ - 2 Score 0 0 0 0 0  Altered sleeping 1 1 - - -  Tired, decreased energy 2  3 - - -  Change in appetite 0 0 - - -  Feeling bad or failure about yourself  0 0 - - -  Trouble concentrating 0 0 - - -  Moving slowly or fidgety/restless 0 0 - - -  Suicidal thoughts 0 0 - - -  PHQ-9 Score 3 4 - - -  Difficult doing work/chores Somewhat difficult Somewhat difficult - - -    Quality of Life:     Quality of Life - 05/04/16 1222      Quality of Life Scores   Health/Function Pre 24.63 %   Health/Function Post 18.63 %   Health/Function % Change -24.36 %   Socioeconomic Pre 21 %   Socioeconomic Post 29.64 %   Socioeconomic % Change  41.14 %   Psych/Spiritual Pre 21 %   Psych/Spiritual Post 27.43 %   Psych/Spiritual % Change 30.62 %   Family Pre 21 %   Family Post 30 %   Family % Change 42.86 %   GLOBAL Pre 22.66 %   GLOBAL Post 24.21 %   GLOBAL % Change 6.84 %       Personal Goals: Goals established at orientation with interventions provided to work toward goal.     Personal Goals and Risk Factors at Admission - 02/06/16 1558      Core Components/Risk Factors/Patient Goals on Admission    Weight Management Yes   Intervention Weight Management: Develop a combined nutrition and exercise program designed to reach desired caloric intake, while maintaining appropriate intake of nutrient and fiber, sodium and fats, and appropriate energy expenditure required for the weight goal.;Weight Management: Provide education and appropriate resources to help participant work on and attain dietary goals.   Admit Weight 141 lb 11.2 oz (64.3 kg)   Goal Weight: Short Term 138 lb (62.6 kg)   Goal Weight: Long Term 125 lb (56.7 kg)   Expected Outcomes Short Term: Continue to assess and modify interventions until short term weight is achieved;Long Term: Adherence to nutrition and physical activity/exercise program aimed toward attainment of established weight goal;Weight Maintenance: Understanding of the daily nutrition guidelines, which includes 25-35% calories from fat, 7% or less cal from saturated fats, less than 244m cholesterol, less than 1.5gm of sodium, & 5 or more servings of fruits and vegetables daily   Sedentary Yes   Intervention Provide advice, education, support and counseling about physical activity/exercise needs.;Develop an individualized exercise prescription for aerobic and resistive training based on initial evaluation findings, risk stratification, comorbidities and participant's personal goals.  Home Treadmill and Silver Sneaker Classes   Expected Outcomes Achievement of increased cardiorespiratory fitness and enhanced flexibility, muscular endurance and strength shown through measurements of functional capacity and personal statement of participant.   Increase Strength and Stamina Yes   Intervention Provide advice, education, support and counseling  about physical activity/exercise needs.;Develop an individualized exercise prescription for aerobic and resistive training based on initial evaluation findings, risk stratification, comorbidities and participant's personal goals.   Expected Outcomes Achievement of increased cardiorespiratory fitness and enhanced flexibility, muscular endurance and strength shown through measurements of functional capacity and personal statement of participant.   Improve shortness of breath with ADL's Yes   Intervention Provide education, individualized exercise plan and daily activity instruction to help decrease symptoms of SOB with activities of daily living.  Complete housework with less shortness of breath   Expected Outcomes Short Term: Achieves a reduction of symptoms when performing activities of daily living.   Develop more efficient  breathing techniques such as purse lipped breathing and diaphragmatic breathing; and practicing self-pacing with activity Yes   Intervention Provide education, demonstration and support about specific breathing techniuqes utilized for more efficient breathing. Include techniques such as pursed lipped breathing, diaphragmatic breathing and self-pacing activity.   Expected Outcomes Short Term: Participant will be able to demonstrate and use breathing techniques as needed throughout daily activities.       Personal Goals Discharge:     Goals and Risk Factor Review    Row Name 02/13/16 1419 02/20/16 1456 02/27/16 0922 02/27/16 0932 03/26/16 0616     Core Components/Risk Factors/Patient Goals Review   Personal Goals Review Develop more efficient breathing techniques such as purse lipped breathing and diaphragmatic breathing and practicing self-pacing with activity. Develop more efficient breathing techniques such as purse lipped breathing and diaphragmatic breathing and practicing self-pacing with activity.;Other Sedentary;Improve shortness of breath with ADL's  - Increase  knowledge of respiratory medications and ability to use respiratory devices properly.;Develop more efficient breathing techniques such as purse lipped breathing and diaphragmatic breathing and practicing self-pacing with activity.;Improve shortness of breath with ADL's;Increase Strength and Stamina;Sedentary;Weight Management/Obesity   Review Pursed lip breathing techniques were discussed with the patient. He patient demonstrated understanding of these techniques.  Ms Heath was cued to perform PLB after her EL exercise. She had increased her time to 74mns from set work and benefited from the PLB to recover from her shortness of breath. Also I gave her a copy of the Pulmonary Fibrosis Handbook from the [employer] Association.  Unfortunately she does not drive at night and can not attend the PF Support Group. Ms FCastrostates she has noticed an improvement in certain activities, such as getting up from a chair, vaccuming, and getting out of her car. She has performed PLB for mahy years and knows the benefit of the technique. [facility] one day of the S[employer] Storesclass. Ms FMedicostates she has noticed an improvement in certain activities, such as getting up from a chair, vaccuming, and getting out of her car. She has performed PLB for many years and knows the benefit of the technique. [facility] of the S[employer] Storesclass. Ms FFiddleris progressing with her exercise goals and continues with her activity at home. She is checking her O2Sat's with her oximeter and stops her activity, such as dusting or making the bed, when her shortness of breaath increases and O2Sat's are low. During exercise on the TM and EL, we have been using 2l/m of oxygen to improve O2Sat's and work of breathing. Ms FBuhrmanhas improved mid656m by [id number] has also lost 2 pounds - her goal is 10lbs. I recently gave her the 2017 Portable Concentrator Guide, so Ms FuqNistlern prepare  for portable oxygen therapy.    Expected Outcomes Patient will use pursed lip breathing during exercise and daily activities to control shortness of breath.   -  -  -  -   Row Name 04/11/16 1328 04/23/16 1521 05/02/16 1503 05/08/16 0607       Core Components/Risk Factors/Patient Goals Review   Personal Goals Review Increase knowledge of respiratory medications and ability to use respiratory devices properly. Increase knowledge of respiratory medications and ability to use respiratory devices properly.;Improve shortness of breath with ADL's;Develop more efficient breathing techniques such as purse lipped breathing and diaphragmatic breathing and practicing self-pacing with activity.;Sedentary;Increase Strength and Stamina Increase knowledge of  respiratory medications and ability to use respiratory devices properly. Sedentary;Increase Strength and Stamina;Improve shortness of breath with ADL's;Increase knowledge of respiratory medications and ability to use respiratory devices properly.;Develop more efficient breathing techniques such as purse lipped breathing and diaphragmatic breathing and practicing self-pacing with activity.    Review Reviewed portable concentrators and have decided to use our concentrator for Ms Cappucci's exercise goals. She can assess the difference between intermittent and continuous and this will help her make a decision on which concentrator the buy. Ms Hester Mates will be graduating in 8 sessions. She states she has improved with her strength with housework, but has increased shortness of breath and is now ready to commit to portable oxygen. Ms Hester Mates has been using oxygen at 2l/m with her treadmill goals . She has dropped to mid 80"s % saturation on the TM and during her mid 35md. During her appointment with Dr FRaul Delon 05/01/16, she plans to discuss adding oxygen to her therapy due to the benefits during activity. Ms FHester Matesdoes perform PLB with good technique. Ms FHester Mateshad her appointment  with Dr FRaul Del12/12/17. She now has oxygen ordered at 2l/m and will use In[place]. During her 6 minute walk today , her O2Sat's did stay in the 90's with 2l/m of oxygen. I did review her PFT and educated her on her results, especially with the DLCO which relates to her pulmonary fibrosis. Dr FRaul Deldoes want her to start on Ofev which Ms FFoskeyis willing take if tolerated. Ms FHester Mateswill be graduating this week. She states [place] both [place] and knowledge gained through education. Ms FDrumwrighthas improved her post 626m by [id number] Importance Difference for Pulmonary Fibrosis is 78.72-147.6ft79fhe plans to continue her exercise at home on her treadmill, Silver Sneaker classes on Wednesdat and Thursday, and Dance Fit class on Tuesday. Through monitoring O2Sat's, Ms FuquBeever gained the knowledge of the importance of oxygen therapy with activity to keep her O2 saturations in the 90's and now has an oxygen order from Dr FlemRaul Del FuquHagenowplanning to start the Ofev drug for Pulmonary Fibrosis.    Expected Outcomes  -  - Continue using oxygen for activity and Ofev for treatment of her pulmonary fibrosis. Continue self management of her Pulmonary Fibrosis with exercise, oxygen, education learned in [facility], and Ofev as tolerated.       Nutrition & Weight - Outcomes:     Pre Biometrics - 02/06/16 1602      Pre Biometrics   Height 5' 4.7" (1.643 m)   Weight 141 lb 11.2 oz (64.3 kg)   Waist Circumference 33.25 inches   Hip Circumference 37.5 inches   Waist to Hip Ratio 0.89 %   BMI (Calculated) 23.8         Post Biometrics - 05/02/16 1200       Post  Biometrics   Height 5' 4.7" (1.643 m)   Weight 141 lb 14.4 oz (64.4 kg)   Waist Circumference 33.5 inches   Hip Circumference 39.25 inches   Waist to Hip Ratio 0.85 %   BMI (Calculated) 23.9      Nutrition:   Nutrition Discharge:   Education Questionnaire Score:     Knowledge Questionnaire  Score - 05/04/16 1222      Knowledge Questionnaire Score   Pre Score 9/10   Post Score 10/10      Goals reviewed with patient; copy given to patient.

## 2016-05-11 NOTE — Progress Notes (Signed)
Daily Session Note  Patient Details  Name: Melanie Rollins MRN: [id number] Date of Birth: 10-11-1945 Referring Provider:   Flowsheet Row Pulmonary Rehab from 02/06/2016 in [facility] Cardiac and Pul[facility] Rehab  Referring Provider  Wallene Huh MD      Encounter Date: 05/11/2016  Check In:     Session Check In - 05/11/16 1039      Check-In   Location [facility]-Cardiac & Pulmonary Rehab   Staff Present Gerlene Burdock, RN, Vickki Hearing, BA, ACSM CEP, Exercise Physiologist;Milad Bublitz RN BSN   Supervising physician immediately available to respond to emergencies [facility] immediately available ER MD   Physician(s) Dr. Joni Fears and Dr. Reita Cliche   Medication changes reported     No   Fall or balance concerns reported    No   Warm-up and Cool-down Performed as group-led instruction   Resistance Training Performed Yes   VAD Patient? No     Pain Assessment   Currently in Pain? No/denies         Goals Met:  Proper associated with RPD/PD & O2 Sat Independence with exercise equipment Using PLB without cueing & demonstrates good technique Exercise tolerated well No report of cardiac concerns or symptoms Strength training completed today  Goals Unmet:  Not Applicable  Comments:  Damiya graduated today from cardiac rehab with 36 sessions completed.  Details of the patient's exercise prescription and what she needs to do in order to continue the prescription and progress were discussed with patient.  Patient was given a copy of prescription and goals.  Patient verbalized understanding.  Teva plans to continue to exercise by  Coming to silver sneakers program here at [facility] rehab facility and has signed up for a jazzercise class at the [facility].  She states she also has some videos at home, along with her treadmill that she plans to use on days she doesn't have a class to go to.      Dr. Emily Filbert is Medical Director for [facility] and  [facility].

## 2016-05-11 NOTE — Progress Notes (Signed)
Pulmonary Individual Treatment Plan  Patient Details  Name: ARDYS HATAWAY MRN: [id number] Date of Birth: 1946/03/08 Referring Provider:   Flowsheet Row Pulmonary Rehab from 02/06/2016 in [facility] Cardiac and Pulmonary Rehab  Referring Provider  Wallene Huh MD      Initial Encounter Date:  Flowsheet Row Pulmonary Rehab from 02/06/2016 in [facility] Cardiac and Pulmonary Rehab  Date  02/06/16  Referring Provider  Wallene Huh MD      Visit Diagnosis: Pulmonary fibrosis [facility])  Patient's Home Medications on Admission:  Current Outpatient Prescriptions:  .  Acetylcysteine (NAC) 600 MG CAPS, Take by mouth. Take 2 tablets daily, Disp: , Rfl:  .  albuterol (PROVENTIL HFA;VENTOLIN HFA) 108 (90 BASE) MCG/ACT inhaler, Inhale 2 puffs into the lungs every 6 (six) hours as needed for wheezing or shortness of breath., Disp: 1 Inhaler, Rfl: 0 .  alendronate (FOSAMAX) 70 MG tablet, Take 70 mg by mouth once a week. Take with a full glass of water on an empty stomach., Disp: , Rfl:  .  Calcium Citrate-Vitamin D (CALCIUM + D PO), Take 600 mg by mouth daily., Disp: , Rfl:  .  CINNAMON PO, Take 1,000 mg by mouth daily., Disp: , Rfl:  .  conjugated estrogens (PREMARIN) vaginal cream, Place 1 Applicatorful vaginally 2 (two) times a week. Reported on 05/11/2015, Disp: 320 g, Rfl: 2 .  hydroxychloroquine (PLAQUENIL) 200 MG tablet, Take by mouth. Take 2 tablets once a day, Disp: , Rfl:  .  L-Lysine 500 MG TABS, Take 500 mg by mouth daily., Disp: , Rfl:  .  lansoprazole (PREVACID) 15 MG capsule, Take 15 mg by mouth daily at 12 noon., Disp: , Rfl:  .  Magnesium 250 MG TABS, Take by mouth daily., Disp: , Rfl:  .  Multiple Vitamin (MULTIVITAMIN) tablet, Take 1 tablet by mouth daily., Disp: , Rfl:  .  Omega-3 Fatty Acids (FISH OIL) 1000 MG CAPS, Take 1,000 mg by mouth daily., Disp: , Rfl:  .  predniSONE (DELTASONE) 5 MG tablet, Take 5 mg by mouth daily with breakfast., Disp: , Rfl:  .  sertraline (ZOLOFT) 50 MG  tablet, Take 1 tablet (50 mg total) by mouth daily. Take one-half of a pill by mouth daily for 6-8 days, then one whole pill daily, Disp: 90 tablet, Rfl: 3  Past Medical History: Past Medical History:  Diagnosis Date  . Arthritis    RA  . Pulmonary fibrosis ([place])   . 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 Never Smoker  Smokeless Tobacco  . Never Used    Labs: Recent Review Flowsheet Data    Labs for ITP Cardiac and Pulmonary Rehab Latest Ref Rng & Units 07/04/2015   Cholestrol 100 - 199 mg/dL 198   LDLCALC 0 - 99 mg/dL 91   HDL >39 mg/dL 79   Trlycerides 0 - 149 mg/dL 142       ADL UCSD:     Pulmonary Assessment Scores    Row Name 02/06/16 1555 03/26/16 0628 05/04/16 1221     ADL UCSD   ADL Phase Entry Mid Exit   SOB Score total 46 51 64   Rest 0 0 0   Walk _0 Stairs _1 Bath _2 Dress _3 Shop _4 Pulmonary Function Assessment:     Pulmonary Function Assessment - 02/06/16 1553  Pulmonary Function Tests   FVC% 80 %   FEV1% 98 %   FEV1/FVC Ratio 93.74   RV% 28 %   DLCO% 61 %     Initial Spirometry Results   Comments Spirometry 02/06/16; DLCO and TLC 01/03/16     Breath   Bilateral Breath Sounds Decreased;Rales;Basilar   Shortness of Breath Yes;Limiting activity      Exercise Target Goals:    Exercise Program Goal: Individual exercise prescription set with THRR, safety & activity barriers. Participant demonstrates ability to understand and report RPE using BORG scale, to self-measure pulse accurately, and to acknowledge the importance of the exercise prescription.  Exercise Prescription Goal: Starting with aerobic activity 30 plus minutes a day, 3 days per week for initial exercise prescription. Provide home exercise prescription and guidelines that participant acknowledges understanding prior to discharge.  Activity Barriers & Risk Stratification:     Activity Barriers & Cardiac Risk  Stratification - 02/06/16 1552      Activity Barriers & Cardiac Risk Stratification   Activity Barriers Arthritis;Shortness of Breath;Deconditioning   Cardiac Risk Stratification Moderate      6 Minute Walk:     6 Minute Walk    Row Name 02/06/16 1556 03/21/16 1105 05/02/16 1201     6 Minute Walk   Phase Initial Mid Program Discharge   Distance 1275 feet 1525 feet 1488 feet   Distance % Change  - 16.4 %  -   Walk Time 4.97 minutes 6 minutes 6 minutes   # of Rest Breaks 6  For oxygen desaturation: 7 sec, 6 sec, 10 sec, 11 sec, 12 sec, 16 sec 0 0   MPH 2.92 2.9 2.8   METS 2.98 2.9 3.6   RPE _0 Perceived Dyspnea  4  - 4   VO2 Peak 10.44 13.47 12.68   Symptoms No No No   Resting HR 62 bpm 81 bpm 96 bpm   Resting BP 116/70 104/64 126/64   Max Ex. HR 95 bpm 128 bpm 124 bpm   Max Ex. BP 126/64 142/62 116/50   2 Minute Post BP 126/60  -  -     Interval HR   Baseline HR 62 81 96   1 Minute HR 70 104 115   2 Minute HR 90 115 118   3 Minute HR 93 119 122   4 Minute HR 95 119 123   5 Minute HR 90 118 124   6 Minute HR 88 128 122   2 Minute Post HR 62 93  -   Interval Heart Rate? Yes Yes Yes     Interval Oxygen   Interval Oxygen? Yes  - Yes   Baseline Oxygen Saturation % 99 % 95 % 93 %   Baseline Liters of Oxygen 0 L  Room Air  - 2 L   1 Minute Oxygen Saturation % 78 %  quick recovery to 94%  95 % 94 %   1 Minute Liters of Oxygen 0 L  - 2 L   2 Minute Oxygen Saturation % 80 % 90 % 93 %   2 Minute Liters of Oxygen 0 L  - 2 L   3 Minute Oxygen Saturation % 91 %  80% at 2:44 87 % 92 %   3 Minute Liters of Oxygen 0 L  - 2 L   4 Minute Oxygen Saturation % 95 %  77% at 3:34 89 % 91 %   4 Minute Liters  of Oxygen 0 L  - 2 L   5 Minute Oxygen Saturation % 92 %  79% at 4:27 89 % 90 %   5 Minute Liters of Oxygen 0 L  - 2 L   6 Minute Oxygen Saturation % 85 %  81% at 5:14 85 % 92 %   6 Minute Liters of Oxygen 0 L  - 2 L   2 Minute Post Oxygen Saturation % 100 % 91 %   -   2 Minute Post Liters of Oxygen 0 L  - 2 L      Initial Exercise Prescription:     Initial Exercise Prescription - 02/06/16 1500      Date of Initial Exercise RX and Referring Provider   Date 02/06/16   Referring Provider Wallene Huh MD     Treadmill   MPH 2.6   Grade 0.5   Minutes 15  2 min intervals (oxygen saturation dependent)   METs 3.71     Elliptical   Level 1   Speed 2.6   Minutes 15     T5 Nustep   Level 2   Watts --  60-80 spm   Minutes 15   METs 2     Prescription Details   Frequency (times per week) 3   Duration Progress to 45 minutes of aerobic exercise without signs/symptoms of physical distress     Intensity   THRR 40-80% of Max Heartrate 98-133   Ratings of Perceived Exertion 11-13   Perceived Dyspnea 0-4     Progression   Progression Continue to progress workloads to maintain intensity without signs/symptoms of physical distress.     Resistance Training   Training Prescription Yes   Weight 2 lbs   Reps 10-12      Perform Capillary Blood Glucose checks as needed.  Exercise Prescription Changes:     Exercise Prescription Changes    Row Name 02/13/16 1400 02/22/16 1200 03/01/16 1200 03/15/16 1000 03/26/16 1500     Exercise Review   Progression  -  - Yes Yes Yes     Response to Exercise   Blood Pressure (Admit) 100/60  - 110/58 138/62 102/56   Blood Pressure (Exercise) 130/70  - 128/64 124/56 120/58   Blood Pressure (Exit) 107/60  - 124/60 114/63 106/64   Heart Rate (Admit) 63 bpm  - 74 bpm 82 bpm 93 bpm   Heart Rate (Exercise) 126 bpm  - 132 bpm 125 bpm 133 bpm   Heart Rate (Exit) 73 bpm  - 76 bpm 89 bpm 83 bpm   Oxygen Saturation (Admit) 98 %  - 96 % 99 % 94 %   Oxygen Saturation (Exercise) 91 %  - 89 % 90 % 91 %   Oxygen Saturation (Exit) 98 %  - 99 % 96 % 97 %   Rating of Perceived Exertion (Exercise) 13  - _0 Perceived Dyspnea (Exercise) 4  - _1 Symptoms none  - one none none   Duration Progress to 30  minutes of continuous aerobic without signs/symptoms of physical distress  - Progress to 45 minutes of aerobic exercise without signs/symptoms of physical distress Progress to 45 minutes of aerobic exercise without signs/symptoms of physical distress  -   Intensity THRR unchanged  98-133  - THRR unchanged THRR unchanged  -     Progression   Progression  -  -  - Continue to progress workloads to maintain intensity without  signs/symptoms of physical distress.  -     Resistance Training   Training Prescription Yes  - Yes Yes Yes   Weight 2 lbs  - _0 Reps 10-12  - 10-15 10-12 10-12     Interval Training   Interval Training  -  - No No No     Treadmill   MPH 2.6  - 2.8 2.8 2.8   Grade 0.5  - 0.5 0.5 1.5   Minutes 15  2 min intervals (oxygen saturation dependent)  - _1 METs 3.71  - 3.34 3.34 3.72     Elliptical   Level 1  - _2 Speed 2.6  - 2.6 2.5 2.5   Minutes 15  - _3 T5 Nustep   Level 2  - _4 Watts -  60-80 spm  -  -  -  -   Minutes 15  - _5 METs 2  - 2  -  -     Home Exercise Plan   Plans to continue exercise at  - [place] 2 additional days to program exercise sessions.  [employer]ne[employer] classes  -  -  -   Row Name 03/30/16 0800 04/10/16 1400 04/25/16 1200 05/08/16 0600 05/10/16 1100     Exercise Review   Progression _6      Response to Exercise   Blood Pressure (Admit)  - 110/62 100/60 112/62  Exercise goals and vital signs for 05/07/16 118/60   Blood Pressure (Exercise)  - 138/70 132/64 138/68 138/70   Blood Pressure (Exit)  - 104/72 92/52 102/52 132/56   Heart Rate (Admit)  - 83 bpm 75 bpm 84 bpm 70 bpm   Heart Rate (Exercise)  - 134 bpm 110 bpm 106 bpm 108 bpm   Heart Rate (Exit)  - 99 bpm 72 bpm 77 bpm 82 bpm   Oxygen Saturation (Admit)  - 95 % 99 % 97 % 97 %   Oxygen Saturation (Exercise)  - 84 % 93 % 96 % 92 %   Oxygen Saturation (Exit)  - 99 % 99 % 100 % 98 %    Rating of Perceived Exertion (Exercise)  - _7 Perceived Dyspnea (Exercise)  - _8 Duration  - Progress to 45 minutes of aerobic exercise without signs/symptoms of physical distress Progress to 45 minutes of aerobic exercise without signs/symptoms of physical distress Progress to 45 minutes of aerobic exercise without signs/symptoms of physical distress Progress to 45 minutes of aerobic exercise without signs/symptoms of physical distress   Intensity  - THRR unchanged THRR unchanged THRR unchanged THRR unchanged     Progression   Progression  - Continue to progress workloads to maintain intensity without signs/symptoms of physical distress. Continue to progress workloads to maintain intensity without signs/symptoms of physical distress. Continue to progress workloads to maintain intensity without signs/symptoms of physical distress. Continue to progress workloads to maintain intensity without signs/symptoms of physical distress.     Resistance Training   Training Prescription  - Yes Yes Yes Yes   Weight  - _9 Reps  - 10-12 10-12 10-12 10-12     Interval Training   Interval Training  - No No No  No     Oxygen   Oxygen  -  - Continuous Continuous Continuous   Liters  -  - 2  TM only 2  TM only 2     Treadmill   MPH  - 2.8 2.8 2.8 2.8   Grade  - 0 1.5 1.5 1.5   Minutes  - _0 METs  - 3.14 3.72 3.72 3.72     Recumbant Elliptical   Level  -  -  - 3 3   RPM  -  -  - 40 40   Minutes  -  -  - 15 15   METs  -  -  - 1.9  -     Elliptical   Level  - 1  -  -  -   Speed  - 2.6  -  -  -   Minutes  - 13  -  -  -     T5 Nustep   Level  -  -  - 3 3   Watts  -  -  - 30  -   Minutes  -  -  - 15 15   METs  -  -  - 2 2     Home Exercise Plan   Plans to continue exercise at  -  - [employer] (comment) Forensic scientist (comment)  -   Frequency  -  - Add 2 additional days to program exercise sessions.  T/[employer] Add 2 additional days to  program exercise sessions.  T/Th [employer]neakers  -      Exercise Comments:     Exercise Comments    Row Name 02/06/16 1602 02/13/16 1424 02/15/16 1232 03/01/16 1238 03/14/16 1252   Exercise Comments Marland Mcalpine wants to be able to clean the house without getting overly tired First full day of exercise!  Patient was oriented to gym and equipment including functions, settings, policies, and procedures.  Patient's individual exercise prescription and treatment plan were reviewed.  All starting workloads were established based on the results of the 6 minute walk test done at initial orientation visit.  The plan for exercise progression was also introduced and progression will be customized based on patient's performance and goals Kannon has done well in her first week of exercise. Mailin is progressing well with exercise. Plan is to have [facility] use 2L O2 during exercise as she has dropped below 90 during a session.   [place] Name 03/26/16 1511 04/10/16 1459 04/25/16 1224 05/10/16 1203 05/11/16 1218   Exercise Comments Adyn has increased her time on the elliptical to 12 min and incline on TM to 1.5. Lety is doing well with exercise.  She was not able to wear oxygen during last session due to a cold sore, but otherwise has increased time or resistance on machines. Barbaras oxygen levels have stayed within normal limits since adding 2L to TM and/or Elliptical.   Pamala Hurry graduates next session and plansto continue with the [facility] classes and independent gym. Ruari graduated today from cardiac rehab with 36 sessions completed.  Details of the patient's exercise prescription and what she needs to do in order to continue the prescription and progress were discussed with patient.  Patient was given a copy of prescription and goals.  Patient verbalized understanding.  Kirstina plans to continue to exercise by  Coming to silver sneakers program here at [facility] rehab facility and has signed up for a jazzercise class at  the [facility].  She states she also has some videos at home, along with her treadmill that she plans to use on days she doesn't have a class to go to.        Discharge Exercise Prescription (Final Exercise Prescription Changes):     Exercise Prescription Changes - 05/10/16 1100      Exercise Review   Progression Yes     Response to Exercise   Blood Pressure (Admit) 118/60   Blood Pressure (Exercise) 138/70   Blood Pressure (Exit) 132/56   Heart Rate (Admit) 70 bpm   Heart Rate (Exercise) 108 bpm   Heart Rate (Exit) 82 bpm   Oxygen Saturation (Admit) 97 %   Oxygen Saturation (Exercise) 92 %   Oxygen Saturation (Exit) 98 %   Rating of Perceived Exertion (Exercise) 12   Perceived Dyspnea (Exercise) 3   Duration Progress to 45 minutes of aerobic exercise without signs/symptoms of physical distress   Intensity THRR unchanged     Progression   Progression Continue to progress workloads to maintain intensity without signs/symptoms of physical distress.     Resistance Training   Training Prescription Yes   Weight 3   Reps 10-12     Interval Training   Interval Training No     Oxygen   Oxygen Continuous   Liters 2     Treadmill   MPH 2.8   Grade 1.5   Minutes 15   METs 3.72     Recumbant Elliptical   Level 3   RPM 40   Minutes 15     T5 Nustep   Level 3   Minutes 15   METs 2       Nutrition:  Target Goals: Understanding of nutrition guidelines, daily intake of sodium <1541m, cholesterol <2060m calories 30% from fat and 7% or less from saturated fats, daily to have 5 or more servings of fruits and vegetables.  Biometrics:     Pre Biometrics - 02/06/16 1602      Pre Biometrics   Height 5' 4.7" (1.643 m)   Weight 141 lb 11.2 oz (64.3 kg)   Waist Circumference 33.25 inches   Hip Circumference 37.5 inches   Waist to Hip Ratio 0.89 %   BMI (Calculated) 23.8         Post Biometrics - 05/02/16 1200       Post  Biometrics   Height 5'  4.7" (1.643 m)   Weight 141 lb 14.4 oz (64.4 kg)   Waist Circumference 33.5 inches   Hip Circumference 39.25 inches   Waist to Hip Ratio 0.85 %   BMI (Calculated) 23.9      Nutrition Therapy Plan and Nutrition Goals:   Nutrition Discharge: Rate Your Plate Scores:   Psychosocial: Target Goals: Acknowledge presence or absence of depression, maximize coping skills, provide positive support system. Participant is able to verbalize types and ability to use techniques and skills needed for reducing stress and depression.  Initial Review & Psychosocial Screening:     Initial Psych Review & Screening - 02/06/16 [id number]   Comments Ms FuMofields very positive about dealing with her Pulmonary Fibrosis and has support from her children and husband. [place] [facility]    Barriers   Psychosocial barriers to participate in program The patient should benefit from training in stress management and relaxation.     Screening Interventions  Interventions Encouraged to exercise      Quality of Life Scores:     Quality of Life - 05/04/16 1222      Quality of Life Scores   Health/Function Pre 24.63 %   Health/Function Post 18.63 %   Health/Function % Change -24.36 %   Socioeconomic Pre 21 %   Socioeconomic Post 29.64 %   Socioeconomic % Change  41.14 %   Psych/Spiritual Pre 21 %   Psych/Spiritual Post 27.43 %   Psych/Spiritual % Change 30.62 %   Family Pre 21 %   Family Post 30 %   Family % Change 42.86 %   GLOBAL Pre 22.66 %   GLOBAL Post 24.21 %   GLOBAL % Change 6.84 %      PHQ-9: Recent Review Flowsheet Data    Depression screen [facility] 2/9 05/04/2016 02/06/2016 12/13/2015 07/04/2015 06/08/2015   Decreased Interest 0 0 0 0 0   Down, Depressed, Hopeless 0 0 0 0 0   PHQ - 2 Score 0 0 0 0 0   Altered sleeping 1 1 - - -   Tired, decreased energy 2 3 - - -   Change in appetite 0 0 - - -   Feeling bad or failure about  yourself  0 0 - - -   Trouble concentrating 0 0 - - -   Moving slowly or fidgety/restless 0 0 - - -   Suicidal thoughts 0 0 - - -   PHQ-9 Score 3 4 - - -   Difficult doing work/chores Somewhat difficult Somewhat difficult - - -      Psychosocial Evaluation and Intervention:     Psychosocial Evaluation - 02/13/16 1055      Psychosocial Evaluation & Interventions   Interventions Encouraged to exercise with the program and follow exercise prescription   Comments Counselor met with Ms. Shipton today for initial psychosocial evaluation.  She is a 70 year old who has Pulmonary Fibrosis.  She has a strong support system with a spouse of 3 years; an adult daughter and friends who live close by and she is also actively involved in her local church.  Ms. Cassel states her health is otherwise pretty good and she sleeps well and has a good appetite.  She reports a history of depression almost a year ago when her mother had some increased health issues.  Ms. Gerre Pebbles Dr. put her on Sertraline (50 mg) during that time and through her mother's death this past 10-09-22, she has remained on this medication; which she reports "is working well" for her.  She states her mood is typically positive and she has minimal stress in her life - other than her health limiting her ability to do simple household chores/responsibilities.  Her goals for this program are to breathe better; to avoid oxygen; and to increase her stamina.  She will return to her [employer] class upon completion of this program.  Counselor will continue to follow with her throughout the course of Pulmonary Rehab.      Psychosocial Re-Evaluation:     Psychosocial Re-Evaluation    [place] Name 03/05/16 1024 03/21/16 1122 04/18/16 1130         Psychosocial Re-Evaluation   Comments Follow up with Ms. Shultis today reporting making some progress since came into this class.  She is noticing getting in and out of her car and up and down with greater ease.  She  reports her mood is consistently positive and she is  coping better with pacing herself in getting things done around the house; although she is able to do more now and that is positive as well.  Ms. Wanda Plump states she has enjoyed the educational parts of this program as well.  Counselor commended Ms. F on her hard work and progress made since coming into this program.   Follow up with Ms Schraeder today stating she is continuing to notice improvement in her core strength with getting up and down.  She states her flexibility has improved as well with ease in turning around and side to side movements.  She plans to do Silver Sneakers following this program.  Her mood and sleep continue to be stable.  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  counselor offered support and encouragement and reminded of stress management techniques to practice during that stressful time especially.   Follow up with Ms. [place] today stating she went to the beach for [place] with family and was feeling stronger and better.  She continues to experience increase in core strength with her legs particularly feeling stronger.  Her sleep and appetite remain stable and she is handling the stress of job demands better by exercise and improved self-care.  She plans to continue exercising at [employer] at [facility] upon discharge and she has a treadmill and videos she is committed to using more frequently now that she has experienced such progress.  Counselor commended her on her progress and commitment to healthier living.         Education: Education Goals: Education classes will be provided on a weekly basis, covering required topics. Participant will state understanding/return demonstration of topics presented.  Learning Barriers/Preferences:   Education Topics: Initial Evaluation Education: - Verbal, written and demonstration of respiratory meds,  RPE/PD scales, oximetry and breathing techniques. Instruction on use of nebulizers and MDIs: cleaning and proper use, rinsing mouth with steroid doses and importance of monitoring MDI activations. Flowsheet Row Pulmonary Rehab from 05/09/2016 in [facility] Cardiac and Pulmonary Rehab  Date  02/06/16  Educator  LB  Instruction Review Code  2- meets goals/outcomes      General Nutrition Guidelines/Fats and Fiber: -Group instruction provided by verbal, written material, models and posters to present the general guidelines for heart healthy nutrition. Gives an explanation and review of dietary fats and fiber. Flowsheet Row Pulmonary Rehab from 05/09/2016 in [facility] Cardiac and Pulmonary Rehab  Date  04/09/16  Educator  CR  Instruction Review Code  2- meets goals/outcomes      Controlling Sodium/Reading Food Labels: -Group verbal and written material supporting the discussion of sodium use in heart healthy nutrition. Review and explanation with models, verbal and written materials for utilization of the food label. Flowsheet Row Pulmonary Rehab from 05/09/2016 in [facility] Cardiac and Pulmonary Rehab  Date  04/16/16  Educator  CR  Instruction Review Code  2- meets goals/outcomes      Exercise Physiology & Risk Factors: - Group verbal and written instruction with models to review the exercise physiology of the cardiovascular system and associated critical values. Details cardiovascular disease risk factors and the goals associated with each risk factor. Flowsheet Row Pulmonary Rehab from 05/09/2016 in [facility] Cardiac and Pulmonary Rehab  Date  04/04/16  Educator  AS  Instruction Review Code  2- meets goals/outcomes      Aerobic Exercise & Resistance Training: - Gives group verbal and  written discussion on the health impact of inactivity. On the components of aerobic and resistive training programs and the benefits of this training and how to safely progress through these programs.   Flexibility,  Balance, General Exercise Guidelines: - Provides group verbal and written instruction on the benefits of flexibility and balance training programs. Provides general exercise guidelines with specific guidelines to those with heart or lung disease. Demonstration and skill practice provided. Flowsheet Row Pulmonary Rehab from 05/09/2016 in [facility] Cardiac and Pulmonary Rehab  Date  05/09/16  Educator  JH/AS  Instruction Review Code  2- meets goals/outcomes      Stress Management: - Provides group verbal and written instruction about the health risks of elevated stress, cause of high stress, and healthy ways to reduce stress.   Depression: - Provides group verbal and written instruction on the correlation between heart/lung disease and depressed mood, treatment options, and the stigmas associated with seeking treatment. Flowsheet Row Pulmonary Rehab from 05/09/2016 in [facility] Cardiac and Pulmonary Rehab  Date  04/23/16  Educator  [facility]  Instruction Review Code  2- meets goals/outcomes      Exercise & Equipment Safety: - Individual verbal instruction and demonstration of equipment use and safety with use of the equipment. Flowsheet Row Pulmonary Rehab from 05/09/2016 in [facility] Cardiac and Pulmonary Rehab  Date  02/13/16  Educator  AS  Instruction Review Code  2- meets goals/outcomes      Infection Prevention: - Provides verbal and written material to individual with discussion of infection control including proper hand washing and proper equipment cleaning during exercise session. Flowsheet Row Pulmonary Rehab from 05/09/2016 in [facility] Cardiac and Pulmonary Rehab  Date  02/13/16  Educator  AS  Instruction Review Code  2- meets goals/outcomes      Falls Prevention: - Provides verbal and written material to individual with discussion of falls prevention and safety. Flowsheet Row Pulmonary Rehab from 05/09/2016 in [facility] Cardiac and Pulmonary Rehab  Date  02/06/16  Educator  LB  Instruction  Review Code  2- meets goals/outcomes      Diabetes: - Individual verbal and written instruction to review signs/symptoms of diabetes, desired ranges of glucose level fasting, after meals and with exercise. Advice that pre and post exercise glucose checks will be done for 3 sessions at entry of program.   Chronic Lung Diseases: - Group verbal and written instruction to review new updates, new respiratory medications, new advancements in procedures and treatments. Provide informative websites and "800" numbers of self-education. Flowsheet Row Pulmonary Rehab from 05/09/2016 in [facility] Cardiac and Pulmonary Rehab  Date  03/05/16  Educator  LB  Instruction Review Code  2- meets goals/outcomes      Lung Procedures: - Group verbal and written instruction to describe testing methods done to diagnose lung disease. Review the outcome of test results. Describe the treatment choices: Pulmonary Function Tests, ABGs and oximetry.   Energy Conservation: - Provide group verbal and written instruction for methods to conserve energy, plan and organize activities. Instruct on pacing techniques, use of adaptive equipment and posture/positioning to relieve shortness of breath. Flowsheet Row Pulmonary Rehab from 05/09/2016 in [facility] Cardiac and Pulmonary Rehab  Date  02/29/16  Educator  Fredderick Erb, EP  Instruction Review Code  2- meets goals/outcomes      Triggers: - Group verbal and written instruction to review types of environmental controls: home humidity, furnaces, filters, dust mite/pet prevention, HEPA vacuums. To discuss weather changes, air quality and the benefits of nasal washing.  Exacerbations: - Group verbal and written instruction to provide: warning signs, infection symptoms, calling MD promptly, preventive modes, and value of vaccinations. Review: effective airway clearance, coughing and/or vibration techniques. Create an Sports administrator. Flowsheet Row Pulmonary Rehab from 05/09/2016 in [facility]  Cardiac and Pulmonary Rehab  Date  04/25/16  Educator  LB  Instruction Review Code  2- meets goals/outcomes      Oxygen: - Individual and group verbal and written instruction on oxygen therapy. Includes supplement oxygen, available portable oxygen systems, continuous and intermittent flow rates, oxygen safety, concentrators, and Medicare reimbursement for oxygen.   Respiratory Medications: - Group verbal and written instruction to review medications for lung disease. Drug class, frequency, complications, importance of spacers, rinsing mouth after steroid MDI's, and proper cleaning methods for nebulizers.   AED/CPR: - Group verbal and written instruction with the use of models to demonstrate the basic use of the AED with the basic ABC's of resuscitation. Flowsheet Row Pulmonary Rehab from 05/09/2016 in [facility] Cardiac and Pulmonary Rehab  Date  03/30/16  Educator  C. EnterkinRN  Instruction Review Code  2- meets goals/outcomes      Breathing Retraining: - Provides individuals verbal and written instruction on purpose, frequency, and proper technique of diaphragmatic breathing and pursed-lipped breathing. Applies individual practice skills. Flowsheet Row Pulmonary Rehab from 05/09/2016 in [facility] Cardiac and Pulmonary Rehab  Date  02/13/16  Educator  AS  Instruction Review Code  2- meets goals/outcomes      Anatomy and Physiology of the Lungs: - Group verbal and written instruction with the use of models to provide basic lung anatomy and physiology related to function, structure and complications of lung disease. Flowsheet Row Pulmonary Rehab from 05/09/2016 in [facility] Cardiac and Pulmonary Rehab  Date  04/18/16  Educator  LB  Instruction Review Code  2- meets goals/outcomes      Heart Failure: - Group verbal and written instruction on the basics of heart failure: signs/symptoms, treatments, explanation of ejection fraction, enlarged heart and cardiomyopathy. Flowsheet Row Pulmonary  Rehab from 05/09/2016 in [facility] Cardiac and Pulmonary Rehab  Date  05/04/16  Educator  SB  Instruction Review Code  2- meets goals/outcomes      Sleep Apnea: - Individual verbal and written instruction to review Obstructive Sleep Apnea. Review of risk factors, methods for diagnosing and types of masks and machines for OSA.   Anxiety: - Provides group, verbal and written instruction on the correlation between heart/lung disease and anxiety, treatment options, and management of anxiety. Flowsheet Row Pulmonary Rehab from 05/09/2016 in [facility] Cardiac and Pulmonary Rehab  Date  02/22/16  Educator  [facility]  Instruction Review Code  2- Meets goals/outcomes      Relaxation: - Provides group, verbal and written instruction about the benefits of relaxation for patients with heart/lung disease. Also provides patients with examples of relaxation techniques. Flowsheet Row Pulmonary Rehab from 05/09/2016 in [facility] Cardiac and Pulmonary Rehab  Date  03/21/16  Educator  [facility]  Instruction Review Code  2- Meets goals/outcomes      Knowledge Questionnaire Score:     Knowledge Questionnaire Score - 05/04/16 1222      Knowledge Questionnaire Score   Pre Score 9/10   Post Score 10/10       Core Components/Risk Factors/Patient Goals at Admission:     Personal Goals and Risk Factors at Admission - 02/06/16 1558      Core Components/Risk Factors/Patient Goals on Admission    Weight Management Yes   Intervention Weight  Management: Develop a combined nutrition and exercise program designed to reach desired caloric intake, while maintaining appropriate intake of nutrient and fiber, sodium and fats, and appropriate energy expenditure required for the weight goal.;Weight Management: Provide education and appropriate resources to help participant work on and attain dietary goals.   Admit Weight 141 lb 11.2 oz (64.3 kg)   Goal Weight: Short Term 138 lb (62.6 kg)   Goal Weight: Long Term 125 lb (56.7 kg)    Expected Outcomes Short Term: Continue to assess and modify interventions until short term weight is achieved;Long Term: Adherence to nutrition and physical activity/exercise program aimed toward attainment of established weight goal;Weight Maintenance: Understanding of the daily nutrition guidelines, which includes 25-35% calories from fat, 7% or less cal from saturated fats, less than 214m cholesterol, less than 1.5gm of sodium, & 5 or more servings of fruits and vegetables daily   Sedentary Yes   Intervention Provide advice, education, support and counseling about physical activity/exercise needs.;Develop an individualized exercise prescription for aerobic and resistive training based on initial evaluation findings, risk stratification, comorbidities and participant's personal goals.  Home Treadmill and Silver Sneaker Classes   Expected Outcomes Achievement of increased cardiorespiratory fitness and enhanced flexibility, muscular endurance and strength shown through measurements of functional capacity and personal statement of participant.   Increase Strength and Stamina Yes   Intervention Provide advice, education, support and counseling about physical activity/exercise needs.;Develop an individualized exercise prescription for aerobic and resistive training based on initial evaluation findings, risk stratification, comorbidities and participant's personal goals.   Expected Outcomes Achievement of increased cardiorespiratory fitness and enhanced flexibility, muscular endurance and strength shown through measurements of functional capacity and personal statement of participant.   Improve shortness of breath with ADL's Yes   Intervention Provide education, individualized exercise plan and daily activity instruction to help decrease symptoms of SOB with activities of daily living.  Complete housework with less shortness of breath   Expected Outcomes Short Term: Achieves a reduction of symptoms when  performing activities of daily living.   Develop more efficient breathing techniques such as purse lipped breathing and diaphragmatic breathing; and practicing self-pacing with activity Yes   Intervention Provide education, demonstration and support about specific breathing techniuqes utilized for more efficient breathing. Include techniques such as pursed lipped breathing, diaphragmatic breathing and self-pacing activity.   Expected Outcomes Short Term: Participant will be able to demonstrate and use breathing techniques as needed throughout daily activities.      Core Components/Risk Factors/Patient Goals Review:      Goals and Risk Factor Review    Row Name 02/13/16 1419 02/20/16 1456 02/27/16 0922 02/27/16 0932 03/26/16 0616     Core Components/Risk Factors/Patient Goals Review   Personal Goals Review Develop more efficient breathing techniques such as purse lipped breathing and diaphragmatic breathing and practicing self-pacing with activity. Develop more efficient breathing techniques such as purse lipped breathing and diaphragmatic breathing and practicing self-pacing with activity.;Other Sedentary;Improve shortness of breath with ADL's  - Increase knowledge of respiratory medications and ability to use respiratory devices properly.;Develop more efficient breathing techniques such as purse lipped breathing and diaphragmatic breathing and practicing self-pacing with activity.;Improve shortness of breath with ADL's;Increase Strength and Stamina;Sedentary;Weight Management/Obesity   Review Pursed lip breathing techniques were discussed with the patient. He patient demonstrated understanding of these techniques.  Ms FLononwas cued to perform PLB after her EL exercise. She had increased her time to 12ms from set work and benefited from the PLB to recover  from her shortness of breath. Also I gave her a copy of the Pulmonary Fibrosis Handbook from the [employer] Association.  Unfortunately she does not  drive at night and can not attend the PF Support Group. Ms Bebee states she has noticed an improvement in certain activities, such as getting up from a chair, vaccuming, and getting out of her car. She has performed PLB for mahy years and knows the benefit of the technique. She plans to continue at [facility] and add one day of the [employer] class. Ms Amato states she has noticed an improvement in certain activities, such as getting up from a chair, vaccuming, and getting out of her car. She has performed PLB for many years and knows the benefit of the technique. She plans to continue at [facility] and add one day of the [employer] class. Ms Heather is progressing with her exercise goals and continues with her activity at home. She is checking her O2Sat's with her oximeter and stops her activity, such as dusting or making the bed, when her shortness of breaath increases and O2Sat's are low. During exercise on the TM and EL, we have been using 2l/m of oxygen to improve O2Sat's and work of breathing. Ms Moroz has improved mid688md by 25104f She has also lost 2 pounds - her goal is 10lbs. I recently gave her the 2017 Portable Concentrator Guide, so Ms FuBerghuisan prepare for portable oxygen therapy.    Expected Outcomes Patient will use pursed lip breathing during exercise and daily activities to control shortness of breath.   -  -  -  -   Row Name 04/11/16 1328 04/23/16 1521 05/02/16 1503 05/08/16 0607       Core Components/Risk Factors/Patient Goals Review   Personal Goals Review Increase knowledge of respiratory medications and ability to use respiratory devices properly. Increase knowledge of respiratory medications and ability to use respiratory devices properly.;Improve shortness of breath with ADL's;Develop more efficient breathing techniques such as purse lipped breathing and diaphragmatic breathing and practicing self-pacing with activity.;Sedentary;Increase Strength and Stamina Increase knowledge  of respiratory medications and ability to use respiratory devices properly. Sedentary;Increase Strength and Stamina;Improve shortness of breath with ADL's;Increase knowledge of respiratory medications and ability to use respiratory devices properly.;Develop more efficient breathing techniques such as purse lipped breathing and diaphragmatic breathing and practicing self-pacing with activity.    Review Reviewed portable concentrators and have decided to use our concentrator for Ms Gartman's exercise goals. She can assess the difference between intermittent and continuous and this will help her make a decision on which concentrator the buy. Ms FuHester Matesill be graduating in 8 sessions. She states she has improved with her strength with housework, but has increased shortness of breath and is now ready to commit to portable oxygen. Ms FuHester Matesas been using oxygen at 2l/m with her treadmill goals . She has dropped to mid 80"s % saturation on the TM and during her mid 88m69m During her appointment with Dr FleRaul Del 05/01/16, she plans to discuss adding oxygen to her therapy due to the benefits during activity. Ms FugHester Mateses perform PLB with good technique. Ms FugHester Matesd her appointment with Dr FleRaul Del/12/17. She now has oxygen ordered at 2l/m and will use Inogene. During her 6 minute walk today , her O2Sat's did stay in the 90's with 2l/m of oxygen. I did review her PFT and educated her on her results, especially with the DLCO which relates to her pulmonary fibrosis. Dr FleRaul Deles want  her to start on Ofev which Ms Closson is willing take if tolerated. Ms Hester Mates will be graduating this week. She states she has really benefited from [place] - both the exercise and knowledge gained through education. Ms Boldon has improved her post 77md by 2194f Minimal Importance Difference for Pulmonary Fibrosis is 78.72-147.41f28fShe plans to continue her exercise at home on her treadmill, Silver Sneaker classes on Wednesdat and Thursday,  and Dance Fit class on Tuesday. Through monitoring O2Sat's, Ms FuqLamsons gained the knowledge of the importance of oxygen therapy with activity to keep her O2 saturations in the 90's and now has an oxygen order from Dr FleRaul Dels FuqWoodring planning to start the Ofev drug for Pulmonary Fibrosis.    Expected Outcomes  -  - Continue using oxygen for activity and Ofev for treatment of her pulmonary fibrosis. Continue self management of her Pulmonary Fibrosis with exercise, oxygen, education learned in [facility], and Ofev as tolerated.       Core Components/Risk Factors/Patient Goals at Discharge (Final Review):      Goals and Risk Factor Review - 05/08/16 0607      Core Components/Risk Factors/Patient Goals Review   Personal Goals Review Sedentary;Increase Strength and Stamina;Improve shortness of breath with ADL's;Increase knowledge of respiratory medications and ability to use respiratory devices properly.;Develop more efficient breathing techniques such as purse lipped breathing and diaphragmatic breathing and practicing self-pacing with activity.   Review Ms FugHester Matesll be graduating this week. She states she has really [place] and knowledge gained through education. Ms FuqTitzers improved her post [id number] 213ft10fnimal Importance Difference for Pulmonary Fibrosis is 78.72-147.41ft. 64f plans to continue her exercise at home on her treadmill, Silver Sneaker classes on Wednesdat and Thursday, and Dance Fit class on Tuesday. Through monitoring O2Sat's, Ms Mervine Tunisonained the knowledge of the importance of oxygen therapy with activity to keep her O2 saturations in the 90's and now has an oxygen order from Dr FleminRaul Deluqua Santmyeranning to start the Ofev drug for Pulmonary Fibrosis.   Expected Outcomes Continue self management of her Pulmonary Fibrosis with exercise, oxygen, education learned in [facility], and Ofev as tolerated.      ITP Comments:     ITP  Comments    Row Name 02/15/16 1233 04/20/16 1057         ITP Comments BarbarDonyelleone well in her first week of exercise. "Know Your Numbers" education was completed today with learning goals met.           Comments: discharge ITP

## 2016-05-11 NOTE — Progress Notes (Signed)
Daily Session Note  Patient Details  Name: Melanie Rollins MRN: [id number] Date of Birth: Nov 05, 1945 Referring Provider:   Flowsheet Row Pulmonary Rehab from 02/06/2016 in [facility] Cardiac and Pulmonary Rehab  Referring Provider  Wallene Huh MD      Encounter Date: 05/11/2016  Check In:     Session Check In - 05/11/16 1039      Check-In   Location [facility]-Cardiac & Pulmonary Rehab   Staff Present Gerlene Burdock, RN, Vickki Hearing, BA, ACSM CEP, Exercise Physiologist;Patricia Surles RN BSN   Supervising physician immediately available to respond to emergencies [facility] immediately available ER MD   Physician(s) Dr. Joni Fears and Dr. Reita Cliche   Medication changes reported     No   Fall or balance concerns reported    No   Warm-up and Cool-down Performed as group-led instruction   Resistance Training Performed Yes   VAD Patient? No     Pain Assessment   Currently in Pain? No/denies           Exercise Prescription Changes - 05/10/16 1100      Exercise Review   Progression Yes     Response to Exercise   Blood Pressure (Admit) 118/60   Blood Pressure (Exercise) 138/70   Blood Pressure (Exit) 132/56   Heart Rate (Admit) 70 bpm   Heart Rate (Exercise) 108 bpm   Heart Rate (Exit) 82 bpm   Oxygen Saturation (Admit) 97 %   Oxygen Saturation (Exercise) 92 %   Oxygen Saturation (Exit) 98 %   Rating of Perceived Exertion (Exercise) 12   Perceived Dyspnea (Exercise) 3   Duration Progress to 45 minutes of aerobic exercise without signs/symptoms of physical distress   Intensity THRR unchanged     Progression   Progression Continue to progress workloads to maintain intensity without signs/symptoms of physical distress.     Resistance Training   Training Prescription Yes   Weight 3   Reps 10-12     Interval Training   Interval Training No     Oxygen   Oxygen Continuous   Liters 2     Treadmill   MPH 2.8   Grade 1.5   Minutes 15   METs 3.72     Recumbant  Elliptical   Level 3   RPM 40   Minutes 15     T5 Nustep   Level 3   Minutes 15   METs 2      Goals Met:  Proper associated with RPD/PD & O2 Sat Exercise tolerated well  Goals Unmet:  Not Applicable  Comments:     Dr. Emily Filbert is Medical Director for [name] and [facility]monary [facility].

## 2016-05-22 ENCOUNTER — Other Ambulatory Visit: Payer: Self-pay | Admitting: Pulmonary Disease

## 2016-05-22 DIAGNOSIS — J8410 Pulmonary fibrosis, unspecified: Secondary | ICD-10-CM

## 2016-05-24 ENCOUNTER — Ambulatory Visit: Payer: Medicare Other

## 2016-05-26 DIAGNOSIS — J8410 Pulmonary fibrosis, unspecified: Secondary | ICD-10-CM | POA: Diagnosis not present

## 2016-05-26 DIAGNOSIS — J849 Interstitial pulmonary disease, unspecified: Secondary | ICD-10-CM | POA: Diagnosis not present

## 2016-05-27 ENCOUNTER — Other Ambulatory Visit: Payer: Self-pay | Admitting: Unknown Physician Specialty

## 2016-05-29 DIAGNOSIS — J8410 Pulmonary fibrosis, unspecified: Secondary | ICD-10-CM | POA: Diagnosis not present

## 2016-05-29 DIAGNOSIS — M359 Systemic involvement of connective tissue, unspecified: Secondary | ICD-10-CM | POA: Diagnosis not present

## 2016-06-20 DIAGNOSIS — Z961 Presence of intraocular lens: Secondary | ICD-10-CM | POA: Diagnosis not present

## 2016-07-06 ENCOUNTER — Other Ambulatory Visit: Payer: Self-pay

## 2016-07-06 ENCOUNTER — Encounter: Payer: Self-pay | Admitting: Unknown Physician Specialty

## 2016-07-06 ENCOUNTER — Ambulatory Visit (INDEPENDENT_AMBULATORY_CARE_PROVIDER_SITE_OTHER): Payer: Medicare Other | Admitting: Unknown Physician Specialty

## 2016-07-06 VITALS — BP 107/66 | HR 66 | Temp 98.2°F | Ht 63.5 in | Wt 137.0 lb

## 2016-07-06 DIAGNOSIS — F418 Other specified anxiety disorders: Secondary | ICD-10-CM | POA: Diagnosis not present

## 2016-07-06 DIAGNOSIS — F329 Major depressive disorder, single episode, unspecified: Secondary | ICD-10-CM

## 2016-07-06 DIAGNOSIS — Z1239 Encounter for other screening for malignant neoplasm of breast: Secondary | ICD-10-CM

## 2016-07-06 DIAGNOSIS — Z1231 Encounter for screening mammogram for malignant neoplasm of breast: Secondary | ICD-10-CM | POA: Diagnosis not present

## 2016-07-06 DIAGNOSIS — M8580 Other specified disorders of bone density and structure, unspecified site: Secondary | ICD-10-CM | POA: Diagnosis not present

## 2016-07-06 DIAGNOSIS — J8410 Pulmonary fibrosis, unspecified: Secondary | ICD-10-CM

## 2016-07-06 DIAGNOSIS — Z5181 Encounter for therapeutic drug level monitoring: Secondary | ICD-10-CM | POA: Diagnosis not present

## 2016-07-06 DIAGNOSIS — B009 Herpesviral infection, unspecified: Secondary | ICD-10-CM | POA: Diagnosis not present

## 2016-07-06 DIAGNOSIS — Z7189 Other specified counseling: Secondary | ICD-10-CM | POA: Insufficient documentation

## 2016-07-06 DIAGNOSIS — Z0000 Encounter for general adult medical examination without abnormal findings: Secondary | ICD-10-CM

## 2016-07-06 DIAGNOSIS — E782 Mixed hyperlipidemia: Secondary | ICD-10-CM

## 2016-07-06 DIAGNOSIS — E2839 Other primary ovarian failure: Secondary | ICD-10-CM

## 2016-07-06 DIAGNOSIS — F419 Anxiety disorder, unspecified: Secondary | ICD-10-CM

## 2016-07-06 MED ORDER — PENCICLOVIR 1 % EX CREA: 1.0000 "application " | TOPICAL_CREAM | CUTANEOUS | 0 refills | Status: DC

## 2016-07-06 MED ORDER — SERTRALINE HCL 50 MG PO TABS: 50.0000 mg | ORAL_TABLET | Freq: Every day | ORAL | 3 refills | Status: DC

## 2016-07-06 NOTE — Assessment & Plan Note (Signed)
Has HCPOA and Living Will.  Husband is her health care poxy.

## 2016-07-06 NOTE — Progress Notes (Signed)
BP 107/66 (BP Location: Left Arm, Patient Position: Sitting, Cuff Size: Small)   Pulse 66   Temp 98.2 F (36.8 C)   Ht 5' 3.5" (1.613 m)   Wt 137 lb (62.1 kg)   LMP  (LMP Unknown)   SpO2 95%   BMI 23.89 kg/m    Subjective:    Patient ID: Melanie Rollins, female    DOB: Apr 06, 1946, 72 y.o.   MRN: [id number]  HPI: Melanie Rollins is a 71 y.o. female  Chief Complaint  Patient presents with  . Medicare Wellness    pt states she is interested in Hep C screening    Functional Status Survey: Is the patient deaf or have difficulty hearing?: Yes Does the patient have difficulty seeing, even when wearing glasses/contacts?: Yes (pt states she has dry eyes) Does the patient have difficulty concentrating, remembering, or making decisions?: No Does the patient have difficulty walking or climbing stairs?: No Does the patient have difficulty dressing or bathing?: No Does the patient have difficulty doing errands alone such as visiting a doctor's office or shopping?: No  Fall Risk  07/06/2016 02/06/2016 07/04/2015 05/13/2015  Falls in the past year? 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  It was stable until a couple of weeks ago in which she got put on O2 and now monitored at [facility]  Taking Prednisone 14m.    Anxiety Taking Sertraline which is working well.  Rarely takes Clonazepam or Ambien.   [facility] 07/06/2016 05/04/2016 02/06/2016 12/13/2015 07/04/2015  Decreased Interest 0 0 0 0 0  Down, Depressed, Hopeless 0 0 0 0 0  PHQ - 2 Score 0 0 0 0 0  Altered sleeping _0 - -  Tired, decreased energy _1 - -  Change in appetite 0 0 0 - -  Feeling bad or failure about yourself  0 0 0 - -  Trouble concentrating 0 0 0 - -  Moving slowly or fidgety/restless 0 0 0 - -  Suicidal thoughts 0 0 0 - -  PHQ-9 Score _2 - -    Difficult doing work/chores - Somewhat difficult Somewhat difficult - -       Relevant past medical, surgical, family and social history reviewed and updated as indicated. Interim medical history since our last visit reviewed. Allergies and medications reviewed and updated.  Review of Systems  Constitutional: Negative.   HENT: Negative.   Eyes: Negative.   Respiratory: Negative.   Cardiovascular: Negative.   Gastrointestinal: Negative.   Endocrine: Negative.   Genitourinary: Negative.   Musculoskeletal: Negative.   Skin:       Small scab left ear lobe.  Cold sore above lip which is recurrent  Allergic/Immunologic: Negative.   Neurological: Negative.   Hematological: Negative.     Per HPI unless specifically indicated above     Objective:    BP 107/66 (BP Location: Left Arm, Patient Position: Sitting, Cuff Size: Small)   Pulse 66   Temp 98.2 F (36.8 C)   Ht 5' 3.5" (1.613 m)   Wt 137 lb (62.1 kg)   LMP  (LMP Unknown)   SpO2 95%   BMI 23.89 kg/m   Wt Readings from Last 3 Encounters:  07/06/16 137 lb (62.1 kg)  05/02/16 141 lb 14.4 oz (64.4 kg)  02/06/16 141 lb 11.2 oz (64.3 kg)    Physical Exam  Constitutional: She is oriented to person, place, and time. She appears well-developed and well-nourished.  HENT:  Head: Normocephalic and atraumatic.  Eyes: Pupils are equal, round, and reactive to light. Right eye exhibits no discharge. Left eye exhibits no discharge. No scleral icterus.  Neck: Normal range of motion. Neck supple. Carotid bruit is not present. No thyromegaly present.  Cardiovascular: Normal rate, regular rhythm and normal heart sounds.  Exam reveals no gallop and no friction rub.   No murmur heard. Pulmonary/Chest: Effort normal and breath sounds normal. No respiratory distress. She has no wheezes. She has no rales.  Abdominal: Soft. Bowel sounds are normal. There is no tenderness. There is no rebound.  Genitourinary: No breast swelling, tenderness or  discharge.  Musculoskeletal: Normal range of motion.  Lymphadenopathy:    She has no cervical adenopathy.  Neurological: She is alert and oriented to person, place, and time.  Skin: Skin is warm, dry and intact. No rash noted.  Psychiatric: She has a normal mood and affect. Her speech is normal and behavior is normal. Judgment and thought content normal. Cognition and memory are normal.    Results for orders placed or performed in visit on 08/04/15  WET PREP FOR [place], YEAST, CLUE  Result Value Ref Range   Trichomonas Exam Negative Negative   Yeast Exam Negative Negative   Clue Cell Exam Positive (A) Negative      Assessment & Plan:   Problem List Items Addressed This Visit      Unprioritized   Advanced care planning/counseling discussion - Primary    Has HCPOA and Living Will.  Husband is her health care poxy.        Anxiety and depression   Relevant Medications   sertraline (ZOLOFT) 50 MG tablet   Hyperlipidemia   Relevant Orders   Lipid Panel w/o Chol/HDL Ratio   Osteopenia    Taking Fossamax.  Order bone density      Relevant Orders   DG Bone Density   Pulmonary fibrosis ([place])    Other Visit Diagnoses    Herpes simplex type 1 infection       Relevant Medications   penciclovir (DENAVIR) 1 % cream   Annual physical exam       Breast cancer screening       Relevant Orders   MM DIGITAL SCREENING BILATERAL   Ovarian failure       Relevant Orders   DG Bone Density   Medication monitoring encounter       Relevant Orders   Comprehensive metabolic panel   CBC with Differential/Platelet       Follow up plan: Return in about 1 year (around 07/06/2017).

## 2016-07-06 NOTE — Patient Instructions (Signed)
Please do call to schedule your mammogram; the number to schedule one at either [facility] or [facility] is [phone]

## 2016-07-06 NOTE — Assessment & Plan Note (Signed)
Taking Fossamax.  Order bone density

## 2016-07-07 LAB — CBC WITH DIFFERENTIAL/PLATELET
BASOS ABS: 0 10*3/uL (ref 0.0–0.2)
BASOS: 1 %
EOS (ABSOLUTE): 0.2 10*3/uL (ref 0.0–0.4)
Eos: 3 %
HEMOGLOBIN: 12.1 g/dL (ref 11.1–15.9)
Hematocrit: 36.3 % (ref 34.0–46.6)
IMMATURE GRANS (ABS): 0 10*3/uL (ref 0.0–0.1)
IMMATURE GRANULOCYTES: 0 %
LYMPHS: 25 %
Lymphocytes Absolute: 1.8 10*3/uL (ref 0.7–3.1)
MCH: 29.7 pg (ref 26.6–33.0)
MCHC: 33.3 g/dL (ref 31.5–35.7)
MCV: 89 fL (ref 79–97)
MONOCYTES: 10 %
Monocytes Absolute: 0.8 10*3/uL (ref 0.1–0.9)
NEUTROS ABS: 4.5 10*3/uL (ref 1.4–7.0)
NEUTROS PCT: 61 %
PLATELETS: 310 10*3/uL (ref 150–379)
RBC: 4.08 x10E6/uL (ref 3.77–5.28)
RDW: 14.2 % (ref 12.3–15.4)
WBC: 7.3 10*3/uL (ref 3.4–10.8)

## 2016-07-07 LAB — COMPREHENSIVE METABOLIC PANEL
A/G RATIO: 1.8 (ref 1.2–2.2)
ALT: 17 IU/L (ref 0–32)
AST: 26 IU/L (ref 0–40)
Albumin: 3.9 g/dL (ref 3.6–4.8)
Alkaline Phosphatase: 51 IU/L (ref 39–117)
BUN/Creatinine Ratio: 22 (ref 12–28)
BUN: 15 mg/dL (ref 8–27)
Bilirubin Total: 0.3 mg/dL (ref 0.0–1.2)
CALCIUM: 8.8 mg/dL (ref 8.7–10.3)
CHLORIDE: 98 mmol/L (ref 96–106)
CO2: 25 mmol/L (ref 18–29)
Creatinine, Ser: 0.67 mg/dL (ref 0.57–1.00)
GFR, EST AFRICAN AMERICAN: 104 mL/min/{1.73_m2} (ref >59)
GFR, EST NON AFRICAN AMERICAN: 90 mL/min/{1.73_m2} (ref >59)
GLUCOSE: 78 mg/dL (ref 65–99)
Globulin, Total: 2.2 g/dL (ref 1.5–4.5)
POTASSIUM: 4.1 mmol/L (ref 3.5–5.2)
Sodium: 138 mmol/L (ref 134–144)
TOTAL PROTEIN: 6.1 g/dL (ref 6.0–8.5)

## 2016-07-07 LAB — LIPID PANEL W/O CHOL/HDL RATIO
Cholesterol, Total: 220 mg/dL — ABNORMAL HIGH (ref 100–199)
HDL: 90 mg/dL (ref >39)
LDL CALC: 109 mg/dL — AB (ref 0–99)
Triglycerides: 106 mg/dL (ref 0–149)
VLDL CHOLESTEROL CAL: 21 mg/dL (ref 5–40)

## 2016-08-15 DIAGNOSIS — I7300 Raynaud's syndrome without gangrene: Secondary | ICD-10-CM | POA: Diagnosis not present

## 2016-08-15 DIAGNOSIS — M359 Systemic involvement of connective tissue, unspecified: Secondary | ICD-10-CM | POA: Diagnosis not present

## 2016-08-15 DIAGNOSIS — J849 Interstitial pulmonary disease, unspecified: Secondary | ICD-10-CM | POA: Diagnosis not present

## 2016-08-16 ENCOUNTER — Ambulatory Visit
Admission: RE | Admit: 2016-08-16 | Discharge: 2016-08-16 | Disposition: A | Discharge status: Home / Self Care | Payer: Medicare Other | Source: Ambulatory Visit | Attending: Unknown Physician Specialty | Admitting: Unknown Physician Specialty

## 2016-08-16 ENCOUNTER — Encounter: Payer: Self-pay | Admitting: Unknown Physician Specialty

## 2016-08-16 ENCOUNTER — Telehealth: Payer: Self-pay | Admitting: Unknown Physician Specialty

## 2016-08-16 DIAGNOSIS — E2839 Other primary ovarian failure: Secondary | ICD-10-CM | POA: Insufficient documentation

## 2016-08-16 DIAGNOSIS — J8410 Pulmonary fibrosis, unspecified: Secondary | ICD-10-CM | POA: Insufficient documentation

## 2016-08-16 DIAGNOSIS — Z780 Asymptomatic menopausal state: Secondary | ICD-10-CM | POA: Diagnosis not present

## 2016-08-16 DIAGNOSIS — M85852 Other specified disorders of bone density and structure, left thigh: Secondary | ICD-10-CM | POA: Diagnosis not present

## 2016-08-16 DIAGNOSIS — M8580 Other specified disorders of bone density and structure, unspecified site: Secondary | ICD-10-CM | POA: Diagnosis present

## 2016-08-16 DIAGNOSIS — Z1231 Encounter for screening mammogram for malignant neoplasm of breast: Secondary | ICD-10-CM | POA: Insufficient documentation

## 2016-08-16 DIAGNOSIS — Z7952 Long term (current) use of systemic steroids: Secondary | ICD-10-CM | POA: Diagnosis not present

## 2016-08-16 DIAGNOSIS — M419 Scoliosis, unspecified: Secondary | ICD-10-CM | POA: Diagnosis not present

## 2016-08-16 DIAGNOSIS — Z1239 Encounter for other screening for malignant neoplasm of breast: Secondary | ICD-10-CM

## 2016-08-16 DIAGNOSIS — M069 Rheumatoid arthritis, unspecified: Secondary | ICD-10-CM | POA: Insufficient documentation

## 2016-08-16 NOTE — Telephone Encounter (Signed)
Patient called to get clarification on the vitamin and dosage Malachy Mood recommended regarding her bone density results.  Please advise  Thanks

## 2016-08-17 NOTE — Telephone Encounter (Signed)
Called and left patient a VM asking for her to please return my call.

## 2016-08-17 NOTE — Telephone Encounter (Signed)
Patient returned my call. Gave her the results on the letter that was mailed to her yesterday.

## 2016-08-28 DIAGNOSIS — M359 Systemic involvement of connective tissue, unspecified: Secondary | ICD-10-CM | POA: Diagnosis not present

## 2016-08-28 DIAGNOSIS — J8410 Pulmonary fibrosis, unspecified: Secondary | ICD-10-CM | POA: Diagnosis not present

## 2016-08-28 DIAGNOSIS — R0602 Shortness of breath: Secondary | ICD-10-CM | POA: Diagnosis not present

## 2016-12-05 DIAGNOSIS — Z872 Personal history of diseases of the skin and subcutaneous tissue: Secondary | ICD-10-CM | POA: Diagnosis not present

## 2016-12-05 DIAGNOSIS — L0109 Other impetigo: Secondary | ICD-10-CM | POA: Diagnosis not present

## 2016-12-05 DIAGNOSIS — L821 Other seborrheic keratosis: Secondary | ICD-10-CM | POA: Diagnosis not present

## 2016-12-05 DIAGNOSIS — L814 Other melanin hyperpigmentation: Secondary | ICD-10-CM | POA: Diagnosis not present

## 2016-12-10 ENCOUNTER — Ambulatory Visit (INDEPENDENT_AMBULATORY_CARE_PROVIDER_SITE_OTHER): Payer: Medicare Other | Admitting: Unknown Physician Specialty

## 2016-12-10 ENCOUNTER — Encounter: Payer: Self-pay | Admitting: Unknown Physician Specialty

## 2016-12-10 VITALS — BP 112/66 | HR 97 | Temp 97.5°F | Wt 147.0 lb

## 2016-12-10 DIAGNOSIS — L989 Disorder of the skin and subcutaneous tissue, unspecified: Secondary | ICD-10-CM

## 2016-12-10 MED ORDER — CLOTRIMAZOLE-BETAMETHASONE 1-0.05 % EX CREA: 1.0000 "application " | TOPICAL_CREAM | Freq: Two times a day (BID) | CUTANEOUS | 0 refills | Status: DC

## 2016-12-10 NOTE — Progress Notes (Signed)
   BP 112/66   Pulse 97   Temp (!) 97.5 F (36.4 C)   Wt 147 lb (66.7 kg)   LMP  (LMP Unknown)   SpO2 94%   BMI 25.63 kg/m    Subjective:    Patient ID: Melanie Rollins, female    DOB: [birth year], 71 y.o.   MRN: [id number]  HPI: Melanie Rollins is a 71 y.o. female  Chief Complaint  Patient presents with  . Rash    pt states she has a rash under her left breast that she noticed this morning    Skin lesion Pt has a skin lesion under left breast and wonders if it is shingles.  This appeared 2 days ago.  No other lesions noted.  No pain.   Dermatologist recently gave Bactroban for staph infection in nose.    Relevant past medical, surgical, family and social history reviewed and updated as indicated. Interim medical history since our last visit reviewed. Allergies and medications reviewed and updated.  Review of Systems  Per HPI unless specifically indicated above     Objective:    BP 112/66   Pulse 97   Temp (!) 97.5 F (36.4 C)   Wt 147 lb (66.7 kg)   LMP  (LMP Unknown)   SpO2 94%   BMI 25.63 kg/m   Wt Readings from Last 3 Encounters:  12/10/16 147 lb (66.7 kg)  07/06/16 137 lb (62.1 kg)  05/02/16 141 lb 14.4 oz (64.4 kg)    Physical Exam  Constitutional: She is oriented to person, place, and time. She appears well-developed and well-nourished. No distress.  HENT:  Head: Normocephalic and atraumatic.  Eyes: Conjunctivae and lids are normal. Right eye exhibits no discharge. Left eye exhibits no discharge. No scleral icterus.  Neck: Normal range of motion. Neck supple. No JVD present. Carotid bruit is not present.  Cardiovascular: Normal rate, regular rhythm and normal heart sounds.   Pulmonary/Chest: Effort normal and breath sounds normal.  Abdominal: Normal appearance. There is no splenomegaly or hepatomegaly.  Musculoskeletal: Normal range of motion.  Neurological: She is alert and oriented to person, place, and time.  Skin: Skin is warm, dry and intact. No  pallor.  Pt with flat 2cm erythemetous lesion under left breast  Psychiatric: She has a normal mood and affect. Her behavior is normal. Judgment and thought content normal.      Assessment & Plan:   Problem List Items Addressed This Visit    None    Visit Diagnoses    Skin lesion of chest wall    -  Primary   Does not seem like shingles.  Question tinea.  Will rx Lotrisone cream       Follow up plan: Return if symptoms worsen or fail to improve.

## 2016-12-27 DIAGNOSIS — Z79899 Other long term (current) drug therapy: Secondary | ICD-10-CM | POA: Diagnosis not present

## 2017-01-29 DIAGNOSIS — J8410 Pulmonary fibrosis, unspecified: Secondary | ICD-10-CM | POA: Diagnosis not present

## 2017-01-29 DIAGNOSIS — M359 Systemic involvement of connective tissue, unspecified: Secondary | ICD-10-CM | POA: Diagnosis not present

## 2017-02-20 DIAGNOSIS — M359 Systemic involvement of connective tissue, unspecified: Secondary | ICD-10-CM | POA: Diagnosis not present

## 2017-02-20 DIAGNOSIS — J849 Interstitial pulmonary disease, unspecified: Secondary | ICD-10-CM | POA: Diagnosis not present

## 2017-02-20 DIAGNOSIS — M810 Age-related osteoporosis without current pathological fracture: Secondary | ICD-10-CM | POA: Diagnosis not present

## 2017-02-20 DIAGNOSIS — Z79899 Other long term (current) drug therapy: Secondary | ICD-10-CM | POA: Diagnosis not present

## 2017-02-20 DIAGNOSIS — I7300 Raynaud's syndrome without gangrene: Secondary | ICD-10-CM | POA: Diagnosis not present

## 2017-03-04 DIAGNOSIS — Z23 Encounter for immunization: Secondary | ICD-10-CM | POA: Diagnosis not present

## 2017-03-26 DIAGNOSIS — Z79899 Other long term (current) drug therapy: Secondary | ICD-10-CM | POA: Diagnosis not present

## 2017-04-17 ENCOUNTER — Telehealth: Payer: Self-pay | Admitting: Unknown Physician Specialty

## 2017-04-17 MED ORDER — OMEPRAZOLE 20 MG PO CPDR
20.0000 mg | DELAYED_RELEASE_CAPSULE | Freq: Every day | ORAL | 3 refills | Status: DC
Start: 2017-04-17 — End: 2017-08-09

## 2017-04-17 NOTE — Addendum Note (Signed)
Addended by: Kathrine Haddock on: 04/17/2017 04:22 PM   Modules accepted: Orders

## 2017-04-17 NOTE — Telephone Encounter (Signed)
Copied from [place] [phone]. Topic: Quick Communication - See Telephone Encounter >> Apr 17, 2017  1:22 PM Corie Chiquito, [place] wrote: CRM for notification. See Telephone encounter for: Patient would like to know if Julian Hy could call her something in for acid reflux. If someone could give her a call back about this matter  04/17/17.

## 2017-04-17 NOTE — Telephone Encounter (Signed)
Pt. Called to report Prilosec has ben taken off the market and her reflux is "really bothering" her. Request something be called in.

## 2017-04-30 DIAGNOSIS — J8410 Pulmonary fibrosis, unspecified: Secondary | ICD-10-CM | POA: Diagnosis not present

## 2017-04-30 DIAGNOSIS — M359 Systemic involvement of connective tissue, unspecified: Secondary | ICD-10-CM | POA: Diagnosis not present

## 2017-04-30 DIAGNOSIS — Z79899 Other long term (current) drug therapy: Secondary | ICD-10-CM | POA: Diagnosis not present

## 2017-05-10 ENCOUNTER — Ambulatory Visit (INDEPENDENT_AMBULATORY_CARE_PROVIDER_SITE_OTHER): Payer: Medicare Other | Admitting: Unknown Physician Specialty

## 2017-05-10 ENCOUNTER — Encounter: Payer: Self-pay | Admitting: Unknown Physician Specialty

## 2017-05-10 VITALS — BP 135/72 | HR 77 | Temp 98.0°F | Wt 147.4 lb

## 2017-05-10 DIAGNOSIS — T753XXA Motion sickness, initial encounter: Secondary | ICD-10-CM

## 2017-05-10 DIAGNOSIS — M545 Low back pain, unspecified: Secondary | ICD-10-CM

## 2017-05-10 DIAGNOSIS — M7582 Other shoulder lesions, left shoulder: Secondary | ICD-10-CM | POA: Diagnosis not present

## 2017-05-10 DIAGNOSIS — M542 Cervicalgia: Secondary | ICD-10-CM | POA: Diagnosis not present

## 2017-05-10 MED ORDER — BACLOFEN 20 MG PO TABS
20.0000 mg | ORAL_TABLET | Freq: Three times a day (TID) | ORAL | 0 refills | Status: DC
Start: 2017-05-10 — End: 2017-11-28

## 2017-05-10 NOTE — Progress Notes (Signed)
BP 135/72   Pulse 77   Temp 98 F (36.7 C) (Oral)   Wt 147 lb 6.4 oz (66.9 kg)   LMP  (LMP Unknown)   SpO2 96%   BMI 25.70 kg/m    Subjective:    Patient ID: Melanie Rollins, female    DOB: 1945/06/01, 71 y.o.   MRN: [id number]  HPI: Melanie Rollins is a 71 y.o. female  Chief Complaint  Patient presents with  . Shoulder Pain    pt states she has been having left shoulder pain  . Neck Pain    pt states she has been having neck pain that has ran down her shoulders and into her back    Pt states from the first of October she stepped wrong off a curb and has been having pain from her neck down her lower pain.  States this aches and severity waxes and wanes.  Worse in the AM when she is about a 7 on a 1-10 scale.  Later it goes down to a 3-4.  Never really goes away.  Gets a massage once a month.    Shoulder pain about 3 weeks ago.  Thinks she reached behind her wrong.  Difficulty raising arm.  Radiates down upper arm.  Hurts to sleep on.      Going on a cruise and wondering what to take for sea sickness  Relevant past medical, surgical, family and social history reviewed and updated as indicated. Interim medical history since our last visit reviewed. Allergies and medications reviewed and updated.  Review of Systems  Constitutional: Negative.   Respiratory: Positive for shortness of breath.   Gastrointestinal: Negative.   Genitourinary: Negative.   Skin: Negative.   Psychiatric/Behavioral: Negative.     Per HPI unless specifically indicated above     Objective:    BP 135/72   Pulse 77   Temp 98 F (36.7 C) (Oral)   Wt 147 lb 6.4 oz (66.9 kg)   LMP  (LMP Unknown)   SpO2 96%   BMI 25.70 kg/m   Wt Readings from Last 3 Encounters:  05/10/17 147 lb 6.4 oz (66.9 kg)  12/10/16 147 lb (66.7 kg)  07/06/16 137 lb (62.1 kg)    Physical Exam  Constitutional: She is oriented to person, place, and time. She appears well-developed and well-nourished. No distress.  HENT:    Head: Normocephalic and atraumatic.  Eyes: Conjunctivae and lids are normal. Right eye exhibits no discharge. Left eye exhibits no discharge. No scleral icterus.  Neck: Normal range of motion. Neck supple. No JVD present. Carotid bruit is not present.  Cardiovascular: Normal rate, regular rhythm and normal heart sounds.  Pulmonary/Chest: Effort normal and breath sounds normal.  Abdominal: Normal appearance. There is no splenomegaly or hepatomegaly.  Musculoskeletal:       Left shoulder: She exhibits decreased range of motion, tenderness and pain.       Cervical back: She exhibits tenderness.       Lumbar back: She exhibits tenderness. She exhibits normal range of motion.  Positive empty can and impingement signs  Neurological: She is alert and oriented to person, place, and time.  Skin: Skin is warm, dry and intact. No rash noted. No pallor.  Psychiatric: She has a normal mood and affect. Her behavior is normal. Judgment and thought content normal.    Results for orders placed or performed in visit on 07/06/16  Lipid Panel w/o Chol/HDL Ratio  Result Value Ref Range  Cholesterol, Total 220 (H) 100 - 199 mg/dL   Triglycerides 106 0 - 149 mg/dL   HDL 90 >39 mg/dL   VLDL Cholesterol Cal 21 5 - 40 mg/dL   LDL Calculated 109 (H) 0 - 99 mg/dL  Comprehensive metabolic panel  Result Value Ref Range   Glucose 78 65 - 99 mg/dL   BUN 15 8 - 27 mg/dL   Creatinine, Ser 0.67 0.57 - 1.00 mg/dL   GFR calc non Af Amer 90 >59 mL/min/1.73   GFR calc Af Amer 104 >59 mL/min/1.73   BUN/Creatinine Ratio 22 12 - 28   Sodium 138 134 - 144 mmol/L   Potassium 4.1 3.5 - 5.2 mmol/L   Chloride 98 96 - 106 mmol/L   CO2 25 18 - 29 mmol/L   Calcium 8.8 8.7 - 10.3 mg/dL   Total Protein 6.1 6.0 - 8.5 g/dL   Albumin 3.9 3.6 - 4.8 g/dL   Globulin, Total 2.2 1.5 - 4.5 g/dL   Albumin/Globulin Ratio 1.8 1.2 - 2.2   Bilirubin Total 0.3 0.0 - 1.2 mg/dL   Alkaline Phosphatase 51 39 - 117 IU/L   AST 26 0 - 40 IU/L    ALT 17 0 - 32 IU/L  CBC with Differential/Platelet  Result Value Ref Range   WBC 7.3 3.4 - 10.8 x10E3/uL   RBC 4.08 3.77 - 5.28 x10E6/uL   Hemoglobin 12.1 11.1 - 15.9 g/dL   Hematocrit 36.3 34.0 - 46.6 %   MCV 89 79 - 97 fL   MCH 29.7 26.6 - 33.0 pg   MCHC 33.3 31.5 - 35.7 g/dL   RDW 14.2 12.3 - 15.4 %   Platelets 310 150 - 379 x10E3/uL   Neutrophils 61 Not Estab. %   Lymphs 25 Not Estab. %   Monocytes 10 Not Estab. %   Eos 3 Not Estab. %   Basos 1 Not Estab. %   Neutrophils Absolute 4.5 1.4 - 7.0 x10E3/uL   Lymphocytes Absolute 1.8 0.7 - 3.1 x10E3/uL   Monocytes Absolute 0.8 0.1 - 0.9 x10E3/uL   EOS (ABSOLUTE) 0.2 0.0 - 0.4 x10E3/uL   Basophils Absolute 0.0 0.0 - 0.2 x10E3/uL   Immature Granulocytes 0 Not Estab. %   Immature Grans (Abs) 0.0 0.0 - 0.1 x10E3/uL      Assessment & Plan:   Problem List Items Addressed This Visit    None    Visit Diagnoses    Acute bilateral low back pain without sciatica    -  Primary   Pain for several months.  Refer to PT.  Baclofen and NSAIDs   Relevant Medications   etodolac (LODINE) 200 MG capsule   baclofen (LIORESAL) 20 MG tablet   Neck pain       Refer to PT.  Baclofen and NSAIDs   Rotator cuff tendinitis, left       NSAIDs.  Discussed stretching.  Refer to PT   Relevant Medications   etodolac (LODINE) 200 MG capsule   baclofen (LIORESAL) 20 MG tablet   Sea sickness, initial encounter       Does not want patch.  Discussed OTC options      Already taking Etolodac.  Baclofen prescribed to take prn  Follow up plan: Return in about 4 weeks (around 06/07/2017).

## 2017-05-10 NOTE — Patient Instructions (Signed)
Yoga for back pain for beginners

## 2017-05-16 DIAGNOSIS — M545 Low back pain: Secondary | ICD-10-CM | POA: Diagnosis not present

## 2017-05-16 DIAGNOSIS — R262 Difficulty in walking, not elsewhere classified: Secondary | ICD-10-CM | POA: Diagnosis not present

## 2017-05-16 DIAGNOSIS — M7582 Other shoulder lesions, left shoulder: Secondary | ICD-10-CM | POA: Diagnosis not present

## 2017-05-16 DIAGNOSIS — M542 Cervicalgia: Secondary | ICD-10-CM | POA: Diagnosis not present

## 2017-05-20 DIAGNOSIS — M7582 Other shoulder lesions, left shoulder: Secondary | ICD-10-CM | POA: Diagnosis not present

## 2017-05-20 DIAGNOSIS — M545 Low back pain: Secondary | ICD-10-CM | POA: Diagnosis not present

## 2017-05-20 DIAGNOSIS — R262 Difficulty in walking, not elsewhere classified: Secondary | ICD-10-CM | POA: Diagnosis not present

## 2017-05-20 DIAGNOSIS — M542 Cervicalgia: Secondary | ICD-10-CM | POA: Diagnosis not present

## 2017-05-22 DIAGNOSIS — R262 Difficulty in walking, not elsewhere classified: Secondary | ICD-10-CM | POA: Diagnosis not present

## 2017-05-22 DIAGNOSIS — M542 Cervicalgia: Secondary | ICD-10-CM | POA: Diagnosis not present

## 2017-05-22 DIAGNOSIS — M7582 Other shoulder lesions, left shoulder: Secondary | ICD-10-CM | POA: Diagnosis not present

## 2017-05-22 DIAGNOSIS — M545 Low back pain: Secondary | ICD-10-CM | POA: Diagnosis not present

## 2017-06-03 DIAGNOSIS — R262 Difficulty in walking, not elsewhere classified: Secondary | ICD-10-CM | POA: Diagnosis not present

## 2017-06-03 DIAGNOSIS — M545 Low back pain: Secondary | ICD-10-CM | POA: Diagnosis not present

## 2017-06-03 DIAGNOSIS — M542 Cervicalgia: Secondary | ICD-10-CM | POA: Diagnosis not present

## 2017-06-03 DIAGNOSIS — M7582 Other shoulder lesions, left shoulder: Secondary | ICD-10-CM | POA: Diagnosis not present

## 2017-06-06 DIAGNOSIS — M545 Low back pain: Secondary | ICD-10-CM | POA: Diagnosis not present

## 2017-06-06 DIAGNOSIS — R262 Difficulty in walking, not elsewhere classified: Secondary | ICD-10-CM | POA: Diagnosis not present

## 2017-06-06 DIAGNOSIS — M542 Cervicalgia: Secondary | ICD-10-CM | POA: Diagnosis not present

## 2017-06-06 DIAGNOSIS — M7582 Other shoulder lesions, left shoulder: Secondary | ICD-10-CM | POA: Diagnosis not present

## 2017-06-10 DIAGNOSIS — R262 Difficulty in walking, not elsewhere classified: Secondary | ICD-10-CM | POA: Diagnosis not present

## 2017-06-10 DIAGNOSIS — M545 Low back pain: Secondary | ICD-10-CM | POA: Diagnosis not present

## 2017-06-10 DIAGNOSIS — M542 Cervicalgia: Secondary | ICD-10-CM | POA: Diagnosis not present

## 2017-06-10 DIAGNOSIS — M7582 Other shoulder lesions, left shoulder: Secondary | ICD-10-CM | POA: Diagnosis not present

## 2017-06-12 DIAGNOSIS — R262 Difficulty in walking, not elsewhere classified: Secondary | ICD-10-CM | POA: Diagnosis not present

## 2017-06-12 DIAGNOSIS — M545 Low back pain: Secondary | ICD-10-CM | POA: Diagnosis not present

## 2017-06-12 DIAGNOSIS — M7582 Other shoulder lesions, left shoulder: Secondary | ICD-10-CM | POA: Diagnosis not present

## 2017-06-12 DIAGNOSIS — M542 Cervicalgia: Secondary | ICD-10-CM | POA: Diagnosis not present

## 2017-06-17 ENCOUNTER — Ambulatory Visit
Admission: RE | Admit: 2017-06-17 | Discharge: 2017-06-17 | Disposition: A | Discharge status: Home / Self Care | Payer: Medicare Other | Source: Ambulatory Visit | Attending: Unknown Physician Specialty | Admitting: Unknown Physician Specialty

## 2017-06-17 ENCOUNTER — Ambulatory Visit (INDEPENDENT_AMBULATORY_CARE_PROVIDER_SITE_OTHER): Payer: Medicare Other | Admitting: Unknown Physician Specialty

## 2017-06-17 ENCOUNTER — Telehealth: Payer: Self-pay | Admitting: Unknown Physician Specialty

## 2017-06-17 ENCOUNTER — Encounter: Payer: Self-pay | Admitting: Unknown Physician Specialty

## 2017-06-17 VITALS — BP 134/80 | HR 77 | Temp 98.2°F | Wt 145.6 lb

## 2017-06-17 DIAGNOSIS — M545 Low back pain, unspecified: Secondary | ICD-10-CM

## 2017-06-17 DIAGNOSIS — M546 Pain in thoracic spine: Secondary | ICD-10-CM

## 2017-06-17 DIAGNOSIS — J849 Interstitial pulmonary disease, unspecified: Secondary | ICD-10-CM | POA: Insufficient documentation

## 2017-06-17 DIAGNOSIS — R262 Difficulty in walking, not elsewhere classified: Secondary | ICD-10-CM | POA: Diagnosis not present

## 2017-06-17 DIAGNOSIS — M542 Cervicalgia: Secondary | ICD-10-CM | POA: Insufficient documentation

## 2017-06-17 DIAGNOSIS — M2578 Osteophyte, vertebrae: Secondary | ICD-10-CM | POA: Diagnosis not present

## 2017-06-17 DIAGNOSIS — M7582 Other shoulder lesions, left shoulder: Secondary | ICD-10-CM | POA: Diagnosis not present

## 2017-06-17 DIAGNOSIS — M4184 Other forms of scoliosis, thoracic region: Secondary | ICD-10-CM | POA: Diagnosis not present

## 2017-06-17 DIAGNOSIS — J0100 Acute maxillary sinusitis, unspecified: Secondary | ICD-10-CM

## 2017-06-17 DIAGNOSIS — M4316 Spondylolisthesis, lumbar region: Secondary | ICD-10-CM | POA: Insufficient documentation

## 2017-06-17 MED ORDER — AMOXICILLIN 875 MG PO TABS
875.0000 mg | ORAL_TABLET | Freq: Two times a day (BID) | ORAL | 0 refills | Status: DC
Start: 2017-06-17 — End: 2017-07-15

## 2017-06-17 NOTE — Progress Notes (Signed)
BP 134/80   Pulse 77   Temp 98.2 F (36.8 C) (Oral)   Wt 145 lb 9.6 oz (66 kg)   LMP  (LMP Unknown)   SpO2 93%   BMI 25.39 kg/m    Subjective:    Patient ID: Melanie Rollins, female    DOB: 1945/11/22, 72 y.o.   MRN: [id number]  HPI: Melanie Rollins is a 72 y.o. female  Chief Complaint  Patient presents with  . Pain    4 week f/up for neck and back pain  . URI    pt states she has had a stuffy, runny nose for 2 weeks    Pain Back and neck pain.  Back pain comes and goes.  Neck hurts most of the time.  Seeing PT and getting some success with her therapy.  Baclofen makes her sleepy but helped her sleep.    Sinusitis  This is a new problem. Episode onset: 2 weeks. The problem has been waxing and waning since onset. Associated symptoms include congestion, coughing, headaches, shortness of breath, sinus pressure and a sore throat. Pertinent negatives include no chills, diaphoresis, ear pain, hoarse voice, neck pain, sneezing or swollen glands. Past treatments include acetaminophen.     Relevant past medical, surgical, family and social history reviewed and updated as indicated. Interim medical history since our last visit reviewed. Allergies and medications reviewed and updated.  Review of Systems  Constitutional: Negative for chills and diaphoresis.  HENT: Positive for congestion, sinus pressure and sore throat. Negative for ear pain, hoarse voice and sneezing.   Respiratory: Positive for cough and shortness of breath.   Musculoskeletal: Negative for neck pain.  Neurological: Positive for headaches.    Per HPI unless specifically indicated above     Objective:    BP 134/80   Pulse 77   Temp 98.2 F (36.8 C) (Oral)   Wt 145 lb 9.6 oz (66 kg)   LMP  (LMP Unknown)   SpO2 93%   BMI 25.39 kg/m   Wt Readings from Last 3 Encounters:  06/17/17 145 lb 9.6 oz (66 kg)  05/10/17 147 lb 6.4 oz (66.9 kg)  12/10/16 147 lb (66.7 kg)    Physical Exam  Constitutional: She is  oriented to person, place, and time. She appears well-developed and well-nourished. No distress.  HENT:  Head: Normocephalic and atraumatic.  Right Ear: Tympanic membrane and ear canal normal.  Left Ear: Tympanic membrane and ear canal normal.  Nose: Rhinorrhea present. Right sinus exhibits no maxillary sinus tenderness and no frontal sinus tenderness. Left sinus exhibits no maxillary sinus tenderness and no frontal sinus tenderness.  Mouth/Throat: Mucous membranes are normal. Posterior oropharyngeal erythema present.  Eyes: Conjunctivae and lids are normal. Right eye exhibits no discharge. Left eye exhibits no discharge. No scleral icterus.  Cardiovascular: Normal rate and regular rhythm.  Pulmonary/Chest: Effort normal and breath sounds normal. No respiratory distress.  Abdominal: Normal appearance. There is no splenomegaly or hepatomegaly.  Musculoskeletal:       Cervical back: She exhibits decreased range of motion and tenderness. She exhibits no bony tenderness.       Thoracic back: She exhibits no bony tenderness.  Neurological: She is alert and oriented to person, place, and time.  Skin: Skin is intact. No rash noted. No pallor.  Psychiatric: She has a normal mood and affect. Her behavior is normal. Judgment and thought content normal.      Assessment & Plan:   Problem List Items  Addressed This Visit      Unprioritized   Cervical pain (neck)    Baclofen prn.  Get x-ray.  Discussed Acetamenaphine as opposed to Etolodac due to Prednisone.  OK to take but caution advised.        Relevant Orders   DG Cervical Spine Complete   Thoracic spine pain    On chronic prednisone.  X-ray to rule out compression fracture      Relevant Orders   DG Thoracic Spine W/Swimmers    Other Visit Diagnoses    Acute non-recurrent maxillary sinusitis    -  Primary   Rx for Amoxil.  States she did well with this last time she had a sinus infection.  On prednisone so may be immunocompromised.   Saline sprays   Relevant Medications   amoxicillin (AMOXIL) 875 MG tablet       Follow up plan: Return for at physical next month.

## 2017-06-17 NOTE — Telephone Encounter (Signed)
I just want to make sure they are still doing neck and throacic spine x-rays

## 2017-06-17 NOTE — Telephone Encounter (Signed)
Melanie Rollins at [facility] said they are doing all x-rays- neck, thoracic, and lumbar.

## 2017-06-17 NOTE — Assessment & Plan Note (Signed)
On chronic prednisone.  X-ray to rule out compression fracture

## 2017-06-17 NOTE — Telephone Encounter (Signed)
Routing to provider.

## 2017-06-17 NOTE — Assessment & Plan Note (Signed)
Baclofen prn.  Get x-ray.  Discussed Acetamenaphine as opposed to Etolodac due to Prednisone.  OK to take but caution advised.

## 2017-06-17 NOTE — Telephone Encounter (Signed)
Copied from [place]. Topic: Quick Communication - See Telephone Encounter >> Jun 17, 2017 12:16 PM Boyd Kerbs wrote: CRM for notification. See Telephone encounter for:   Abigail Butts from [facility] [phone]  Calling,  patient is there and they are needing order put in for lumbar spine for pain..  They are waiting for Order  06/17/17.

## 2017-06-19 DIAGNOSIS — M545 Low back pain: Secondary | ICD-10-CM | POA: Diagnosis not present

## 2017-06-19 DIAGNOSIS — M7582 Other shoulder lesions, left shoulder: Secondary | ICD-10-CM | POA: Diagnosis not present

## 2017-06-19 DIAGNOSIS — R262 Difficulty in walking, not elsewhere classified: Secondary | ICD-10-CM | POA: Diagnosis not present

## 2017-06-19 DIAGNOSIS — M542 Cervicalgia: Secondary | ICD-10-CM | POA: Diagnosis not present

## 2017-06-20 DIAGNOSIS — H04123 Dry eye syndrome of bilateral lacrimal glands: Secondary | ICD-10-CM | POA: Diagnosis not present

## 2017-06-24 DIAGNOSIS — R262 Difficulty in walking, not elsewhere classified: Secondary | ICD-10-CM | POA: Diagnosis not present

## 2017-06-24 DIAGNOSIS — M545 Low back pain: Secondary | ICD-10-CM | POA: Diagnosis not present

## 2017-06-24 DIAGNOSIS — M542 Cervicalgia: Secondary | ICD-10-CM | POA: Diagnosis not present

## 2017-06-24 DIAGNOSIS — M7582 Other shoulder lesions, left shoulder: Secondary | ICD-10-CM | POA: Diagnosis not present

## 2017-06-26 DIAGNOSIS — M7582 Other shoulder lesions, left shoulder: Secondary | ICD-10-CM | POA: Diagnosis not present

## 2017-06-26 DIAGNOSIS — M545 Low back pain: Secondary | ICD-10-CM | POA: Diagnosis not present

## 2017-06-26 DIAGNOSIS — R262 Difficulty in walking, not elsewhere classified: Secondary | ICD-10-CM | POA: Diagnosis not present

## 2017-06-26 DIAGNOSIS — M542 Cervicalgia: Secondary | ICD-10-CM | POA: Diagnosis not present

## 2017-07-01 DIAGNOSIS — R262 Difficulty in walking, not elsewhere classified: Secondary | ICD-10-CM | POA: Diagnosis not present

## 2017-07-01 DIAGNOSIS — M542 Cervicalgia: Secondary | ICD-10-CM | POA: Diagnosis not present

## 2017-07-01 DIAGNOSIS — M545 Low back pain: Secondary | ICD-10-CM | POA: Diagnosis not present

## 2017-07-01 DIAGNOSIS — M7582 Other shoulder lesions, left shoulder: Secondary | ICD-10-CM | POA: Diagnosis not present

## 2017-07-03 DIAGNOSIS — M7582 Other shoulder lesions, left shoulder: Secondary | ICD-10-CM | POA: Diagnosis not present

## 2017-07-03 DIAGNOSIS — M542 Cervicalgia: Secondary | ICD-10-CM | POA: Diagnosis not present

## 2017-07-03 DIAGNOSIS — R262 Difficulty in walking, not elsewhere classified: Secondary | ICD-10-CM | POA: Diagnosis not present

## 2017-07-03 DIAGNOSIS — M545 Low back pain: Secondary | ICD-10-CM | POA: Diagnosis not present

## 2017-07-08 DIAGNOSIS — R262 Difficulty in walking, not elsewhere classified: Secondary | ICD-10-CM | POA: Diagnosis not present

## 2017-07-08 DIAGNOSIS — M542 Cervicalgia: Secondary | ICD-10-CM | POA: Diagnosis not present

## 2017-07-08 DIAGNOSIS — M545 Low back pain: Secondary | ICD-10-CM | POA: Diagnosis not present

## 2017-07-08 DIAGNOSIS — M7582 Other shoulder lesions, left shoulder: Secondary | ICD-10-CM | POA: Diagnosis not present

## 2017-07-11 DIAGNOSIS — M7582 Other shoulder lesions, left shoulder: Secondary | ICD-10-CM | POA: Diagnosis not present

## 2017-07-11 DIAGNOSIS — R262 Difficulty in walking, not elsewhere classified: Secondary | ICD-10-CM | POA: Diagnosis not present

## 2017-07-11 DIAGNOSIS — M545 Low back pain: Secondary | ICD-10-CM | POA: Diagnosis not present

## 2017-07-11 DIAGNOSIS — M542 Cervicalgia: Secondary | ICD-10-CM | POA: Diagnosis not present

## 2017-07-15 ENCOUNTER — Encounter: Payer: Self-pay | Admitting: Unknown Physician Specialty

## 2017-07-15 ENCOUNTER — Other Ambulatory Visit: Payer: Self-pay

## 2017-07-15 ENCOUNTER — Ambulatory Visit (INDEPENDENT_AMBULATORY_CARE_PROVIDER_SITE_OTHER): Payer: Medicare Other | Admitting: Unknown Physician Specialty

## 2017-07-15 ENCOUNTER — Ambulatory Visit (INDEPENDENT_AMBULATORY_CARE_PROVIDER_SITE_OTHER): Payer: Medicare Other

## 2017-07-15 VITALS — BP 123/77 | HR 98 | Temp 98.0°F | Resp 16 | Ht 63.6 in | Wt 146.2 lb

## 2017-07-15 VITALS — BP 123/77 | HR 67 | Temp 98.0°F | Ht 63.6 in | Wt 146.2 lb

## 2017-07-15 DIAGNOSIS — Z5181 Encounter for therapeutic drug level monitoring: Secondary | ICD-10-CM | POA: Diagnosis not present

## 2017-07-15 DIAGNOSIS — M545 Low back pain: Secondary | ICD-10-CM | POA: Diagnosis not present

## 2017-07-15 DIAGNOSIS — Z0000 Encounter for general adult medical examination without abnormal findings: Secondary | ICD-10-CM

## 2017-07-15 DIAGNOSIS — J8410 Pulmonary fibrosis, unspecified: Secondary | ICD-10-CM

## 2017-07-15 DIAGNOSIS — F419 Anxiety disorder, unspecified: Secondary | ICD-10-CM

## 2017-07-15 DIAGNOSIS — F329 Major depressive disorder, single episode, unspecified: Secondary | ICD-10-CM | POA: Diagnosis not present

## 2017-07-15 DIAGNOSIS — E782 Mixed hyperlipidemia: Secondary | ICD-10-CM

## 2017-07-15 DIAGNOSIS — M1990 Unspecified osteoarthritis, unspecified site: Secondary | ICD-10-CM | POA: Diagnosis not present

## 2017-07-15 DIAGNOSIS — Z1231 Encounter for screening mammogram for malignant neoplasm of breast: Secondary | ICD-10-CM

## 2017-07-15 DIAGNOSIS — M542 Cervicalgia: Secondary | ICD-10-CM | POA: Diagnosis not present

## 2017-07-15 DIAGNOSIS — Z7189 Other specified counseling: Secondary | ICD-10-CM

## 2017-07-15 DIAGNOSIS — M7582 Other shoulder lesions, left shoulder: Secondary | ICD-10-CM | POA: Diagnosis not present

## 2017-07-15 DIAGNOSIS — Z1239 Encounter for other screening for malignant neoplasm of breast: Secondary | ICD-10-CM

## 2017-07-15 DIAGNOSIS — Z1159 Encounter for screening for other viral diseases: Secondary | ICD-10-CM | POA: Diagnosis not present

## 2017-07-15 DIAGNOSIS — R262 Difficulty in walking, not elsewhere classified: Secondary | ICD-10-CM | POA: Diagnosis not present

## 2017-07-15 DIAGNOSIS — F32A Depression, unspecified: Secondary | ICD-10-CM

## 2017-07-15 NOTE — Assessment & Plan Note (Signed)
Controlled with Sertraline at this time

## 2017-07-15 NOTE — Assessment & Plan Note (Signed)
Seeing pulmonary.  Stable.  OK for paperwork here in office

## 2017-07-15 NOTE — Patient Instructions (Addendum)
Melanie Rollins , Thank you for taking time to come for your Medicare Wellness Visit. I appreciate your ongoing commitment to your health goals. Please review the following plan we discussed and let me know if I can assist you in the future.   Screening recommendations/referrals: Colonoscopy: completed 08/17/2009 Mammogram: completed 08/16/2016, Please call [phone] to schedule your mammogram.  Bone Density: completed 08/16/2016 Recommended yearly ophthalmology/optometry visit for glaucoma screening and checkup Recommended yearly dental visit for hygiene and checkup  Vaccinations: Influenza vaccine: up to date Pneumococcal vaccine: up to date, completed series Tdap vaccine: up to date, due 02/2019 Shingles vaccine: up to date on shingrix vaccine.    Advanced directives: copy on file   Conditions/risks identified: Recommend to continue to drink at least 6-8 glasses of water a day   Next appointment: Follow up in one year for your annual wellness exam.    Preventive Care 65 Years and Older, Female Preventive care refers to lifestyle choices and visits with your health care provider that can promote health and wellness. What does preventive care include?  A yearly physical exam. This is also called an annual well check.  Dental exams once or twice a year.  Routine eye exams. Ask your health care provider how often you should have your eyes checked.  Personal lifestyle choices, including:  Daily care of your teeth and gums.  Regular physical activity.  Eating a healthy diet.  Avoiding tobacco and drug use.  Limiting alcohol use.  Practicing safe sex.  Taking low-dose aspirin every day.  Taking vitamin and mineral supplements as recommended by your health care provider. What happens during an annual well check? The services and screenings done by your health care provider during your annual well check will depend on your age, overall health, lifestyle risk factors, and family  history of disease. Counseling  Your health care provider may ask you questions about your:  Alcohol use.  Tobacco use.  Drug use.  Emotional well-being.  Home and relationship well-being.  Sexual activity.  Eating habits.  History of falls.  Memory and ability to understand (cognition).  Work and work Statistician.  Reproductive health. Screening  You may have the following tests or measurements:  Height, weight, and BMI.  Blood pressure.  Lipid and cholesterol levels. These may be checked every 5 years, or more frequently if you are over 54 years old.  Skin check.  Lung cancer screening. You may have this screening every year starting at age 32 if you have a 30-pack-year history of smoking and currently smoke or have quit within the past 15 years.  Fecal occult blood test (FOBT) of the stool. You may have this test every year starting at age 61.  Flexible sigmoidoscopy or colonoscopy. You may have a sigmoidoscopy every 5 years or a colonoscopy every 10 years starting at age 27.  Hepatitis C blood test.  Hepatitis B blood test.  Sexually transmitted disease (STD) testing.  Diabetes screening. This is done by checking your blood sugar (glucose) after you have not eaten for a while (fasting). You may have this done every 1-3 years.  Bone density scan. This is done to screen for osteoporosis. You may have this done starting at age 11.  Mammogram. This may be done every 1-2 years. Talk to your health care provider about how often you should have regular mammograms. Talk with your health care provider about your test results, treatment options, and if necessary, the need for more tests. Vaccines  Your  health care provider may recommend certain vaccines, such as:  Influenza vaccine. This is recommended every year.  Tetanus, diphtheria, and acellular pertussis (Tdap, Td) vaccine. You may need a Td booster every 10 years.  Zoster vaccine. You may need this after  age 70.  Pneumococcal 13-valent conjugate (PCV13) vaccine. One dose is recommended after age 30.  Pneumococcal polysaccharide (PPSV23) vaccine. One dose is recommended after age 16. Talk to your health care provider about which screenings and vaccines you need and how often you need them. This information is not intended to replace advice given to you by your health care provider. Make sure you discuss any questions you have with your health care provider. Document Released: 06/03/2015 Document Revised: 01/25/2016 Document Reviewed: 03/08/2015 Elsevier Interactive Patient Education  2017 [place] Prevention in the Home Falls can cause injuries. They can happen to people of all ages. There are many things you can do to make your home safe and to help prevent falls. What can I do on the outside of my home?  Regularly fix the edges of walkways and driveways and fix any cracks.  Remove anything that might make you trip as you walk through a door, such as a raised step or threshold.  Trim any bushes or trees on the path to your home.  Use bright outdoor lighting.  Clear any walking paths of anything that might make someone trip, such as rocks or tools.  Regularly check to see if handrails are loose or broken. Make sure that both sides of any steps have handrails.  Any raised decks and porches should have guardrails on the edges.  Have any leaves, snow, or ice cleared regularly.  Use sand or salt on walking paths during winter.  Clean up any spills in your garage right away. This includes oil or grease spills. What can I do in the bathroom?  Use night lights.  Install grab bars by the toilet and in the tub and shower. Do not use towel bars as grab bars.  Use non-skid mats or decals in the tub or shower.  If you need to sit down in the shower, use a plastic, non-slip stool.  Keep the floor dry. Clean up any water that spills on the floor as soon as it  happens.  Remove soap buildup in the tub or shower regularly.  Attach bath mats securely with double-sided non-slip rug tape.  Do not have throw rugs and other things on the floor that can make you trip. What can I do in the bedroom?  Use night lights.  Make sure that you have a light by your bed that is easy to reach.  Do not use any sheets or blankets that are too big for your bed. They should not hang down onto the floor.  Have a firm chair that has side arms. You can use this for support while you get dressed.  Do not have throw rugs and other things on the floor that can make you trip. What can I do in the kitchen?  Clean up any spills right away.  Avoid walking on wet floors.  Keep items that you use a lot in easy-to-reach places.  If you need to reach something above you, use a strong step stool that has a grab bar.  Keep electrical cords out of the way.  Do not use floor polish or wax that makes floors slippery. If you must use wax, use non-skid floor wax.  Do not  have throw rugs and other things on the floor that can make you trip. What can I do with my stairs?  Do not leave any items on the stairs.  Make sure that there are handrails on both sides of the stairs and use them. Fix handrails that are broken or loose. Make sure that handrails are as long as the stairways.  Check any carpeting to make sure that it is firmly attached to the stairs. Fix any carpet that is loose or worn.  Avoid having throw rugs at the top or bottom of the stairs. If you do have throw rugs, attach them to the floor with carpet tape.  Make sure that you have a light switch at the top of the stairs and the bottom of the stairs. If you do not have them, ask someone to add them for you. What else can I do to help prevent falls?  Wear shoes that:  Do not have high heels.  Have rubber bottoms.  Are comfortable and fit you well.  Are closed at the toe. Do not wear sandals.  If you  use a stepladder:  Make sure that it is fully opened. Do not climb a closed stepladder.  Make sure that both sides of the stepladder are locked into place.  Ask someone to hold it for you, if possible.  Clearly mark and make sure that you can see:  Any grab bars or handrails.  First and last steps.  Where the edge of each step is.  Use tools that help you move around (mobility aids) if they are needed. These include:  Canes.  Walkers.  Scooters.  Crutches.  Turn on the lights when you go into a dark area. Replace any light bulbs as soon as they burn out.  Set up your furniture so you have a clear path. Avoid moving your furniture around.  If any of your floors are uneven, fix them.  If there are any pets around you, be aware of where they are.  Review your medicines with your doctor. Some medicines can make you feel dizzy. This can increase your chance of falling. Ask your doctor what other things that you can do to help prevent falls. This information is not intended to replace advice given to you by your health care provider. Make sure you discuss any questions you have with your health care provider. Document Released: 03/03/2009 Document Revised: 10/13/2015 Document Reviewed: 06/11/2014 Elsevier Interactive Patient Education  2017 [employer].

## 2017-07-15 NOTE — Progress Notes (Signed)
Subjective:   Melanie Rollins is a 72 y.o. female who presents for Medicare Annual (Subsequent) preventive examination.  Review of Systems:   Cardiac Risk Factors include: advanced age (>67mn, >>14women);dyslipidemia     Objective:     Vitals: BP 123/77 (BP Location: Left Arm, Patient Position: Sitting)   Pulse 98   Temp 98 F (36.7 C) (Oral)   Resp 16   Ht 5' 3.6" (1.615 m)   Wt 146 lb 3.2 oz (66.3 kg)   LMP  (LMP Unknown)   BMI 25.41 kg/m   Body mass index is 25.41 kg/m.  Advanced Directives 07/15/2017 07/06/2016 07/04/2015 07/04/2015  Does Patient Have a Medical Advance Directive? Yes Yes Yes Yes  Type of AParamed[place]of [place] will [place]Living will [place]Living will [place]Living will  Does patient want to make changes to medical advance directive? - - No - Patient declined -  Copy of HFalls Viewin Chart? Yes - No - copy requested -    Tobacco Social History   Tobacco Use  Smoking Status Never Smoker  Smokeless Tobacco Never Used     Counseling given: Not Answered   Clinical Intake:  Pre-visit preparation completed: Yes  Pain : No/denies pain     Nutritional Status: BMI 25 -29 Overweight Nutritional Risks: None Diabetes: No  How often do you need to have someone help you when you read instructions, pamphlets, or other written materials from your doctor or pharmacy?: 1 - Never What is the last grade level you completed in school?: associates degree  Interpreter Needed?: No  Information entered by :: Valeri Sula,LPN   Past Medical History:  Diagnosis Date  . Arthritis    RA  . Hyperlipidemia   . Pulmonary fibrosis ([place]   . [place] syndrome ([facility] [place]    Past Surgical History:  Procedure Laterality Date  . kidney stones    . LUNG BIOPSY     Family History  Problem Relation Age of Onset  . Hypertension Mother   . Heart disease  Mother        pacemaker and CHF  . Glaucoma Mother   . Hypertension Brother   . Arthritis Sister   . Heart disease Maternal Grandmother   . Heart disease Maternal Grandfather   . Hypertension Paternal Grandmother   . Stroke Paternal Grandmother   . Cancer Paternal Grandfather    Social History   Socioeconomic History  . Marital status: Married    Spouse name: None  . Number of children: None  . Years of education: None  . Highest education level: None  Social Needs  . Financial resource strain: Not hard at all  . Food insecurity - worry: Never true  . Food insecurity - inability: Never true  . Transportation needs - medical: No  . Transportation needs - non-medical: No  Occupational History  . None  Tobacco Use  . Smoking status: Never Smoker  . Smokeless tobacco: Never Used  Substance and Sexual Activity  . Alcohol use: Yes    Alcohol/week: 0.0 oz    Comment: pt states she drinks wine on occasion  . Drug use: No  . Sexual activity: Yes  Other Topics Concern  . None  Social History Narrative  . None    Outpatient Encounter Medications as of 07/15/2017  Medication Sig  . Acetylcysteine (NAC) 600 MG CAPS Take 600 mg by mouth 2 (two) times daily.  .Marland Kitchen  alendronate (FOSAMAX) 70 MG tablet Take 70 mg by mouth once a week. Take with a full glass of water on an empty stomach.  . baclofen (LIORESAL) 20 MG tablet Take 1 tablet (20 mg total) by mouth 3 (three) times daily.  . Biotin 10000 MCG TBDP Take 10,000 mcg by mouth daily.  . Calcium Citrate-Vitamin D (CALCIUM + D PO) Take 600 mg by mouth daily.  . Cholecalciferol (VITAMIN D) 2000 units CAPS Take 2,000 Units by mouth daily.  Marland Kitchen CINNAMON PO Take 1,000 mg by mouth daily.  . clonazePAM (KLONOPIN) 0.5 MG tablet Take 0.5 mg by mouth 2 (two) times daily as needed for anxiety.  . clotrimazole-betamethasone (LOTRISONE) cream Apply 1 application topically 2 (two) times daily.  Marland Kitchen conjugated estrogens (PREMARIN) vaginal cream Place 1  Applicatorful vaginally 2 (two) times a week. Reported on 05/11/2015  . etodolac (LODINE) 200 MG capsule Take 200 mg by mouth every 8 (eight) hours as needed.  . hydroxychloroquine (PLAQUENIL) 200 MG tablet Take 2 tablets (400 mg total) by mouth once daily.  Marland Kitchen L-Lysine 500 MG TABS Take 500 mg by mouth daily.  . lansoprazole (PREVACID) 15 MG capsule Take 15 mg by mouth daily at 12 noon.  . Magnesium 250 MG TABS Take by mouth daily.  . Multiple Vitamin (MULTIVITAMIN) tablet Take 1 tablet by mouth daily.  . Omega-3 Fatty Acids (FISH OIL) 1000 MG CAPS Take 1,000 mg by mouth daily.  Marland Kitchen omeprazole (PRILOSEC) 20 MG capsule Take 1 capsule (20 mg total) by mouth daily.  Marland Kitchen penciclovir (DENAVIR) 1 % cream Apply 1 application topically every 2 (two) hours.  . predniSONE (DELTASONE) 10 MG tablet Take 10 mg by mouth daily with breakfast.  . sertraline (ZOLOFT) 50 MG tablet Take 1 tablet (50 mg total) by mouth daily.  . [DISCONTINUED] zolpidem (AMBIEN) 10 MG tablet Take 10 mg by mouth at bedtime as needed for sleep.   No facility-administered encounter medications on file as of 07/15/2017.     Activities of Daily Living In your present state of health, do you have any difficulty performing the following activities: 07/15/2017 07/15/2017  Hearing? N N  Vision? N N  Difficulty concentrating or making decisions? N N  Walking or climbing stairs? Y Y  Comment SOB on o2 2 liters -  Dressing or bathing? N N  Doing errands, shopping? N N  Preparing Food and eating ? N -  Using the Toilet? N -  In the past six months, have you accidently leaked urine? N -  Do you have problems with loss of bowel control? N -  Managing your Medications? N -  Managing your Finances? N -  Housekeeping or managing your Housekeeping? N -  Some recent data might be hidden    Patient Care Team: Kathrine Haddock, NP as PCP - General (Nurse Practitioner) Erby Pian, MD as Referring Physician (Specialist) Emmaline Kluver., MD (Rheumatology)    Assessment:   This is a routine wellness examination for [name].  Exercise Activities and Dietary recommendations Current Exercise Habits: Structured exercise class, Type of exercise: strength training/weights, Time (Minutes): 45, Frequency (Times/Week): 2, Weekly Exercise (Minutes/Week): 90, Intensity: Mild, Exercise limited by: None identified  Goals    None      Fall Risk Fall Risk  07/15/2017 07/06/2016 02/06/2016 07/04/2015 05/13/2015  Falls in the past year? _0    Is the patient's home free of loose throw rugs in walkways, pet beds, electrical  cords, etc?   yes      Grab bars in the bathroom? no      Handrails on the stairs?   yes      Adequate lighting?   yes  Timed Get Up and Go performed: Completed in 8 seconds with no use of assistive devices, steady gait. No intervention needed at this time.   Depression Screen PHQ 2/9 Scores 07/15/2017 07/06/2016 05/04/2016 02/06/2016  PHQ - 2 Score 0 0 0 0  PHQ- 9 Score _0 Cognitive Function     6CIT Screen 07/15/2017  What Year? 0 points  What month? 0 points  What time? 0 points  Count back from 20 0 points  Months in reverse 0 points  Repeat phrase 0 points  Total Score 0    Immunization History  Administered Date(s) Administered  . Influenza-Unspecified 02/17/2015, 03/09/2016, 02/07/2017  . Pneumococcal Conjugate-13 07/02/2014  . Pneumococcal Polysaccharide-23 07/04/2015  . Td 03/16/2009  . Zoster 07/06/2016  . Zoster Recombinat (Shingrix) 07/11/2016, 10/29/2016    Qualifies for Shingles Vaccine?has completed shingrix series  Screening Tests Health Maintenance  Topic Date Due  . Hepatitis C Screening  [birth year]  . MAMMOGRAM  08/17/2018  . TETANUS/TDAP  03/17/2019  . COLONOSCOPY  08/18/2019  . INFLUENZA VACCINE  Completed  . DEXA SCAN  Completed  . PNA vac Low Risk Adult  Completed    Cancer Screenings: Lung: Low Dose CT Chest recommended if Age 71-80 years, 30  pack-year currently smoking OR have quit w/in 15years. Patient does not qualify. Breast:  Up to date on Mammogram? Yes   Up to date of Bone Density/Dexa? Yes Colorectal: colonoscopy completed 08/17/2009  Additional Screenings:  Hepatitis B/HIV/Syphillis:not indicated Hepatitis C Screening: completed today      Plan:    I have personally reviewed and addressed the Medicare Annual Wellness questionnaire and have noted the following in the patient's chart:  A. Medical and social history B. Use of alcohol, tobacco or illicit drugs  C. Current medications and supplements D. Functional ability and status E.  Nutritional status F.  Physical activity G. Advance directives H. List of other physicians I.  Hospitalizations, surgeries, and ER visits in previous 12 months J.  [place] such as hearing and vision if needed, cognitive and depression L. Referrals and appointments   In addition, I have reviewed and discussed with patient certain preventive protocols, quality metrics, and best practice recommendations. A written personalized care plan for preventive services as well as general preventive health recommendations were provided to patient.   Signed,  Tyler Aas, LPN Nurse Health Advisor   Nurse Notes: none

## 2017-07-15 NOTE — Progress Notes (Signed)
BP 123/77   Pulse 67   Temp 98 F (36.7 C) (Oral)   Ht 5' 3.6" (1.615 m)   Wt 146 lb 3.2 oz (66.3 kg)   LMP  (LMP Unknown)   SpO2 98%   BMI 25.41 kg/m    Subjective:    Patient ID: Melanie Rollins, female    DOB: Jun 26, 1945, 72 y.o.   MRN: [id number]  HPI: Melanie Rollins is a 72 y.o. female  Chief Complaint  Patient presents with  . Annual Exam    pt seeing [facility] after this visit  . Labs Only    pt ok with having hep C test   Pulmonary Fibrosis Pt states she is doing well.  Pt would like to have a form filled out from [employer] stating that she needs O2 intermittently.    Trigeminal Neuralgia Pt having intermittent shooting pain right side of the face.  She had several episodes but has since when away.    Osteoporosis On Fosamax.  Under care of Dr. Jefm Bryant.  On treatment for a while due to prednisone  Insomnia Pt states this is a lot better and not taking Ambien.    Depression Doesn't take Clonazepam anymore.  Doing well with Sertralin Depression screen [facility] 2/9 07/15/2017 07/06/2016 05/04/2016 02/06/2016 12/13/2015  Decreased Interest 0 0 0 0 0  Down, Depressed, Hopeless 0 0 0 0 0  PHQ - 2 Score 0 0 0 0 0  Altered sleeping _0 -  Tired, decreased energy _1 -  Change in appetite 1 0 0 0 -  Feeling bad or failure about yourself  0 0 0 0 -  Trouble concentrating 1 0 0 0 -  Moving slowly or fidgety/restless 0 0 0 0 -  Suicidal thoughts 0 0 0 0 -  PHQ-9 Score _2 -  Difficult doing work/chores Very difficult - Somewhat difficult Somewhat difficult -     Social History   Socioeconomic History  . Marital status: Married    Spouse name: Not on file  . Number of children: Not on file  . Years of education: Not on file  . Highest education level: Not on file  Social Needs  . Financial resource strain: Not on file  . Food insecurity - worry: Not on file  . Food insecurity - inability: Not on file  . Transportation needs - medical: Not on file  .  Transportation needs - non-medical: Not on file  Occupational History  . Not on file  Tobacco Use  . Smoking status: Never Smoker  . Smokeless tobacco: Never Used  Substance and Sexual Activity  . Alcohol use: Yes    Alcohol/week: 0.0 oz    Comment: pt states she drinks wine on occasion  . Drug use: No  . Sexual activity: Yes  Other Topics Concern  . Not on file  Social History Narrative  . Not on file   Family History  Problem Relation Age of Onset  . Hypertension Mother   . Heart disease Mother        pacemaker and CHF  . Glaucoma Mother   . Hypertension Brother   . Arthritis Sister   . Heart disease Maternal Grandmother   . Heart disease Maternal Grandfather   . Hypertension Paternal Grandmother   . Stroke Paternal Grandmother   . Cancer Paternal Grandfather    Past Medical History:  Diagnosis Date  . Arthritis  RA  . Pulmonary fibrosis ([place])   . Reynolds syndrome [facility])    Past Surgical History:  Procedure Laterality Date  . kidney stones    . LUNG BIOPSY      Relevant past medical, surgical, family and social history reviewed and updated as indicated. Interim medical history since our last visit reviewed. Allergies and medications reviewed and updated.  Review of Systems  All other systems reviewed and are negative.   Per HPI unless specifically indicated above     Objective:    BP 123/77   Pulse 67   Temp 98 F (36.7 C) (Oral)   Ht 5' 3.6" (1.615 m)   Wt 146 lb 3.2 oz (66.3 kg)   LMP  (LMP Unknown)   SpO2 98%   BMI 25.41 kg/m   Wt Readings from Last 3 Encounters:  07/15/17 146 lb 3.2 oz (66.3 kg)  06/17/17 145 lb 9.6 oz (66 kg)  05/10/17 147 lb 6.4 oz (66.9 kg)    Physical Exam  Constitutional: She is oriented to person, place, and time. She appears well-developed and well-nourished.  HENT:  Head: Normocephalic and atraumatic.  Eyes: Pupils are equal, round, and reactive to light. Right eye exhibits no discharge. Left eye exhibits no  discharge. No scleral icterus.  Neck: Normal range of motion. Neck supple. Carotid bruit is not present. No thyromegaly present.  Cardiovascular: Normal rate, regular rhythm and normal heart sounds. Exam reveals no gallop and no friction rub.  No murmur heard. Pulmonary/Chest: Effort normal and breath sounds normal. No respiratory distress. She has no wheezes. She has no rales.  Abdominal: Soft. Bowel sounds are normal. There is no tenderness. There is no rebound.  Genitourinary: No breast swelling, tenderness or discharge.  Musculoskeletal: Normal range of motion.  Lymphadenopathy:    She has no cervical adenopathy.  Neurological: She is alert and oriented to person, place, and time.  Skin: Skin is warm, dry and intact. No rash noted.  Psychiatric: She has a normal mood and affect. Her speech is normal and behavior is normal. Judgment and thought content normal. Cognition and memory are normal.    Results for orders placed or performed in visit on 07/06/16  Lipid Panel w/o Chol/HDL Ratio  Result Value Ref Range   Cholesterol, Total 220 (H) 100 - 199 mg/dL   Triglycerides 106 0 - 149 mg/dL   HDL 90 >39 mg/dL   VLDL Cholesterol Cal 21 5 - 40 mg/dL   LDL Calculated 109 (H) 0 - 99 mg/dL  Comprehensive metabolic panel  Result Value Ref Range   Glucose 78 65 - 99 mg/dL   BUN 15 8 - 27 mg/dL   Creatinine, Ser 0.67 0.57 - 1.00 mg/dL   GFR calc non Af Amer 90 >59 mL/min/1.73   GFR calc Af Amer 104 >59 mL/min/1.73   BUN/Creatinine Ratio 22 12 - 28   Sodium 138 134 - 144 mmol/L   Potassium 4.1 3.5 - 5.2 mmol/L   Chloride 98 96 - 106 mmol/L   CO2 25 18 - 29 mmol/L   Calcium 8.8 8.7 - 10.3 mg/dL   Total Protein 6.1 6.0 - 8.5 g/dL   Albumin 3.9 3.6 - 4.8 g/dL   Globulin, Total 2.2 1.5 - 4.5 g/dL   Albumin/Globulin Ratio 1.8 1.2 - 2.2   Bilirubin Total 0.3 0.0 - 1.2 mg/dL   Alkaline Phosphatase 51 39 - 117 IU/L   AST 26 0 - 40 IU/L   ALT 17 0 -  32 IU/L  CBC with Differential/Platelet    Result Value Ref Range   WBC 7.3 3.4 - 10.8 x10E3/uL   RBC 4.08 3.77 - 5.28 x10E6/uL   Hemoglobin 12.1 11.1 - 15.9 g/dL   Hematocrit 36.3 34.0 - 46.6 %   MCV 89 79 - 97 fL   MCH 29.7 26.6 - 33.0 pg   MCHC 33.3 31.5 - 35.7 g/dL   RDW 14.2 12.3 - 15.4 %   Platelets 310 150 - 379 x10E3/uL   Neutrophils 61 Not Estab. %   Lymphs 25 Not Estab. %   Monocytes 10 Not Estab. %   Eos 3 Not Estab. %   Basos 1 Not Estab. %   Neutrophils Absolute 4.5 1.4 - 7.0 x10E3/uL   Lymphocytes Absolute 1.8 0.7 - 3.1 x10E3/uL   Monocytes Absolute 0.8 0.1 - 0.9 x10E3/uL   EOS (ABSOLUTE) 0.2 0.0 - 0.4 x10E3/uL   Basophils Absolute 0.0 0.0 - 0.2 x10E3/uL   Immature Granulocytes 0 Not Estab. %   Immature Grans (Abs) 0.0 0.0 - 0.1 x10E3/uL      Assessment & Plan:   Problem List Items Addressed This Visit      Unprioritized   Advanced care planning/counseling discussion    Has completed ADs from 08/04/16.  Does not want to make changes.  Husband is HCPOA      Anxiety and depression    Controlled with Sertraline at this time      Hyperlipidemia    Not on medication.  Check labs today      Relevant Orders   Lipid Panel w/o Chol/HDL Ratio   Osteoarthritis    Stable, continue present medications.        Relevant Medications   predniSONE (DELTASONE) 10 MG tablet   Pulmonary fibrosis (HCC)    Seeing pulmonary.  Stable.  OK for paperwork here in office       Other Visit Diagnoses    Annual physical exam    -  Primary   Medication monitoring encounter       On several medication.  Needs CMP   Relevant Orders   Comprehensive metabolic panel   CBC with Differential/Platelet   Need for hepatitis C screening test       Relevant Orders   Hepatitis C antibody   Breast cancer screening       Relevant Orders   MM DIGITAL SCREENING BILATERAL       Follow up plan: Return in about 6 months (around 01/12/2018).

## 2017-07-15 NOTE — Assessment & Plan Note (Signed)
Has completed ADs from 08/04/16.  Does not want to make changes.  Husband is HCPOA

## 2017-07-15 NOTE — Assessment & Plan Note (Addendum)
Not on medication.  Check labs today

## 2017-07-15 NOTE — Assessment & Plan Note (Signed)
Stable, continue present medications.

## 2017-07-16 LAB — COMPREHENSIVE METABOLIC PANEL
A/G RATIO: 2 (ref 1.2–2.2)
ALT: 19 IU/L (ref 0–32)
AST: 23 IU/L (ref 0–40)
Albumin: 3.9 g/dL (ref 3.5–4.8)
Alkaline Phosphatase: 45 IU/L (ref 39–117)
BUN/Creatinine Ratio: 12 (ref 12–28)
BUN: 10 mg/dL (ref 8–27)
Bilirubin Total: <0.2 mg/dL (ref 0.0–1.2)
CALCIUM: 8.7 mg/dL (ref 8.7–10.3)
CO2: 21 mmol/L (ref 20–29)
Chloride: 102 mmol/L (ref 96–106)
Creatinine, Ser: 0.86 mg/dL (ref 0.57–1.00)
GFR, EST AFRICAN AMERICAN: 79 mL/min/{1.73_m2} (ref >59)
GFR, EST NON AFRICAN AMERICAN: 69 mL/min/{1.73_m2} (ref >59)
GLOBULIN, TOTAL: 2 g/dL (ref 1.5–4.5)
Glucose: 89 mg/dL (ref 65–99)
POTASSIUM: 4.2 mmol/L (ref 3.5–5.2)
SODIUM: 140 mmol/L (ref 134–144)
TOTAL PROTEIN: 5.9 g/dL — AB (ref 6.0–8.5)

## 2017-07-16 LAB — CBC WITH DIFFERENTIAL/PLATELET
Basophils Absolute: 0 10*3/uL (ref 0.0–0.2)
Basos: 0 %
EOS (ABSOLUTE): 0.2 10*3/uL (ref 0.0–0.4)
EOS: 3 %
HEMATOCRIT: 34.2 % (ref 34.0–46.6)
HEMOGLOBIN: 11.4 g/dL (ref 11.1–15.9)
IMMATURE GRANULOCYTES: 0 %
Immature Grans (Abs): 0 10*3/uL (ref 0.0–0.1)
LYMPHS: 19 %
Lymphocytes Absolute: 1.5 10*3/uL (ref 0.7–3.1)
MCH: 29.5 pg (ref 26.6–33.0)
MCHC: 33.3 g/dL (ref 31.5–35.7)
MCV: 88 fL (ref 79–97)
MONOCYTES: 11 %
Monocytes Absolute: 0.9 10*3/uL (ref 0.1–0.9)
NEUTROS PCT: 67 %
Neutrophils Absolute: 5.3 10*3/uL (ref 1.4–7.0)
Platelets: 255 10*3/uL (ref 150–379)
RBC: 3.87 x10E6/uL (ref 3.77–5.28)
RDW: 14.2 % (ref 12.3–15.4)
WBC: 8 10*3/uL (ref 3.4–10.8)

## 2017-07-16 LAB — LIPID PANEL W/O CHOL/HDL RATIO
Cholesterol, Total: 210 mg/dL — ABNORMAL HIGH (ref 100–199)
HDL: 70 mg/dL (ref >39)
LDL Calculated: 115 mg/dL — ABNORMAL HIGH (ref 0–99)
Triglycerides: 127 mg/dL (ref 0–149)
VLDL Cholesterol Cal: 25 mg/dL (ref 5–40)

## 2017-07-16 LAB — HEPATITIS C ANTIBODY: Hep C Virus Ab: 0.1 s/co ratio (ref 0.0–0.9)

## 2017-07-17 DIAGNOSIS — M545 Low back pain: Secondary | ICD-10-CM | POA: Diagnosis not present

## 2017-07-17 DIAGNOSIS — R262 Difficulty in walking, not elsewhere classified: Secondary | ICD-10-CM | POA: Diagnosis not present

## 2017-07-17 DIAGNOSIS — M7582 Other shoulder lesions, left shoulder: Secondary | ICD-10-CM | POA: Diagnosis not present

## 2017-07-17 DIAGNOSIS — M542 Cervicalgia: Secondary | ICD-10-CM | POA: Diagnosis not present

## 2017-07-30 DIAGNOSIS — M7582 Other shoulder lesions, left shoulder: Secondary | ICD-10-CM | POA: Diagnosis not present

## 2017-07-30 DIAGNOSIS — M545 Low back pain: Secondary | ICD-10-CM | POA: Diagnosis not present

## 2017-07-30 DIAGNOSIS — R262 Difficulty in walking, not elsewhere classified: Secondary | ICD-10-CM | POA: Diagnosis not present

## 2017-07-30 DIAGNOSIS — M542 Cervicalgia: Secondary | ICD-10-CM | POA: Diagnosis not present

## 2017-07-31 DIAGNOSIS — Z79899 Other long term (current) drug therapy: Secondary | ICD-10-CM | POA: Diagnosis not present

## 2017-07-31 DIAGNOSIS — M19041 Primary osteoarthritis, right hand: Secondary | ICD-10-CM | POA: Diagnosis not present

## 2017-07-31 DIAGNOSIS — M359 Systemic involvement of connective tissue, unspecified: Secondary | ICD-10-CM | POA: Diagnosis not present

## 2017-07-31 DIAGNOSIS — J8410 Pulmonary fibrosis, unspecified: Secondary | ICD-10-CM | POA: Diagnosis not present

## 2017-07-31 DIAGNOSIS — I7300 Raynaud's syndrome without gangrene: Secondary | ICD-10-CM | POA: Diagnosis not present

## 2017-07-31 DIAGNOSIS — M19042 Primary osteoarthritis, left hand: Secondary | ICD-10-CM | POA: Diagnosis not present

## 2017-07-31 DIAGNOSIS — R768 Other specified abnormal immunological findings in serum: Secondary | ICD-10-CM | POA: Diagnosis not present

## 2017-07-31 DIAGNOSIS — M539 Dorsopathy, unspecified: Secondary | ICD-10-CM | POA: Diagnosis not present

## 2017-08-01 DIAGNOSIS — M545 Low back pain: Secondary | ICD-10-CM | POA: Diagnosis not present

## 2017-08-01 DIAGNOSIS — M542 Cervicalgia: Secondary | ICD-10-CM | POA: Diagnosis not present

## 2017-08-01 DIAGNOSIS — R262 Difficulty in walking, not elsewhere classified: Secondary | ICD-10-CM | POA: Diagnosis not present

## 2017-08-01 DIAGNOSIS — M7582 Other shoulder lesions, left shoulder: Secondary | ICD-10-CM | POA: Diagnosis not present

## 2017-08-05 DIAGNOSIS — R262 Difficulty in walking, not elsewhere classified: Secondary | ICD-10-CM | POA: Diagnosis not present

## 2017-08-05 DIAGNOSIS — M7582 Other shoulder lesions, left shoulder: Secondary | ICD-10-CM | POA: Diagnosis not present

## 2017-08-05 DIAGNOSIS — M545 Low back pain: Secondary | ICD-10-CM | POA: Diagnosis not present

## 2017-08-05 DIAGNOSIS — M542 Cervicalgia: Secondary | ICD-10-CM | POA: Diagnosis not present

## 2017-08-07 DIAGNOSIS — M545 Low back pain: Secondary | ICD-10-CM | POA: Diagnosis not present

## 2017-08-07 DIAGNOSIS — M7582 Other shoulder lesions, left shoulder: Secondary | ICD-10-CM | POA: Diagnosis not present

## 2017-08-07 DIAGNOSIS — M542 Cervicalgia: Secondary | ICD-10-CM | POA: Diagnosis not present

## 2017-08-07 DIAGNOSIS — R262 Difficulty in walking, not elsewhere classified: Secondary | ICD-10-CM | POA: Diagnosis not present

## 2017-08-09 ENCOUNTER — Other Ambulatory Visit: Payer: Self-pay | Admitting: Unknown Physician Specialty

## 2017-08-09 NOTE — Telephone Encounter (Signed)
Omeprazole refill Last OV: 04/17/17 per telephone note. Next appt with PCP 01/13/18 Last Refill:04/17/17 #30 cap 3 RF Pharmacy:[facility] [place]

## 2017-08-12 DIAGNOSIS — M545 Low back pain: Secondary | ICD-10-CM | POA: Diagnosis not present

## 2017-08-12 DIAGNOSIS — M542 Cervicalgia: Secondary | ICD-10-CM | POA: Diagnosis not present

## 2017-08-12 DIAGNOSIS — M7582 Other shoulder lesions, left shoulder: Secondary | ICD-10-CM | POA: Diagnosis not present

## 2017-08-12 DIAGNOSIS — R262 Difficulty in walking, not elsewhere classified: Secondary | ICD-10-CM | POA: Diagnosis not present

## 2017-08-14 DIAGNOSIS — M545 Low back pain: Secondary | ICD-10-CM | POA: Diagnosis not present

## 2017-08-14 DIAGNOSIS — M7582 Other shoulder lesions, left shoulder: Secondary | ICD-10-CM | POA: Diagnosis not present

## 2017-08-14 DIAGNOSIS — M542 Cervicalgia: Secondary | ICD-10-CM | POA: Diagnosis not present

## 2017-08-14 DIAGNOSIS — R262 Difficulty in walking, not elsewhere classified: Secondary | ICD-10-CM | POA: Diagnosis not present

## 2017-08-20 ENCOUNTER — Ambulatory Visit: Payer: Medicare Other

## 2017-08-26 ENCOUNTER — Ambulatory Visit
Admission: RE | Admit: 2017-08-26 | Discharge: 2017-08-26 | Disposition: A | Discharge status: Home / Self Care | Payer: Medicare Other | Source: Ambulatory Visit | Attending: Unknown Physician Specialty | Admitting: Unknown Physician Specialty

## 2017-08-26 DIAGNOSIS — Z1231 Encounter for screening mammogram for malignant neoplasm of breast: Secondary | ICD-10-CM | POA: Diagnosis not present

## 2017-08-26 DIAGNOSIS — Z1239 Encounter for other screening for malignant neoplasm of breast: Secondary | ICD-10-CM

## 2017-08-29 DIAGNOSIS — R0602 Shortness of breath: Secondary | ICD-10-CM | POA: Diagnosis not present

## 2017-08-29 DIAGNOSIS — Z79899 Other long term (current) drug therapy: Secondary | ICD-10-CM | POA: Diagnosis not present

## 2017-08-29 DIAGNOSIS — J8410 Pulmonary fibrosis, unspecified: Secondary | ICD-10-CM | POA: Diagnosis not present

## 2017-08-29 DIAGNOSIS — M25561 Pain in right knee: Secondary | ICD-10-CM | POA: Diagnosis not present

## 2017-08-29 DIAGNOSIS — M25562 Pain in left knee: Secondary | ICD-10-CM | POA: Diagnosis not present

## 2017-08-29 DIAGNOSIS — Z9889 Other specified postprocedural states: Secondary | ICD-10-CM | POA: Diagnosis not present

## 2017-09-02 ENCOUNTER — Other Ambulatory Visit: Payer: Self-pay | Admitting: Unknown Physician Specialty

## 2017-09-02 DIAGNOSIS — F329 Major depressive disorder, single episode, unspecified: Secondary | ICD-10-CM

## 2017-09-02 DIAGNOSIS — F419 Anxiety disorder, unspecified: Principal | ICD-10-CM

## 2017-09-20 ENCOUNTER — Telehealth: Payer: Self-pay | Admitting: Unknown Physician Specialty

## 2017-09-20 NOTE — Telephone Encounter (Signed)
Copied from [place] [id number]. Topic: Bill or Statement - Patient/Guarantor Inquiry >> Sep 20, 2017  3:31 PM Rutherford Nail, NT wrote: Patient calling and states that the office was supposed to be taking care of a bill for her. States she received the bill again in the mail and wanted to check the status of this. States that she had this issue with her husband. Requests a call from [place] today. CB#: [phone]

## 2017-09-20 NOTE — Telephone Encounter (Signed)
Spoke with patient and advised that I would have the billing department and provider look at the charges and would request corrections if applicable.  Will follow up with patient after receiving information from billing department.

## 2017-10-10 ENCOUNTER — Encounter: Payer: Self-pay | Admitting: Family Medicine

## 2017-10-10 ENCOUNTER — Ambulatory Visit (INDEPENDENT_AMBULATORY_CARE_PROVIDER_SITE_OTHER): Payer: Medicare Other | Admitting: Family Medicine

## 2017-10-10 VITALS — BP 112/64 | HR 70 | Temp 98.3°F | Wt 142.0 lb

## 2017-10-10 DIAGNOSIS — R51 Headache: Secondary | ICD-10-CM | POA: Diagnosis not present

## 2017-10-10 DIAGNOSIS — R55 Syncope and collapse: Secondary | ICD-10-CM | POA: Insufficient documentation

## 2017-10-10 DIAGNOSIS — F419 Anxiety disorder, unspecified: Secondary | ICD-10-CM | POA: Diagnosis not present

## 2017-10-10 DIAGNOSIS — R519 Headache, unspecified: Secondary | ICD-10-CM

## 2017-10-10 LAB — CBC WITH DIFFERENTIAL/PLATELET
Hematocrit: 37.6 % (ref 34.0–46.6)
Hemoglobin: 12.4 g/dL (ref 11.1–15.9)
LYMPHS: 6 %
Lymphocytes Absolute: 0.6 10*3/uL — ABNORMAL LOW (ref 0.7–3.1)
MCH: 30.3 pg (ref 26.6–33.0)
MCHC: 33 g/dL (ref 31.5–35.7)
MCV: 92 fL (ref 79–97)
MID (Absolute): 0.6 10*3/uL (ref 0.1–1.6)
MID: 7 %
NEUTROS ABS: 7.9 10*3/uL — AB (ref 1.4–7.0)
NEUTROS PCT: 87 %
PLATELETS: 217 10*3/uL (ref 150–450)
RBC: 4.09 x10E6/uL (ref 3.77–5.28)
RDW: 13.5 % (ref 12.3–15.4)
WBC: 9.1 10*3/uL (ref 3.4–10.8)

## 2017-10-10 NOTE — Progress Notes (Signed)
BP 112/64 (BP Location: Right Arm, Patient Position: Sitting, Cuff Size: Normal)   Pulse 70   Temp 98.3 F (36.8 C) (Oral)   Wt 142 lb (64.4 kg)   LMP  (LMP Unknown)   SpO2 96%   BMI 24.68 kg/m    Subjective:    Patient ID: Melanie Rollins, female    DOB: 1946-02-18, 72 y.o.   MRN: [id number]  HPI: Melanie Rollins is a 72 y.o. female  Chief Complaint  Patient presents with  . Dizziness    Ongoing for 6 weeks  . Headache  . Otalgia   EAR PAIN Duration: 1 week Involved ear(s): right Severity:  severe  Quality:  Sharp shooting pain Fever: no Otorrhea: no Upper respiratory infection symptoms: no Pruritus: no Hearing loss: no Water immersion no Using Q-tips: no Recurrent otitis media: no Status: worse Treatments attempted: none  DIZZINESS Duration: 6 weeks with near syncope Description of symptoms: lightheaded, loopy Duration of episode: 5 minutes or so Dizziness frequency: couple of times a week Provoking factors: unknown Aggravating factors:  unknown Triggered by rolling over in bed: no Triggered by bending over: no Aggravated by head movement: no Aggravated by exertion, coughing, loud noises: no Recent head injury: no Recent or current viral symptoms: no History of vasovagal episodes: no Nausea: no Vomiting: no Tinnitus: no Hearing loss: no Aural fullness: yes Headache: yes Photophobia/phonophobia: no Unsteady gait: yes Postural instability: no Diplopia, dysarthria, dysphagia or weakness: no Related to exertion: no Pallor: no Diaphoresis: no Dyspnea: no Chest pain: no  Relevant past medical, surgical, family and social history reviewed and updated as indicated. Interim medical history since our last visit reviewed. Allergies and medications reviewed and updated.  Review of Systems  Constitutional: Negative.   HENT: Positive for ear pain. Negative for congestion, dental problem, drooling, ear discharge, facial swelling, hearing loss, mouth  sores, nosebleeds, postnasal drip, rhinorrhea, sinus pressure, sinus pain, sneezing, sore throat, tinnitus, trouble swallowing and voice change.   Respiratory: Negative.   Cardiovascular: Negative.   Gastrointestinal: Negative.   Musculoskeletal: Negative.   Neurological: Positive for dizziness, light-headedness and headaches. Negative for tremors, seizures, syncope, facial asymmetry, speech difficulty, weakness and numbness.  Hematological: Negative.   Psychiatric/Behavioral: Negative.     Per HPI unless specifically indicated above     Objective:    BP 112/64 (BP Location: Right Arm, Patient Position: Sitting, Cuff Size: Normal)   Pulse 70   Temp 98.3 F (36.8 C) (Oral)   Wt 142 lb (64.4 kg)   LMP  (LMP Unknown)   SpO2 96%   BMI 24.68 kg/m   Wt Readings from Last 3 Encounters:  10/10/17 142 lb (64.4 kg)  07/15/17 146 lb 3.2 oz (66.3 kg)  07/15/17 146 lb 3.2 oz (66.3 kg)    Physical Exam  Constitutional: She is oriented to person, place, and time. She appears well-developed and well-nourished. No distress.  HENT:  Head: Normocephalic and atraumatic.  Right Ear: Hearing, tympanic membrane, external ear and ear canal normal.  Left Ear: Hearing, tympanic membrane, external ear and ear canal normal.  Nose: Nose normal.  Mouth/Throat: Uvula is midline, oropharynx is clear and moist and mucous membranes are normal.  No tenderness to palpation of the temporal arteries  Eyes: Pupils are equal, round, and reactive to light. Conjunctivae and lids are normal. Right eye exhibits no discharge. Left eye exhibits no discharge. No scleral icterus. Right eye exhibits normal extraocular motion and no nystagmus. Left eye exhibits nystagmus.  Left eye exhibits normal extraocular motion. Right pupil is round and reactive. Left pupil is round and reactive. Pupils are equal.  Neck: Normal range of motion. Neck supple. No JVD present. No neck rigidity. No tracheal deviation present. No Brudzinski's  sign and no Kernig's sign noted.  Cardiovascular: Normal rate, regular rhythm, normal heart sounds and intact distal pulses. Exam reveals no gallop and no friction rub.  No murmur heard. Pulmonary/Chest: Effort normal and breath sounds normal. No stridor. No respiratory distress. She has no wheezes. She has no rales. She exhibits no tenderness.  Musculoskeletal: Normal range of motion.  Lymphadenopathy:    She has no cervical adenopathy.  Neurological: She is alert and oriented to person, place, and time. She has normal strength and normal reflexes. She is not disoriented. She displays no atrophy, no tremor and normal reflexes. No cranial nerve deficit or sensory deficit. She exhibits normal muscle tone. She displays a negative Romberg sign. She displays no seizure activity. Coordination and gait normal. She displays no Babinski's sign on the right side. She displays no Babinski's sign on the left side.  Skin: Skin is warm, dry and intact. Capillary refill takes less than 2 seconds. No rash noted. She is not diaphoretic. No cyanosis or erythema. No pallor.  Psychiatric: She has a normal mood and affect. Her speech is normal and behavior is normal. Judgment and thought content normal. Her mood appears not anxious. She is not agitated. Cognition and memory are normal.  Nursing note and vitals reviewed.   Results for orders placed or performed in visit on 10/10/17  CBC With Differential/Platelet  Result Value Ref Range   WBC 9.1 3.4 - 10.8 x10E3/uL   RBC 4.09 3.77 - 5.28 x10E6/uL   Hemoglobin 12.4 11.1 - 15.9 g/dL   Hematocrit 37.6 34.0 - 46.6 %   MCV 92 79 - 97 fL   MCH 30.3 26.6 - 33.0 pg   MCHC 33.0 31.5 - 35.7 g/dL   RDW 13.5 12.3 - 15.4 %   Platelets 217 150 - 450 x10E3/uL   Neutrophils 87 Not Estab. %   Lymphs 6 Not Estab. %   MID 7 Not Estab. %   Neutrophils Absolute 7.9 (H) 1.4 - 7.0 x10E3/uL   Lymphocytes Absolute 0.6 (L) 0.7 - 3.1 x10E3/uL   MID (Absolute) 0.6 0.1 - 1.6 X10E3/uL        Assessment & Plan:   Problem List Items Addressed This Visit      Cardiovascular and Mediastinum   Near syncope - Primary    Of unclear etiology. Orthostatics normal. EKG showed some flipped t-waves- will get her into cardiology for evaluation. Checking labs, not anemic. + nystagmus to the L- will give her epley's manuvers at home. Recheck 2 weeks. Call with any concerns and await results.       Relevant Orders   Comprehensive metabolic panel   CBC With Differential/Platelet (Completed)   EKG 12-Lead (Completed)   Ambulatory referral to Cardiology     Other   Anxiety    Stable. Checking labs today.      Relevant Orders   TSH    Other Visit Diagnoses    Facial pain       Exam normal. Will check ESR to r/o temporal arteritis. Await results.    Relevant Orders   Sed Rate (ESR)       Follow up plan: Return in about 2 weeks (around 10/24/2017) for follow up dizziness.

## 2017-10-10 NOTE — Patient Instructions (Signed)
Near-Syncope Near-syncope is when you suddenly become weak or dizzy, or you feel like you might pass out (faint). During an episode of near-syncope, you may:  Feel dizzy or light-headed.  Feel nauseous.  See all white or all black in your field of vision.  Have cold, clammy skin.  This condition is caused by a sudden decrease in blood flow to the brain. This decrease can result from various causes, but most of those causes are not dangerous. However, near-syncope can be a sign of a serious medical problem, so it is important to seek medical care. If you fainted, get medical help right away.Call your local emergency services (911 in the [place] Do not drive yourself to the hospital. Follow these instructions at home: Pay attention to any changes in your symptoms. Take these actions to help with your condition:  Have someone stay with you until you feel stable.  Do not drive, use machinery, or play sports until your health care provider says it is okay.  Keep all follow-up visits as told by your health care provider. This is important.  If you start to feel like you might faint, lie down right away and raise (elevate) your feet above the level of your heart. Breathe deeply and steadily. Wait until all of the symptoms have passed.  Drink enough fluid to keep your urine clear or pale yellow.  If you are taking blood pressure or heart medicine, get up slowly and take several minutes to sit and then stand. This can reduce dizziness.  Take over-the-counter and prescription medicines only as told by your health care provider.  Get help right away if:  You have a severe headache.  You have unusual pain in your chest, abdomen, or back.  You are bleeding from your mouth or rectum, or you have black or tarry stool.  You have a very fast or irregular heartbeat (palpitations).  You faint once or repeatedly.  You have a seizure.  You are confused.  You have trouble walking.  You have  severe weakness.  You have vision problems. These symptoms may represent a serious problem that is an emergency. Do not wait to see if your symptoms will go away. Get medical help right away. Call your local emergency services (911 in the [place] Do not drive yourself to the hospital. This information is not intended to replace advice given to you by your health care provider. Make sure you discuss any questions you have with your health care provider. Document Released: 05/07/2005 Document Revised: 10/13/2015 Document Reviewed: 01/19/2015 Elsevier Interactive Patient Education  2017 Elsevier Inc. Temporal Arteritis Temporal arteritis, also called giant cell arteritis, is a condition that causes arteries to become swollen (inflamed). It usually affects arteries in your head and face, but arteries in any part of the body can become inflamed. Temporal arteritis can cause serious problems, such as bone loss, diabetes, and blindness. What are the causes? The cause is unknown. What increases the risk?  Being older than 44.  Being a woman.  Being Caucasian.  Being of [place], [place], [place], [place] or [place] ancestry.  Having polymyalgia rheumatica (PMR). What are the signs or symptoms? Some people with temporal arteritis have just one symptom, while other have several symptoms. Most signs and symptoms are related to the head and face. Signs and symptoms may include:  Hard or swollen temples (common). Your temples are the flattened area on either side of your forehead. If your temples are swollen, it may hurt to touch  them.  Pain when combing your hair or when laying your head down.  Pain in the jaw when chewing.  Pain in the throat or tongue.  Problems with your vision, such as sudden loss of vision in one eye, or seeing double.  Fever.  Fatigue.  A dry cough.  Pain in the hips and shoulders.  Pain in the arms during exercise.  Depression.  Weight loss.  How is  this diagnosed? Your health care provider will ask about your symptoms and do a physical exam. He or she may also perform an eye exam and tests, such as:  A complete blood count.  An erythrocyte sedimentation rate test, also called the sed rate test.  A C-reactive protein (CRP) test.  A tissue sample (biopsy) test.  How is this treated? Temporal arteritis is treated with a type of medicine called a corticosteroid. Vision problems may be treated with additional medicines. You will need to see your health care provider while you are being treated. During follow-up visits, your health care provider will check for problems by:  Performing blood tests and bone density tests.  Checking your blood pressure and blood sugar.  Follow these instructions at home:  Take medicines only as directed by your health care provider.  Take any vitamins or supplements that your health care provider suggests. These may include vitamin D and calcium, which help keep your bones from becoming weak.  Exercise. Talk with your health care provider about what exercises are okay for you to do. Usually exercises that increase your heart rate (aerobic exercise), such as walking, are recommended. Aerobic exercise helps control your blood pressure and prevent bone loss.  Follow a healthy diet. Include healthy sources of protein, fruits, vegetables, and whole grains in your diet. Following a healthy diet helps prevent bone damage and diabetes. Contact a health care provider if:  Your symptoms get worse.  Your fever, fatigue, headache, weight loss, or pain in your jaw gets worse.  You develop signs of infection, such as fever, swelling, redness, warmth, and tenderness. Get help right away if:  Your vision gets worse.  Your pain does not go away, even after you take pain medicine.  You have chest pain.  You have trouble breathing.  One side of your face or body suddenly becomes weak or numb. This information  is not intended to replace advice given to you by your health care provider. Make sure you discuss any questions you have with your health care provider. Document Released: 03/04/2009 Document Revised: 01/05/2016 Document Reviewed: 07/01/2013 Elsevier Interactive Patient Education  [employer].

## 2017-10-10 NOTE — Assessment & Plan Note (Signed)
Of unclear etiology. Orthostatics normal. EKG showed some flipped t-waves- will get her into cardiology for evaluation. Checking labs, not anemic. + nystagmus to the L- will give her epley's manuvers at home. Recheck 2 weeks. Call with any concerns and await results.

## 2017-10-10 NOTE — Assessment & Plan Note (Signed)
Stable. Checking labs today.

## 2017-10-11 LAB — COMPREHENSIVE METABOLIC PANEL
ALT: 39 IU/L — AB (ref 0–32)
AST: 45 IU/L — AB (ref 0–40)
Albumin/Globulin Ratio: 1.8 (ref 1.2–2.2)
Albumin: 4.1 g/dL (ref 3.5–4.8)
Alkaline Phosphatase: 51 IU/L (ref 39–117)
BILIRUBIN TOTAL: 0.3 mg/dL (ref 0.0–1.2)
BUN/Creatinine Ratio: 10 — ABNORMAL LOW (ref 12–28)
BUN: 9 mg/dL (ref 8–27)
CALCIUM: 9.3 mg/dL (ref 8.7–10.3)
CHLORIDE: 99 mmol/L (ref 96–106)
CO2: 20 mmol/L (ref 20–29)
Creatinine, Ser: 0.88 mg/dL (ref 0.57–1.00)
GFR calc non Af Amer: 67 mL/min/{1.73_m2} (ref >59)
GFR, EST AFRICAN AMERICAN: 77 mL/min/{1.73_m2} (ref >59)
GLUCOSE: 112 mg/dL — AB (ref 65–99)
Globulin, Total: 2.3 g/dL (ref 1.5–4.5)
POTASSIUM: 4.2 mmol/L (ref 3.5–5.2)
Sodium: 133 mmol/L — ABNORMAL LOW (ref 134–144)
TOTAL PROTEIN: 6.4 g/dL (ref 6.0–8.5)

## 2017-10-11 LAB — TSH: TSH: 1 u[IU]/mL (ref 0.450–4.500)

## 2017-10-15 ENCOUNTER — Other Ambulatory Visit: Payer: Medicare Other

## 2017-10-15 DIAGNOSIS — R519 Headache, unspecified: Secondary | ICD-10-CM

## 2017-10-15 DIAGNOSIS — R51 Headache: Principal | ICD-10-CM

## 2017-10-16 LAB — SEDIMENTATION RATE: Sed Rate: 2 mm/hr (ref 0–40)

## 2017-10-17 ENCOUNTER — Encounter: Payer: Self-pay | Admitting: Family Medicine

## 2017-10-18 MED ORDER — VALACYCLOVIR HCL 1 G PO TABS: 1000.0000 mg | ORAL_TABLET | Freq: Two times a day (BID) | ORAL | 0 refills | Status: DC

## 2017-10-22 DIAGNOSIS — R55 Syncope and collapse: Secondary | ICD-10-CM | POA: Diagnosis not present

## 2017-10-22 DIAGNOSIS — R0602 Shortness of breath: Secondary | ICD-10-CM | POA: Diagnosis not present

## 2017-10-22 DIAGNOSIS — I480 Paroxysmal atrial fibrillation: Secondary | ICD-10-CM | POA: Diagnosis not present

## 2017-10-22 DIAGNOSIS — I2510 Atherosclerotic heart disease of native coronary artery without angina pectoris: Secondary | ICD-10-CM | POA: Diagnosis not present

## 2017-10-23 DIAGNOSIS — I2510 Atherosclerotic heart disease of native coronary artery without angina pectoris: Secondary | ICD-10-CM | POA: Insufficient documentation

## 2017-10-23 DIAGNOSIS — R0602 Shortness of breath: Secondary | ICD-10-CM | POA: Insufficient documentation

## 2017-10-25 ENCOUNTER — Other Ambulatory Visit: Payer: Self-pay | Admitting: Unknown Physician Specialty

## 2017-10-25 NOTE — Telephone Encounter (Signed)
ambien refill Last Refill:"end date 07/15/17" Last OV: 07/15/17 stated that pt was not taking Lorrin Mais PCP: [facility]

## 2017-10-28 ENCOUNTER — Encounter: Payer: Self-pay | Admitting: Family Medicine

## 2017-10-28 ENCOUNTER — Ambulatory Visit (INDEPENDENT_AMBULATORY_CARE_PROVIDER_SITE_OTHER): Payer: Medicare Other | Admitting: Family Medicine

## 2017-10-28 VITALS — BP 103/64 | HR 81 | Temp 97.6°F | Ht 63.0 in | Wt 141.0 lb

## 2017-10-28 DIAGNOSIS — J8410 Pulmonary fibrosis, unspecified: Secondary | ICD-10-CM

## 2017-10-28 DIAGNOSIS — R55 Syncope and collapse: Secondary | ICD-10-CM

## 2017-10-28 DIAGNOSIS — M359 Systemic involvement of connective tissue, unspecified: Secondary | ICD-10-CM | POA: Diagnosis not present

## 2017-10-28 NOTE — Assessment & Plan Note (Signed)
Stable. Continue to follow with rheumatology. Call with any concerns.

## 2017-10-28 NOTE — Assessment & Plan Note (Signed)
Will send copy of her CBC to pulmonology. Still SOB and fatigued, but O2 levels good. Will undergo cardiac testing, if nothing found, will work up further.

## 2017-10-28 NOTE — Progress Notes (Signed)
BP 103/64 (BP Location: Right Arm, Patient Position: Sitting, Cuff Size: Normal)   Pulse 81   Temp 97.6 F (36.4 C) (Oral)   Ht 5' 3" (1.6 m)   Wt 141 lb (64 kg)   LMP  (LMP Unknown)   SpO2 98%   BMI 24.98 kg/m    Subjective:    Patient ID: Melanie Rollins, female    DOB: [birth year], 72 y.o.   MRN: [id number]  HPI: Melanie Rollins is a 72 y.o. female  Chief Complaint  Patient presents with  . Follow-up   Here today for follow up on her near syncope. Saw Dr. Ubaldo Glassing 4 days ago and to have Stress test done as well as ECHO, they are considering doing a holter as well. They are also getting her set up with a over night pulse-oxymetry to evaluate for nocturnal hypoxia.  Shooting pain have stopped and dizziness has been better. She is otherwise feeling well. She does need a copy of her CBC sent to her pulmonologist. No other concerns.  Relevant past medical, surgical, family and social history reviewed and updated as indicated. Interim medical history since our last visit reviewed. Allergies and medications reviewed and updated.  Review of Systems  Constitutional: Negative.   Respiratory: Positive for shortness of breath. Negative for apnea, cough, choking, chest tightness, wheezing and stridor.   Cardiovascular: Negative.   Neurological: Negative.   Psychiatric/Behavioral: Negative.     Per HPI unless specifically indicated above     Objective:    BP 103/64 (BP Location: Right Arm, Patient Position: Sitting, Cuff Size: Normal)   Pulse 81   Temp 97.6 F (36.4 C) (Oral)   Ht 5' 3" (1.6 m)   Wt 141 lb (64 kg)   LMP  (LMP Unknown)   SpO2 98%   BMI 24.98 kg/m   Wt Readings from Last 3 Encounters:  10/28/17 141 lb (64 kg)  10/10/17 142 lb (64.4 kg)  07/15/17 146 lb 3.2 oz (66.3 kg)    Physical Exam  Constitutional: She is oriented to person, place, and time. She appears well-developed and well-nourished. No distress.  HENT:  Head: Normocephalic and atraumatic.  Right  Ear: Hearing normal.  Left Ear: Hearing normal.  Nose: Nose normal.  Eyes: Conjunctivae and lids are normal. Right eye exhibits no discharge. Left eye exhibits no discharge. No scleral icterus.  Cardiovascular: Normal rate, regular rhythm, normal heart sounds and intact distal pulses. Exam reveals no gallop and no friction rub.  No murmur heard. Pulmonary/Chest: Effort normal and breath sounds normal. No stridor. No respiratory distress. She has no wheezes. She has no rales. She exhibits no tenderness.  Musculoskeletal: Normal range of motion.  Neurological: She is alert and oriented to person, place, and time.  Skin: Skin is warm, dry and intact. Capillary refill takes less than 2 seconds. No rash noted. She is not diaphoretic. No erythema. No pallor.  Psychiatric: She has a normal mood and affect. Her speech is normal and behavior is normal. Judgment and thought content normal. Cognition and memory are normal.  Nursing note and vitals reviewed.   Results for orders placed or performed in visit on 10/15/17  Sed Rate (ESR)  Result Value Ref Range   Sed Rate 2 0 - 40 mm/hr      Assessment & Plan:   Problem List Items Addressed This Visit      Cardiovascular and Mediastinum   Near syncope - Primary    Has not had  any episodes since last visit. Appreciate Dr. Fath's input. Will await results. Call with any concerns.         Respiratory   Pulmonary fibrosis (HCC)    Will send copy of her CBC to pulmonology. Still SOB and fatigued, but O2 levels good. Will undergo cardiac testing, if nothing found, will work up further.         Other   Connective tissue disease (HCC)    Stable. Continue to follow with rheumatology. Call with any concerns.           Follow up plan: Return As scheduled.      

## 2017-10-28 NOTE — Assessment & Plan Note (Signed)
Has not had any episodes since last visit. Appreciate Dr. Bethanne Ginger input. Will await results. Call with any concerns.

## 2017-11-04 DIAGNOSIS — R0600 Dyspnea, unspecified: Secondary | ICD-10-CM | POA: Diagnosis not present

## 2017-11-07 DIAGNOSIS — I2510 Atherosclerotic heart disease of native coronary artery without angina pectoris: Secondary | ICD-10-CM | POA: Diagnosis not present

## 2017-11-07 DIAGNOSIS — R0602 Shortness of breath: Secondary | ICD-10-CM | POA: Diagnosis not present

## 2017-11-14 DIAGNOSIS — M545 Low back pain: Secondary | ICD-10-CM | POA: Diagnosis not present

## 2017-11-14 DIAGNOSIS — M4317 Spondylolisthesis, lumbosacral region: Secondary | ICD-10-CM | POA: Diagnosis not present

## 2017-11-24 ENCOUNTER — Encounter: Payer: Self-pay | Admitting: Family Medicine

## 2017-11-27 NOTE — Telephone Encounter (Signed)
Patient is coming in tomorrow for appointment with Dr Wynetta Emery  Melanie Rollins

## 2017-11-28 ENCOUNTER — Ambulatory Visit (INDEPENDENT_AMBULATORY_CARE_PROVIDER_SITE_OTHER): Payer: Medicare Other | Admitting: Family Medicine

## 2017-11-28 ENCOUNTER — Encounter: Payer: Self-pay | Admitting: Family Medicine

## 2017-11-28 VITALS — BP 113/60 | HR 87 | Temp 98.3°F | Wt 143.2 lb

## 2017-11-28 DIAGNOSIS — R519 Headache, unspecified: Secondary | ICD-10-CM

## 2017-11-28 DIAGNOSIS — R51 Headache: Secondary | ICD-10-CM

## 2017-11-28 MED ORDER — NORTRIPTYLINE HCL 25 MG PO CAPS: 25.0000 mg | ORAL_CAPSULE | Freq: Every day | ORAL | 3 refills | Status: DC

## 2017-11-28 MED ORDER — TIZANIDINE HCL 4 MG PO TABS: 4.0000 mg | ORAL_TABLET | Freq: Three times a day (TID) | ORAL | 1 refills | Status: DC | PRN

## 2017-11-28 NOTE — Progress Notes (Signed)
BP 113/60 (BP Location: Left Arm, Patient Position: Sitting, Cuff Size: Normal)   Pulse 87   Temp 98.3 F (36.8 C)   Wt 143 lb 4 oz (65 kg)   LMP  (LMP Unknown)   SpO2 96%   BMI 25.38 kg/m    Subjective:    Patient ID: Melanie Rollins, female    DOB: 30-Mar-1946, 72 y.o.   MRN: [id number]  HPI: Melanie Rollins is a 72 y.o. female  Chief Complaint  Patient presents with  . Headache   About  9 months ago she started having some issues with her neck. Has pain in her jaw and into her neck and ear. Did PT for her shoulder primarily but also her neck about 6 months ago. Helped her shoulder a lot, but not much on the neck. She notes that her face feels numb and raw- like someone took sand paper off of it. Some issues with balance, some issues with grip strength, no weakness Status: exacerbated Treatments attempted: rest, ice, heat, APAP, ibuprofen, aleve and muscle relaxer  Compliant with recommended treatment: yes Relief with NSAIDs?:  mild Location:Right Duration:chronic Severity: moderate Quality: burning and shooting Frequency: constant Radiation: none Aggravating factors: movement Alleviating factors: rest, NSAIDs, APAP, narcotics and muscle relaxer Weakness:  no Paresthesias / decreased sensation:  yes  Fevers:  no   Relevant past medical, surgical, family and social history reviewed and updated as indicated. Interim medical history since our last visit reviewed. Allergies and medications reviewed and updated.  Review of Systems  Constitutional: Negative.   Respiratory: Negative.   Cardiovascular: Negative.   Neurological: Positive for dizziness, weakness, numbness and headaches. Negative for tremors, seizures, syncope, facial asymmetry, speech difficulty and light-headedness.  Psychiatric/Behavioral: Negative.     Per HPI unless specifically indicated above     Objective:    BP 113/60 (BP Location: Left Arm, Patient Position: Sitting, Cuff Size: Normal)   Pulse  87   Temp 98.3 F (36.8 C)   Wt 143 lb 4 oz (65 kg)   LMP  (LMP Unknown)   SpO2 96%   BMI 25.38 kg/m   Wt Readings from Last 3 Encounters:  11/28/17 143 lb 4 oz (65 kg)  10/28/17 141 lb (64 kg)  10/10/17 142 lb (64.4 kg)    Physical Exam  Constitutional: She is oriented to person, place, and time. She appears well-developed and well-nourished.  Non-toxic appearance. She does not appear ill. No distress.  HENT:  Head: Normocephalic and atraumatic.  Right Ear: Hearing normal.  Left Ear: Hearing normal.  Nose: Nose normal.  No tenderness over temporal arteries, negative Tinel's over facial nerves  Eyes: Conjunctivae and lids are normal. Right eye exhibits no discharge. Left eye exhibits no discharge. No scleral icterus.  Cardiovascular: Normal rate, regular rhythm, normal heart sounds and intact distal pulses. Exam reveals no gallop and no friction rub.  No murmur heard. Pulmonary/Chest: Effort normal and breath sounds normal. No stridor. No respiratory distress. She has no wheezes. She has no rales. She exhibits no tenderness.  Musculoskeletal: Normal range of motion.  Neurological: She is alert and oriented to person, place, and time. She has normal strength. She is not disoriented. She displays normal reflexes. No cranial nerve deficit or sensory deficit. She displays a negative Romberg sign. Coordination and gait normal. She displays no Babinski's sign on the right side. She displays no Babinski's sign on the left side.  Skin: Skin is intact. No rash noted.  Psychiatric:  She has a normal mood and affect. Her speech is normal and behavior is normal. Judgment and thought content normal. Her mood appears not anxious. She is not agitated. Cognition and memory are normal.  Nursing note and vitals reviewed.   Results for orders placed or performed in visit on 10/15/17  Sed Rate (ESR)  Result Value Ref Range   Sed Rate 2 0 - 40 mm/hr      Assessment & Plan:   Problem List Items  Addressed This Visit    None    Visit Diagnoses    Facial pain    -  Primary   Concern for trigeminal neuralgia. Will start tizanidine to see if it's due to muscle spasm and will start nortriptyline if not better. Call with any concerns.    Relevant Orders   Sed Rate (ESR)       Follow up plan: Return 2 weeks, for follow up.

## 2017-11-28 NOTE — Patient Instructions (Signed)
Trigeminal Neuralgia Trigeminal neuralgia is a nerve disorder that causes attacks of severe facial pain. The attacks last from a few seconds to several minutes. They can happen for days, weeks, or months and then go away for months or years. Trigeminal neuralgia is also called tic douloureux. What are the causes? This condition is caused by damage to a nerve in the face that is called the trigeminal nerve. An attack can be triggered by:  Talking.  Chewing.  Putting on makeup.  Washing your face.  Shaving your face.  Brushing your teeth.  Touching your face.  What increases the risk? This condition is more likely to develop in:  Women.  People who are 86 years of age or older.  What are the signs or symptoms? The main symptom of this condition is pain in the jaw, lips, eyes, nose, scalp, forehead, and face. The pain may be intense, stabbing, electric, or shock-like. How is this diagnosed? This condition is diagnosed with a physical exam. A CT scan or MRI may be done to rule out other conditions that can cause facial pain. How is this treated? This condition may be treated with:  Avoiding the things that trigger your attacks.  Pain medicine.  Surgery. This may be done in severe cases if other medical treatment does not provide relief.  Follow these instructions at home:  Take over-the-counter and prescription medicines only as told by your health care provider.  If you wish to get pregnant, talk with your health care provider before you start trying to get pregnant.  Avoid the things that trigger your attacks. It may help to: ? Chew on the unaffected side of your mouth. ? Avoid touching your face. ? Avoid blasts of hot or cold air. Contact a health care provider if:  Your pain medicine is not helping.  You develop new, unexplained symptoms, such as: ? Double vision. ? Facial weakness. ? Changes in hearing or balance.  You become pregnant. Get help right away  if:  Your pain is unbearable, and your pain medicine does not help. This information is not intended to replace advice given to you by your health care provider. Make sure you discuss any questions you have with your health care provider. Document Released: 05/04/2000 Document Revised: 01/08/2016 Document Reviewed: 08/30/2014 Elsevier Interactive Patient Education  [employer].

## 2017-11-29 LAB — SEDIMENTATION RATE: Sed Rate: 4 mm/hr (ref 0–40)

## 2017-12-03 DIAGNOSIS — R0789 Other chest pain: Secondary | ICD-10-CM | POA: Diagnosis not present

## 2017-12-03 DIAGNOSIS — R55 Syncope and collapse: Secondary | ICD-10-CM | POA: Diagnosis not present

## 2017-12-03 DIAGNOSIS — I2510 Atherosclerotic heart disease of native coronary artery without angina pectoris: Secondary | ICD-10-CM | POA: Diagnosis not present

## 2017-12-03 DIAGNOSIS — J8410 Pulmonary fibrosis, unspecified: Secondary | ICD-10-CM | POA: Diagnosis not present

## 2017-12-06 ENCOUNTER — Other Ambulatory Visit: Payer: Self-pay | Admitting: Unknown Physician Specialty

## 2017-12-10 DIAGNOSIS — Z872 Personal history of diseases of the skin and subcutaneous tissue: Secondary | ICD-10-CM | POA: Diagnosis not present

## 2017-12-10 DIAGNOSIS — L578 Other skin changes due to chronic exposure to nonionizing radiation: Secondary | ICD-10-CM | POA: Diagnosis not present

## 2017-12-10 DIAGNOSIS — Z1283 Encounter for screening for malignant neoplasm of skin: Secondary | ICD-10-CM | POA: Diagnosis not present

## 2017-12-10 DIAGNOSIS — L720 Epidermal cyst: Secondary | ICD-10-CM | POA: Diagnosis not present

## 2017-12-13 ENCOUNTER — Encounter: Payer: Self-pay | Admitting: Unknown Physician Specialty

## 2017-12-13 ENCOUNTER — Ambulatory Visit (INDEPENDENT_AMBULATORY_CARE_PROVIDER_SITE_OTHER): Payer: Medicare Other | Admitting: Unknown Physician Specialty

## 2017-12-13 VITALS — BP 110/69 | HR 64 | Temp 98.4°F | Ht 63.0 in | Wt 142.8 lb

## 2017-12-13 DIAGNOSIS — R748 Abnormal levels of other serum enzymes: Secondary | ICD-10-CM

## 2017-12-13 DIAGNOSIS — M542 Cervicalgia: Secondary | ICD-10-CM | POA: Diagnosis not present

## 2017-12-13 DIAGNOSIS — R51 Headache: Secondary | ICD-10-CM

## 2017-12-13 DIAGNOSIS — R519 Headache, unspecified: Secondary | ICD-10-CM | POA: Insufficient documentation

## 2017-12-13 NOTE — Progress Notes (Signed)
BP 110/69   Pulse 64   Temp 98.4 F (36.9 C) (Oral)   Ht _0  (1.6 m)   Wt 142 lb 12.8 oz (64.8 kg)   LMP  (LMP Unknown)   SpO2 98%   BMI 25.30 kg/m    Subjective:    Patient ID: Melanie Rollins, female    DOB: [birth year], 72 y.o.   MRN: [id number]  HPI: Melanie Rollins is a 72 y.o. female  Chief Complaint  Patient presents with  . Facial Pain    2 week f/up- pt states the tizanidine is helping   Facial pain Pt states that the Tizanidine has improved facial pain.  Symptoms come and go.  States her night vision is not as good.  ESR was negative 2.    Relevant past medical, surgical, family and social history reviewed and updated as indicated. Interim medical history since our last visit reviewed. Allergies and medications reviewed and updated.  Review of Systems  Per HPI unless specifically indicated above     Objective:    BP 110/69   Pulse 64   Temp 98.4 F (36.9 C) (Oral)   Ht _1  (1.6 m)   Wt 142 lb 12.8 oz (64.8 kg)   LMP  (LMP Unknown)   SpO2 98%   BMI 25.30 kg/m   Wt Readings from Last 3 Encounters:  12/13/17 142 lb 12.8 oz (64.8 kg)  11/28/17 143 lb 4 oz (65 kg)  10/28/17 141 lb (64 kg)    Physical Exam  Constitutional: She is oriented to person, place, and time. She appears well-developed and well-nourished. No distress.  HENT:  Head: Normocephalic and atraumatic.  Eyes: Conjunctivae and lids are normal. Right eye exhibits no discharge. Left eye exhibits no discharge. No scleral icterus.  Neck: Normal range of motion. Neck supple. No JVD present. Carotid bruit is not present.  Cardiovascular: Normal rate, regular rhythm and normal heart sounds.  Pulmonary/Chest: Effort normal and breath sounds normal.  Abdominal: Normal appearance. There is no splenomegaly or hepatomegaly.  Musculoskeletal: Normal range of motion.  Neurological: She is alert and oriented to person, place, and time.  Skin: Skin is warm, dry and intact. No rash noted. No pallor.   Psychiatric: She has a normal mood and affect. Her behavior is normal. Judgment and thought content normal.    Results for orders placed or performed in visit on 11/28/17  Sed Rate (ESR)  Result Value Ref Range   Sed Rate 4 0 - 40 mm/hr      Assessment & Plan:   Problem List Items Addressed This Visit      Unprioritized   Cervical pain (neck)    X-ray shows DDD.  Will consult with Dr. Jefm Bryant.  Tylenol and Tizanidine are helping      Elevated liver enzymes - Primary    Noted on last visit.  Repeat CMP.  She does take Tylenol to help with pain      Relevant Orders   Comprehensive metabolic panel   Facial pain    Probably related to neck issues with the improvement with Tizanidine.  However, has several auto-immune issues and high index of suspicion for temporal arteritis.  Will recheck sed rate and CRP.  Sees Dr Jefm Bryant in about 1 month.        Relevant Orders   Sed Rate (ESR)   C-reactive protein       Follow up plan: Return in about 3 months (around 03/15/2018).

## 2017-12-13 NOTE — Assessment & Plan Note (Signed)
Noted on last visit.  Repeat CMP.  She does take Tylenol to help with pain

## 2017-12-13 NOTE — Assessment & Plan Note (Addendum)
Probably related to neck issues with the improvement with Tizanidine.  However, has several auto-immune issues and high index of suspicion for temporal arteritis.  Will recheck sed rate and CRP.  Sees Dr Jefm Bryant in about 1 month.

## 2017-12-13 NOTE — Assessment & Plan Note (Signed)
X-ray shows DDD.  Will consult with Dr. Jefm Bryant.  Tylenol and Tizanidine are helping

## 2017-12-14 LAB — COMPREHENSIVE METABOLIC PANEL
ALT: 17 IU/L (ref 0–32)
AST: 28 IU/L (ref 0–40)
Albumin/Globulin Ratio: 1.8 (ref 1.2–2.2)
Albumin: 4.2 g/dL (ref 3.5–4.8)
Alkaline Phosphatase: 57 IU/L (ref 39–117)
BUN/Creatinine Ratio: 14 (ref 12–28)
BUN: 11 mg/dL (ref 8–27)
Bilirubin Total: <0.2 mg/dL (ref 0.0–1.2)
CALCIUM: 9.2 mg/dL (ref 8.7–10.3)
CO2: 24 mmol/L (ref 20–29)
CREATININE: 0.79 mg/dL (ref 0.57–1.00)
Chloride: 97 mmol/L (ref 96–106)
GFR calc Af Amer: 87 mL/min/{1.73_m2} (ref >59)
GFR, EST NON AFRICAN AMERICAN: 76 mL/min/{1.73_m2} (ref >59)
GLOBULIN, TOTAL: 2.3 g/dL (ref 1.5–4.5)
Glucose: 86 mg/dL (ref 65–99)
Potassium: 4.4 mmol/L (ref 3.5–5.2)
Sodium: 135 mmol/L (ref 134–144)
Total Protein: 6.5 g/dL (ref 6.0–8.5)

## 2017-12-14 LAB — SEDIMENTATION RATE: Sed Rate: 5 mm/hr (ref 0–40)

## 2017-12-14 LAB — C-REACTIVE PROTEIN: CRP: 5 mg/L (ref 0–10)

## 2017-12-16 NOTE — Progress Notes (Signed)
Notified pt by [employer]

## 2017-12-17 DIAGNOSIS — R0789 Other chest pain: Secondary | ICD-10-CM | POA: Diagnosis not present

## 2017-12-19 DIAGNOSIS — Z79899 Other long term (current) drug therapy: Secondary | ICD-10-CM | POA: Diagnosis not present

## 2017-12-20 DIAGNOSIS — H26492 Other secondary cataract, left eye: Secondary | ICD-10-CM | POA: Diagnosis not present

## 2017-12-20 DIAGNOSIS — Z9229 Personal history of other drug therapy: Secondary | ICD-10-CM | POA: Diagnosis not present

## 2017-12-31 DIAGNOSIS — J8410 Pulmonary fibrosis, unspecified: Secondary | ICD-10-CM | POA: Diagnosis not present

## 2017-12-31 DIAGNOSIS — M368 Systemic disorders of connective tissue in other diseases classified elsewhere: Secondary | ICD-10-CM | POA: Insufficient documentation

## 2017-12-31 DIAGNOSIS — M359 Systemic involvement of connective tissue, unspecified: Secondary | ICD-10-CM | POA: Diagnosis not present

## 2017-12-31 DIAGNOSIS — Z79899 Other long term (current) drug therapy: Secondary | ICD-10-CM | POA: Diagnosis not present

## 2017-12-31 DIAGNOSIS — J984 Other disorders of lung: Secondary | ICD-10-CM | POA: Diagnosis not present

## 2018-01-02 DIAGNOSIS — J8410 Pulmonary fibrosis, unspecified: Secondary | ICD-10-CM | POA: Diagnosis not present

## 2018-01-02 DIAGNOSIS — R55 Syncope and collapse: Secondary | ICD-10-CM | POA: Diagnosis not present

## 2018-01-02 DIAGNOSIS — I2510 Atherosclerotic heart disease of native coronary artery without angina pectoris: Secondary | ICD-10-CM | POA: Diagnosis not present

## 2018-01-02 DIAGNOSIS — R0602 Shortness of breath: Secondary | ICD-10-CM | POA: Diagnosis not present

## 2018-01-13 ENCOUNTER — Encounter: Payer: Self-pay | Admitting: Family Medicine

## 2018-01-13 ENCOUNTER — Ambulatory Visit (INDEPENDENT_AMBULATORY_CARE_PROVIDER_SITE_OTHER): Payer: Medicare Other | Admitting: Family Medicine

## 2018-01-13 ENCOUNTER — Ambulatory Visit: Payer: Medicare Other | Admitting: Unknown Physician Specialty

## 2018-01-13 VITALS — BP 103/65 | HR 70 | Temp 98.2°F | Wt 143.5 lb

## 2018-01-13 DIAGNOSIS — R748 Abnormal levels of other serum enzymes: Secondary | ICD-10-CM | POA: Diagnosis not present

## 2018-01-13 DIAGNOSIS — F419 Anxiety disorder, unspecified: Secondary | ICD-10-CM | POA: Diagnosis not present

## 2018-01-13 DIAGNOSIS — E782 Mixed hyperlipidemia: Secondary | ICD-10-CM

## 2018-01-13 DIAGNOSIS — F329 Major depressive disorder, single episode, unspecified: Secondary | ICD-10-CM

## 2018-01-13 DIAGNOSIS — F5101 Primary insomnia: Secondary | ICD-10-CM | POA: Diagnosis not present

## 2018-01-13 LAB — CBC WITH DIFFERENTIAL/PLATELET
HEMOGLOBIN: 12.1 g/dL (ref 11.1–15.9)
Hematocrit: 36.7 % (ref 34.0–46.6)
Lymphocytes Absolute: 1.2 10*3/uL (ref 0.7–3.1)
Lymphs: 15 %
MCH: 30.7 pg (ref 26.6–33.0)
MCHC: 33 g/dL (ref 31.5–35.7)
MCV: 93 fL (ref 79–97)
MID (Absolute): 1.1 10*3/uL (ref 0.1–1.6)
MID: 14 %
Neutrophils Absolute: 5.6 10*3/uL (ref 1.4–7.0)
Neutrophils: 71 %
Platelets: 242 10*3/uL (ref 150–450)
RBC: 3.94 x10E6/uL (ref 3.77–5.28)
RDW: 13.9 % (ref 12.3–15.4)
WBC: 7.9 10*3/uL (ref 3.4–10.8)

## 2018-01-13 MED ORDER — OMEPRAZOLE 20 MG PO CPDR: 20.0000 mg | DELAYED_RELEASE_CAPSULE | Freq: Every day | ORAL | 1 refills | Status: DC

## 2018-01-13 MED ORDER — SERTRALINE HCL 50 MG PO TABS: 50.0000 mg | ORAL_TABLET | Freq: Every day | ORAL | 1 refills | Status: DC

## 2018-01-13 NOTE — Progress Notes (Signed)
BP 103/65 (BP Location: Left Arm, Patient Position: Sitting, Cuff Size: Normal)   Pulse 70   Temp 98.2 F (36.8 C)   Wt 143 lb 8 oz (65.1 kg)   LMP  (LMP Unknown)   SpO2 100%   BMI 25.42 kg/m    Subjective:    Patient ID: Melanie Rollins, female    DOB: [birth year], 72 y.o.   MRN: [id number]  HPI: Melanie Rollins is a 71 y.o. female  Chief Complaint  Patient presents with  . Anxiety  . Depression  . Hyperlipidemia   ANXIETY/DEPRESSION Duration:controlled Anxious mood: no  Excessive worrying: no Irritability: no  Sweating: no Nausea: no Palpitations:no Hyperventilation: no Panic attacks: no Agoraphobia: no  Obscessions/compulsions: no Depressed mood: no Depression screen [facility] 2/9 01/13/2018 10/28/2017 10/10/2017 07/15/2017 07/06/2016  Decreased Interest 0 0 0 0 0  Down, Depressed, Hopeless 0 0 0 0 0  PHQ - 2 Score 0 0 0 0 0  Altered sleeping _0 Tired, decreased energy _1 Change in appetite 0 1 0 1 0  Feeling bad or failure about yourself  0 0 0 0 0  Trouble concentrating 0 1 0 1 0  Moving slowly or fidgety/restless 0 0 0 0 0  Suicidal thoughts 0 0 0 0 0  PHQ-9 Score _2 Difficult doing work/chores Not difficult at all Somewhat difficult - Very difficult -   GAD 7 : Generalized Anxiety Score 01/13/2018 10/28/2017 06/08/2015  Nervous, Anxious, on Edge 0 0 1  Control/stop worrying 0 0 1  Worry too much - different things 0 0 1  Trouble relaxing 0 0 0  Restless 0 0 0  Easily annoyed or irritable 0 0 0  Afraid - awful might happen 0 0 1  Total GAD 7 Score 0 0 4  Anxiety Difficulty Not difficult at all Not difficult at all -   Anhedonia: no Weight changes: no Insomnia: no   Hypersomnia: no Fatigue/loss of energy: no Feelings of worthlessness: no Feelings of guilt: no Impaired concentration/indecisiveness: no Suicidal ideations: no  Crying spells: no Recent Stressors/Life Changes: no   Relationship problems: no   Family stress: no    Financial stress: no    Job stress: no    Recent death/loss: no  INSOMNIA- taking about 1x a month as needed. Helps.  Duration: chronic Satisfied with sleep quality: yes Difficulty falling asleep: no Difficulty staying asleep: no Waking a few hours after sleep onset: no Early morning awakenings: no Daytime hypersomnolence: no Wakes feeling refreshed: no Good sleep hygiene: no Apnea: no Snoring: no Depressed/anxious mood: no Recent stress: no Restless legs/nocturnal leg cramps: no Chronic pain/arthritis: no History of sleep study: no Treatments attempted: melatonin, uinsom, benadryl and ambien   HYPERLIPIDEMIA Hyperlipidemia status: excellent compliance Satisfied with current treatment?  yes Side effects:  Not on anything Aspirin:  no The 10-year ASCVD risk score Mikey Bussing DC Jr., et al., 2013) is: 6.9%   Values used to calculate the score:     Age: 20 years     Sex: Female     Is Non-Hispanic African American: No     Diabetic: No     Tobacco smoker: No     Systolic Blood Pressure: 549 mmHg     Is BP treated: No     HDL Cholesterol: 70 mg/dL     Total Cholesterol: 210 mg/dL Chest pain:  no Coronary artery disease:  no  Relevant past medical, surgical, family and social history reviewed and updated as indicated. Interim medical history since our last visit reviewed. Allergies and medications reviewed and updated.  Review of Systems  Constitutional: Negative.   Respiratory: Negative.   Cardiovascular: Negative.   Musculoskeletal: Positive for neck pain and neck stiffness. Negative for arthralgias, back pain, gait problem, joint swelling and myalgias.  Skin: Negative.   Psychiatric/Behavioral: Negative.     Per HPI unless specifically indicated above     Objective:    BP 103/65 (BP Location: Left Arm, Patient Position: Sitting, Cuff Size: Normal)   Pulse 70   Temp 98.2 F (36.8 C)   Wt 143 lb 8 oz (65.1 kg)   LMP  (LMP Unknown)   SpO2 100%   BMI 25.42 kg/m     Wt Readings from Last 3 Encounters:  01/13/18 143 lb 8 oz (65.1 kg)  12/13/17 142 lb 12.8 oz (64.8 kg)  11/28/17 143 lb 4 oz (65 kg)    Physical Exam  Constitutional: She is oriented to person, place, and time. She appears well-developed and well-nourished. No distress.  HENT:  Head: Normocephalic and atraumatic.  Right Ear: Hearing normal.  Left Ear: Hearing normal.  Nose: Nose normal.  Eyes: Conjunctivae and lids are normal. Right eye exhibits no discharge. Left eye exhibits no discharge. No scleral icterus.  Cardiovascular: Normal rate, regular rhythm, normal heart sounds and intact distal pulses. Exam reveals no gallop and no friction rub.  No murmur heard. Pulmonary/Chest: Effort normal and breath sounds normal. No stridor. No respiratory distress. She has no wheezes. She has no rales. She exhibits no tenderness.  Musculoskeletal: Normal range of motion.  Neurological: She is alert and oriented to person, place, and time.  Skin: Skin is warm, dry and intact. Capillary refill takes less than 2 seconds. No rash noted. She is not diaphoretic. No erythema. No pallor.  Psychiatric: She has a normal mood and affect. Her speech is normal and behavior is normal. Judgment and thought content normal. Cognition and memory are normal.  Nursing note and vitals reviewed.   Results for orders placed or performed in visit on 12/13/17  Sed Rate (ESR)  Result Value Ref Range   Sed Rate 5 0 - 40 mm/hr  C-reactive protein  Result Value Ref Range   CRP 5 0 - 10 mg/L  Comprehensive metabolic panel  Result Value Ref Range   Glucose 86 65 - 99 mg/dL   BUN 11 8 - 27 mg/dL   Creatinine, Ser 0.79 0.57 - 1.00 mg/dL   GFR calc non Af Amer 76 >59 mL/min/1.73   GFR calc Af Amer 87 >59 mL/min/1.73   BUN/Creatinine Ratio 14 12 - 28   Sodium 135 134 - 144 mmol/L   Potassium 4.4 3.5 - 5.2 mmol/L   Chloride 97 96 - 106 mmol/L   CO2 24 20 - 29 mmol/L   Calcium 9.2 8.7 - 10.3 mg/dL   Total Protein 6.5  6.0 - 8.5 g/dL   Albumin 4.2 3.5 - 4.8 g/dL   Globulin, Total 2.3 1.5 - 4.5 g/dL   Albumin/Globulin Ratio 1.8 1.2 - 2.2   Bilirubin Total <0.2 0.0 - 1.2 mg/dL   Alkaline Phosphatase 57 39 - 117 IU/L   AST 28 0 - 40 IU/L   ALT 17 0 - 32 IU/L      Assessment & Plan:   Problem List Items Addressed This Visit      Other  Anxiety and depression - Primary    Under good control on her sertraline. Refills given today. Call with any concerns. Continue to monitor.       Relevant Medications   sertraline (ZOLOFT) 50 MG tablet   Other Relevant Orders   CBC With Differential/Platelet   TSH   Hyperlipidemia    Rechecking levels today. Await results. Call with any concerns.       Relevant Orders   Comprehensive metabolic panel   Lipid Panel w/o Chol/HDL Ratio   Insomnia    Taking ambien about 1x a month or less. Does not need refill today. Call when she needs it. Call with any concerns.       Relevant Orders   Comprehensive metabolic panel   CBC With Differential/Platelet   Elevated liver enzymes    Rechecking levels today. Await results. Call with any concerns.       Relevant Orders   Comprehensive metabolic panel       Follow up plan: Return in about 6 months (around 07/16/2018) for Physical and Wellness.

## 2018-01-13 NOTE — Assessment & Plan Note (Signed)
Under good control on her sertraline. Refills given today. Call with any concerns. Continue to monitor.

## 2018-01-13 NOTE — Assessment & Plan Note (Signed)
Rechecking levels today. Await results. Call with any concerns.

## 2018-01-13 NOTE — Assessment & Plan Note (Signed)
Taking ambien about 1x a month or less. Does not need refill today. Call when she needs it. Call with any concerns.

## 2018-01-13 NOTE — Assessment & Plan Note (Signed)
Rechecking levels today. Await results. Call with any concerns.  

## 2018-01-14 LAB — LIPID PANEL W/O CHOL/HDL RATIO
CHOLESTEROL TOTAL: 198 mg/dL (ref 100–199)
HDL: 71 mg/dL (ref >39)
LDL CALC: 95 mg/dL (ref 0–99)
TRIGLYCERIDES: 158 mg/dL — AB (ref 0–149)
VLDL Cholesterol Cal: 32 mg/dL (ref 5–40)

## 2018-01-14 LAB — COMPREHENSIVE METABOLIC PANEL
ALK PHOS: 52 IU/L (ref 39–117)
ALT: 18 IU/L (ref 0–32)
AST: 26 IU/L (ref 0–40)
Albumin/Globulin Ratio: 1.7 (ref 1.2–2.2)
Albumin: 3.9 g/dL (ref 3.5–4.8)
BUN/Creatinine Ratio: 10 — ABNORMAL LOW (ref 12–28)
BUN: 9 mg/dL (ref 8–27)
Bilirubin Total: <0.2 mg/dL (ref 0.0–1.2)
CALCIUM: 9.5 mg/dL (ref 8.7–10.3)
CO2: 24 mmol/L (ref 20–29)
CREATININE: 0.88 mg/dL (ref 0.57–1.00)
Chloride: 101 mmol/L (ref 96–106)
GFR calc Af Amer: 76 mL/min/{1.73_m2} (ref >59)
GFR, EST NON AFRICAN AMERICAN: 66 mL/min/{1.73_m2} (ref >59)
GLUCOSE: 69 mg/dL (ref 65–99)
Globulin, Total: 2.3 g/dL (ref 1.5–4.5)
Potassium: 4.2 mmol/L (ref 3.5–5.2)
SODIUM: 140 mmol/L (ref 134–144)
Total Protein: 6.2 g/dL (ref 6.0–8.5)

## 2018-01-14 LAB — SPECIMEN STATUS REPORT

## 2018-01-14 LAB — TSH: TSH: 1.75 u[IU]/mL (ref 0.450–4.500)

## 2018-01-22 DIAGNOSIS — M792 Neuralgia and neuritis, unspecified: Secondary | ICD-10-CM | POA: Diagnosis not present

## 2018-01-22 DIAGNOSIS — J849 Interstitial pulmonary disease, unspecified: Secondary | ICD-10-CM | POA: Diagnosis not present

## 2018-01-22 DIAGNOSIS — M359 Systemic involvement of connective tissue, unspecified: Secondary | ICD-10-CM | POA: Diagnosis not present

## 2018-01-22 DIAGNOSIS — H00011 Hordeolum externum right upper eyelid: Secondary | ICD-10-CM | POA: Diagnosis not present

## 2018-01-22 DIAGNOSIS — M539 Dorsopathy, unspecified: Secondary | ICD-10-CM | POA: Diagnosis not present

## 2018-01-22 DIAGNOSIS — I7300 Raynaud's syndrome without gangrene: Secondary | ICD-10-CM | POA: Diagnosis not present

## 2018-02-10 DIAGNOSIS — H2513 Age-related nuclear cataract, bilateral: Secondary | ICD-10-CM | POA: Diagnosis not present

## 2018-02-18 ENCOUNTER — Other Ambulatory Visit: Payer: Self-pay | Admitting: Family Medicine

## 2018-02-18 ENCOUNTER — Encounter: Payer: Self-pay | Admitting: Family Medicine

## 2018-02-18 DIAGNOSIS — M542 Cervicalgia: Secondary | ICD-10-CM

## 2018-02-19 ENCOUNTER — Encounter: Payer: Self-pay | Admitting: Family Medicine

## 2018-02-21 DIAGNOSIS — Z9229 Personal history of other drug therapy: Secondary | ICD-10-CM | POA: Diagnosis not present

## 2018-02-21 DIAGNOSIS — H00021 Hordeolum internum right upper eyelid: Secondary | ICD-10-CM | POA: Diagnosis not present

## 2018-02-24 DIAGNOSIS — M542 Cervicalgia: Secondary | ICD-10-CM | POA: Diagnosis not present

## 2018-02-26 DIAGNOSIS — Z23 Encounter for immunization: Secondary | ICD-10-CM | POA: Diagnosis not present

## 2018-02-26 DIAGNOSIS — M542 Cervicalgia: Secondary | ICD-10-CM | POA: Diagnosis not present

## 2018-03-03 DIAGNOSIS — M542 Cervicalgia: Secondary | ICD-10-CM | POA: Diagnosis not present

## 2018-03-05 DIAGNOSIS — M542 Cervicalgia: Secondary | ICD-10-CM | POA: Diagnosis not present

## 2018-03-07 DIAGNOSIS — H04123 Dry eye syndrome of bilateral lacrimal glands: Secondary | ICD-10-CM | POA: Diagnosis not present

## 2018-03-07 DIAGNOSIS — H0011 Chalazion right upper eyelid: Secondary | ICD-10-CM | POA: Diagnosis not present

## 2018-03-09 ENCOUNTER — Encounter: Payer: Self-pay | Admitting: Emergency Medicine

## 2018-03-09 ENCOUNTER — Emergency Department: Payer: Medicare Other

## 2018-03-09 ENCOUNTER — Other Ambulatory Visit: Payer: Self-pay

## 2018-03-09 ENCOUNTER — Inpatient Hospital Stay
Admission: EM | Admit: 2018-03-09 | Discharge: 2018-03-14 | DRG: 481 | Disposition: A | Discharge status: Skilled Nursing Facility | Payer: Medicare Other | Attending: Internal Medicine | Admitting: Internal Medicine

## 2018-03-09 DIAGNOSIS — E785 Hyperlipidemia, unspecified: Secondary | ICD-10-CM | POA: Diagnosis present

## 2018-03-09 DIAGNOSIS — D62 Acute posthemorrhagic anemia: Secondary | ICD-10-CM | POA: Diagnosis not present

## 2018-03-09 DIAGNOSIS — S72352A Displaced comminuted fracture of shaft of left femur, initial encounter for closed fracture: Secondary | ICD-10-CM | POA: Diagnosis present

## 2018-03-09 DIAGNOSIS — S72492A Other fracture of lower end of left femur, initial encounter for closed fracture: Secondary | ICD-10-CM | POA: Diagnosis not present

## 2018-03-09 DIAGNOSIS — Z9981 Dependence on supplemental oxygen: Secondary | ICD-10-CM

## 2018-03-09 DIAGNOSIS — Z419 Encounter for procedure for purposes other than remedying health state, unspecified: Secondary | ICD-10-CM

## 2018-03-09 DIAGNOSIS — F32A Depression, unspecified: Secondary | ICD-10-CM | POA: Diagnosis present

## 2018-03-09 DIAGNOSIS — Z79899 Other long term (current) drug therapy: Secondary | ICD-10-CM

## 2018-03-09 DIAGNOSIS — S72092A Other fracture of head and neck of left femur, initial encounter for closed fracture: Secondary | ICD-10-CM

## 2018-03-09 DIAGNOSIS — Z7983 Long term (current) use of bisphosphonates: Secondary | ICD-10-CM | POA: Diagnosis not present

## 2018-03-09 DIAGNOSIS — S7222XA Displaced subtrochanteric fracture of left femur, initial encounter for closed fracture: Secondary | ICD-10-CM | POA: Diagnosis not present

## 2018-03-09 DIAGNOSIS — Z8249 Family history of ischemic heart disease and other diseases of the circulatory system: Secondary | ICD-10-CM

## 2018-03-09 DIAGNOSIS — S72002A Fracture of unspecified part of neck of left femur, initial encounter for closed fracture: Secondary | ICD-10-CM | POA: Diagnosis present

## 2018-03-09 DIAGNOSIS — F419 Anxiety disorder, unspecified: Secondary | ICD-10-CM | POA: Diagnosis present

## 2018-03-09 DIAGNOSIS — W06XXXA Fall from bed, initial encounter: Secondary | ICD-10-CM | POA: Diagnosis present

## 2018-03-09 DIAGNOSIS — M069 Rheumatoid arthritis, unspecified: Secondary | ICD-10-CM | POA: Diagnosis present

## 2018-03-09 DIAGNOSIS — F329 Major depressive disorder, single episode, unspecified: Secondary | ICD-10-CM | POA: Diagnosis present

## 2018-03-09 DIAGNOSIS — Y92013 Bedroom of single-family (private) house as the place of occurrence of the external cause: Secondary | ICD-10-CM

## 2018-03-09 DIAGNOSIS — J849 Interstitial pulmonary disease, unspecified: Secondary | ICD-10-CM | POA: Diagnosis not present

## 2018-03-09 DIAGNOSIS — J8410 Pulmonary fibrosis, unspecified: Secondary | ICD-10-CM | POA: Diagnosis present

## 2018-03-09 DIAGNOSIS — I9589 Other hypotension: Secondary | ICD-10-CM | POA: Diagnosis not present

## 2018-03-09 DIAGNOSIS — M25552 Pain in left hip: Secondary | ICD-10-CM | POA: Diagnosis not present

## 2018-03-09 LAB — CBC WITH DIFFERENTIAL/PLATELET
ABS IMMATURE GRANULOCYTES: 0.06 10*3/uL (ref 0.00–0.07)
BASOS PCT: 0 %
Basophils Absolute: 0 10*3/uL (ref 0.0–0.1)
EOS ABS: 0.1 10*3/uL (ref 0.0–0.5)
Eosinophils Relative: 1 %
HEMATOCRIT: 33.4 % — AB (ref 36.0–46.0)
Hemoglobin: 10.6 g/dL — ABNORMAL LOW (ref 12.0–15.0)
Immature Granulocytes: 1 %
LYMPHS ABS: 1.5 10*3/uL (ref 0.7–4.0)
Lymphocytes Relative: 20 %
MCH: 29.3 pg (ref 26.0–34.0)
MCHC: 31.7 g/dL (ref 30.0–36.0)
MCV: 92.3 fL (ref 80.0–100.0)
MONO ABS: 0.7 10*3/uL (ref 0.1–1.0)
MONOS PCT: 9 %
Neutro Abs: 5.3 10*3/uL (ref 1.7–7.7)
Neutrophils Relative %: 69 %
PLATELETS: 235 10*3/uL (ref 150–400)
RBC: 3.62 MIL/uL — ABNORMAL LOW (ref 3.87–5.11)
RDW: 12.7 % (ref 11.5–15.5)
WBC: 7.7 10*3/uL (ref 4.0–10.5)
nRBC: 0 % (ref 0.0–0.2)

## 2018-03-09 LAB — BASIC METABOLIC PANEL
Anion gap: 8 (ref 5–15)
BUN: 9 mg/dL (ref 8–23)
CHLORIDE: 102 mmol/L (ref 98–111)
CO2: 24 mmol/L (ref 22–32)
Calcium: 8.2 mg/dL — ABNORMAL LOW (ref 8.9–10.3)
Creatinine, Ser: 0.68 mg/dL (ref 0.44–1.00)
GFR calc Af Amer: >60 mL/min (ref >60)
GLUCOSE: 114 mg/dL — AB (ref 70–99)
POTASSIUM: 3.9 mmol/L (ref 3.5–5.1)
SODIUM: 134 mmol/L — AB (ref 135–145)

## 2018-03-09 MED ORDER — MORPHINE SULFATE (PF) 2 MG/ML IV SOLN
INTRAVENOUS | Status: AC
  Administered 2018-03-10: 2 mg via INTRAVENOUS
  Filled 2018-03-09: qty 1

## 2018-03-09 MED ORDER — FENTANYL CITRATE (PF) 100 MCG/2ML IJ SOLN
50.0000 ug | INTRAMUSCULAR | Status: DC | PRN
  Administered 2018-03-09: 50 ug via INTRAVENOUS
  Filled 2018-03-09: qty 2

## 2018-03-09 NOTE — ED Provider Notes (Signed)
[facility] Emergency Department Provider Note    None    (approximate)  I have reviewed the triage vital signs and the nursing notes.   HISTORY  Chief Complaint Hip Pain and Fall   HPI Melanie Rollins is a 72 y.o. female with below list of chronic medical conditions presents to the emergency department with history of accidental fall while getting in bed tonight.  Patient states that she has a step stool that she uses to enter her bed and accidentally fell from that stepstool while entering the bed.  Patient admits to 8 out of 10 left hip pain at this time.  Patient was given 50 mcg of fentanyl by EMS with improvement of pain from 10 out of 10 to 8 out of 10.  Patient denies any head injury no loss of consciousness.   Past Medical History:  Diagnosis Date  . Arthritis    RA  . Hyperlipidemia   . Pulmonary fibrosis ([place])   . Reynolds syndrome [facility])     Patient Active Problem List   Diagnosis Date Noted  . Closed left hip fracture ([place]) 03/10/2018  . Facial pain 12/13/2017  . Elevated liver enzymes 12/13/2017  . 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 Near syncope 10/10/2017  . Cervical pain (neck) 06/17/2017  . Thoracic spine pain 06/17/2017  . Advanced care planning/counseling discussion 07/06/2016  . Osteopenia 07/06/2016  . Nephrolithiasis 03/14/2015  . 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 Pulmonary fibrosis ([place]) 03/14/2015  . Anxiety and depression 03/14/2015  . Hyperlipidemia 03/14/2015  . Insomnia 03/14/2015  . Osteoarthritis 03/14/2015  . Atrophic vaginitis 03/14/2015    Past Surgical History:  Procedure Laterality Date  . kidney stones    . LUNG BIOPSY      Prior to Admission medications   Medication Sig Start Date End Date Taking? Authorizing Provider  Acetylcysteine (NAC) 600 MG CAPS Take 600 mg by mouth 2 (two) times daily.   Yes [provider]  alendronate (FOSAMAX) 70 MG tablet Take 70 mg by mouth once  a week. Take with a full glass of water on an empty stomach.   Yes [provider]  Biotin 10000 MCG TBDP Take 10,000 mcg by mouth daily.   Yes [provider]  Calcium Citrate-Vitamin D (CALCIUM + D PO) Take 600 mg by mouth daily.   Yes [provider]  Cholecalciferol (VITAMIN D) 2000 units CAPS Take 2,000 Units by mouth daily.   Yes [provider]  CINNAMON PO Take 1,000 mg by mouth daily.   Yes [provider]  cycloSPORINE (RESTASIS) 0.05 % ophthalmic emulsion Place 1 drop into both eyes 2 (two) times daily.   Yes [provider]  hydroxychloroquine (PLAQUENIL) 200 MG tablet 400 mg.  05/28/16  Yes [provider]  L-Lysine 500 MG TABS Take 500 mg by mouth daily.   Yes [provider]  Magnesium 250 MG TABS Take 250 mg by mouth daily.    Yes [provider]  Multiple Vitamin (MULTIVITAMIN) tablet Take 1 tablet by mouth daily.   Yes [provider]  mycophenolate (CELLCEPT) 500 MG tablet Take 2 tablets by mouth 2 (two) times daily.   Yes [provider]  Omega-3 Fatty Acids (FISH OIL) 1000 MG CAPS Take 1,000 mg by mouth daily.   Yes [provider]  omeprazole (PRILOSEC) 20 MG capsule Take 1 capsule (20 mg total) by mouth daily. 01/13/18  Yes Johnson, [place], DO  predniSONE (DELTASONE) 10 MG tablet Take 10 mg by mouth daily.   Yes [provider]  sertraline (ZOLOFT) 50 MG tablet Take 1 tablet (50 mg total) by mouth daily. 01/13/18  Yes Johnson, Megan P, DO  zolpidem (AMBIEN) 10 MG tablet Take 0.5 tablets (5 mg total) by mouth at bedtime as needed for sleep. 10/25/17  Yes Kathrine Haddock, NP    Allergies Imuran [azathioprine]  Family History  Problem Relation Age of Onset  . Hypertension Mother   . Heart disease Mother        pacemaker and CHF  . Glaucoma Mother   . Hypertension Brother   . Arthritis Sister   . Heart disease Maternal Grandmother   . Heart disease Maternal  Grandfather   . Hypertension Paternal Grandmother   . Stroke Paternal Grandmother   . Cancer Paternal Grandfather     Social History Social History   Tobacco Use  . Smoking status: Never Smoker  . Smokeless tobacco: Never Used  Substance Use Topics  . Alcohol use: Yes    Alcohol/week: 0.0 standard drinks    Comment: pt states she drinks wine on occasion  . Drug use: No    Review of Systems Constitutional: No fever/chills Eyes: No visual changes. ENT: No sore throat. Cardiovascular: Denies chest pain. Respiratory: Denies shortness of breath. Gastrointestinal: No abdominal pain.  No nausea, no vomiting.  No diarrhea.  No constipation. Genitourinary: Negative for dysuria. Musculoskeletal: Positive for left hip pain Integumentary: Negative for rash. Neurological: Negative for headaches, focal weakness or numbness.   ____________________________________________   PHYSICAL EXAM:  VITAL SIGNS: ED Triage Vitals  Enc Vitals Group     BP --      Pulse Rate 03/09/18 2255 66     Resp 03/09/18 2255 18     Temp 03/09/18 2255 (!) 97.5 F (36.4 C)     Temp Source 03/09/18 2255 Oral     SpO2 03/09/18 2255 98 %     Weight 03/09/18 2256 63.5 kg (140 lb)     Height 03/09/18 2256 1.613 m (5' 3.5")     Head Circumference --      Peak Flow --      Pain Score 03/09/18 2255 8     Pain Loc --      Pain Edu? --      Excl. in [place]? --     Constitutional: Alert and oriented.  Apparent discomfort  eyes: Conjunctivae are normal. PERRL. EOMI. Head: Atraumatic. Mouth/Throat: Mucous membranes are moist. Oropharynx non-erythematous. Neck: No stridor.  No meningeal signs.  No cervical spine tenderness to palpation. Cardiovascular: Normal rate, regular rhythm. Good peripheral circulation. Grossly normal heart sounds. Respiratory: Normal respiratory effort.  No retractions. Lungs CTAB. Gastrointestinal: Soft and nontender. No distention.  Musculoskeletal: Left leg shortening, external  rotation.  Left hip discomfort with mild palpation.   Neurologic:  Normal speech and language. No gross focal neurologic deficits are appreciated.  Skin:  Skin is warm, dry and intact. No rash noted. Psychiatric: Mood and affect are normal. Speech and behavior are normal.  ____________________________________________   LABS (all labs ordered are listed, but only abnormal results are displayed)  Labs Reviewed  BASIC METABOLIC PANEL - Abnormal; Notable for the following components:      Result Value   Sodium 134 (*)    Glucose, Bld 114 (*)    Calcium 8.2 (*)    All other components within normal limits  CBC WITH DIFFERENTIAL/PLATELET - Abnormal; Notable  for the following components:   RBC 3.62 (*)    Hemoglobin 10.6 (*)    HCT 33.4 (*)    All other components within normal limits  PROTIME-INR  TYPE AND SCREEN   ____________________________________________  EKG  ED ECG REPORT I, Victor N Brynnleigh Mcelwee, the attending physician, personally viewed and interpreted this ECG.   Date: 03/10/2018  EKG Time: 11:02 PM  Rate: 67  Rhythm: Normal sinus rhythm  Axis: Normal  Intervals: Normal  ST&T Change: None  ____________________________________________  RADIOLOGY I, Durant N Channin Agustin, personally viewed and evaluated these images (plain radiographs) as part of my medical decision making, as well as reviewing the written report by the radiologist.  ED MD interpretation: Comminuted displaced left proximal femur  Official radiology report(s): Dg Chest 1 View  Result Date: 03/09/2018 CLINICAL DATA:  Left hip fracture.  History of pulmonary fibrosis. EXAM: CHEST  1 VIEW COMPARISON:  Chest CT 05/08/2015. FINDINGS: Peripheral reticulation, left greater than right with slight basilar predominant gradient, similar to prior CT. Chronic blunting of left costophrenic angle with surgical sutures. No superimposed airspace disease. Unchanged heart size and mediastinal contours, borderline  cardiomegaly. No pneumothorax. No visualized rib fractures. Scoliotic curvature of the spine. IMPRESSION: Interstitial lung disease, grossly unchanged from 2016 CT allowing for differences in modality. No acute superimposed abnormality. Electronically Signed   By: Keith Rake M.D.   On: 03/09/2018 23:51   Dg Hip Unilat With Pelvis 2-3 Views Left  Result Date: 03/09/2018 CLINICAL DATA:  Left hip pain after fall out of bed. EXAM: DG HIP (WITH OR WITHOUT PELVIS) 2-3V LEFT COMPARISON:  None. FINDINGS: Comminuted displaced left proximal femur fracture. Fracture involves the proximal shaft, subtrochanteric and lesser trochanteric regions. There is osseous distraction of 4 cm about the femoral shaft component. Displacement of 7 mm about the lesser trochanteric component. Apex lateral angulation. Femoral head remains seated. Pubic rami are intact. Pubic symphysis and sacroiliac joints are congruent. IMPRESSION: Comminuted displaced left proximal femur fracture involving the proximal femoral shaft, subtrochanteric and lesser trochanteric portions. Significant displacement with mild apex lateral angulation. Electronically Signed   By: Keith Rake M.D.   On: 03/09/2018 23:47      Procedures   ____________________________________________   INITIAL IMPRESSION / ASSESSMENT AND PLAN / ED COURSE  As part of my medical decision making, I reviewed the following data within the electronic MEDICAL RECORD NUMBER   72 year old female presented with above-stated history and physical exam secondary to left hip pain following fall.  Concern for possible hip fracture such x-ray was performed which revealed comminuted displaced proximal left femur fracture.  Patient discussed with Dr. Harlow Mares orthopedic surgeon on-call as well as Dr. Jannifer Franklin hospitalist for admission.  Patient given 50 mcg of fentanyl however pain recurred and as such patient was given IV morphine and will get be given IV morphine as  needed ____________________________________________  FINAL CLINICAL IMPRESSION(S) / ED DIAGNOSES  Final diagnoses:  Closed comminuted fracture of left hip, initial encounter [facility])     MEDICATIONS GIVEN DURING THIS VISIT:  Medications  fentaNYL (SUBLIMAZE) injection 50 mcg (50 mcg Intravenous Given 03/09/18 2313)  morphine 2 MG/ML injection 2 mg (2 mg Intravenous Given 03/10/18 0008)  ondansetron (ZOFRAN) injection 4 mg (4 mg Intravenous Given 03/10/18 0008)     ED Discharge Orders    None       Note:  This document was prepared using Dragon voice recognition software and may include unintentional dictation errors.    Marjean Donna  N, MD 03/10/18 8101

## 2018-03-09 NOTE — ED Triage Notes (Signed)
EMS pt to rm 24 from home with report of fall off bed landing on left side. Pop to left hip region.

## 2018-03-09 NOTE — ED Notes (Signed)
Pt returned from X-ray.  

## 2018-03-09 NOTE — ED Notes (Signed)
Pt to xray.

## 2018-03-10 ENCOUNTER — Inpatient Hospital Stay: Payer: Medicare Other

## 2018-03-10 ENCOUNTER — Inpatient Hospital Stay: Payer: Medicare Other | Admitting: Anesthesiology

## 2018-03-10 ENCOUNTER — Encounter
Admission: EM | Disposition: A | Discharge status: Skilled Nursing Facility | Payer: Self-pay | Source: Home / Self Care | Attending: Internal Medicine

## 2018-03-10 DIAGNOSIS — M810 Age-related osteoporosis without current pathological fracture: Secondary | ICD-10-CM | POA: Diagnosis not present

## 2018-03-10 DIAGNOSIS — R531 Weakness: Secondary | ICD-10-CM | POA: Diagnosis not present

## 2018-03-10 DIAGNOSIS — M368 Systemic disorders of connective tissue in other diseases classified elsewhere: Secondary | ICD-10-CM | POA: Diagnosis not present

## 2018-03-10 DIAGNOSIS — M6281 Muscle weakness (generalized): Secondary | ICD-10-CM | POA: Diagnosis not present

## 2018-03-10 DIAGNOSIS — J9611 Chronic respiratory failure with hypoxia: Secondary | ICD-10-CM | POA: Diagnosis not present

## 2018-03-10 DIAGNOSIS — Y92013 Bedroom of single-family (private) house as the place of occurrence of the external cause: Secondary | ICD-10-CM | POA: Diagnosis not present

## 2018-03-10 DIAGNOSIS — I480 Paroxysmal atrial fibrillation: Secondary | ICD-10-CM | POA: Diagnosis not present

## 2018-03-10 DIAGNOSIS — M5030 Other cervical disc degeneration, unspecified cervical region: Secondary | ICD-10-CM | POA: Diagnosis not present

## 2018-03-10 DIAGNOSIS — M1990 Unspecified osteoarthritis, unspecified site: Secondary | ICD-10-CM | POA: Diagnosis not present

## 2018-03-10 DIAGNOSIS — J8410 Pulmonary fibrosis, unspecified: Secondary | ICD-10-CM | POA: Diagnosis present

## 2018-03-10 DIAGNOSIS — I2510 Atherosclerotic heart disease of native coronary artery without angina pectoris: Secondary | ICD-10-CM | POA: Diagnosis not present

## 2018-03-10 DIAGNOSIS — S72002A Fracture of unspecified part of neck of left femur, initial encounter for closed fracture: Secondary | ICD-10-CM

## 2018-03-10 DIAGNOSIS — E785 Hyperlipidemia, unspecified: Secondary | ICD-10-CM | POA: Diagnosis not present

## 2018-03-10 DIAGNOSIS — Z7983 Long term (current) use of bisphosphonates: Secondary | ICD-10-CM | POA: Diagnosis not present

## 2018-03-10 DIAGNOSIS — D62 Acute posthemorrhagic anemia: Secondary | ICD-10-CM | POA: Diagnosis not present

## 2018-03-10 DIAGNOSIS — Z7401 Bed confinement status: Secondary | ICD-10-CM | POA: Diagnosis not present

## 2018-03-10 DIAGNOSIS — S72492A Other fracture of lower end of left femur, initial encounter for closed fracture: Secondary | ICD-10-CM | POA: Diagnosis not present

## 2018-03-10 DIAGNOSIS — I959 Hypotension, unspecified: Secondary | ICD-10-CM | POA: Diagnosis not present

## 2018-03-10 DIAGNOSIS — S7222XA Displaced subtrochanteric fracture of left femur, initial encounter for closed fracture: Secondary | ICD-10-CM | POA: Diagnosis present

## 2018-03-10 DIAGNOSIS — R2689 Other abnormalities of gait and mobility: Secondary | ICD-10-CM | POA: Diagnosis not present

## 2018-03-10 DIAGNOSIS — I9589 Other hypotension: Secondary | ICD-10-CM | POA: Diagnosis not present

## 2018-03-10 DIAGNOSIS — Z9981 Dependence on supplemental oxygen: Secondary | ICD-10-CM | POA: Diagnosis not present

## 2018-03-10 DIAGNOSIS — D649 Anemia, unspecified: Secondary | ICD-10-CM | POA: Diagnosis not present

## 2018-03-10 DIAGNOSIS — M4317 Spondylolisthesis, lumbosacral region: Secondary | ICD-10-CM | POA: Diagnosis not present

## 2018-03-10 DIAGNOSIS — I7300 Raynaud's syndrome without gangrene: Secondary | ICD-10-CM | POA: Diagnosis not present

## 2018-03-10 DIAGNOSIS — Z79899 Other long term (current) drug therapy: Secondary | ICD-10-CM | POA: Diagnosis not present

## 2018-03-10 DIAGNOSIS — F329 Major depressive disorder, single episode, unspecified: Secondary | ICD-10-CM | POA: Diagnosis present

## 2018-03-10 DIAGNOSIS — F418 Other specified anxiety disorders: Secondary | ICD-10-CM | POA: Diagnosis not present

## 2018-03-10 DIAGNOSIS — S72002D Fracture of unspecified part of neck of left femur, subsequent encounter for closed fracture with routine healing: Secondary | ICD-10-CM | POA: Diagnosis not present

## 2018-03-10 DIAGNOSIS — Z9181 History of falling: Secondary | ICD-10-CM | POA: Diagnosis not present

## 2018-03-10 DIAGNOSIS — S72142A Displaced intertrochanteric fracture of left femur, initial encounter for closed fracture: Secondary | ICD-10-CM | POA: Diagnosis not present

## 2018-03-10 DIAGNOSIS — Z8249 Family history of ischemic heart disease and other diseases of the circulatory system: Secondary | ICD-10-CM | POA: Diagnosis not present

## 2018-03-10 DIAGNOSIS — W06XXXA Fall from bed, initial encounter: Secondary | ICD-10-CM | POA: Diagnosis present

## 2018-03-10 DIAGNOSIS — S72352A Displaced comminuted fracture of shaft of left femur, initial encounter for closed fracture: Secondary | ICD-10-CM | POA: Diagnosis present

## 2018-03-10 DIAGNOSIS — M069 Rheumatoid arthritis, unspecified: Secondary | ICD-10-CM | POA: Diagnosis present

## 2018-03-10 DIAGNOSIS — M5136 Other intervertebral disc degeneration, lumbar region: Secondary | ICD-10-CM | POA: Diagnosis not present

## 2018-03-10 DIAGNOSIS — F419 Anxiety disorder, unspecified: Secondary | ICD-10-CM | POA: Diagnosis present

## 2018-03-10 DIAGNOSIS — F5105 Insomnia due to other mental disorder: Secondary | ICD-10-CM | POA: Diagnosis not present

## 2018-03-10 HISTORY — PX: INTRAMEDULLARY (IM) NAIL INTERTROCHANTERIC: SHX5875

## 2018-03-10 HISTORY — DX: Fracture of unspecified part of neck of left femur, initial encounter for closed fracture: S72.002A

## 2018-03-10 LAB — BASIC METABOLIC PANEL
ANION GAP: 7 (ref 5–15)
BUN: 9 mg/dL (ref 8–23)
CHLORIDE: 101 mmol/L (ref 98–111)
CO2: 26 mmol/L (ref 22–32)
Calcium: 8 mg/dL — ABNORMAL LOW (ref 8.9–10.3)
Creatinine, Ser: 0.61 mg/dL (ref 0.44–1.00)
GFR calc Af Amer: >60 mL/min (ref >60)
GFR calc non Af Amer: >60 mL/min (ref >60)
GLUCOSE: 111 mg/dL — AB (ref 70–99)
Potassium: 3 mmol/L — ABNORMAL LOW (ref 3.5–5.1)
Sodium: 134 mmol/L — ABNORMAL LOW (ref 135–145)

## 2018-03-10 LAB — CBC
HEMATOCRIT: 31.8 % — AB (ref 36.0–46.0)
Hemoglobin: 10.3 g/dL — ABNORMAL LOW (ref 12.0–15.0)
MCH: 29.7 pg (ref 26.0–34.0)
MCHC: 32.4 g/dL (ref 30.0–36.0)
MCV: 91.6 fL (ref 80.0–100.0)
PLATELETS: 234 10*3/uL (ref 150–400)
RBC: 3.47 MIL/uL — AB (ref 3.87–5.11)
RDW: 12.7 % (ref 11.5–15.5)
WBC: 15.2 10*3/uL — ABNORMAL HIGH (ref 4.0–10.5)
nRBC: 0 % (ref 0.0–0.2)

## 2018-03-10 LAB — PROTIME-INR
INR: 0.96
Prothrombin Time: 12.7 seconds (ref 11.4–15.2)

## 2018-03-10 LAB — MRSA PCR SCREENING: MRSA by PCR: NEGATIVE

## 2018-03-10 SURGERY — FIXATION, FRACTURE, INTERTROCHANTERIC, WITH INTRAMEDULLARY ROD
Anesthesia: Spinal | Site: Hip | Laterality: Left

## 2018-03-10 MED ORDER — ONDANSETRON HCL 4 MG PO TABS: 4.0000 mg | ORAL_TABLET | Freq: Four times a day (QID) | ORAL | Status: DC | PRN

## 2018-03-10 MED ORDER — CEFAZOLIN SODIUM-DEXTROSE 2-4 GM/100ML-% IV SOLN
2.0000 g | INTRAVENOUS | Status: AC
  Administered 2018-03-10: 2 g via INTRAVENOUS
  Filled 2018-03-10: qty 100

## 2018-03-10 MED ORDER — BUPIVACAINE HCL (PF) 0.5 % IJ SOLN
INTRAMUSCULAR | Status: DC | PRN
  Administered 2018-03-10: 3 mL

## 2018-03-10 MED ORDER — PANTOPRAZOLE SODIUM 40 MG PO TBEC
40.0000 mg | DELAYED_RELEASE_TABLET | Freq: Every day | ORAL | Status: DC
  Administered 2018-03-10 – 2018-03-14 (×5): 40 mg via ORAL
  Filled 2018-03-10 (×5): qty 1

## 2018-03-10 MED ORDER — SERTRALINE HCL 50 MG PO TABS
50.0000 mg | ORAL_TABLET | Freq: Every day | ORAL | Status: DC
  Administered 2018-03-11 – 2018-03-14 (×4): 50 mg via ORAL
  Filled 2018-03-10 (×4): qty 1

## 2018-03-10 MED ORDER — DOCUSATE SODIUM 100 MG PO CAPS
100.0000 mg | ORAL_CAPSULE | Freq: Two times a day (BID) | ORAL | Status: DC
  Administered 2018-03-10 – 2018-03-14 (×8): 100 mg via ORAL
  Filled 2018-03-10 (×8): qty 1

## 2018-03-10 MED ORDER — POTASSIUM CHLORIDE CRYS ER 20 MEQ PO TBCR: 60.0000 meq | EXTENDED_RELEASE_TABLET | Freq: Once | ORAL | Status: DC

## 2018-03-10 MED ORDER — MEPERIDINE HCL 50 MG/ML IJ SOLN: 6.2500 mg | INTRAMUSCULAR | Status: DC | PRN

## 2018-03-10 MED ORDER — PROPOFOL 500 MG/50ML IV EMUL
INTRAVENOUS | Status: DC | PRN
  Administered 2018-03-10: 25 ug/kg/min via INTRAVENOUS

## 2018-03-10 MED ORDER — METOCLOPRAMIDE HCL 10 MG PO TABS: 5.0000 mg | ORAL_TABLET | Freq: Three times a day (TID) | ORAL | Status: DC | PRN

## 2018-03-10 MED ORDER — OXYCODONE-ACETAMINOPHEN 5-325 MG PO TABS
1.0000 | ORAL_TABLET | Freq: Once | ORAL | Status: AC
  Administered 2018-03-10: 1 via ORAL

## 2018-03-10 MED ORDER — HYDROXYCHLOROQUINE SULFATE 200 MG PO TABS
400.0000 mg | ORAL_TABLET | Freq: Every day | ORAL | Status: DC
  Administered 2018-03-11 – 2018-03-14 (×4): 400 mg via ORAL
  Filled 2018-03-10 (×4): qty 2

## 2018-03-10 MED ORDER — OXYCODONE-ACETAMINOPHEN 5-325 MG PO TABS
ORAL_TABLET | ORAL | Status: AC
  Administered 2018-03-10: 1 via ORAL
  Filled 2018-03-10: qty 1

## 2018-03-10 MED ORDER — BACITRACIN 50000 UNITS IM SOLR
INTRAMUSCULAR | Status: AC
  Filled 2018-03-10: qty 1

## 2018-03-10 MED ORDER — OXYCODONE HCL 5 MG PO TABS: 5.0000 mg | ORAL_TABLET | Freq: Once | ORAL | Status: DC | PRN

## 2018-03-10 MED ORDER — PROPOFOL 10 MG/ML IV BOLUS
INTRAVENOUS | Status: DC | PRN
  Administered 2018-03-10 (×2): 20 mg via INTRAVENOUS

## 2018-03-10 MED ORDER — ONDANSETRON HCL 4 MG/2ML IJ SOLN
4.0000 mg | Freq: Four times a day (QID) | INTRAMUSCULAR | Status: DC | PRN
  Administered 2018-03-12: 4 mg via INTRAVENOUS
  Filled 2018-03-10: qty 2

## 2018-03-10 MED ORDER — PROMETHAZINE HCL 25 MG/ML IJ SOLN: 6.2500 mg | INTRAMUSCULAR | Status: DC | PRN

## 2018-03-10 MED ORDER — PREDNISONE 10 MG PO TABS
10.0000 mg | ORAL_TABLET | Freq: Every day | ORAL | Status: DC
  Administered 2018-03-11 – 2018-03-14 (×4): 10 mg via ORAL
  Filled 2018-03-10 (×4): qty 1

## 2018-03-10 MED ORDER — MIDAZOLAM HCL 2 MG/2ML IJ SOLN
INTRAMUSCULAR | Status: AC
  Filled 2018-03-10: qty 2

## 2018-03-10 MED ORDER — MORPHINE SULFATE (PF) 4 MG/ML IV SOLN
4.0000 mg | INTRAVENOUS | Status: DC | PRN
  Administered 2018-03-12: 4 mg via INTRAVENOUS
  Filled 2018-03-10: qty 1

## 2018-03-10 MED ORDER — FENTANYL CITRATE (PF) 100 MCG/2ML IJ SOLN: 25.0000 ug | INTRAMUSCULAR | Status: DC | PRN

## 2018-03-10 MED ORDER — CEFAZOLIN SODIUM-DEXTROSE 2-4 GM/100ML-% IV SOLN
INTRAVENOUS | Status: AC
  Filled 2018-03-10: qty 100

## 2018-03-10 MED ORDER — OXYCODONE HCL 5 MG/5ML PO SOLN: 5.0000 mg | Freq: Once | ORAL | Status: DC | PRN

## 2018-03-10 MED ORDER — ONDANSETRON HCL 4 MG/2ML IJ SOLN
4.0000 mg | Freq: Once | INTRAMUSCULAR | Status: AC
  Administered 2018-03-10: 4 mg via INTRAVENOUS
  Filled 2018-03-10: qty 2

## 2018-03-10 MED ORDER — EPHEDRINE SULFATE 50 MG/ML IJ SOLN
INTRAMUSCULAR | Status: DC | PRN
  Administered 2018-03-10: 5 mg via INTRAVENOUS

## 2018-03-10 MED ORDER — LACTATED RINGERS IV SOLN
INTRAVENOUS | Status: DC
  Administered 2018-03-10 (×3): via INTRAVENOUS

## 2018-03-10 MED ORDER — KETAMINE HCL 50 MG/ML IJ SOLN
INTRAMUSCULAR | Status: AC
  Filled 2018-03-10: qty 10

## 2018-03-10 MED ORDER — MYCOPHENOLATE MOFETIL 250 MG PO CAPS
1000.0000 mg | ORAL_CAPSULE | Freq: Two times a day (BID) | ORAL | Status: DC
  Administered 2018-03-10 – 2018-03-14 (×8): 1000 mg via ORAL
  Filled 2018-03-10 (×11): qty 4

## 2018-03-10 MED ORDER — PROPOFOL 500 MG/50ML IV EMUL
INTRAVENOUS | Status: AC
  Filled 2018-03-10: qty 50

## 2018-03-10 MED ORDER — LACTATED RINGERS IV SOLN
INTRAVENOUS | Status: DC | PRN
  Administered 2018-03-10: 14:00:00 via INTRAVENOUS

## 2018-03-10 MED ORDER — MORPHINE SULFATE (PF) 2 MG/ML IV SOLN
2.0000 mg | Freq: Once | INTRAVENOUS | Status: AC
  Administered 2018-03-10: 2 mg via INTRAVENOUS

## 2018-03-10 MED ORDER — FENTANYL CITRATE (PF) 100 MCG/2ML IJ SOLN
INTRAMUSCULAR | Status: DC | PRN
  Administered 2018-03-10: 50 ug via INTRAVENOUS

## 2018-03-10 MED ORDER — ONDANSETRON HCL 4 MG/2ML IJ SOLN: 4.0000 mg | Freq: Four times a day (QID) | INTRAMUSCULAR | Status: DC | PRN

## 2018-03-10 MED ORDER — OXYCODONE HCL 5 MG PO TABS
5.0000 mg | ORAL_TABLET | ORAL | Status: DC | PRN
  Administered 2018-03-10 – 2018-03-14 (×14): 5 mg via ORAL
  Filled 2018-03-10 (×16): qty 1

## 2018-03-10 MED ORDER — ACETAMINOPHEN 10 MG/ML IV SOLN
INTRAVENOUS | Status: DC | PRN
  Administered 2018-03-10: 1000 mg via INTRAVENOUS

## 2018-03-10 MED ORDER — SODIUM CHLORIDE 0.9 % IV SOLN
INTRAVENOUS | Status: DC | PRN
  Administered 2018-03-10: 20 ug/min via INTRAVENOUS

## 2018-03-10 MED ORDER — METOCLOPRAMIDE HCL 5 MG/ML IJ SOLN: 5.0000 mg | Freq: Three times a day (TID) | INTRAMUSCULAR | Status: DC | PRN

## 2018-03-10 MED ORDER — CLONAZEPAM 0.5 MG PO TABS: 0.5000 mg | ORAL_TABLET | Freq: Two times a day (BID) | ORAL | Status: DC | PRN

## 2018-03-10 MED ORDER — PHENYLEPHRINE HCL 10 MG/ML IJ SOLN
INTRAMUSCULAR | Status: DC | PRN
  Administered 2018-03-10 (×2): 100 ug via INTRAVENOUS

## 2018-03-10 MED ORDER — ACETAMINOPHEN 650 MG RE SUPP: 650.0000 mg | Freq: Four times a day (QID) | RECTAL | Status: DC | PRN

## 2018-03-10 MED ORDER — ACETAMINOPHEN 325 MG PO TABS
650.0000 mg | ORAL_TABLET | Freq: Four times a day (QID) | ORAL | Status: DC | PRN
  Administered 2018-03-10 – 2018-03-11 (×3): 650 mg via ORAL
  Filled 2018-03-10 (×3): qty 2

## 2018-03-10 MED ORDER — SODIUM CHLORIDE 0.9 % IV SOLN: INTRAVENOUS | Status: DC | PRN

## 2018-03-10 MED ORDER — FENTANYL CITRATE (PF) 100 MCG/2ML IJ SOLN
INTRAMUSCULAR | Status: AC
  Filled 2018-03-10: qty 2

## 2018-03-10 MED ORDER — ONDANSETRON HCL 4 MG PO TABS
4.0000 mg | ORAL_TABLET | Freq: Four times a day (QID) | ORAL | Status: DC | PRN
  Administered 2018-03-11: 4 mg via ORAL
  Filled 2018-03-10: qty 1

## 2018-03-10 MED ORDER — VASOPRESSIN 20 UNIT/ML IV SOLN
INTRAVENOUS | Status: AC
  Filled 2018-03-10: qty 1

## 2018-03-10 MED ORDER — SODIUM CHLORIDE FLUSH 0.9 % IV SOLN
INTRAVENOUS | Status: AC
  Filled 2018-03-10: qty 10

## 2018-03-10 MED ORDER — SODIUM CHLORIDE 0.9 % IR SOLN
Status: DC | PRN
  Administered 2018-03-10: 200 mL

## 2018-03-10 MED ORDER — MIDAZOLAM HCL 5 MG/5ML IJ SOLN
INTRAMUSCULAR | Status: DC | PRN
  Administered 2018-03-10: 2 mg via INTRAVENOUS

## 2018-03-10 MED ORDER — VASOPRESSIN 20 UNIT/ML IV SOLN
INTRAVENOUS | Status: DC | PRN
  Administered 2018-03-10: 2 [IU] via INTRAVENOUS

## 2018-03-10 MED ORDER — ZOLPIDEM TARTRATE 5 MG PO TABS: 5.0000 mg | ORAL_TABLET | Freq: Every evening | ORAL | Status: DC | PRN

## 2018-03-10 SURGICAL SUPPLY — 39 items
BIT DRILL SHORT 4.2 (BIT) IMPLANT
BLADE 85 HELICAL TFNA (Anchor) ×2 IMPLANT
BNDG COHESIVE 4X5 TAN STRL (GAUZE/BANDAGES/DRESSINGS) ×4 IMPLANT
BRUSH SCRUB EZ  4% CHG (MISCELLANEOUS) ×4
BRUSH SCRUB EZ 4% CHG (MISCELLANEOUS) ×2 IMPLANT
CANISTER SUCT 1200ML W/VALVE (MISCELLANEOUS) ×3 IMPLANT
CHLORAPREP W/TINT 26ML (MISCELLANEOUS) ×3 IMPLANT
COVER WAND RF STERILE (DRAPES) ×3 IMPLANT
DRAPE SHEET LG 3/4 BI-LAMINATE (DRAPES) ×3 IMPLANT
DRAPE U-SHAPE 47X51 STRL (DRAPES) ×3 IMPLANT
DRESSING ALLEVYN LIFE SACRUM (GAUZE/BANDAGES/DRESSINGS) ×2 IMPLANT
DRILL BIT SHORT 4.2 (BIT) ×3
DRSG OPSITE POSTOP 3X4 (GAUZE/BANDAGES/DRESSINGS) ×3 IMPLANT
DRSG OPSITE POSTOP 4X6 (GAUZE/BANDAGES/DRESSINGS) ×2 IMPLANT
DRSG OPSITE POSTOP 4X8 (GAUZE/BANDAGES/DRESSINGS) ×2 IMPLANT
ELECT REM PT RETURN 9FT ADLT (ELECTROSURGICAL) ×3
ELECTRODE REM PT RTRN 9FT ADLT (ELECTROSURGICAL) ×1 IMPLANT
GAUZE PETRO XEROFOAM 1X8 (MISCELLANEOUS) ×3 IMPLANT
GLOVE INDICATOR 8.0 STRL GRN (GLOVE) ×5 IMPLANT
GLOVE SURG ORTHO 8.0 STRL STRW (GLOVE) ×5 IMPLANT
GOWN STRL REUS W/ TWL LRG LVL3 (GOWN DISPOSABLE) ×1 IMPLANT
GOWN STRL REUS W/ TWL XL LVL3 (GOWN DISPOSABLE) ×1 IMPLANT
GOWN STRL REUS W/TWL LRG LVL3 (GOWN DISPOSABLE) ×3
GOWN STRL REUS W/TWL XL LVL3 (GOWN DISPOSABLE) ×3
GUIDEWIRE 3.2X400 (WIRE) ×4 IMPLANT
KIT PATIENT CARE HANA TABLE (KITS) ×3 IMPLANT
KIT TURNOVER CYSTO (KITS) ×3 IMPLANT
MAT ABSORB  FLUID 56X50 GRAY (MISCELLANEOUS) ×2
MAT ABSORB FLUID 56X50 GRAY (MISCELLANEOUS) ×1 IMPLANT
NAIL CANN TFNA 9MM/130 360MM (Nail) ×2 IMPLANT
NS IRRIG 1000ML POUR BTL (IV SOLUTION) ×3 IMPLANT
PACK HIP COMPR (MISCELLANEOUS) ×3 IMPLANT
REAMER ROD DEEP FLUTE 2.5X950 (INSTRUMENTS) ×2 IMPLANT
SCREW LOCKING 5.0MMX40MM (Screw) ×4 IMPLANT
STAPLER SKIN PROX 35W (STAPLE) ×3 IMPLANT
SUT VIC AB 0 CT1 36 (SUTURE) ×7 IMPLANT
SUT VIC AB 2-0 CT1 27 (SUTURE) ×3
SUT VIC AB 2-0 CT1 TAPERPNT 27 (SUTURE) ×1 IMPLANT
TOWEL OR 17X26 4PK STRL BLUE (TOWEL DISPOSABLE) ×3 IMPLANT

## 2018-03-10 NOTE — Anesthesia Post-op Follow-up Note (Signed)
Anesthesia QCDR form completed.        

## 2018-03-10 NOTE — Clinical Social Work Placement (Signed)
   CLINICAL SOCIAL WORK PLACEMENT  NOTE  Date:  03/10/2018  Patient Details  Name: Melanie Rollins MRN: [id number] Date of Birth: 25-Oct-1945  Clinical Social Work is seeking post-discharge placement for this patient at the [place] level of care (*CSW will initial, date and re-position this form in  chart as items are completed):  Yes   Patient/family provided with [place] Work Department's list of facilities offering this level of care within the geographic area requested by the patient (or if unable, by the patient's family).  Yes   Patient/family informed of their freedom to choose among providers that offer the needed level of care, that participate in Medicare, Medicaid or managed care program needed by the patient, have an available bed and are willing to accept the patient.  Yes   Patient/family informed of [place]'s ownership interest in [facility] and [facility], as well as of the fact that they are under no obligation to receive care at these facilities.  PASRR submitted to EDS on 03/10/18     PASRR number received on 03/10/18     Existing PASRR number confirmed on       FL2 transmitted to all facilities in geographic area requested by pt/family on 03/10/18     FL2 transmitted to all facilities within larger geographic area on       Patient informed that his/her managed care company has contracts with or will negotiate with certain facilities, including the following:            Patient/family informed of bed offers received.  Patient chooses bed at       Physician recommends and patient chooses bed at      Patient to be transferred to   on  .  Patient to be transferred to facility by       Patient family notified on   of transfer.  Name of family member notified:        PHYSICIAN       Additional Comment:    _______________________________________________ Dion Parrow, Veronia Beets, LCSW 03/10/2018, 5:00 PM

## 2018-03-10 NOTE — Consult Note (Signed)
ORTHOPAEDIC CONSULTATION  REQUESTING PHYSICIAN: Dustin Flock, MD  Chief Complaint: left hip pain  HPI: Melanie Rollins is a 72 y.o. female who complains of left hip pain after a fall. Please see H&P and ED notes for details. Denies any numbness, tingling or constitutional symptoms.  Past Medical History:  Diagnosis Date  . Arthritis    RA  . Hyperlipidemia   . Pulmonary fibrosis ([place])   . Reynolds syndrome [facility])    Past Surgical History:  Procedure Laterality Date  . kidney stones    . LUNG BIOPSY     Social History   Socioeconomic History  . Marital status: Married    Spouse name: Not on file  . Number of children: Not on file  . Years of education: Not on file  . Highest education level: Not on file  Occupational History  . Not on file  Social Needs  . Financial resource strain: Not hard at all  . Food insecurity:    Worry: Never true    Inability: Never true  . Transportation needs:    Medical: No    Non-medical: No  Tobacco Use  . Smoking status: Never Smoker  . Smokeless tobacco: Never Used  Substance and Sexual Activity  . Alcohol use: Yes    Alcohol/week: 0.0 standard drinks    Comment: pt states she drinks wine on occasion  . Drug use: No  . Sexual activity: Yes  Lifestyle  . Physical activity:    Days per week: 0 days    Minutes per session: 0 min  . Stress: Not at all  Relationships  . Social connections:    Talks on phone: More than three times a week    Gets together: More than three times a week    Attends religious service: More than 4 times per year    Active member of club or organization: Yes    Attends meetings of clubs or organizations: More than 4 times per year    Relationship status: Married  Other Topics Concern  . Not on file  Social History Narrative  . Not on file   Family History  Problem Relation Age of Onset  . Hypertension Mother   . Heart disease Mother        pacemaker and CHF  . Glaucoma Mother   .  Hypertension Brother   . Arthritis Sister   . Heart disease Maternal Grandmother   . Heart disease Maternal Grandfather   . Hypertension Paternal Grandmother   . Stroke Paternal Grandmother   . Cancer Paternal Grandfather    Allergies  Allergen Reactions  . Imuran [Azathioprine] Nausea Only and Other (See Comments)    Cold chills and bruise like welps   Prior to Admission medications   Medication Sig Start Date End Date Taking? Authorizing Provider  Acetylcysteine (NAC) 600 MG CAPS Take 600 mg by mouth 2 (two) times daily.   Yes [provider]  alendronate (FOSAMAX) 70 MG tablet Take 70 mg by mouth once a week. Take with a full glass of water on an empty stomach.   Yes [provider]  Biotin 10000 MCG TBDP Take 10,000 mcg by mouth daily.   Yes [provider]  Calcium Citrate-Vitamin D (CALCIUM + D PO) Take 600 mg by mouth daily.   Yes [provider]  Cholecalciferol (VITAMIN D) 2000 units CAPS Take 2,000 Units by mouth daily.   Yes [provider]  CINNAMON PO Take 1,000 mg by  mouth daily.   Yes [provider]  cycloSPORINE (RESTASIS) 0.05 % ophthalmic emulsion Place 1 drop into both eyes 2 (two) times daily.   Yes [provider]  hydroxychloroquine (PLAQUENIL) 200 MG tablet 400 mg.  05/28/16  Yes [provider]  L-Lysine 500 MG TABS Take 500 mg by mouth daily.   Yes [provider]  Magnesium 250 MG TABS Take 250 mg by mouth daily.    Yes [provider]  Multiple Vitamin (MULTIVITAMIN) tablet Take 1 tablet by mouth daily.   Yes [provider]  mycophenolate (CELLCEPT) 500 MG tablet Take 2 tablets by mouth 2 (two) times daily.   Yes [provider]  Omega-3 Fatty Acids (FISH OIL) 1000 MG CAPS Take 1,000 mg by mouth daily.   Yes [provider]  omeprazole (PRILOSEC) 20 MG capsule Take 1 capsule (20 mg total) by mouth daily. 01/13/18  Yes Johnson, Megan P, DO   predniSONE (DELTASONE) 10 MG tablet Take 10 mg by mouth daily.   Yes [provider]  sertraline (ZOLOFT) 50 MG tablet Take 1 tablet (50 mg total) by mouth daily. 01/13/18  Yes Johnson, Megan P, DO  zolpidem (AMBIEN) 10 MG tablet Take 0.5 tablets (5 mg total) by mouth at bedtime as needed for sleep. 10/25/17  Yes Kathrine Haddock, NP   Dg Chest 1 View  Result Date: 03/09/2018 CLINICAL DATA:  Left hip fracture.  History of pulmonary fibrosis. EXAM: CHEST  1 VIEW COMPARISON:  Chest CT 05/08/2015. FINDINGS: Peripheral reticulation, left greater than right with slight basilar predominant gradient, similar to prior CT. Chronic blunting of left costophrenic angle with surgical sutures. No superimposed airspace disease. Unchanged heart size and mediastinal contours, borderline cardiomegaly. No pneumothorax. No visualized rib fractures. Scoliotic curvature of the spine. IMPRESSION: Interstitial lung disease, grossly unchanged from 2016 CT allowing for differences in modality. No acute superimposed abnormality. Electronically Signed   By: Keith Rake M.D.   On: 03/09/2018 23:51   Dg Hip Unilat With Pelvis 2-3 Views Left  Result Date: 03/09/2018 CLINICAL DATA:  Left hip pain after fall out of bed. EXAM: DG HIP (WITH OR WITHOUT PELVIS) 2-3V LEFT COMPARISON:  None. FINDINGS: Comminuted displaced left proximal femur fracture. Fracture involves the proximal shaft, subtrochanteric and lesser trochanteric regions. There is osseous distraction of 4 cm about the femoral shaft component. Displacement of 7 mm about the lesser trochanteric component. Apex lateral angulation. Femoral head remains seated. Pubic rami are intact. Pubic symphysis and sacroiliac joints are congruent. IMPRESSION: Comminuted displaced left proximal femur fracture involving the proximal femoral shaft, subtrochanteric and lesser trochanteric portions. Significant displacement with mild apex lateral angulation. Electronically Signed   By:  Keith Rake M.D.   On: 03/09/2018 23:47    Positive ROS: All other systems have been reviewed and were otherwise negative with the exception of those mentioned in the HPI and as above.  Physical Exam: General: Alert, no acute distress Cardiovascular: No pedal edema Respiratory: No cyanosis, no use of accessory musculature GI: No organomegaly, abdomen is soft and non-tender Skin: No lesions in the area of chief complaint Neurologic: Sensation intact distally Psychiatric: Patient is competent for consent with normal mood and affect Lymphatic: No axillary or cervical lymphadenopathy  MUSCULOSKELETAL: Pain with any left hip movement. Knee is non-tender. Compartments soft. Good cap refill. Motor and sensory intact distally. RLE FROM and non-tender  Assessment: Left hip subtrochanteric hip fracture  Plan: Plan trochanteric femoral nailing.  The diagnosis, risks, benefits  and alternatives to treatment are all discussed in detail with the patient and family. Risks include but are not limited to bleeding, infection, deep vein thrombosis, pulmonary embolism, nerve or vascular injury, non-union, repeat operation, persistent pain, weakness, stiffness and death. She understands and is eager to proceed.     Lovell Sheehan, MD    03/10/2018 11:07 AM

## 2018-03-10 NOTE — Anesthesia Preprocedure Evaluation (Addendum)
Anesthesia Evaluation  Patient identified by MRN, date of birth, ID band Patient awake    Reviewed: Allergy & Precautions, NPO status , Patient's Chart, lab work & pertinent test results  History of Anesthesia Complications Negative for: history of anesthetic complications  Airway Mallampati: II  TM Distance: >3 FB Neck ROM: Full    Dental no notable dental hx.    Pulmonary neg sleep apnea, neg COPD,  Pulmonary fibrosis, on home O2, primarily uses 2L at night but also uses O2 during the day if she is particularly active   breath sounds clear to auscultation- rhonchi (-) wheezing      Cardiovascular (-) hypertension(-) CAD, (-) Past MI, (-) Cardiac Stents and (-) CABG  Rhythm:Regular Rate:Normal - Systolic murmurs and - Diastolic murmurs    Neuro/Psych PSYCHIATRIC DISORDERS Anxiety Depression negative neurological ROS     GI/Hepatic negative GI ROS, Neg liver ROS,   Endo/Other  negative endocrine ROSneg diabetes  Renal/GU Renal disease: hx of nephrolithiasis.     Musculoskeletal  (+) Arthritis ,   Abdominal (+) - obese,   Peds  Hematology negative hematology ROS (+)   Anesthesia Other Findings Past Medical History: No date: Arthritis     Comment:  RA No date: Hyperlipidemia No date: Pulmonary fibrosis (HCC) 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[facility])   Reproductive/Obstetrics                             Lab Results  Component Value Date   WBC 15.2 (H) 03/10/2018   HGB 10.3 (L) 03/10/2018   HCT 31.8 (L) 03/10/2018   MCV 91.6 03/10/2018   PLT 234 03/10/2018    Anesthesia Physical Anesthesia Plan  ASA: III  Anesthesia Plan: Spinal   Post-op Pain Management:    Induction:   PONV Risk Score and Plan: 2 and Propofol infusion  Airway Management Planned: Natural Airway and Simple Face Mask  Additional Equipment:   Intra-op Plan:   Post-operative Plan:   Informed Consent: I have  reviewed the patients History and Physical, chart, labs and discussed the procedure including the risks, benefits and alternatives for the proposed anesthesia with the patient or authorized representative who has indicated his/her understanding and acceptance.   Dental advisory given  Plan Discussed with: CRNA and Anesthesiologist  Anesthesia Plan Comments:         Anesthesia Quick Evaluation

## 2018-03-10 NOTE — Op Note (Signed)
DATE OF SURGERY:  03/10/2018  TIME: 4:01 PM  PATIENT NAME:  Melanie Rollins  AGE: 72 y.o.  PRE-OPERATIVE DIAGNOSIS:  LEFT HIP FRACTURE  POST-OPERATIVE DIAGNOSIS:  SAME  PROCEDURE:  INTRAMEDULLARY (IM) NAIL INTERTROCHANTRIC-LEFT TFNA  SURGEON:  Lovell Sheehan  EBL:  859 cc  COMPLICATIONS:  None apparent  OPERATIVE IMPLANTS: Synthes trochanteric femoral nail 360 mm by 9 mm  with interlocking helical blade  85 mm, 2 cross-lock bolts 5.0 x 40 mm  PREOPERATIVE INDICATIONS:  [name] is a 72 y.o. year old who fell and suffered a hip fracture. She was brought into the ER and then admitted and optimized and then elected for surgical intervention.    The risks benefits and alternatives were discussed with the patient including but not limited to the risks of nonoperative treatment, versus surgical intervention including infection, bleeding, nerve injury, malunion, nonunion, hardware prominence, hardware failure, need for hardware removal, blood clots, cardiopulmonary complications, morbidity, mortality, among others, and they were willing to proceed.    OPERATIVE PROCEDURE:  The patient was brought to the operating room and placed in the supine position.  Spinal anesthesia was administered, with a foley. She was placed on the fracture table.  Closed reduction was performed under C-arm guidance. The length of the femur was also measured using fluoroscopy. Time out was then performed after sterile prep and drape. She received preoperative antibiotics.  Incision was made proximal to the greater trochanter. A guidewire was placed in the appropriate position. Confirmation was made on AP and lateral views. The above-named nail was opened. I opened the proximal femur with a reamer. I then placed the nail by hand easily down. I did not need to ream the femur.  Once the nail was completely seated, I placed a guidepin into the femoral head into the center center position through a second  incision.  I measured the length, and then reamed the lateral cortex and up into the head. I then placed the helical blade. Slight compression was applied. Anatomic fixation achieved. Bone quality was poor.  I then secured the proximal interlock. Next, using perfect circle technique, two distal bolts were placed.  I then removed the instruments, and took final C-arm pictures AP and lateral the entire length of the leg. Anatomic reconstruction was achieved, and the wounds were irrigated copiously and closed with Vicryl  followed by staples and dry sterile dressing. Sponge and needle count were correct.   The patient was awakened and returned to PACU in stable and satisfactory condition. There no complications and the patient tolerated the procedure well.  She will be weightbearing as tolerated.    Lovell Sheehan

## 2018-03-10 NOTE — Clinical Social Work Note (Signed)
Clinical Social Work Assessment  Patient Details  Name: Melanie Rollins MRN: [id number] Date of Birth: 1945/06/19  Date of referral:  03/10/18               Reason for consult:  Facility Placement                Permission sought to share information with:  Chartered certified accountant granted to share information::  Yes, Verbal Permission Granted  Name::      [place]::   [place]   Relationship::     Contact Information:     Housing/Transportation Living arrangements for the past 2 months:  [place] of Information:  Patient Patient Interpreter Needed:  None Criminal Activity/Legal Involvement Pertinent to Current Situation/Hospitalization:  No - Comment as needed Significant Relationships:  Adult Children, Spouse Lives with:  Spouse Do you feel safe going back to the place where you live?  Yes Need for family participation in patient care:  Yes (Comment)  Care giving concerns:  Patient lives in [place] with her husband Melanie Rollins.    Social Worker assessment / plan:  Holiday representative ([place]) reviewed chart and noted that patient has a hip fracture. Surgery and PT are pending. CSW met with patient prior to surgery today and her husband Melanie Rollins was at bedside. Patient was alert and oriented X4 and was laying in the bed. CSW introduced self and explained role of CSW department. Per patient she lives in [place] with her husband. CSW explained that PT will evaluate her and make a recommendation of home health or SNF. CSW explained that medicare requires a 3 night qualifying inpatient stay in a hospital in order to pay for SNF. Patient was admitted to inpatient 03/10/18. Patient verbalized her understanding and is agreeable to SNF search in [place]. Patient prefers [facility] if SNF is needed. FL2 complete and faxed out. Medicare bundle case manager aware of above. CSW will continue to follow and assist as needed.   Employment  status:  Retired Forensic scientist:  Medicare PT Recommendations:  Not assessed at this time Needles / Referral to community resources:  [place]  Patient/Family's Response to care:  Patient is agreeable to [facility] in [place].   Patient/Family's Understanding of and Emotional Response to Diagnosis, Current Treatment, and Prognosis:  Patient was very pleasant and thanked CSW for assistance.   Emotional Assessment Appearance:  Appears stated age Attitude/Demeanor/Rapport:    Affect (typically observed):  Accepting, Adaptable, Pleasant Orientation:  Oriented to Self, Oriented to Place, Oriented to  Time, Oriented to Situation Alcohol / Substance use:  Not Applicable Psych involvement (Current and /or in the community):  No (Comment)  Discharge Needs  Concerns to be addressed:  Discharge Planning Concerns Readmission within the last 30 days:  No Current discharge risk:  Dependent with Mobility Barriers to Discharge:  Continued Medical Work up   [employer], Veronia Beets, LCSW 03/10/2018, 5:01 PM

## 2018-03-10 NOTE — H&P (Signed)
[facility] [facility] [place] NAME: Patra Gherardi    MR#:  [id number]  DATE OF BIRTH:  [birth year]  DATE OF ADMISSION:  03/09/2018  PRIMARY CARE PHYSICIAN: Valerie Roys, DO   REQUESTING/REFERRING PHYSICIAN: Owens Shark, MD  CHIEF COMPLAINT:   Chief Complaint  Patient presents with  . Hip Pain  . Fall    HISTORY OF PRESENT ILLNESS:  Melanie Rollins  is a 72 y.o. female who presents with chief complaint as above.  Patient had a mechanical fall at home from her bed with subsequent left hip pain.  Here in the ED she was found to have left hip fracture.  Orthopedic surgery contacted and plans for operative repair.  Hospitalist called for admission  PAST MEDICAL HISTORY:   Past Medical History:  Diagnosis Date  . Arthritis    RA  . Hyperlipidemia   . Pulmonary fibrosis ([place])   . Reynolds syndrome [facility])      PAST SURGICAL HISTORY:   Past Surgical History:  Procedure Laterality Date  . kidney stones    . LUNG BIOPSY       SOCIAL HISTORY:   Social History   Tobacco Use  . Smoking status: Never Smoker  . Smokeless tobacco: Never Used  Substance Use Topics  . Alcohol use: Yes    Alcohol/week: 0.0 standard drinks    Comment: pt states she drinks wine on occasion     FAMILY HISTORY:   Family History  Problem Relation Age of Onset  . Hypertension Mother   . Heart disease Mother        pacemaker and CHF  . Glaucoma Mother   . Hypertension Brother   . Arthritis Sister   . Heart disease Maternal Grandmother   . Heart disease Maternal Grandfather   . Hypertension Paternal Grandmother   . Stroke Paternal Grandmother   . Cancer Paternal Grandfather      DRUG ALLERGIES:   Allergies  Allergen Reactions  . Imuran [Azathioprine] Nausea Only and Other (See Comments)    Cold chills and bruise like welps    MEDICATIONS AT HOME:   Prior to Admission medications   Medication Sig Start Date End Date Taking? Authorizing  Provider  Acetylcysteine (NAC) 600 MG CAPS Take 600 mg by mouth 2 (two) times daily.    [provider]  acyclovir ointment (ZOVIRAX) 5 % as needed. 07/06/16   [provider]  alendronate (FOSAMAX) 70 MG tablet Take 70 mg by mouth once a week. Take with a full glass of water on an empty stomach.    [provider]  Biotin 10000 MCG TBDP Take 10,000 mcg by mouth daily.    [provider]  Calcium Citrate-Vitamin D (CALCIUM + D PO) Take 600 mg by mouth daily.    [provider]  Cholecalciferol (VITAMIN D) 2000 units CAPS Take 2,000 Units by mouth daily.    [provider]  CINNAMON PO Take 1,000 mg by mouth daily.    [provider]  clonazePAM (KLONOPIN) 0.5 MG tablet Take 0.5 mg by mouth 2 (two) times daily as needed for anxiety.    [provider]  clotrimazole-betamethasone (LOTRISONE) cream Apply 1 application topically 2 (two) times daily. 12/10/16   Kathrine Haddock, NP  etodolac (LODINE) 200 MG capsule Take 200 mg by mouth every 8 (eight) hours as needed.    [provider]  hydroxychloroquine (PLAQUENIL) 200 MG tablet Take 2 tablets (400 mg total)  by mouth once daily. 05/28/16   [provider]  L-Lysine 500 MG TABS Take 500 mg by mouth daily.    [provider]  Magnesium 250 MG TABS Take by mouth daily.    [provider]  Multiple Vitamin (MULTIVITAMIN) tablet Take 1 tablet by mouth daily.    [provider]  mycophenolate (CELLCEPT) 500 MG tablet Take 2 tablets by mouth 2 (two) times daily.    [provider]  Omega-3 Fatty Acids (FISH OIL) 1000 MG CAPS Take 1,000 mg by mouth daily.    [provider]  omeprazole (PRILOSEC) 20 MG capsule Take 1 capsule (20 mg total) by mouth daily. 01/13/18   Johnson, Megan P, DO  penciclovir (DENAVIR) 1 % cream Apply 1 application topically every 2 (two) hours. 07/06/16   Kathrine Haddock, NP  predniSONE (DELTASONE) 10 MG  tablet Take 10 mg by mouth daily.    [provider]  sertraline (ZOLOFT) 50 MG tablet Take 1 tablet (50 mg total) by mouth daily. 01/13/18   Johnson, Megan P, DO  tiZANidine (ZANAFLEX) 4 MG tablet Take 1 tablet (4 mg total) by mouth every 8 (eight) hours as needed for muscle spasms. 11/28/17   Johnson, Megan P, DO  zolpidem (AMBIEN) 10 MG tablet Take 0.5 tablets (5 mg total) by mouth at bedtime as needed for sleep. 10/25/17   Kathrine Haddock, NP    REVIEW OF SYSTEMS:  Review of Systems  Constitutional: Negative for chills, fever, malaise/fatigue and weight loss.  HENT: Negative for ear pain, hearing loss and tinnitus.   Eyes: Negative for blurred vision, double vision, pain and redness.  Respiratory: Negative for cough, hemoptysis and shortness of breath.   Cardiovascular: Negative for chest pain, palpitations, orthopnea and leg swelling.  Gastrointestinal: Negative for abdominal pain, constipation, diarrhea, nausea and vomiting.  Genitourinary: Negative for dysuria, frequency and hematuria.  Musculoskeletal: Positive for joint pain (Left hip). Negative for back pain and neck pain.  Skin:       No acne, rash, or lesions  Neurological: Negative for dizziness, tremors, focal weakness and weakness.  Endo/Heme/Allergies: Negative for polydipsia. Does not bruise/bleed easily.  Psychiatric/Behavioral: Negative for depression. The patient is not nervous/anxious and does not have insomnia.      VITAL SIGNS:   Vitals:   03/09/18 2256 03/09/18 2302 03/09/18 2357 03/10/18 0005  BP:  132/61 122/62 130/63  Pulse:  68 67 (!) 58  Resp:  _0 Temp:      TempSrc:      SpO2:  100% 98% 100%  Weight: 63.5 kg     Height: 5' 3.5" (1.613 m)      Wt Readings from Last 3 Encounters:  03/09/18 63.5 kg  01/13/18 65.1 kg  12/13/17 64.8 kg    PHYSICAL EXAMINATION:  Physical Exam  LABORATORY PANEL:   CBC Recent Labs  Lab 03/09/18 2301  WBC 7.7  HGB 10.6*  HCT 33.4*  PLT 235    ------------------------------------------------------------------------------------------------------------------  Chemistries  Recent Labs  Lab 03/09/18 2301  NA 134*  K 3.9  CL 102  CO2 24  GLUCOSE 114*  BUN 9  CREATININE 0.68  CALCIUM 8.2*   ------------------------------------------------------------------------------------------------------------------  Cardiac Enzymes No results for input(s): TROPONINI in the last 168 hours. ------------------------------------------------------------------------------------------------------------------  RADIOLOGY:  Dg Chest 1 View  Result Date: 03/09/2018 CLINICAL DATA:  Left hip fracture.  History of pulmonary fibrosis. EXAM: CHEST  1 VIEW COMPARISON:  Chest CT 05/08/2015. FINDINGS: Peripheral reticulation, left  greater than right with slight basilar predominant gradient, similar to prior CT. Chronic blunting of left costophrenic angle with surgical sutures. No superimposed airspace disease. Unchanged heart size and mediastinal contours, borderline cardiomegaly. No pneumothorax. No visualized rib fractures. Scoliotic curvature of the spine. IMPRESSION: Interstitial lung disease, grossly unchanged from 2016 CT allowing for differences in modality. No acute superimposed abnormality. Electronically Signed   By: Keith Rake M.D.   On: 03/09/2018 23:51   Dg Hip Unilat With Pelvis 2-3 Views Left  Result Date: 03/09/2018 CLINICAL DATA:  Left hip pain after fall out of bed. EXAM: DG HIP (WITH OR WITHOUT PELVIS) 2-3V LEFT COMPARISON:  None. FINDINGS: Comminuted displaced left proximal femur fracture. Fracture involves the proximal shaft, subtrochanteric and lesser trochanteric regions. There is osseous distraction of 4 cm about the femoral shaft component. Displacement of 7 mm about the lesser trochanteric component. Apex lateral angulation. Femoral head remains seated. Pubic rami are intact. Pubic symphysis and sacroiliac joints are  congruent. IMPRESSION: Comminuted displaced left proximal femur fracture involving the proximal femoral shaft, subtrochanteric and lesser trochanteric portions. Significant displacement with mild apex lateral angulation. Electronically Signed   By: Keith Rake M.D.   On: 03/09/2018 23:47    EKG:   Orders placed or performed during the hospital encounter of 03/09/18  . ED EKG  . ED EKG  . EKG 12-Lead  . EKG 12-Lead    IMPRESSION AND PLAN:  Principal Problem:   Closed left hip fracture [facility]) -orthopedic surgery consulted, defer to their recommendation for further treatment including likely operative repair Active Problems:   Pulmonary fibrosis ([place]) -continue home meds   Anxiety and depression -continue home dose anxiolytics and antidepressant   Hyperlipidemia -Home dose antilipid  Chart review performed and case discussed with ED provider. Labs, imaging and/or ECG reviewed by provider and discussed with patient/family. Management plans discussed with the patient and/or family.  DVT PROPHYLAXIS: Mechanical only  GI PROPHYLAXIS:  PPI   ADMISSION STATUS: Inpatient     CODE STATUS: Full Advance Directive Documentation     Most Recent Value  Type of Advance Directive  Living will, Healthcare Power of Attorney  Pre-existing out of facility DNR order (yellow form or pink MOST form)  -  "MOST" Form in Place?  -      TOTAL TIME TAKING CARE OF THIS PATIENT: 45 minutes.   Elowen Debruyn [place] 03/10/2018, 12:28 AM  [facility]  Office  [phone]  CC: Primary care physician; Valerie Roys, DO  Note:  This document was prepared using Dragon voice recognition software and may include unintentional dictation errors.

## 2018-03-10 NOTE — Progress Notes (Signed)
Patient admitted with hip fracture plan for surgery Patient seen and briefly discussed with her

## 2018-03-10 NOTE — Anesthesia Procedure Notes (Signed)
Spinal  Patient location during procedure: OR Start time: 03/10/2018 2:10 PM End time: 03/10/2018 2:18 PM Staffing Anesthesiologist: Emmie Niemann, MD Performed: anesthesiologist  Preanesthetic Checklist Completed: patient identified, site marked, surgical consent, pre-op evaluation, timeout performed, IV checked, risks and benefits discussed and monitors and equipment checked Spinal Block Patient position: right lateral decubitus Prep: ChloraPrep Patient monitoring: heart rate, continuous pulse ox, blood pressure and cardiac monitor Approach: midline Location: L4-5 Injection technique: single-shot Needle Needle type: Introducer and Pencil-Tip  Needle gauge: 24 G Needle length: 9 cm Additional Notes Negative paresthesia. Negative blood return. Positive free-flowing CSF. Expiration date of kit checked and confirmed. Patient tolerated procedure well, without complications.

## 2018-03-10 NOTE — Anesthesia Procedure Notes (Signed)
Date/Time: 03/10/2018 1:40 PM Performed by: Allean Found, CRNA Pre-anesthesia Checklist: Patient identified, Emergency Drugs available, Suction available, Patient being monitored and Timeout performed Oxygen Delivery Method: Nasal cannula Placement Confirmation: positive ETCO2

## 2018-03-10 NOTE — NC FL2 (Signed)
[place] LEVEL OF CARE SCREENING TOOL     IDENTIFICATION  Patient Name: Melanie Rollins Birthdate: Jan 17, 1946 Sex: female Admission Date (Current Location): 03/09/2018  [place] and [place] Number:  Engineering geologist and Address:  [facility], [address] [place], [place] [address]      Provider Number: [id number]  Attending Physician Name and Address:  Dustin Flock, MD  Relative Name and Phone Number:       Current Level of Care: Hospital Recommended Level of Care: [place] Prior Approval Number:    Date Approved/Denied:   PASRR Number: [id number])  Discharge Plan: SNF    Current Diagnoses: Patient Active Problem List   Diagnosis Date Noted  . Closed left hip fracture ([place]) 03/10/2018  . Facial pain 12/13/2017  . Elevated liver enzymes 12/13/2017  . 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 Near syncope 10/10/2017  . Cervical pain (neck) 06/17/2017  . Thoracic spine pain 06/17/2017  . Advanced care planning/counseling discussion 07/06/2016  . Osteopenia 07/06/2016  . Nephrolithiasis 03/14/2015  . 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 Pulmonary fibrosis ([place]) 03/14/2015  . Anxiety and depression 03/14/2015  . Hyperlipidemia 03/14/2015  . Insomnia 03/14/2015  . Osteoarthritis 03/14/2015  . Atrophic vaginitis 03/14/2015    Orientation RESPIRATION BLADDER Height & Weight     Self, Time, Situation, Place  O2(2 Liters Oxygen. ) Continent Weight: 139 lb 15.9 oz (63.5 kg) Height:  _0  (160 cm)  BEHAVIORAL SYMPTOMS/MOOD NEUROLOGICAL BOWEL NUTRITION STATUS      Continent Diet(Diet: NPO for surgery to be advanced. )  AMBULATORY STATUS COMMUNICATION OF NEEDS Skin   Extensive Assist Verbally Surgical wounds                       Personal Care Assistance Level of Assistance  Bathing, Feeding, Dressing Bathing Assistance: Limited assistance Feeding assistance: Independent Dressing  Assistance: Limited assistance     Functional Limitations Info  Sight, Hearing, Speech Sight Info: Adequate Hearing Info: Adequate Speech Info: Adequate    SPECIAL CARE FACTORS FREQUENCY  PT (By licensed PT), OT (By licensed OT)     PT Frequency: (5) OT Frequency: (5)            Contractures      Additional Factors Info  Code Status, Allergies Code Status Info: (Full Code. ) Allergies Info: (Imuran Azathioprine)           Current Medications (03/10/2018):  This is the current hospital active medication list Current Facility-Administered Medications  Medication Dose Route Frequency Provider Last Rate Last Dose  . acetaminophen (TYLENOL) tablet 650 mg  650 mg Oral Q6H PRN Lance Coon, MD       Or  . acetaminophen (TYLENOL) suppository 650 mg  650 mg Rectal Q6H PRN Lance Coon, MD      . ceFAZolin (ANCEF) IVPB 2g/100 mL premix  2 g Intravenous 30 min Pre-Op Lovell Sheehan, MD      . clonazePAM [facility]) tablet 0.5 mg  0.5 mg Oral BID PRN Lance Coon, MD      . hydroxychloroquine (PLAQUENIL) tablet 400 mg  400 mg Oral Daily Lance Coon, MD      . morphine 4 MG/ML injection 4 mg  4 mg Intravenous Q4H PRN Lance Coon, MD      . mycophenolate (CELLCEPT) capsule 1,000 mg  1,000 mg Oral BID Lance Coon, MD      . ondansetron [facility]) tablet 4  mg  4 mg Oral Q6H PRN Lance Coon, MD       Or  . ondansetron [facility]) injection 4 mg  4 mg Intravenous Q6H PRN Lance Coon, MD      . oxyCODONE (Oxy IR/ROXICODONE) immediate release tablet 5 mg  5 mg Oral Q4H PRN Lance Coon, MD   5 mg at 03/10/18 4166  . pantoprazole (PROTONIX) EC tablet 40 mg  40 mg Oral Daily Lance Coon, MD   40 mg at 03/10/18 0630  . predniSONE (DELTASONE) tablet 10 mg  10 mg Oral Daily Lance Coon, MD      . sertraline (ZOLOFT) tablet 50 mg  50 mg Oral Daily Lance Coon, MD      . zolpidem [facility]) tablet 5 mg  5 mg Oral QHS PRN Lance Coon, MD         Discharge Medications: Please  see discharge summary for a list of discharge medications.  Relevant Imaging Results:  Relevant Lab Results:   Additional Information (SSN: [id number])  Jden Want, Veronia Beets, LCSW

## 2018-03-10 NOTE — Transfer of Care (Signed)
Immediate Anesthesia Transfer of Care Note  Patient: Melanie Rollins  Procedure(s) Performed: INTRAMEDULLARY (IM) NAIL INTERTROCHANTRIC-LEFT TFNA (Left Hip)  Patient Location: PACU  Anesthesia Type:Spinal  Level of Consciousness: awake, alert  and oriented  Airway & Oxygen Therapy: Patient Spontanous Breathing and Patient connected to face mask oxygen  Post-op Assessment: Report given to RN and Post -op Vital signs reviewed and stable  Post vital signs: Reviewed and stable  Last Vitals:  Vitals Value Taken Time  BP 100/39 03/10/2018  3:58 PM  Temp 36.6 C 03/10/2018  3:58 PM  Pulse 57 03/10/2018  4:05 PM  Resp 18 03/10/2018  4:05 PM  SpO2 95 % 03/10/2018  4:05 PM  Vitals shown include unvalidated device data.  Last Pain:  Vitals:   03/10/18 1321  TempSrc:   PainSc: 6       Patients Stated Pain Goal: 3 (53/39/17 9217)  Complications: No apparent anesthesia complications

## 2018-03-11 ENCOUNTER — Encounter: Payer: Self-pay | Admitting: Orthopedic Surgery

## 2018-03-11 LAB — CBC
HEMATOCRIT: 27.8 % — AB (ref 36.0–46.0)
HEMOGLOBIN: 8.4 g/dL — AB (ref 12.0–15.0)
MCH: 29 pg (ref 26.0–34.0)
MCHC: 30.2 g/dL (ref 30.0–36.0)
MCV: 95.9 fL (ref 80.0–100.0)
NRBC: 0 % (ref 0.0–0.2)
Platelets: 209 10*3/uL (ref 150–400)
RBC: 2.9 MIL/uL — AB (ref 3.87–5.11)
RDW: 12.8 % (ref 11.5–15.5)
WBC: 9.5 10*3/uL (ref 4.0–10.5)

## 2018-03-11 LAB — BASIC METABOLIC PANEL
Anion gap: 4 — ABNORMAL LOW (ref 5–15)
BUN: 8 mg/dL (ref 8–23)
CALCIUM: 8 mg/dL — AB (ref 8.9–10.3)
CHLORIDE: 102 mmol/L (ref 98–111)
CO2: 30 mmol/L (ref 22–32)
CREATININE: 0.72 mg/dL (ref 0.44–1.00)
GFR calc non Af Amer: >60 mL/min (ref >60)
Glucose, Bld: 110 mg/dL — ABNORMAL HIGH (ref 70–99)
Potassium: 4.2 mmol/L (ref 3.5–5.1)
SODIUM: 136 mmol/L (ref 135–145)

## 2018-03-11 MED ORDER — SODIUM CHLORIDE 0.9 % IV SOLN
INTRAVENOUS | Status: DC | PRN
  Administered 2018-03-11: 500 mL via INTRAVENOUS

## 2018-03-11 MED ORDER — MIDODRINE HCL 5 MG PO TABS
5.0000 mg | ORAL_TABLET | Freq: Three times a day (TID) | ORAL | Status: DC
  Administered 2018-03-11 – 2018-03-14 (×10): 5 mg via ORAL
  Filled 2018-03-11 (×10): qty 1

## 2018-03-11 MED ORDER — METHOCARBAMOL 500 MG PO TABS
500.0000 mg | ORAL_TABLET | Freq: Four times a day (QID) | ORAL | Status: DC | PRN
  Administered 2018-03-11 – 2018-03-12 (×4): 500 mg via ORAL
  Filled 2018-03-11 (×4): qty 1

## 2018-03-11 MED ORDER — SODIUM CHLORIDE 0.9 % IV SOLN
INTRAVENOUS | Status: DC
  Administered 2018-03-11: 21:00:00 via INTRAVENOUS

## 2018-03-11 MED ORDER — ENOXAPARIN SODIUM 40 MG/0.4ML ~~LOC~~ SOLN
40.0000 mg | SUBCUTANEOUS | Status: DC
  Administered 2018-03-11 – 2018-03-12 (×2): 40 mg via SUBCUTANEOUS
  Filled 2018-03-11 (×3): qty 0.4

## 2018-03-11 NOTE — Anesthesia Postprocedure Evaluation (Signed)
Anesthesia Post Note  Patient: Melanie Rollins  Procedure(s) Performed: INTRAMEDULLARY (IM) NAIL INTERTROCHANTRIC-LEFT TFNA (Left Hip)  Patient location during evaluation: Nursing Unit Anesthesia Type: Spinal Level of consciousness: awake and awake and alert Pain management: pain level controlled Vital Signs Assessment: post-procedure vital signs reviewed and stable Respiratory status: spontaneous breathing, nonlabored ventilation and respiratory function stable Cardiovascular status: blood pressure returned to baseline and stable Postop Assessment: no headache and no backache Anesthetic complications: no     Last Vitals:  Vitals:   03/11/18 0606 03/11/18 0845  BP: (!) 113/45 (!) 99/45  Pulse: 68 67  Resp:  18  Temp:  36.8 C  SpO2:  (!) 86%    Last Pain:  Vitals:   03/11/18 0855  TempSrc:   PainSc: 3                  [employer]

## 2018-03-11 NOTE — Progress Notes (Signed)
Subjective:  Patient reports pain as moderate.  Some muscle spasms.  Objective:   VITALS:   Vitals:   03/10/18 2305 03/11/18 0207 03/11/18 0427 03/11/18 0606  BP: (!) 93/48 (!) 98/54 (!) 89/55 (!) 113/45  Pulse: 70  66 68  Resp: 16  16   Temp: 98.6 F (37 C)  98.9 F (37.2 C)   TempSrc: Oral     SpO2: 95%  100%   Weight:      Height:        PHYSICAL EXAM:  Sensation intact distally Intact pulses distally Dorsiflexion/Plantar flexion intact Incision: dressing C/D/I No cellulitis present Compartment soft  LABS  Results for orders placed or performed during the hospital encounter of 03/09/18 (from the past 24 hour(s))  Basic metabolic panel     Status: Abnormal   Collection Time: 03/11/18  3:46 AM  Result Value Ref Range   Sodium 136 135 - 145 mmol/L   Potassium 4.2 3.5 - 5.1 mmol/L   Chloride 102 98 - 111 mmol/L   CO2 30 22 - 32 mmol/L   Glucose, Bld 110 (H) 70 - 99 mg/dL   BUN 8 8 - 23 mg/dL   Creatinine, Ser 0.72 0.44 - 1.00 mg/dL   Calcium 8.0 (L) 8.9 - 10.3 mg/dL   GFR calc non Af Amer >60 >60 mL/min   GFR calc Af Amer >60 >60 mL/min   Anion gap 4 (L) 5 - 15    Dg Chest 1 View  Result Date: 03/09/2018 CLINICAL DATA:  Left hip fracture.  History of pulmonary fibrosis. EXAM: CHEST  1 VIEW COMPARISON:  Chest CT 05/08/2015. FINDINGS: Peripheral reticulation, left greater than right with slight basilar predominant gradient, similar to prior CT. Chronic blunting of left costophrenic angle with surgical sutures. No superimposed airspace disease. Unchanged heart size and mediastinal contours, borderline cardiomegaly. No pneumothorax. No visualized rib fractures. Scoliotic curvature of the spine. IMPRESSION: Interstitial lung disease, grossly unchanged from 2016 CT allowing for differences in modality. No acute superimposed abnormality. Electronically Signed   By: Keith Rake M.D.   On: 03/09/2018 23:51   Dg Hip Operative Unilat W Or W/o Pelvis Left  Result  Date: 03/10/2018 CLINICAL DATA:  Left hip fracture EXAM: OPERATIVE left HIP (WITH PELVIS IF PERFORMED) 4 VIEWS TECHNIQUE: Fluoroscopic spot image(s) were submitted for interpretation post-operatively. COMPARISON:  03/09/2018 FINDINGS: Four low resolution intraoperative spot views of the left hip. Total fluoroscopy time was 1 minutes 36 seconds. Images demonstrate intramedullary rod and screw fixation of the left femur for proximal femoral fracture. IMPRESSION: Intraoperative fluoroscopic assistance provided during surgical fixation of left femur fracture. Electronically Signed   By: Donavan Foil M.D.   On: 03/10/2018 16:42   Dg Hip Unilat With Pelvis 2-3 Views Left  Result Date: 03/09/2018 CLINICAL DATA:  Left hip pain after fall out of bed. EXAM: DG HIP (WITH OR WITHOUT PELVIS) 2-3V LEFT COMPARISON:  None. FINDINGS: Comminuted displaced left proximal femur fracture. Fracture involves the proximal shaft, subtrochanteric and lesser trochanteric regions. There is osseous distraction of 4 cm about the femoral shaft component. Displacement of 7 mm about the lesser trochanteric component. Apex lateral angulation. Femoral head remains seated. Pubic rami are intact. Pubic symphysis and sacroiliac joints are congruent. IMPRESSION: Comminuted displaced left proximal femur fracture involving the proximal femoral shaft, subtrochanteric and lesser trochanteric portions. Significant displacement with mild apex lateral angulation. Electronically Signed   By: Keith Rake M.D.   On: 03/09/2018 23:47    Assessment/Plan:  1 Day Post-Op   Principal Problem:   Closed left hip fracture (HCC) Active Problems:   Pulmonary fibrosis (HCC)   Anxiety and depression   Hyperlipidemia   Advance diet Up with therapy Discharge to SNF when medically stable DVT prophylaxis   Lovell Sheehan , MD 03/11/2018, 8:36 AM

## 2018-03-11 NOTE — Progress Notes (Signed)
[place] at [facility]                                                                                                                                                                                  Patient Demographics   Melanie Rollins, is a 72 y.o. female, DOB - 01-Feb-1946, ELM:[id number]  Admit date - 03/09/2018   Admitting Physician Lance Coon, MD  Outpatient Primary MD for the patient is Park Liter P, DO   LOS - 1  Subjective: Patient feeling little weak felt dizzy earlier blood pressures running little low continues to have a pain    Review of Systems:   CONSTITUTIONAL: No documented fever. No fatigue, weakness. No weight gain, no weight loss.  EYES: No blurry or double vision.  ENT: No tinnitus. No postnasal drip. No redness of the oropharynx.  RESPIRATORY: No cough, no wheeze, no hemoptysis. No dyspnea.  CARDIOVASCULAR: No chest pain. No orthopnea. No palpitations. No syncope.  GASTROINTESTINAL: No nausea, no vomiting or diarrhea. No abdominal pain. No melena or hematochezia.  GENITOURINARY: No dysuria or hematuria.  ENDOCRINE: No polyuria or nocturia. No heat or cold intolerance.  HEMATOLOGY: No anemia. No bruising. No bleeding.  INTEGUMENTARY: No rashes. No lesions.  MUSCULOSKELETAL: No arthritis. No swelling. No gout.  Positive hip pain  nEUROLOGIC: No numbness, tingling, or ataxia. No seizure-type activity.  PSYCHIATRIC: No anxiety. No insomnia. No ADD.    Vitals:   Vitals:   03/11/18 0427 03/11/18 0606 03/11/18 0845 03/11/18 1015  BP: (!) 89/55 (!) 113/45 (!) 99/45 (!) 92/49  Pulse: 66 68 67 84  Resp: 16  18   Temp: 98.9 F (37.2 C)  98.3 F (36.8 C)   TempSrc:   Oral   SpO2: 100%  (!) 86% 98%  Weight:      Height:        Wt Readings from Last 3 Encounters:  03/10/18 63.5 kg  01/13/18 65.1 kg  12/13/17 64.8 kg     Intake/Output Summary (Last 24 hours) at 03/11/2018 1604 Last data filed at 03/11/2018 1503 Gross  per 24 hour  Intake 2861.37 ml  Output 1250 ml  Net 1611.37 ml    Physical Exam:   GENERAL: Pleasant-appearing in no apparent distress.  HEAD, EYES, EARS, NOSE AND THROAT: Atraumatic, normocephalic. Extraocular muscles are intact. Pupils equal and reactive to light. Sclerae anicteric. No conjunctival injection. No oro-pharyngeal erythema.  NECK: Supple. There is no jugular venous distention. No bruits, no lymphadenopathy, no thyromegaly.  HEART: Regular rate and rhythm,. No murmurs, no rubs, no clicks.  LUNGS: Clear to auscultation bilaterally. No rales or rhonchi. No wheezes.  ABDOMEN: Soft, flat,  nontender, nondistended. Has good bowel sounds. No hepatosplenomegaly appreciated.  EXTREMITIES: No evidence of any cyanosis, clubbing, or peripheral edema.  +2 pedal and radial pulses bilaterally.  NEUROLOGIC: The patient is alert, awake, and oriented x3 with no focal motor or sensory deficits appreciated bilaterally.  SKIN: Moist and warm with no rashes appreciated.  Psych: Not anxious, depressed LN: No inguinal LN enlargement    Antibiotics   Anti-infectives (From admission, onward)   Start     Dose/Rate Route Frequency Ordered Stop   03/10/18 1438  50,000 units bacitracin in 0.9% normal saline 250 mL irrigation  Status:  Discontinued       As needed 03/10/18 1448 03/10/18 1554   03/10/18 1253  ceFAZolin (ANCEF) 2-4 GM/100ML-% IVPB    Note to Pharmacy:  Milinda Cave   : cabinet override      03/10/18 1253 03/10/18 1448   03/10/18 1000  hydroxychloroquine (PLAQUENIL) tablet 400 mg     400 mg Oral Daily 03/10/18 0115     03/10/18 0705  ceFAZolin (ANCEF) IVPB 2g/100 mL premix     2 g 200 mL/hr over 30 Minutes Intravenous 30 min pre-op 03/10/18 0705 03/10/18 1432      Medications   Scheduled Meds: . docusate sodium  100 mg Oral BID  . enoxaparin (LOVENOX) injection  40 mg Subcutaneous Q24H  . hydroxychloroquine  400 mg Oral Daily  . midodrine  5 mg Oral TID WC  .  mycophenolate  1,000 mg Oral BID  . pantoprazole  40 mg Oral Daily  . predniSONE  10 mg Oral Daily  . sertraline  50 mg Oral Daily   Continuous Infusions: . sodium chloride 75 mL/hr at 03/11/18 1502  . sodium chloride 75 mL/hr at 03/11/18 1459  . lactated ringers Stopped (03/11/18 0831)   PRN Meds:.sodium chloride, acetaminophen **OR** acetaminophen, clonazePAM, methocarbamol, metoCLOPramide **OR** metoCLOPramide (REGLAN) injection, morphine injection, ondansetron **OR** ondansetron (ZOFRAN) IV, oxyCODONE, zolpidem   Data Review:   Micro Results Recent Results (from the past 240 hour(s))  MRSA PCR Screening     Status: None   Collection Time: 03/10/18  1:44 AM  Result Value Ref Range Status   MRSA by PCR NEGATIVE NEGATIVE Final    Comment:        The GeneXpert MRSA Assay (FDA approved for NASAL specimens only), is one component of a comprehensive MRSA colonization surveillance program. It is not intended to diagnose MRSA infection nor to guide or monitor treatment for MRSA infections. Performed at [facility], [address] [place], [place] [address]     Radiology Reports Dg Chest 1 View  Result Date: 03/09/2018 CLINICAL DATA:  Left hip fracture.  History of pulmonary fibrosis. EXAM: CHEST  1 VIEW COMPARISON:  Chest CT 05/08/2015. FINDINGS: Peripheral reticulation, left greater than right with slight basilar predominant gradient, similar to prior CT. Chronic blunting of left costophrenic angle with surgical sutures. No superimposed airspace disease. Unchanged heart size and mediastinal contours, borderline cardiomegaly. No pneumothorax. No visualized rib fractures. Scoliotic curvature of the spine. IMPRESSION: Interstitial lung disease, grossly unchanged from 2016 CT allowing for differences in modality. No acute superimposed abnormality. Electronically Signed   By: Keith Rake M.D.   On: 03/09/2018 23:51   Dg Hip Operative Unilat W Or W/o Pelvis  Left  Result Date: 03/10/2018 CLINICAL DATA:  Left hip fracture EXAM: OPERATIVE left HIP (WITH PELVIS IF PERFORMED) 4 VIEWS TECHNIQUE: Fluoroscopic spot image(s) were submitted for interpretation post-operatively. COMPARISON:  03/09/2018 FINDINGS:  Four low resolution intraoperative spot views of the left hip. Total fluoroscopy time was 1 minutes 36 seconds. Images demonstrate intramedullary rod and screw fixation of the left femur for proximal femoral fracture. IMPRESSION: Intraoperative fluoroscopic assistance provided during surgical fixation of left femur fracture. Electronically Signed   By: Donavan Foil M.D.   On: 03/10/2018 16:42   Dg Hip Unilat With Pelvis 2-3 Views Left  Result Date: 03/09/2018 CLINICAL DATA:  Left hip pain after fall out of bed. EXAM: DG HIP (WITH OR WITHOUT PELVIS) 2-3V LEFT COMPARISON:  None. FINDINGS: Comminuted displaced left proximal femur fracture. Fracture involves the proximal shaft, subtrochanteric and lesser trochanteric regions. There is osseous distraction of 4 cm about the femoral shaft component. Displacement of 7 mm about the lesser trochanteric component. Apex lateral angulation. Femoral head remains seated. Pubic rami are intact. Pubic symphysis and sacroiliac joints are congruent. IMPRESSION: Comminuted displaced left proximal femur fracture involving the proximal femoral shaft, subtrochanteric and lesser trochanteric portions. Significant displacement with mild apex lateral angulation. Electronically Signed   By: Keith Rake M.D.   On: 03/09/2018 23:47     CBC Recent Labs  Lab 03/09/18 2301 03/10/18 0241 03/11/18 0346  WBC 7.7 15.2* 9.5  HGB 10.6* 10.3* 8.4*  HCT 33.4* 31.8* 27.8*  PLT 235 234 209  MCV 92.3 91.6 95.9  MCH 29.3 29.7 29.0  MCHC 31.7 32.4 30.2  RDW 12.7 12.7 12.8  LYMPHSABS 1.5  --   --   MONOABS 0.7  --   --   EOSABS 0.1  --   --   BASOSABS 0.0  --   --     Chemistries  Recent Labs  Lab 03/09/18 2301 03/10/18 0241  03/11/18 0346  NA 134* 134* 136  K 3.9 3.0* 4.2  CL 102 101 102  CO2 _0 GLUCOSE 114* 111* 110*  BUN _1 CREATININE 0.68 0.61 0.72  CALCIUM 8.2* 8.0* 8.0*   ------------------------------------------------------------------------------------------------------------------ estimated creatinine clearance is 57.8 mL/min (by C-G formula based on SCr of 0.72 mg/dL). ------------------------------------------------------------------------------------------------------------------ No results for input(s): HGBA1C in the last 72 hours. ------------------------------------------------------------------------------------------------------------------ No results for input(s): CHOL, HDL, LDLCALC, TRIG, CHOLHDL, LDLDIRECT in the last 72 hours. ------------------------------------------------------------------------------------------------------------------ No results for input(s): TSH, T4TOTAL, T3FREE, THYROIDAB in the last 72 hours.  Invalid input(s): FREET3 ------------------------------------------------------------------------------------------------------------------ No results for input(s): VITAMINB12, FOLATE, FERRITIN, TIBC, IRON, RETICCTPCT in the last 72 hours.  Coagulation profile Recent Labs  Lab 03/10/18 0241  INR 0.96    No results for input(s): DDIMER in the last 72 hours.  Cardiac Enzymes No results for input(s): CKMB, TROPONINI, MYOGLOBIN in the last 168 hours.  Invalid input(s): CK ------------------------------------------------------------------------------------------------------------------ Invalid input(s): [place]  Patient is 72 year old admitted with hip fracture  1.  Hip fracture status post repair continue PT  2.  Hypotension due to combination of medications and anemia I will place patient back on IV fluids started on midodrine  3.  Acute blood loss anemia repeat CBC in the morning  4, pulmonary fibrosis stable on oxygen as  tolerated  5.  Depression continue sertraline  6.  Miscellaneous Lovenox for DVT prophylaxis     Code Status Orders  (From admission, onward)         Start     Ordered   03/10/18 0116  Full code  Continuous     03/10/18 0115        Code Status History    This  patient has a current code status but no historical code status.    Advance Directive Documentation     Most Recent Value  Type of Advance Directive  Living will, Healthcare Power of Attorney  Pre-existing out of facility DNR order (yellow form or pink MOST form)  -  "MOST" Form in Place?  -           Consultsortho DVT Prophylaxis  Lovenox   Lab Results  Component Value Date   PLT 209 03/11/2018     Time Spent in minutes   69mn Greater than 50% of time spent in care coordination and counseling patient regarding the condition and plan of care.   SDustin FlockM.D on 03/11/2018 at 4:04 PM  Between 7am to 6pm - Pager - [phone]  After 6pm go to [email or web address] - [employer]   Office  [phone]

## 2018-03-11 NOTE — Progress Notes (Signed)
Physical Therapy Treatment Patient Details Name: Melanie Rollins MRN: [id number] DOB: October 29, 1945 Today's Date: 03/11/2018    History of Present Illness  Pt is a 72 y.o. female who complained of left hip pain after a fall.  Pt diagnosed with a L hip fracture and is now s/p L intertrochanteric IM nail.  PMH includes: arthritis, pulmonary fibrosis, Raynaud's syndrome, anxiety/depression, and HLD.     PT Comments    Pt presents with deficits in strength, transfers, mobility, gait, balance, and activity tolerance but made significant progress this session.  Pt continues to require extensive assist with bed mobility tasks but improved effort this session.  Pt was able to stand for a longer duration this session, around 3 min max, and was able to take several very small side steps to the right minimally clearing the floor with both feet.  Pt relied on heavy use of the RW while advancing her RLE secondary to L hip pain with weight bearing.  No N&V this session.  Pt does remain very limited by pain overall, however, and time was spent on education for relaxation breathing techniques.  Pt will benefit from PT services in a SNF setting upon discharge to safely address above deficits for decreased caregiver assistance and eventual return to PLOF.        Follow Up Recommendations  SNF     Equipment Recommendations  Other (comment)(TBD at next venue of care)    Recommendations for Other Services       Precautions / Restrictions Precautions Precautions: Fall Restrictions Weight Bearing Restrictions: Yes LLE Weight Bearing: Weight bearing as tolerated    Mobility  Bed Mobility Overal bed mobility: Needs Assistance Bed Mobility: Supine to Sit;Sit to Supine     Supine to sit: Max assist Sit to supine: Max assist   General bed mobility comments: Mod verbal cues for sequencing with extra time required secondary to L hip pain with movement  Transfers Overall transfer level: Needs  assistance Equipment used: Rolling walker (2 wheeled) Transfers: Sit to/from Stand Sit to Stand: From elevated surface;Min assist         General transfer comment: Encouragement required to attempt to stand with mod verbal cues for sequencing   Ambulation/Gait   Gait Distance (Feet): 1 Feet Assistive device: Rolling walker (2 wheeled) Gait Pattern/deviations: Step-to pattern;Antalgic;Decreased stance time - left Gait velocity: Decreased   General Gait Details: Pt able to take several very small side steps to the right at the EOB minimally clearing the floor with both LEs with mod verbal cues for sequencing with the RW   Stairs             Wheelchair Mobility    Modified Rankin (Stroke Patients Only)       Balance Overall balance assessment: Needs assistance Sitting-balance support: Feet supported Sitting balance-Leahy Scale: Good Sitting balance - Comments: Pt leaned to the R to avoid weight bearing on the left hip in sitting Postural control: Right lateral lean Standing balance support: Bilateral upper extremity supported Standing balance-Leahy Scale: Fair Standing balance comment: No LOB in standing but heavy reliance on BUEs for support                            Cognition Arousal/Alertness: Awake/alert Behavior During Therapy: WFL for tasks assessed/performed Overall Cognitive Status: Within Functional Limits for tasks assessed  Exercises Total Joint Exercises Ankle Circles/Pumps: AROM;Both;10 reps Quad Sets: Strengthening;Both;10 reps Gluteal Sets: Strengthening;Both;10 reps Long Arc Quad: AROM;Both;10 reps Knee Flexion: AROM;Both;10 reps Other Exercises Other Exercises: HEP education and review for BLE APs, QS, and GS x 10 each 5-6x/day as tolerated Other Exercises: Log roll technique training during sup to/from sit    General Comments        Pertinent Vitals/Pain Pain  Assessment: 0-10 Pain Score: 3  Pain Location: L hip Pain Descriptors / Indicators: Sore;Aching Pain Intervention(s): Limited activity within patient's tolerance;Monitored during session;Premedicated before session    Home Living                      Prior Function            PT Goals (current goals can now be found in the care plan section) Progress towards PT goals: Progressing toward goals    Frequency    BID      PT Plan Current plan remains appropriate    Co-evaluation              AM-PAC PT "6 Clicks" Daily Activity  Outcome Measure                   End of Session Equipment Utilized During Treatment: Gait belt;Oxygen Activity Tolerance: Patient limited by pain Patient left: in bed;with call bell/phone within reach;with bed alarm set;with SCD's reapplied;with family/visitor present Nurse Communication: Mobility status PT Visit Diagnosis: Muscle weakness (generalized) (M62.81);Other abnormalities of gait and mobility (R26.89);Pain Pain - Right/Left: Left Pain - part of body: Hip     Time: 4496-7591 PT Time Calculation (min) (ACUTE ONLY): 27 min  Charges:  $Therapeutic Exercise: 8-22 mins $Therapeutic Activity: 8-22 mins                     D. Scott Jamorion Gomillion PT, DPT 03/11/18, 4:59 PM

## 2018-03-11 NOTE — Progress Notes (Signed)
PT is recommending SNF. Clinical Education officer, museum (CSW) met with patient alone at bedside and provided bed offers. Patient chose [facility] and is okay with a semi-private room. Plan is for patient to D/C to [facility] Thursday 03/13/18 pending medical clearance. [facility] admissions coordinator at [facility] is aware of above. Medicare bundle case manager is aware of above.  [employer], LCSW [phone]

## 2018-03-11 NOTE — Evaluation (Signed)
Physical Therapy Evaluation Patient Details Name: Melanie Rollins MRN: [id number] DOB: 1945-08-05 Today's Date: 03/11/2018   History of Present Illness   Pt is a 72 y.o. female who complained of left hip pain after a fall.  Pt diagnosed with a L hip fracture and is now s/p L intertrochanteric IM nail.  PMH includes: arthritis, pulmonary fibrosis, Raynaud's syndrome, anxiety/depression, and HLD.     Clinical Impression  Pt presents with deficits in strength, transfers, mobility, gait, balance, and activity tolerance.  Pt limited during the session by 10/10 L hip pain with movement and weight bearing.  Pt required max A with bed mobility tasks with log roll technique training provided with pillow between the knees for L hip support support.  Pt training provided in sit to/from stand transfers from an elevated surface with pt able to stand on multiple occasions x 30-40 sec max.  Pt did participate in gentle left/right weight shifting in standing but was unable to advance or lift either foot from the floor.  Pt would not be safe to return to her prior living situation secondary to being profoundly limited with all functional mobility tasks combined with limited support available at home.  Pt will benefit from PT services in a SNF setting upon discharge to safely address above deficits for decreased caregiver assistance and eventual return to PLOF.        Follow Up Recommendations SNF    Equipment Recommendations  Rolling walker with 5" wheels;Other (comment)(TBD at next venue of care upon discharge to a SNF)    Recommendations for Other Services       Precautions / Restrictions Precautions Precautions: Fall Restrictions Weight Bearing Restrictions: Yes LLE Weight Bearing: Weight bearing as tolerated      Mobility  Bed Mobility Overal bed mobility: Needs Assistance Bed Mobility: Supine to Sit;Sit to Supine     Supine to sit: Max assist Sit to supine: Max assist   General bed  mobility comments: Mod verbal cues for sequencing with extra time required secondary to L hip pain with movement  Transfers Overall transfer level: Needs assistance Equipment used: Rolling walker (2 wheeled) Transfers: Sit to/from Stand Sit to Stand: Mod assist;From elevated surface         General transfer comment: Encouragement required to attempt to stand with mod verbal cues for sequencing   Ambulation/Gait             General Gait Details: Pt unable to advance either LE in standing secondary to pain  Stairs            Wheelchair Mobility    Modified Rankin (Stroke Patients Only)       Balance Overall balance assessment: Needs assistance Sitting-balance support: Feet supported Sitting balance-Leahy Scale: Good Sitting balance - Comments: Pt leaned to the R to avoid weight bearing on the left hip in sitting Postural control: Right lateral lean Standing balance support: Bilateral upper extremity supported Standing balance-Leahy Scale: Fair Standing balance comment: No LOB in standing but heavy reliance on BUEs for support                             Pertinent Vitals/Pain Pain Assessment: 0-10 Pain Score: 2  Pain Location: 2/10 low back and L hip at rest, 10/10 L hip pain with movement and weight bearing Pain Descriptors / Indicators: Sore Pain Intervention(s): Premedicated before session;Monitored during session;Limited activity within patient's tolerance    Home Living Family/patient  expects to be discharged to:: Private residence Living Arrangements: Spouse/significant other Available Help at Discharge: Family;Available PRN/intermittently Type of Home: House Home Access: Stairs to enter Entrance Stairs-Rails: Right Entrance Stairs-Number of Steps: 3 steps with no rails or 6 steps with R rail Home Layout: Two level;Able to live on main level with bedroom/bathroom Home Equipment: Walker - 4 wheels      Prior Function Level of  Independence: Independent         Comments: Ind amb community distances without AD, no other fall history, Ind with ADLs/IADLs     Hand Dominance        Extremity/Trunk Assessment   Upper Extremity Assessment Upper Extremity Assessment: Overall WFL for tasks assessed    Lower Extremity Assessment Lower Extremity Assessment: Generalized weakness;LLE deficits/detail LLE: Unable to fully assess due to pain       Communication   Communication: No difficulties  Cognition Arousal/Alertness: Awake/alert Behavior During Therapy: WFL for tasks assessed/performed Overall Cognitive Status: Within Functional Limits for tasks assessed                                        General Comments      Exercises Total Joint Exercises Ankle Circles/Pumps: AROM;Both;5 reps;10 reps Quad Sets: Strengthening;Both;5 reps;10 reps Gluteal Sets: Strengthening;Both;5 reps;10 reps Towel Squeeze: Strengthening;Both;5 reps Heel Slides: AAROM;AROM;Both;5 reps(AAROM on the LLE with limited amplitude) Hip ABduction/ADduction: AAROM;Right;10 reps Straight Leg Raises: AAROM;Right;10 reps Other Exercises Other Exercises: HEP education and review for BLE APs, QS, and GS x 10 each 5-6x/day as tolerated   Assessment/Plan    PT Assessment Patient needs continued PT services  PT Problem List Decreased strength;Decreased activity tolerance;Decreased balance;Decreased knowledge of use of DME;Decreased mobility;Pain       PT Treatment Interventions DME instruction;Gait training;Stair training;Functional mobility training;Balance training;Therapeutic exercise;Therapeutic activities;Patient/family education    PT Goals (Current goals can be found in the Care Plan section)  Acute Rehab PT Goals Patient Stated Goal: To walk again PT Goal Formulation: With patient Time For Goal Achievement: 03/24/18 Potential to Achieve Goals: Good    Frequency BID   Barriers to discharge Inaccessible  home environment;Decreased caregiver support      Co-evaluation               AM-PAC PT "6 Clicks" Daily Activity  Outcome Measure Difficulty turning over in bed (including adjusting bedclothes, sheets and blankets)?: Unable Difficulty moving from lying on back to sitting on the side of the bed? : Unable Difficulty sitting down on and standing up from a chair with arms (e.g., wheelchair, bedside commode, etc,.)?: Unable Help needed moving to and from a bed to chair (including a wheelchair)?: A Lot Help needed walking in hospital room?: Total Help needed climbing 3-5 steps with a railing? : Total 6 Click Score: 7    End of Session Equipment Utilized During Treatment: Gait belt;Oxygen Activity Tolerance: Patient limited by pain Patient left: in bed;with call bell/phone within reach;with bed alarm set;with SCD's reapplied Nurse Communication: Mobility status;Other (comment)(Pt with N&V during session) PT Visit Diagnosis: Muscle weakness (generalized) (M62.81);Other abnormalities of gait and mobility (R26.89);Pain Pain - Right/Left: Left Pain - part of body: Hip    Time: 9144-4584 PT Time Calculation (min) (ACUTE ONLY): 44 min   Charges:   PT Evaluation $PT Eval Low Complexity: 1 Low PT Treatments $Therapeutic Exercise: 8-22 mins $Therapeutic Activity: 8-22 mins  Linus Salmons PT, DPT 03/11/18, 12:09 PM

## 2018-03-12 ENCOUNTER — Encounter
Admission: RE | Admit: 2018-03-12 | Discharge: 2018-03-12 | Disposition: A | Discharge status: Home / Self Care | Payer: Medicare Other | Source: Ambulatory Visit | Attending: Internal Medicine | Admitting: Internal Medicine

## 2018-03-12 LAB — URINALYSIS, ROUTINE W REFLEX MICROSCOPIC
Bilirubin Urine: NEGATIVE
Glucose, UA: NEGATIVE mg/dL
Ketones, ur: NEGATIVE mg/dL
NITRITE: NEGATIVE
Protein, ur: NEGATIVE mg/dL
SPECIFIC GRAVITY, URINE: 1.01 (ref 1.005–1.030)
pH: 7 (ref 5.0–8.0)

## 2018-03-12 LAB — PREPARE RBC (CROSSMATCH)

## 2018-03-12 LAB — CBC
HCT: 22.4 % — ABNORMAL LOW (ref 36.0–46.0)
Hemoglobin: 6.9 g/dL — ABNORMAL LOW (ref 12.0–15.0)
MCH: 28.6 pg (ref 26.0–34.0)
MCHC: 30.8 g/dL (ref 30.0–36.0)
MCV: 92.9 fL (ref 80.0–100.0)
PLATELETS: 189 10*3/uL (ref 150–400)
RBC: 2.41 MIL/uL — AB (ref 3.87–5.11)
RDW: 12.7 % (ref 11.5–15.5)
WBC: 8.8 10*3/uL (ref 4.0–10.5)
nRBC: 0 % (ref 0.0–0.2)

## 2018-03-12 LAB — BASIC METABOLIC PANEL
ANION GAP: 4 — AB (ref 5–15)
BUN: 8 mg/dL (ref 8–23)
CALCIUM: 7.6 mg/dL — AB (ref 8.9–10.3)
CO2: 29 mmol/L (ref 22–32)
Chloride: 106 mmol/L (ref 98–111)
Creatinine, Ser: 0.69 mg/dL (ref 0.44–1.00)
GLUCOSE: 115 mg/dL — AB (ref 70–99)
Potassium: 3.8 mmol/L (ref 3.5–5.1)
SODIUM: 139 mmol/L (ref 135–145)

## 2018-03-12 LAB — URINALYSIS, MICROSCOPIC (REFLEX): SQUAMOUS EPITHELIAL / LPF: NONE SEEN (ref 0–5)

## 2018-03-12 LAB — ABO/RH: ABO/RH(D): A POS

## 2018-03-12 MED ORDER — SODIUM CHLORIDE 0.9% IV SOLUTION
Freq: Once | INTRAVENOUS | Status: AC
  Administered 2018-03-12: 12:00:00 via INTRAVENOUS

## 2018-03-12 MED ORDER — POLYETHYLENE GLYCOL 3350 17 G PO PACK
17.0000 g | PACK | Freq: Every day | ORAL | Status: DC
  Administered 2018-03-12 – 2018-03-14 (×3): 17 g via ORAL
  Filled 2018-03-12 (×3): qty 1

## 2018-03-12 NOTE — Progress Notes (Signed)
[facility] at [facility]                                                                                                                                                                                  Patient Demographics   Melanie Rollins, is a 72 y.o. female, DOB - [birth year], ALP:[id number]  Admit date - 03/09/2018   Admitting Physician Lance Coon, MD  Outpatient Primary MD for the patient is Park Liter P, DO   LOS - 2  Subjective: P patient hemoglobin noted to be low Continues to have pain and hip    Review of Systems:   CONSTITUTIONAL: No documented fever. No fatigue, weakness. No weight gain, no weight loss.  EYES: No blurry or double vision.  ENT: No tinnitus. No postnasal drip. No redness of the oropharynx.  RESPIRATORY: No cough, no wheeze, no hemoptysis. No dyspnea.  CARDIOVASCULAR: No chest pain. No orthopnea. No palpitations. No syncope.  GASTROINTESTINAL: No nausea, no vomiting or diarrhea. No abdominal pain. No melena or hematochezia.  GENITOURINARY: No dysuria or hematuria.  ENDOCRINE: No polyuria or nocturia. No heat or cold intolerance.  HEMATOLOGY: No anemia. No bruising. No bleeding.  INTEGUMENTARY: No rashes. No lesions.  MUSCULOSKELETAL: No arthritis. No swelling. No gout.  Positive hip pain  nEUROLOGIC: No numbness, tingling, or ataxia. No seizure-type activity.  PSYCHIATRIC: No anxiety. No insomnia. No ADD.    Vitals:   Vitals:   03/12/18 0748 03/12/18 1203 03/12/18 1232 03/12/18 1422  BP: (!) 108/51 (!) 121/43 109/66 (!) 111/41  Pulse: 75 97 76 63  Resp:  18 18   Temp: 98.4 F (36.9 C) 98 F (36.7 C) 98.1 F (36.7 C) 98.9 F (37.2 C)  TempSrc: Oral Oral Axillary Oral  SpO2: 100% 93% 97% 100%  Weight:      Height:        Wt Readings from Last 3 Encounters:  03/10/18 63.5 kg  01/13/18 65.1 kg  12/13/17 64.8 kg     Intake/Output Summary (Last 24 hours) at 03/12/2018 1439 Last data filed at 03/12/2018  1203 Gross per 24 hour  Intake 1993.88 ml  Output 1600 ml  Net 393.88 ml    Physical Exam:   GENERAL: Pleasant-appearing in no apparent distress.  HEAD, EYES, EARS, NOSE AND THROAT: Atraumatic, normocephalic. Extraocular muscles are intact. Pupils equal and reactive to light. Sclerae anicteric. No conjunctival injection. No oro-pharyngeal erythema.  NECK: Supple. There is no jugular venous distention. No bruits, no lymphadenopathy, no thyromegaly.  HEART: Regular rate and rhythm,. No murmurs, no rubs, no clicks.  LUNGS: Clear to auscultation bilaterally. No rales or rhonchi. No wheezes.  ABDOMEN: Soft, flat,  nontender, nondistended. Has good bowel sounds. No hepatosplenomegaly appreciated.  EXTREMITIES: No evidence of any cyanosis, clubbing, or peripheral edema.  +2 pedal and radial pulses bilaterally.  NEUROLOGIC: The patient is alert, awake, and oriented x3 with no focal motor or sensory deficits appreciated bilaterally.  SKIN: Moist and warm with no rashes appreciated.  Psych: Not anxious, depressed LN: No inguinal LN enlargement    Antibiotics   Anti-infectives (From admission, onward)   Start     Dose/Rate Route Frequency Ordered Stop   03/10/18 1438  50,000 units bacitracin in 0.9% normal saline 250 mL irrigation  Status:  Discontinued       As needed 03/10/18 1448 03/10/18 1554   03/10/18 1253  ceFAZolin (ANCEF) 2-4 GM/100ML-% IVPB    Note to Pharmacy:  Milinda Cave   : cabinet override      03/10/18 1253 03/10/18 1448   03/10/18 1000  hydroxychloroquine (PLAQUENIL) tablet 400 mg     400 mg Oral Daily 03/10/18 0115     03/10/18 0705  ceFAZolin (ANCEF) IVPB 2g/100 mL premix     2 g 200 mL/hr over 30 Minutes Intravenous 30 min pre-op 03/10/18 0705 03/10/18 1432      Medications   Scheduled Meds: . docusate sodium  100 mg Oral BID  . enoxaparin (LOVENOX) injection  40 mg Subcutaneous Q24H  . hydroxychloroquine  400 mg Oral Daily  . midodrine  5 mg Oral TID WC  .  mycophenolate  1,000 mg Oral BID  . pantoprazole  40 mg Oral Daily  . predniSONE  10 mg Oral Daily  . sertraline  50 mg Oral Daily   Continuous Infusions: . sodium chloride 75 mL/hr at 03/11/18 1502  . sodium chloride 75 mL/hr at 03/11/18 2115  . lactated ringers Stopped (03/11/18 0831)   PRN Meds:.sodium chloride, acetaminophen **OR** acetaminophen, clonazePAM, methocarbamol, metoCLOPramide **OR** metoCLOPramide (REGLAN) injection, morphine injection, ondansetron **OR** ondansetron (ZOFRAN) IV, oxyCODONE, zolpidem   Data Review:   Micro Results Recent Results (from the past 240 hour(s))  MRSA PCR Screening     Status: None   Collection Time: 03/10/18  1:44 AM  Result Value Ref Range Status   MRSA by PCR NEGATIVE NEGATIVE Final    Comment:        The GeneXpert MRSA Assay (FDA approved for NASAL specimens only), is one component of a comprehensive MRSA colonization surveillance program. It is not intended to diagnose MRSA infection nor to guide or monitor treatment for MRSA infections. Performed at [facility], [address] [place], [place] [address]     Radiology Reports Dg Chest 1 View  Result Date: 03/09/2018 CLINICAL DATA:  Left hip fracture.  History of pulmonary fibrosis. EXAM: CHEST  1 VIEW COMPARISON:  Chest CT 05/08/2015. FINDINGS: Peripheral reticulation, left greater than right with slight basilar predominant gradient, similar to prior CT. Chronic blunting of left costophrenic angle with surgical sutures. No superimposed airspace disease. Unchanged heart size and mediastinal contours, borderline cardiomegaly. No pneumothorax. No visualized rib fractures. Scoliotic curvature of the spine. IMPRESSION: Interstitial lung disease, grossly unchanged from 2016 CT allowing for differences in modality. No acute superimposed abnormality. Electronically Signed   By: Keith Rake M.D.   On: 03/09/2018 23:51   Dg Hip Operative Unilat W Or W/o Pelvis  Left  Result Date: 03/10/2018 CLINICAL DATA:  Left hip fracture EXAM: OPERATIVE left HIP (WITH PELVIS IF PERFORMED) 4 VIEWS TECHNIQUE: Fluoroscopic spot image(s) were submitted for interpretation post-operatively. COMPARISON:  03/09/2018 FINDINGS:  Four low resolution intraoperative spot views of the left hip. Total fluoroscopy time was 1 minutes 36 seconds. Images demonstrate intramedullary rod and screw fixation of the left femur for proximal femoral fracture. IMPRESSION: Intraoperative fluoroscopic assistance provided during surgical fixation of left femur fracture. Electronically Signed   By: Donavan Foil M.D.   On: 03/10/2018 16:42   Dg Hip Unilat With Pelvis 2-3 Views Left  Result Date: 03/09/2018 CLINICAL DATA:  Left hip pain after fall out of bed. EXAM: DG HIP (WITH OR WITHOUT PELVIS) 2-3V LEFT COMPARISON:  None. FINDINGS: Comminuted displaced left proximal femur fracture. Fracture involves the proximal shaft, subtrochanteric and lesser trochanteric regions. There is osseous distraction of 4 cm about the femoral shaft component. Displacement of 7 mm about the lesser trochanteric component. Apex lateral angulation. Femoral head remains seated. Pubic rami are intact. Pubic symphysis and sacroiliac joints are congruent. IMPRESSION: Comminuted displaced left proximal femur fracture involving the proximal femoral shaft, subtrochanteric and lesser trochanteric portions. Significant displacement with mild apex lateral angulation. Electronically Signed   By: Keith Rake M.D.   On: 03/09/2018 23:47     CBC Recent Labs  Lab 03/09/18 2301 03/10/18 0241 03/11/18 0346 03/12/18 0617  WBC 7.7 15.2* 9.5 8.8  HGB 10.6* 10.3* 8.4* 6.9*  HCT 33.4* 31.8* 27.8* 22.4*  PLT 235 234 209 189  MCV 92.3 91.6 95.9 92.9  MCH 29.3 29.7 29.0 28.6  MCHC 31.7 32.4 30.2 30.8  RDW 12.7 12.7 12.8 12.7  LYMPHSABS 1.5  --   --   --   MONOABS 0.7  --   --   --   EOSABS 0.1  --   --   --   BASOSABS 0.0  --   --    --     Chemistries  Recent Labs  Lab 03/09/18 2301 03/10/18 0241 03/11/18 0346 03/12/18 0617  NA 134* 134* 136 139  K 3.9 3.0* 4.2 3.8  CL 102 101 102 106  CO2 _0 GLUCOSE 114* 111* 110* 115*  BUN _1 CREATININE 0.68 0.61 0.72 0.69  CALCIUM 8.2* 8.0* 8.0* 7.6*   ------------------------------------------------------------------------------------------------------------------ estimated creatinine clearance is 57.8 mL/min (by C-G formula based on SCr of 0.69 mg/dL). ------------------------------------------------------------------------------------------------------------------ No results for input(s): HGBA1C in the last 72 hours. ------------------------------------------------------------------------------------------------------------------ No results for input(s): CHOL, HDL, LDLCALC, TRIG, CHOLHDL, LDLDIRECT in the last 72 hours. ------------------------------------------------------------------------------------------------------------------ No results for input(s): TSH, T4TOTAL, T3FREE, THYROIDAB in the last 72 hours.  Invalid input(s): FREET3 ------------------------------------------------------------------------------------------------------------------ No results for input(s): VITAMINB12, FOLATE, FERRITIN, TIBC, IRON, RETICCTPCT in the last 72 hours.  Coagulation profile Recent Labs  Lab 03/10/18 0241  INR 0.96    No results for input(s): DDIMER in the last 72 hours.  Cardiac Enzymes No results for input(s): CKMB, TROPONINI, MYOGLOBIN in the last 168 hours.  Invalid input(s): CK ------------------------------------------------------------------------------------------------------------------ Invalid input(s): [place]  Patient is 72 year old admitted with hip fracture  1.  Hip fracture status post repair continue PT  2.  Hypotension due to combination of medications and anemia continue with midrorine blood pressure  improved  3.  Acute blood loss anemia transfuse 1 unit of packed RBCs repeat CBC in the morning  4, pulmonary fibrosis stable on oxygen as tolerated  5.  Depression continue sertraline  6.  Miscellaneous Lovenox for DVT prophylaxis     Code Status Orders  (From admission, onward)         Start  Ordered   03/10/18 0116  Full code  Continuous     03/10/18 0115        Code Status History    This patient has a current code status but no historical code status.    Advance Directive Documentation     Most Recent Value  Type of Advance Directive  Living will, Healthcare Power of Attorney  Pre-existing out of facility DNR order (yellow form or pink MOST form)  -  "MOST" Form in Place?  -           Consultsortho DVT Prophylaxis  Lovenox   Lab Results  Component Value Date   PLT 189 03/12/2018     Time Spent in minutes   88mn Greater than 50% of time spent in care coordination and counseling patient regarding the condition and plan of care.   SDustin FlockM.D on 03/12/2018 at 2:39 PM  Between 7am to 6pm - Pager - [phone]  After 6pm go to [email or web address] - [email or web address]   Office  [phone]

## 2018-03-12 NOTE — Progress Notes (Signed)
PT Cancellation Note  Patient Details Name: Melanie Rollins MRN: [id number] DOB: 1945-06-28   Cancelled Treatment:    Reason Eval/Treat Not Completed: Medical issues which prohibited therapy. Chart reviewed. Hemoglobin has trended down to 6.9 this AM. Pt currently contraindicated for physical therapy at this time. Will continue to follow and attempt PT treatment once hemoglobin has been corrected.  Lyndel Safe Arya Boxley PT, DPT, GCS  Joseluis Alessio 03/12/2018, 8:41 AM

## 2018-03-12 NOTE — Progress Notes (Signed)
Physical Therapy Treatment Patient Details Name: Melanie Rollins MRN: [id number] DOB: April 29, 1946 Today's Date: 03/12/2018    History of Present Illness  Pt is a 72 y.o. female who complained of left hip pain after a fall.  Pt diagnosed with a L hip fracture and is now s/p L intertrochanteric IM nail.  PMH includes: arthritis, pulmonary fibrosis, Raynaud's syndrome, anxiety/depression, and HLD.     PT Comments    Pt demonstrates good progress with therapy on this date. She continues to report high levels of pain with movement of LLE and transfer to/from [facility]. Pt is able to complete seated exercises at EOB with therapist and pain improves throughout duration of session. Pt will benefit from PT services to address deficits in strength, balance, and mobility in order to return to full function at home after SNF placement.    Follow Up Recommendations  SNF     Equipment Recommendations  Other (comment)(TBD at next venue of care)    Recommendations for Other Services       Precautions / Restrictions Precautions Precautions: Fall Restrictions Weight Bearing Restrictions: No LLE Weight Bearing: Weight bearing as tolerated    Mobility  Bed Mobility               General bed mobility comments: Pt received during transfer to [facility] and left sitting upright at EOB. Bed mobility not assessed  Transfers Overall transfer level: Needs assistance Equipment used: Rolling walker (2 wheeled) Transfers: Sit to/from Stand Sit to Stand: From elevated surface;Mod assist         General transfer comment: Pt with a lot of pain during transfers. Practiced stand to sit transfer onto commode as well as from standing back onto bed. Practiced sit to stand from commode after toileting. Pt with heavy UE reliance and minimal weight shifting to LLE. Requires cues to position LLE during transfers. Pt assisted from [facility] back to bed but is unable to truly ambulate at this time  Ambulation/Gait                  Stairs             Wheelchair Mobility    Modified Rankin (Stroke Patients Only)       Balance Overall balance assessment: Needs assistance Sitting-balance support: Feet supported Sitting balance-Leahy Scale: Good Sitting balance - Comments: Pt leaned to the R to avoid weight bearing on the left hip in sitting Postural control: Right lateral lean Standing balance support: Bilateral upper extremity supported Standing balance-Leahy Scale: Fair Standing balance comment: No LOB in standing but heavy reliance on BUEs for support                            Cognition Arousal/Alertness: Awake/alert Behavior During Therapy: WFL for tasks assessed/performed Overall Cognitive Status: Within Functional Limits for tasks assessed                                        Exercises General Exercises - Lower Extremity Long Arc Quad: Left;10 reps Heel Slides: Left;10 reps Hip ABduction/ADduction: Both;10 reps Hip Flexion/Marching: Left;10 reps Heel Raises: Left;10 reps    General Comments        Pertinent Vitals/Pain Pain Assessment: Faces Faces Pain Scale: Hurts worst Pain Location: L hip Pain Descriptors / Indicators: Sore;Aching Pain Intervention(s): Premedicated before session  Home Living                      Prior Function            PT Goals (current goals can now be found in the care plan section) Acute Rehab PT Goals Patient Stated Goal: To walk again PT Goal Formulation: With patient Time For Goal Achievement: 03/24/18 Potential to Achieve Goals: Good Progress towards PT goals: Progressing toward goals    Frequency    BID      PT Plan Current plan remains appropriate    Co-evaluation              AM-PAC PT "6 Clicks" Daily Activity  Outcome Measure  Difficulty turning over in bed (including adjusting bedclothes, sheets and blankets)?: Unable Difficulty moving from lying on back to sitting  on the side of the bed? : Unable Difficulty sitting down on and standing up from a chair with arms (e.g., wheelchair, bedside commode, etc,.)?: Unable Help needed moving to and from a bed to chair (including a wheelchair)?: A Lot Help needed walking in hospital room?: Total Help needed climbing 3-5 steps with a railing? : Total 6 Click Score: 7    End of Session Equipment Utilized During Treatment: Gait belt;Oxygen Activity Tolerance: Patient limited by pain Patient left: in bed;with call bell/phone within reach;with family/visitor present;Other (comment)(Pt left upright at EOB to minimize time in supine.) Nurse Communication: Mobility status PT Visit Diagnosis: Muscle weakness (generalized) (M62.81);Other abnormalities of gait and mobility (R26.89);Pain Pain - Right/Left: Left Pain - part of body: Hip     Time: 8979-1504 PT Time Calculation (min) (ACUTE ONLY): 26 min  Charges:  $Gait Training: 8-22 mins $Therapeutic Exercise: 8-22 mins                     Lyndel Safe Braylan Faul PT, DPT, GCS    Billy Turvey 03/12/2018, 5:28 PM

## 2018-03-13 LAB — TYPE AND SCREEN
ABO/RH(D): A POS
ANTIBODY SCREEN: NEGATIVE
UNIT DIVISION: 0

## 2018-03-13 LAB — CBC
HEMATOCRIT: 26.1 % — AB (ref 36.0–46.0)
HEMOGLOBIN: 8.4 g/dL — AB (ref 12.0–15.0)
MCH: 29.6 pg (ref 26.0–34.0)
MCHC: 32.2 g/dL (ref 30.0–36.0)
MCV: 91.9 fL (ref 80.0–100.0)
Platelets: 194 10*3/uL (ref 150–400)
RBC: 2.84 MIL/uL — ABNORMAL LOW (ref 3.87–5.11)
RDW: 13.5 % (ref 11.5–15.5)
WBC: 9.8 10*3/uL (ref 4.0–10.5)
nRBC: 0 % (ref 0.0–0.2)

## 2018-03-13 LAB — BPAM RBC
Blood Product Expiration Date: 201911182359
ISSUE DATE / TIME: 201910231207
Unit Type and Rh: 6200

## 2018-03-13 MED ORDER — ASPIRIN 81 MG PO CHEW
81.0000 mg | CHEWABLE_TABLET | Freq: Every day | ORAL | Status: DC
  Administered 2018-03-13 – 2018-03-14 (×2): 81 mg via ORAL
  Filled 2018-03-13 (×2): qty 1

## 2018-03-13 MED ORDER — TRAMADOL HCL 50 MG PO TABS
50.0000 mg | ORAL_TABLET | Freq: Four times a day (QID) | ORAL | Status: DC
  Administered 2018-03-13 – 2018-03-14 (×6): 50 mg via ORAL
  Filled 2018-03-13 (×6): qty 1

## 2018-03-13 NOTE — Progress Notes (Signed)
Per MD patient will not D/C today and will likely be stable for D/C tomorrow. Clinical Social Worker (CSW) made patient aware of above. [facility] admissions coordinator at [facility] is aware of above. Medicare bundle case manager is aware of above.   [employer], LCSW [phone]

## 2018-03-13 NOTE — Care Management Important Message (Signed)
Important Message  Patient Details  Name: Melanie Rollins MRN: [id number] Date of Birth: 08/01/1945   Medicare Important Message Given:  Yes    Juliann Pulse A Grantley Savage 03/13/2018, 11:25 AM

## 2018-03-13 NOTE — Progress Notes (Signed)
Physical Therapy Treatment Patient Details Name: Melanie Rollins MRN: [id number] DOB: 03/23/1946 Today's Date: 03/13/2018    History of Present Illness  Pt is a 72 y.o. female who complained of left hip pain after a fall.  Pt diagnosed with a L hip fracture and is now s/p L intertrochanteric IM nail.  PMH includes: arthritis, pulmonary fibrosis, Raynaud's syndrome, anxiety/depression, and HLD.     PT Comments    Patient able to initiate gait training this date, completing 4' with RW, min/mod assist +2.  Demonstrates 3-point, step to gait pattern with very slow, effortful performance; assist from therapist to advance L LE 50% time. Patient pleased by progress this session, but continues to be limited overall by poor pain control (meds requested and administered during session).    Follow Up Recommendations  SNF     Equipment Recommendations       Recommendations for Other Services       Precautions / Restrictions Precautions Precautions: Fall Restrictions Weight Bearing Restrictions: Yes LLE Weight Bearing: Weight bearing as tolerated    Mobility  Bed Mobility Overal bed mobility: Needs Assistance Bed Mobility: Sit to Supine       Sit to supine: Max assist;+2 for physical assistance      Transfers Overall transfer level: Needs assistance Equipment used: Rolling walker (2 wheeled) Transfers: Sit to/from Stand Sit to Stand: Min assist;Mod assist;+2 physical assistance         General transfer comment: mod +2 progressing to min assist +2 with repetition.  Very slow and guarded, but good efforts to integrate cuing as able.  Ambulation/Gait Ambulation/Gait assistance: Min assist;+2 physical assistance Gait Distance (Feet): 12 Feet Assistive device: Rolling walker (2 wheeled)       General Gait Details: 3-point, step to gait pattern; very slow and guarded.  Assist for L LE advancement at times (maintains in guarded, extended position), limited tolerance for  flexion of L hip/knee in swing.  Step by step cuing for gait sequencing and walker management.  ADditional distance limited by pain and bilat UE fatigue.   Stairs             Wheelchair Mobility    Modified Rankin (Stroke Patients Only)       Balance Overall balance assessment: Needs assistance Sitting-balance support: No upper extremity supported;Feet supported Sitting balance-Leahy Scale: Good     Standing balance support: Bilateral upper extremity supported Standing balance-Leahy Scale: Fair                              Cognition Arousal/Alertness: Awake/alert Behavior During Therapy: WFL for tasks assessed/performed Overall Cognitive Status: Within Functional Limits for tasks assessed                                        Exercises Other Exercises Other Exercises: Sit/stand x3 with RW, mod progressing to min assist +2    General Comments        Pertinent Vitals/Pain Pain Assessment: Faces Faces Pain Scale: Hurts whole lot Pain Location: L hip Pain Descriptors / Indicators: Aching;Grimacing;Guarding Pain Intervention(s): Limited activity within patient's tolerance;Monitored during session;Patient requesting pain meds-RN notified;RN gave pain meds during session;Repositioned    Home Living                      Prior Function  PT Goals (current goals can now be found in the care plan section) Acute Rehab PT Goals Patient Stated Goal: To walk again PT Goal Formulation: With patient Time For Goal Achievement: 03/24/18 Potential to Achieve Goals: Good Progress towards PT goals: Progressing toward goals    Frequency    BID      PT Plan Current plan remains appropriate    Co-evaluation              AM-PAC PT "6 Clicks" Daily Activity  Outcome Measure  Difficulty turning over in bed (including adjusting bedclothes, sheets and blankets)?: Unable Difficulty moving from lying on back to  sitting on the side of the bed? : Unable Difficulty sitting down on and standing up from a chair with arms (e.g., wheelchair, bedside commode, etc,.)?: Unable Help needed moving to and from a bed to chair (including a wheelchair)?: A Lot Help needed walking in hospital room?: A Lot Help needed climbing 3-5 steps with a railing? : Total 6 Click Score: 8    End of Session Equipment Utilized During Treatment: Gait belt;Oxygen Activity Tolerance: Patient tolerated treatment well Patient left: in bed;with call bell/phone within reach;with bed alarm set Nurse Communication: Mobility status PT Visit Diagnosis: Muscle weakness (generalized) (M62.81);Other abnormalities of gait and mobility (R26.89);Pain Pain - Right/Left: Left Pain - part of body: Hip     Time: 6578-4696 PT Time Calculation (min) (ACUTE ONLY): 49 min  Charges:  $Gait Training: 23-37 mins $Therapeutic Activity: 8-22 mins                     Akasia Ahmad H. Owens Shark, PT, DPT, NCS 03/13/18, 3:49 PM [phone]

## 2018-03-13 NOTE — Progress Notes (Signed)
[place] at [facility]                                                                                                                                                                                  Patient Demographics   Melanie Rollins, is a 72 y.o. female, DOB - 1945-11-30, WNU:[id number]  Admit date - 03/09/2018   Admitting Physician Lance Coon, MD  Outpatient Primary MD for the patient is Park Liter P, DO   LOS - 3  Subjective: Continues to have significant pain.  Review of Systems:   CONSTITUTIONAL: No documented fever. No fatigue, weakness. No weight gain, no weight loss.  EYES: No blurry or double vision.  ENT: No tinnitus. No postnasal drip. No redness of the oropharynx.  RESPIRATORY: No cough, no wheeze, no hemoptysis. No dyspnea.  CARDIOVASCULAR: No chest pain. No orthopnea. No palpitations. No syncope.  GASTROINTESTINAL: No nausea, no vomiting or diarrhea. No abdominal pain. No melena or hematochezia.  GENITOURINARY: No dysuria or hematuria.  ENDOCRINE: No polyuria or nocturia. No heat or cold intolerance.  HEMATOLOGY: No anemia. No bruising. No bleeding.  INTEGUMENTARY: No rashes. No lesions.  MUSCULOSKELETAL: No arthritis. No swelling. No gout.  Positive hip pain  nEUROLOGIC: No numbness, tingling, or ataxia. No seizure-type activity.  PSYCHIATRIC: No anxiety. No insomnia. No ADD.    Vitals:   Vitals:   03/12/18 1519 03/13/18 0031 03/13/18 0033 03/13/18 0841  BP: (!) 102/42 (!) 90/36 (!) 101/39 (!) 115/38  Pulse: 69 78 87 70  Resp: 18 18    Temp: 98.7 F (37.1 C) 98.2 F (36.8 C)    TempSrc: Oral Oral    SpO2: 100% 100%    Weight:      Height:        Wt Readings from Last 3 Encounters:  03/10/18 63.5 kg  01/13/18 65.1 kg  12/13/17 64.8 kg     Intake/Output Summary (Last 24 hours) at 03/13/2018 1401 Last data filed at 03/13/2018 0900 Gross per 24 hour  Intake 1037.61 ml  Output 1350 ml  Net -312.39 ml     Physical Exam:   GENERAL: Pleasant-appearing in no apparent distress.  HEAD, EYES, EARS, NOSE AND THROAT: Atraumatic, normocephalic. Extraocular muscles are intact. Pupils equal and reactive to light. Sclerae anicteric. No conjunctival injection. No oro-pharyngeal erythema.  NECK: Supple. There is no jugular venous distention. No bruits, no lymphadenopathy, no thyromegaly.  HEART: Regular rate and rhythm,. No murmurs, no rubs, no clicks.  LUNGS: Clear to auscultation bilaterally. No rales or rhonchi. No wheezes.  ABDOMEN: Soft, flat, nontender, nondistended. Has good bowel sounds. No hepatosplenomegaly appreciated.  EXTREMITIES: No evidence of any  cyanosis, clubbing, or peripheral edema.  +2 pedal and radial pulses bilaterally.  Swelling at the site of surgery NEUROLOGIC: The patient is alert, awake, and oriented x3 with no focal motor or sensory deficits appreciated bilaterally.  SKIN: Moist and warm with no rashes appreciated.  Psych: Not anxious, depressed LN: No inguinal LN enlargement    Antibiotics   Anti-infectives (From admission, onward)   Start     Dose/Rate Route Frequency Ordered Stop   03/10/18 1438  50,000 units bacitracin in 0.9% normal saline 250 mL irrigation  Status:  Discontinued       As needed 03/10/18 1448 03/10/18 1554   03/10/18 1253  ceFAZolin (ANCEF) 2-4 GM/100ML-% IVPB    Note to Pharmacy:  Milinda Cave   : cabinet override      03/10/18 1253 03/10/18 1448   03/10/18 1000  hydroxychloroquine (PLAQUENIL) tablet 400 mg     400 mg Oral Daily 03/10/18 0115     03/10/18 0705  ceFAZolin (ANCEF) IVPB 2g/100 mL premix     2 g 200 mL/hr over 30 Minutes Intravenous 30 min pre-op 03/10/18 0705 03/10/18 1432      Medications   Scheduled Meds: . aspirin  81 mg Oral Daily  . docusate sodium  100 mg Oral BID  . hydroxychloroquine  400 mg Oral Daily  . midodrine  5 mg Oral TID WC  . mycophenolate  1,000 mg Oral BID  . pantoprazole  40 mg Oral Daily  .  polyethylene glycol  17 g Oral Daily  . predniSONE  10 mg Oral Daily  . sertraline  50 mg Oral Daily  . traMADol  50 mg Oral Q6H   Continuous Infusions: . sodium chloride 75 mL/hr at 03/11/18 2104  . lactated ringers Stopped (03/11/18 0831)   PRN Meds:.sodium chloride, acetaminophen **OR** acetaminophen, clonazePAM, methocarbamol, metoCLOPramide **OR** metoCLOPramide (REGLAN) injection, morphine injection, ondansetron **OR** ondansetron (ZOFRAN) IV, oxyCODONE, zolpidem   Data Review:   Micro Results Recent Results (from the past 240 hour(s))  MRSA PCR Screening     Status: None   Collection Time: 03/10/18  1:44 AM  Result Value Ref Range Status   MRSA by PCR NEGATIVE NEGATIVE Final    Comment:        The GeneXpert MRSA Assay (FDA approved for NASAL specimens only), is one component of a comprehensive MRSA colonization surveillance program. It is not intended to diagnose MRSA infection nor to guide or monitor treatment for MRSA infections. Performed at [facility], [address], [place], [place] [address]     Radiology Reports Dg Chest 1 View  Result Date: 03/09/2018 CLINICAL DATA:  Left hip fracture.  History of pulmonary fibrosis. EXAM: CHEST  1 VIEW COMPARISON:  Chest CT 05/08/2015. FINDINGS: Peripheral reticulation, left greater than right with slight basilar predominant gradient, similar to prior CT. Chronic blunting of left costophrenic angle with surgical sutures. No superimposed airspace disease. Unchanged heart size and mediastinal contours, borderline cardiomegaly. No pneumothorax. No visualized rib fractures. Scoliotic curvature of the spine. IMPRESSION: Interstitial lung disease, grossly unchanged from 2016 CT allowing for differences in modality. No acute superimposed abnormality. Electronically Signed   By: Keith Rake M.D.   On: 03/09/2018 23:51   Dg Hip Operative Unilat W Or W/o Pelvis Left  Result Date: 03/10/2018 CLINICAL DATA:  Left hip  fracture EXAM: OPERATIVE left HIP (WITH PELVIS IF PERFORMED) 4 VIEWS TECHNIQUE: Fluoroscopic spot image(s) were submitted for interpretation post-operatively. COMPARISON:  03/09/2018 FINDINGS: Four low resolution  intraoperative spot views of the left hip. Total fluoroscopy time was 1 minutes 36 seconds. Images demonstrate intramedullary rod and screw fixation of the left femur for proximal femoral fracture. IMPRESSION: Intraoperative fluoroscopic assistance provided during surgical fixation of left femur fracture. Electronically Signed   By: Donavan Foil M.D.   On: 03/10/2018 16:42   Dg Hip Unilat With Pelvis 2-3 Views Left  Result Date: 03/09/2018 CLINICAL DATA:  Left hip pain after fall out of bed. EXAM: DG HIP (WITH OR WITHOUT PELVIS) 2-3V LEFT COMPARISON:  None. FINDINGS: Comminuted displaced left proximal femur fracture. Fracture involves the proximal shaft, subtrochanteric and lesser trochanteric regions. There is osseous distraction of 4 cm about the femoral shaft component. Displacement of 7 mm about the lesser trochanteric component. Apex lateral angulation. Femoral head remains seated. Pubic rami are intact. Pubic symphysis and sacroiliac joints are congruent. IMPRESSION: Comminuted displaced left proximal femur fracture involving the proximal femoral shaft, subtrochanteric and lesser trochanteric portions. Significant displacement with mild apex lateral angulation. Electronically Signed   By: Keith Rake M.D.   On: 03/09/2018 23:47     CBC Recent Labs  Lab 03/09/18 2301 03/10/18 0241 03/11/18 0346 03/12/18 0617 03/13/18 0412  WBC 7.7 15.2* 9.5 8.8 9.8  HGB 10.6* 10.3* 8.4* 6.9* 8.4*  HCT 33.4* 31.8* 27.8* 22.4* 26.1*  PLT 235 234 209 189 194  MCV 92.3 91.6 95.9 92.9 91.9  MCH 29.3 29.7 29.0 28.6 29.6  MCHC 31.7 32.4 30.2 30.8 32.2  RDW 12.7 12.7 12.8 12.7 13.5  LYMPHSABS 1.5  --   --   --   --   MONOABS 0.7  --   --   --   --   EOSABS 0.1  --   --   --   --   BASOSABS 0.0   --   --   --   --     Chemistries  Recent Labs  Lab 03/09/18 2301 03/10/18 0241 03/11/18 0346 03/12/18 0617  NA 134* 134* 136 139  K 3.9 3.0* 4.2 3.8  CL 102 101 102 106  CO2 _0 GLUCOSE 114* 111* 110* 115*  BUN _1 CREATININE 0.68 0.61 0.72 0.69  CALCIUM 8.2* 8.0* 8.0* 7.6*   ------------------------------------------------------------------------------------------------------------------ estimated creatinine clearance is 57.8 mL/min (by C-G formula based on SCr of 0.69 mg/dL). ------------------------------------------------------------------------------------------------------------------ No results for input(s): HGBA1C in the last 72 hours. ------------------------------------------------------------------------------------------------------------------ No results for input(s): CHOL, HDL, LDLCALC, TRIG, CHOLHDL, LDLDIRECT in the last 72 hours. ------------------------------------------------------------------------------------------------------------------ No results for input(s): TSH, T4TOTAL, T3FREE, THYROIDAB in the last 72 hours.  Invalid input(s): FREET3 ------------------------------------------------------------------------------------------------------------------ No results for input(s): VITAMINB12, FOLATE, FERRITIN, TIBC, IRON, RETICCTPCT in the last 72 hours.  Coagulation profile Recent Labs  Lab 03/10/18 0241  INR 0.96    No results for input(s): DDIMER in the last 72 hours.  Cardiac Enzymes No results for input(s): CKMB, TROPONINI, MYOGLOBIN in the last 168 hours.  Invalid input(s): CK ------------------------------------------------------------------------------------------------------------------ Invalid input(s): [place]  Patient is 72 year old admitted with hip fracture  1.  Hip fracture status post repair continue PT Still has significant pain related to inflammation around the hip site according to Ortho  scheduled tramadol  2.  Hypotension due to combination of medications and anemia now resolved  3.  Acute blood loss anemia transfuse status post transfusion repeat CBC in the morning  4, pulmonary fibrosis stable on oxygen as tolerated  5.  Depression continue sertraline  6.  Miscellaneous Lovenox for DVT prophylaxis     Code Status Orders  (From admission, onward)         Start     Ordered   03/10/18 0116  Full code  Continuous     03/10/18 0115        Code Status History    This patient has a current code status but no historical code status.    Advance Directive Documentation     Most Recent Value  Type of Advance Directive  Living will, Healthcare Power of Attorney  Pre-existing out of facility DNR order (yellow form or pink MOST form)  -  "MOST" Form in Place?  -           Consultsortho DVT Prophylaxis  Lovenox   Lab Results  Component Value Date   PLT 194 03/13/2018     Time Spent in minutes   38mn Greater than 50% of time spent in care coordination and counseling patient regarding the condition and plan of care.   SDustin FlockM.D on 03/13/2018 at 2:01 PM  Between 7am to 6pm - Pager - [phone]  After 6pm go to [email or web address] - [employer]   Office  [phone]

## 2018-03-13 NOTE — Progress Notes (Signed)
  Subjective:  Patient reports pain as marked.  Worse with movement.  Objective:   VITALS:   Vitals:   03/12/18 1422 03/12/18 1519 03/13/18 0031 03/13/18 0033  BP: (!) 111/41 (!) 102/42 (!) 90/36 (!) 101/39  Pulse: 63 69 78 87  Resp:  18 18   Temp: 98.9 F (37.2 C) 98.7 F (37.1 C) 98.2 F (36.8 C)   TempSrc: Oral Oral Oral   SpO2: 100% 100% 100%   Weight:      Height:        PHYSICAL EXAM:  Sensation intact distally Intact pulses distally Dorsiflexion/Plantar flexion intact Incision: dressing C/D/I No cellulitis present Compartment soft  LABS  Results for orders placed or performed during the hospital encounter of 03/09/18 (from the past 24 hour(s))  Prepare RBC     Status: None   Collection Time: 03/12/18 11:29 AM  Result Value Ref Range   Order Confirmation      ORDER PROCESSED BY BLOOD BANK Performed at [facility], [address] [place], [place] [address]   Urinalysis, Routine w reflex microscopic     Status: Abnormal   Collection Time: 03/12/18 10:20 PM  Result Value Ref Range   Color, Urine YELLOW YELLOW   APPearance CLEAR CLEAR   Specific Gravity, Urine 1.010 1.005 - 1.030   pH 7.0 5.0 - 8.0   Glucose, UA NEGATIVE NEGATIVE mg/dL   Hgb urine dipstick MODERATE (A) NEGATIVE   Bilirubin Urine NEGATIVE NEGATIVE   Ketones, ur NEGATIVE NEGATIVE mg/dL   Protein, ur NEGATIVE NEGATIVE mg/dL   Nitrite NEGATIVE NEGATIVE   Leukocytes, UA LARGE (A) NEGATIVE  Urinalysis, Microscopic (reflex)     Status: Abnormal   Collection Time: 03/12/18 10:20 PM  Result Value Ref Range   RBC / HPF 0-5 0 - 5 RBC/hpf   WBC, UA 11-20 0 - 5 WBC/hpf   Bacteria, UA RARE (A) NONE SEEN   Squamous Epithelial / LPF NONE SEEN 0 - 5  CBC     Status: Abnormal   Collection Time: 03/13/18  4:12 AM  Result Value Ref Range   WBC 9.8 4.0 - 10.5 K/uL   RBC 2.84 (L) 3.87 - 5.11 MIL/uL   Hemoglobin 8.4 (L) 12.0 - 15.0 g/dL   HCT 26.1 (L) 36.0 - 46.0 %   MCV 91.9 80.0 - 100.0  fL   MCH 29.6 26.0 - 34.0 pg   MCHC 32.2 30.0 - 36.0 g/dL   RDW 13.5 11.5 - 15.5 %   Platelets 194 150 - 400 K/uL   nRBC 0.0 0.0 - 0.2 %    No results found.  Assessment/Plan: 3 Days Post-Op   Principal Problem:   Closed left hip fracture (HCC) Active Problems:   Pulmonary fibrosis (HCC)   Anxiety and depression   Hyperlipidemia   Up with therapy  Will change DVT prophylaxis from Lovenox to ASA. She has significant thigh swelling. Continue to ice the area as needed. Will add scheduled Tramadol for pain control. I will see her back for staple removal in 2 weeks.   Lovell Sheehan , MD 03/13/2018, 8:23 AM

## 2018-03-13 NOTE — Progress Notes (Signed)
Physical Therapy Treatment Patient Details Name: Melanie Rollins MRN: [id number] DOB: June 10, 1945 Today's Date: 03/13/2018    History of Present Illness  Pt is a 72 y.o. female who complained of left hip pain after a fall.  Pt diagnosed with a L hip fracture and is now s/p L intertrochanteric IM nail.  PMH includes: arthritis, pulmonary fibrosis, Raynaud's syndrome, anxiety/depression, and HLD.     PT Comments    Patient is making excellent progress with therapy. She moves very slowly during bed mobility secondary to pain. She is able to transfer but requires moderate assistance of 2 in order to stand and minimally shifts weight to her left lower extremity. Once in standing she is able to take short shuffling steps from bed to recliner with heavy assistance from therapist for sequencing with walker. She is able to complete bed exercises as instructed by therapist but has significant pain with all active motion of left lower extremity. Pt will need SNF placement at discharge to facilitate safe return home. Pt will benefit from PT services to address deficits in strength, balance, and mobility in order to return to full function at home.     Follow Up Recommendations  SNF     Equipment Recommendations  Other (comment)(TBD at next venue of care)    Recommendations for Other Services       Precautions / Restrictions Precautions Precautions: Fall Restrictions Weight Bearing Restrictions: No LLE Weight Bearing: Weight bearing as tolerated    Mobility  Bed Mobility Overal bed mobility: Needs Assistance Bed Mobility: Supine to Sit;Sit to Supine     Supine to sit: Max assist     General bed mobility comments: She moves very slowly during bed mobility secondary to pain. Heavy use of bed rails and heavy sequencing by therapist  Transfers Overall transfer level: Needs assistance Equipment used: Rolling walker (2 wheeled) Transfers: Sit to/from Stand Sit to Stand: From elevated  surface;Mod assist;+2 physical assistance         General transfer comment: She is able to transfer but requires moderate assistance of 2 in order to stand and minimally shifts weight to her left lower extremity.  Ambulation/Gait Ambulation/Gait assistance: Mod assist;+2 physical assistance   Assistive device: Rolling walker (2 wheeled) Gait Pattern/deviations: Step-to pattern;Antalgic;Decreased stance time - left Gait velocity: Decreased   General Gait Details: Once in standing she is able to take short shuffling steps from bed to recliner with heavy assistance from therapist for sequencing with walker. Pt moves very slowly secondary to pain and minimally shifts weight to LLE   Stairs             Wheelchair Mobility    Modified Rankin (Stroke Patients Only)       Balance Overall balance assessment: Needs assistance Sitting-balance support: Feet supported Sitting balance-Leahy Scale: Good Sitting balance - Comments: Pt leaned to the R to avoid weight bearing on the left hip in sitting Postural control: Right lateral lean Standing balance support: Bilateral upper extremity supported Standing balance-Leahy Scale: Fair Standing balance comment: No LOB in standing but heavy reliance on BUEs for support                            Cognition Arousal/Alertness: Awake/alert Behavior During Therapy: WFL for tasks assessed/performed Overall Cognitive Status: Within Functional Limits for tasks assessed  General Comments: Tearful secondary to pain      Exercises General Exercises - Lower Extremity Ankle Circles/Pumps: Both;10 reps Quad Sets: Both;10 reps Gluteal Sets: Both;10 reps Short Arc Quad: Left;10 reps Heel Slides: Left;10 reps Hip ABduction/ADduction: Left;10 reps Straight Leg Raises: Left;10 reps    General Comments        Pertinent Vitals/Pain Pain Assessment: Faces Faces Pain Scale: Hurts  worst Pain Location: L hip Pain Descriptors / Indicators: Sore;Aching Pain Intervention(s): Premedicated before session    Home Living                      Prior Function            PT Goals (current goals can now be found in the care plan section) Acute Rehab PT Goals Patient Stated Goal: To walk again PT Goal Formulation: With patient Time For Goal Achievement: 03/24/18 Potential to Achieve Goals: Good Progress towards PT goals: Progressing toward goals    Frequency    BID      PT Plan Current plan remains appropriate    Co-evaluation              AM-PAC PT "6 Clicks" Daily Activity  Outcome Measure  Difficulty turning over in bed (including adjusting bedclothes, sheets and blankets)?: Unable Difficulty moving from lying on back to sitting on the side of the bed? : Unable Difficulty sitting down on and standing up from a chair with arms (e.g., wheelchair, bedside commode, etc,.)?: Unable Help needed moving to and from a bed to chair (including a wheelchair)?: A Lot Help needed walking in hospital room?: A Lot Help needed climbing 3-5 steps with a railing? : Total 6 Click Score: 8    End of Session Equipment Utilized During Treatment: Gait belt;Oxygen Activity Tolerance: Patient limited by pain Patient left: with call bell/phone within reach;Other (comment);in chair;with SCD's reapplied(Pillow under LLE, ) Nurse Communication: Other (comment)(Needs new pad on chair and new external catheter) PT Visit Diagnosis: Muscle weakness (generalized) (M62.81);Other abnormalities of gait and mobility (R26.89);Pain Pain - Right/Left: Left Pain - part of body: Hip     Time: 9806-9996 PT Time Calculation (min) (ACUTE ONLY): 42 min  Charges:  $Gait Training: 8-22 mins $Therapeutic Exercise: 8-22 mins $Therapeutic Activity: 8-22 mins                     Lyndel Safe Tationa Stech PT, DPT, GCS    Macari Zalesky 03/13/2018, 10:55 AM

## 2018-03-14 DIAGNOSIS — M4317 Spondylolisthesis, lumbosacral region: Secondary | ICD-10-CM | POA: Diagnosis not present

## 2018-03-14 DIAGNOSIS — I2510 Atherosclerotic heart disease of native coronary artery without angina pectoris: Secondary | ICD-10-CM | POA: Diagnosis not present

## 2018-03-14 DIAGNOSIS — R531 Weakness: Secondary | ICD-10-CM | POA: Diagnosis not present

## 2018-03-14 DIAGNOSIS — M5030 Other cervical disc degeneration, unspecified cervical region: Secondary | ICD-10-CM | POA: Diagnosis not present

## 2018-03-14 DIAGNOSIS — I480 Paroxysmal atrial fibrillation: Secondary | ICD-10-CM | POA: Diagnosis not present

## 2018-03-14 DIAGNOSIS — F5105 Insomnia due to other mental disorder: Secondary | ICD-10-CM | POA: Diagnosis not present

## 2018-03-14 DIAGNOSIS — M810 Age-related osteoporosis without current pathological fracture: Secondary | ICD-10-CM | POA: Diagnosis not present

## 2018-03-14 DIAGNOSIS — S72002A Fracture of unspecified part of neck of left femur, initial encounter for closed fracture: Secondary | ICD-10-CM | POA: Diagnosis not present

## 2018-03-14 DIAGNOSIS — S72002D Fracture of unspecified part of neck of left femur, subsequent encounter for closed fracture with routine healing: Secondary | ICD-10-CM | POA: Diagnosis not present

## 2018-03-14 DIAGNOSIS — S72002S Fracture of unspecified part of neck of left femur, sequela: Secondary | ICD-10-CM | POA: Diagnosis not present

## 2018-03-14 DIAGNOSIS — Z7401 Bed confinement status: Secondary | ICD-10-CM | POA: Diagnosis not present

## 2018-03-14 DIAGNOSIS — K5909 Other constipation: Secondary | ICD-10-CM | POA: Diagnosis not present

## 2018-03-14 DIAGNOSIS — D62 Acute posthemorrhagic anemia: Secondary | ICD-10-CM | POA: Diagnosis not present

## 2018-03-14 DIAGNOSIS — I959 Hypotension, unspecified: Secondary | ICD-10-CM | POA: Diagnosis not present

## 2018-03-14 DIAGNOSIS — J8410 Pulmonary fibrosis, unspecified: Secondary | ICD-10-CM | POA: Diagnosis not present

## 2018-03-14 DIAGNOSIS — R300 Dysuria: Secondary | ICD-10-CM | POA: Diagnosis not present

## 2018-03-14 DIAGNOSIS — M359 Systemic involvement of connective tissue, unspecified: Secondary | ICD-10-CM | POA: Diagnosis not present

## 2018-03-14 DIAGNOSIS — D649 Anemia, unspecified: Secondary | ICD-10-CM | POA: Diagnosis not present

## 2018-03-14 DIAGNOSIS — M8000XD Age-related osteoporosis with current pathological fracture, unspecified site, subsequent encounter for fracture with routine healing: Secondary | ICD-10-CM | POA: Diagnosis not present

## 2018-03-14 DIAGNOSIS — Z9181 History of falling: Secondary | ICD-10-CM | POA: Diagnosis not present

## 2018-03-14 DIAGNOSIS — R2689 Other abnormalities of gait and mobility: Secondary | ICD-10-CM | POA: Diagnosis not present

## 2018-03-14 DIAGNOSIS — F329 Major depressive disorder, single episode, unspecified: Secondary | ICD-10-CM | POA: Diagnosis not present

## 2018-03-14 DIAGNOSIS — J984 Other disorders of lung: Secondary | ICD-10-CM | POA: Diagnosis not present

## 2018-03-14 DIAGNOSIS — J9611 Chronic respiratory failure with hypoxia: Secondary | ICD-10-CM | POA: Diagnosis not present

## 2018-03-14 DIAGNOSIS — F5101 Primary insomnia: Secondary | ICD-10-CM | POA: Diagnosis not present

## 2018-03-14 DIAGNOSIS — S7225XA Nondisplaced subtrochanteric fracture of left femur, initial encounter for closed fracture: Secondary | ICD-10-CM | POA: Diagnosis not present

## 2018-03-14 DIAGNOSIS — Z9981 Dependence on supplemental oxygen: Secondary | ICD-10-CM | POA: Diagnosis not present

## 2018-03-14 DIAGNOSIS — M8000XS Age-related osteoporosis with current pathological fracture, unspecified site, sequela: Secondary | ICD-10-CM | POA: Diagnosis not present

## 2018-03-14 DIAGNOSIS — F419 Anxiety disorder, unspecified: Secondary | ICD-10-CM | POA: Diagnosis not present

## 2018-03-14 DIAGNOSIS — K219 Gastro-esophageal reflux disease without esophagitis: Secondary | ICD-10-CM | POA: Diagnosis not present

## 2018-03-14 DIAGNOSIS — M6281 Muscle weakness (generalized): Secondary | ICD-10-CM | POA: Diagnosis not present

## 2018-03-14 DIAGNOSIS — M5136 Other intervertebral disc degeneration, lumbar region: Secondary | ICD-10-CM | POA: Diagnosis not present

## 2018-03-14 DIAGNOSIS — N3000 Acute cystitis without hematuria: Secondary | ICD-10-CM | POA: Diagnosis not present

## 2018-03-14 DIAGNOSIS — I7300 Raynaud's syndrome without gangrene: Secondary | ICD-10-CM | POA: Diagnosis not present

## 2018-03-14 DIAGNOSIS — M368 Systemic disorders of connective tissue in other diseases classified elsewhere: Secondary | ICD-10-CM | POA: Diagnosis not present

## 2018-03-14 DIAGNOSIS — E785 Hyperlipidemia, unspecified: Secondary | ICD-10-CM | POA: Diagnosis not present

## 2018-03-14 LAB — CBC
HCT: 25 % — ABNORMAL LOW (ref 36.0–46.0)
HEMOGLOBIN: 8 g/dL — AB (ref 12.0–15.0)
MCH: 29.1 pg (ref 26.0–34.0)
MCHC: 32 g/dL (ref 30.0–36.0)
MCV: 90.9 fL (ref 80.0–100.0)
PLATELETS: 242 10*3/uL (ref 150–400)
RBC: 2.75 MIL/uL — ABNORMAL LOW (ref 3.87–5.11)
RDW: 13.4 % (ref 11.5–15.5)
WBC: 8.4 10*3/uL (ref 4.0–10.5)
nRBC: 0 % (ref 0.0–0.2)

## 2018-03-14 MED ORDER — BISACODYL 10 MG RE SUPP: 10.0000 mg | Freq: Every day | RECTAL | Status: DC | PRN

## 2018-03-14 MED ORDER — BISACODYL 10 MG RE SUPP
10.0000 mg | Freq: Once | RECTAL | Status: AC
  Administered 2018-03-14: 10 mg via RECTAL
  Filled 2018-03-14: qty 1

## 2018-03-14 MED ORDER — FLEET ENEMA 7-19 GM/118ML RE ENEM
1.0000 | ENEMA | Freq: Once | RECTAL | Status: AC
  Administered 2018-03-14: 1 via RECTAL

## 2018-03-14 MED ORDER — ASPIRIN 81 MG PO CHEW: 81.0000 mg | CHEWABLE_TABLET | Freq: Every day | ORAL | Status: DC

## 2018-03-14 MED ORDER — TRAMADOL HCL 50 MG PO TABS: 50.0000 mg | ORAL_TABLET | Freq: Four times a day (QID) | ORAL | 0 refills | Status: AC

## 2018-03-14 MED ORDER — POLYETHYLENE GLYCOL 3350 17 G PO PACK: 17.0000 g | PACK | Freq: Every day | ORAL | 0 refills | Status: DC

## 2018-03-14 MED ORDER — DOCUSATE SODIUM 100 MG PO CAPS: 100.0000 mg | ORAL_CAPSULE | Freq: Two times a day (BID) | ORAL | 0 refills | Status: DC

## 2018-03-14 MED ORDER — OXYCODONE HCL 5 MG PO TABS: 5.0000 mg | ORAL_TABLET | ORAL | 0 refills | Status: DC | PRN

## 2018-03-14 MED ORDER — MAGNESIUM CITRATE PO SOLN
1.0000 | Freq: Once | ORAL | Status: AC
  Administered 2018-03-14: 1 via ORAL
  Filled 2018-03-14: qty 296

## 2018-03-14 NOTE — Progress Notes (Signed)
Pt given enema without results. Notified MD. Await orders.

## 2018-03-14 NOTE — Discharge Summary (Signed)
[place] at [facility], 72 y.o., DOB 05-Oct-1945, MRN [id number]. Admission date: 03/09/2018 Discharge Date 03/14/2018 Primary MD Valerie Roys, DO Admitting Physician Lance Coon, MD  Admission Diagnosis  Closed comminuted fracture of left hip, initial encounter [facility]) [S72.092A]  Discharge Diagnosis   Principal Problem:   Closed left hip fracture ([place]) Acute blood loss anemia Hypotension due to anemia   Pulmonary fibrosis (HCC)   Anxiety and depression   Hyperlipidemia         Hospital Course  Patient 72 year old presented after a fall and was noted to have a closed left hip fracture.  Patient underwent surgery.  Patient had significant pain and was noted to have significant bruising around her thigh.  She also was noted to have a drop in her hemoglobin had to receive transfusion.  She then tolerated the procedure without any complications.  She is doing much better.  Still having pain though.      Check a CBC on Monday at the skilled nursing facility  Follow-up with Dr. Exie Parody of orthopedics in 1 week      Consults  orthopedic surgery  Significant Tests:  See full reports for all details     Dg Chest 1 View  Result Date: 03/09/2018 CLINICAL DATA:  Left hip fracture.  History of pulmonary fibrosis. EXAM: CHEST  1 VIEW COMPARISON:  Chest CT 05/08/2015. FINDINGS: Peripheral reticulation, left greater than right with slight basilar predominant gradient, similar to prior CT. Chronic blunting of left costophrenic angle with surgical sutures. No superimposed airspace disease. Unchanged heart size and mediastinal contours, borderline cardiomegaly. No pneumothorax. No visualized rib fractures. Scoliotic curvature of the spine. IMPRESSION: Interstitial lung disease, grossly unchanged from 2016 CT allowing for differences in modality. No acute superimposed abnormality. Electronically Signed   By: Keith Rake M.D.   On: 03/09/2018  23:51   Dg Hip Operative Unilat W Or W/o Pelvis Left  Result Date: 03/10/2018 CLINICAL DATA:  Left hip fracture EXAM: OPERATIVE left HIP (WITH PELVIS IF PERFORMED) 4 VIEWS TECHNIQUE: Fluoroscopic spot image(s) were submitted for interpretation post-operatively. COMPARISON:  03/09/2018 FINDINGS: Four low resolution intraoperative spot views of the left hip. Total fluoroscopy time was 1 minutes 36 seconds. Images demonstrate intramedullary rod and screw fixation of the left femur for proximal femoral fracture. IMPRESSION: Intraoperative fluoroscopic assistance provided during surgical fixation of left femur fracture. Electronically Signed   By: Donavan Foil M.D.   On: 03/10/2018 16:42   Dg Hip Unilat With Pelvis 2-3 Views Left  Result Date: 03/09/2018 CLINICAL DATA:  Left hip pain after fall out of bed. EXAM: DG HIP (WITH OR WITHOUT PELVIS) 2-3V LEFT COMPARISON:  None. FINDINGS: Comminuted displaced left proximal femur fracture. Fracture involves the proximal shaft, subtrochanteric and lesser trochanteric regions. There is osseous distraction of 4 cm about the femoral shaft component. Displacement of 7 mm about the lesser trochanteric component. Apex lateral angulation. Femoral head remains seated. Pubic rami are intact. Pubic symphysis and sacroiliac joints are congruent. IMPRESSION: Comminuted displaced left proximal femur fracture involving the proximal femoral shaft, subtrochanteric and lesser trochanteric portions. Significant displacement with mild apex lateral angulation. Electronically Signed   By: Keith Rake M.D.   On: 03/09/2018 23:47       Today   Subjective:   Melanie Rollins patient doing better still has some pain  Objective:   Blood pressure (!) 101/53, pulse 85, temperature 98.1 F (36.7 C), temperature source Oral, resp. rate 18,  height _0  (1.6 m), weight 63.5 kg, SpO2 100 %.  .  Intake/Output Summary (Last 24 hours) at 03/14/2018 1216 Last data filed at 03/14/2018  1034 Gross per 24 hour  Intake 360 ml  Output 1200 ml  Net -840 ml    Exam VITAL SIGNS: Blood pressure (!) 101/53, pulse 85, temperature 98.1 F (36.7 C), temperature source Oral, resp. rate 18, height _1  (1.6 m), weight 63.5 kg, SpO2 100 %.  GENERAL:  72 y.o.-year-old patient lying in the bed with no acute distress.  EYES: Pupils equal, round, reactive to light and accommodation. No scleral icterus. Extraocular muscles intact.  HEENT: Head atraumatic, normocephalic. Oropharynx and nasopharynx clear.  NECK:  Supple, no jugular venous distention. No thyroid enlargement, no tenderness.  LUNGS: Normal breath sounds bilaterally, no wheezing, rales,rhonchi or crepitation. No use of accessory muscles of respiration.  CARDIOVASCULAR: S1, S2 normal. No murmurs, rubs, or gallops.  ABDOMEN: Soft, nontender, nondistended. Bowel sounds present. No organomegaly or mass.  EXTREMITIES: No pedal edema, cyanosis, or clubbing.  Left thigh area has bruising NEUROLOGIC: Cranial nerves II through XII are intact. Muscle strength 5/5 in all extremities. Sensation intact. Gait not checked.  PSYCHIATRIC: The patient is alert and oriented x 3.  SKIN: No obvious rash, lesion, or ulcer.   Data Review     CBC w Diff:  Lab Results  Component Value Date   WBC 8.4 03/14/2018   HGB 8.0 (L) 03/14/2018   HGB 12.1 01/13/2018   HCT 25.0 (L) 03/14/2018   HCT 36.7 01/13/2018   PLT 242 03/14/2018   PLT 242 01/13/2018   LYMPHOPCT 20 03/09/2018   MONOPCT 9 03/09/2018   EOSPCT 1 03/09/2018   BASOPCT 0 03/09/2018   CMP:  Lab Results  Component Value Date   NA 139 03/12/2018   NA 140 01/13/2018   K 3.8 03/12/2018   CL 106 03/12/2018   CO2 29 03/12/2018   BUN 8 03/12/2018   BUN 9 01/13/2018   CREATININE 0.69 03/12/2018   PROT 6.2 01/13/2018   ALBUMIN 3.9 01/13/2018   BILITOT <0.2 01/13/2018   ALKPHOS 52 01/13/2018   AST 26 01/13/2018   ALT 18 01/13/2018  .  Micro Results Recent Results (from the  past 240 hour(s))  MRSA PCR Screening     Status: None   Collection Time: 03/10/18  1:44 AM  Result Value Ref Range Status   MRSA by PCR NEGATIVE NEGATIVE Final    Comment:        The GeneXpert MRSA Assay (FDA approved for NASAL specimens only), is one component of a comprehensive MRSA colonization surveillance program. It is not intended to diagnose MRSA infection nor to guide or monitor treatment for MRSA infections. Performed at [facility], [address] [place], [place] [address]         Code Status Orders  (From admission, onward)         Start     Ordered   03/10/18 0116  Full code  Continuous     03/10/18 0115        Code Status History    This patient has a current code status but no historical code status.    Advance Directive Documentation     Most Recent Value  Type of Advance Directive  Living will, Healthcare Power of Attorney  Pre-existing out of facility DNR order (yellow form or pink MOST form)  -  "MOST" Form in Place?  -  Contact information for after-discharge care    Destination    [facility] Preferred SNF .   Service:  Skilled Nursing Contact information: [address] [place] [phone]              Discharge Medications   Allergies as of 03/14/2018      Reactions   Imuran [azathioprine] Nausea Only, Other (See Comments)   Cold chills and bruise like welps      Medication List    TAKE these medications   alendronate 70 MG tablet Commonly known as:  FOSAMAX Take 70 mg by mouth once a week. Take with a full glass of water on an empty stomach.   aspirin 81 MG chewable tablet Chew 1 tablet (81 mg total) by mouth daily. Start taking on:  03/15/2018   Biotin 10000 MCG Tbdp Take 10,000 mcg by mouth daily.   CALCIUM + D PO Take 600 mg by mouth daily.   CINNAMON PO Take 1,000 mg by mouth daily.   cycloSPORINE 0.05 % ophthalmic emulsion Commonly  known as:  RESTASIS Place 1 drop into both eyes 2 (two) times daily.   docusate sodium 100 MG capsule Commonly known as:  COLACE Take 1 capsule (100 mg total) by mouth 2 (two) times daily.   Fish Oil 1000 MG Caps Take 1,000 mg by mouth daily.   hydroxychloroquine 200 MG tablet Commonly known as:  PLAQUENIL 400 mg.   L-Lysine 500 MG Tabs Take 500 mg by mouth daily.   Magnesium 250 MG Tabs Take 250 mg by mouth daily.   multivitamin tablet Take 1 tablet by mouth daily.   mycophenolate 500 MG tablet Commonly known as:  CELLCEPT Take 2 tablets by mouth 2 (two) times daily.   NAC 600 MG Caps Generic drug:  Acetylcysteine Take 600 mg by mouth 2 (two) times daily.   omeprazole 20 MG capsule Commonly known as:  PRILOSEC Take 1 capsule (20 mg total) by mouth daily.   oxyCODONE 5 MG immediate release tablet Commonly known as:  Oxy IR/ROXICODONE Take 1 tablet (5 mg total) by mouth every 4 (four) hours as needed for moderate pain.   polyethylene glycol packet Commonly known as:  MIRALAX / GLYCOLAX Take 17 g by mouth daily. Start taking on:  03/15/2018   predniSONE 10 MG tablet Commonly known as:  DELTASONE Take 10 mg by mouth daily.   sertraline 50 MG tablet Commonly known as:  ZOLOFT Take 1 tablet (50 mg total) by mouth daily.   traMADol 50 MG tablet Commonly known as:  ULTRAM Take 1 tablet (50 mg total) by mouth every 6 (six) hours for 2 days.   Vitamin D 2000 units Caps Take 2,000 Units by mouth daily.   zolpidem 10 MG tablet Commonly known as:  AMBIEN Take 0.5 tablets (5 mg total) by mouth at bedtime as needed for sleep.          Total Time in preparing paper work, data evaluation and todays exam - 26 minutes  Dustin Flock M.D on 03/14/2018 at 12:16 PM [place]  [phone]

## 2018-03-14 NOTE — Clinical Social Work Placement (Signed)
CLINICAL SOCIAL WORK PLACEMENT  NOTE  Date:  03/14/2018  Patient Details  Name: Melanie Rollins MRN: [id number] Date of Birth: 11/26/1945  Clinical Social Work is seeking post-discharge placement for this patient at the [place] level of care (*CSW will initial, date and re-position this form in  chart as items are completed):  Yes   Patient/family provided with [place] Work Department's list of facilities offering this level of care within the geographic area requested by the patient (or if unable, by the patient's family).  Yes   Patient/family informed of their freedom to choose among providers that offer the needed level of care, that participate in Medicare, Medicaid or managed care program needed by the patient, have an available bed and are willing to accept the patient.  Yes   Patient/family informed of [facility]'s ownership interest in [facility] and [facility], as well as of the fact that they are under no obligation to receive care at these facilities.  PASRR submitted to EDS on 03/10/18     PASRR number received on 03/10/18     Existing PASRR number confirmed on       FL2 transmitted to all facilities in geographic area requested by pt/family on 03/10/18     FL2 transmitted to all facilities within larger geographic area on       Patient informed that his/her managed care company has contracts with or will negotiate with certain facilities, including the following:        Yes   Patient/family informed of bed offers received.  Patient chooses bed at [facility] )     Physician recommends and patient chooses bed at      Patient to be transferred to [facility] ) on 03/14/18.  Patient to be transferred to facility by [facility] )     Patient family notified on 03/14/18 of transfer.  Name of family member notified:  (Patient's husband [name] is aware of D/C today. )     PHYSICIAN       Additional  Comment:    _______________________________________________ Juleah Paradise, Veronia Beets, LCSW 03/14/2018, 1:46 PM

## 2018-03-14 NOTE — Progress Notes (Signed)
Patient is medically stable for D/C to [facility] today. Per [facility] admissions coordinator at [facility] patient can come today to room 202-A. RN will call report at [phone] and arrange EMS for transport. Clinical Education officer, museum (CSW) sent D/C orders to [employer] [facility] via [employer]. Patient is aware of above. CSW contacted patient's husband [name] and made him aware of above. Medicare bundle case manager is aware of above. Please reconsult if future social work needs arise. CSW signing off.   [employer], LCSW [phone]

## 2018-03-14 NOTE — Progress Notes (Signed)
Called EMS for transport.

## 2018-03-14 NOTE — Plan of Care (Signed)

## 2018-03-14 NOTE — Progress Notes (Signed)
Physical Therapy Treatment Patient Details Name: Melanie Rollins MRN: [id number] DOB: 1946/04/17 Today's Date: 03/14/2018    History of Present Illness  Pt is a 72 y.o. female who complained of left hip pain after a fall.  Pt diagnosed with a L hip fracture and is now s/p L intertrochanteric IM nail.  PMH includes: arthritis, pulmonary fibrosis, Raynaud's syndrome, anxiety/depression, and HLD.     PT Comments    Remains very guarded and effortful with all functional mobility tasks; consistently reporting high levels of pain to L hip.  Gradual progression in gait distance, but with increased encouragement from therapist.   Follow Up Recommendations  SNF     Equipment Recommendations       Recommendations for Other Services       Precautions / Restrictions Precautions Precautions: Fall Restrictions Weight Bearing Restrictions: Yes LLE Weight Bearing: Weight bearing as tolerated    Mobility  Bed Mobility Overal bed mobility: Needs Assistance Bed Mobility: Supine to Sit     Supine to sit: Mod assist     General bed mobility comments: very guarded posturing, hesitant for flexion of trunk, L hip  Transfers Overall transfer level: Needs assistance Equipment used: Rolling walker (2 wheeled) Transfers: Sit to/from Stand Sit to Stand: Mod assist;+2 physical assistance         General transfer comment: recurrent cuing for hand/foot placement, movement mechanics to maximize safety/indep with transfers  Ambulation/Gait Ambulation/Gait assistance: Mod assist;+2 physical assistance Gait Distance (Feet): 15 Feet Assistive device: Rolling walker (2 wheeled)       General Gait Details: 3-point, step to gait pattern; encouarged for continuous stepping pattern as able for improved efficiency.  Excessive weight shift to R LE to unweight/advance L LE; continues with limited hip/knee flexion throughout gait cycle (L)   Stairs             Wheelchair Mobility     Modified Rankin (Stroke Patients Only)       Balance Overall balance assessment: Needs assistance Sitting-balance support: No upper extremity supported;Feet supported Sitting balance-Leahy Scale: Fair Sitting balance - Comments: R lateral weight shift to unweight L LE for pain control   Standing balance support: Bilateral upper extremity supported Standing balance-Leahy Scale: Fair                              Cognition Arousal/Alertness: Awake/alert Behavior During Therapy: WFL for tasks assessed/performed Overall Cognitive Status: Within Functional Limits for tasks assessed                                        Exercises      General Comments        Pertinent Vitals/Pain Pain Assessment: 0-10 Pain Score: 8  Pain Location: L hip Pain Descriptors / Indicators: Aching;Grimacing;Guarding Pain Intervention(s): Limited activity within patient's tolerance;Monitored during session;Premedicated before session;Repositioned    Home Living                      Prior Function            PT Goals (current goals can now be found in the care plan section) Acute Rehab PT Goals Patient Stated Goal: To walk again PT Goal Formulation: With patient Time For Goal Achievement: 03/24/18 Potential to Achieve Goals: Good Progress towards PT goals: Progressing toward goals  Frequency    BID      PT Plan Current plan remains appropriate    Co-evaluation              AM-PAC PT "6 Clicks" Daily Activity  Outcome Measure  Difficulty turning over in bed (including adjusting bedclothes, sheets and blankets)?: Unable Difficulty moving from lying on back to sitting on the side of the bed? : Unable Difficulty sitting down on and standing up from a chair with arms (e.g., wheelchair, bedside commode, etc,.)?: Unable Help needed moving to and from a bed to chair (including a wheelchair)?: A Lot Help needed walking in hospital room?: A  Lot Help needed climbing 3-5 steps with a railing? : Total 6 Click Score: 8    End of Session Equipment Utilized During Treatment: Gait belt;Oxygen Activity Tolerance: Patient tolerated treatment well;Patient limited by pain Patient left: in chair;with call bell/phone within reach;with chair alarm set Nurse Communication: Mobility status PT Visit Diagnosis: Muscle weakness (generalized) (M62.81);Other abnormalities of gait and mobility (R26.89);Pain Pain - Right/Left: Left Pain - part of body: Hip     Time: 1041-1106 PT Time Calculation (min) (ACUTE ONLY): 25 min  Charges:  $Gait Training: 8-22 mins $Therapeutic Activity: 8-22 mins                     Cheyan Frees H. Owens Shark, PT, DPT, NCS 03/14/18, 4:05 PM [phone]

## 2018-03-17 DIAGNOSIS — M8000XD Age-related osteoporosis with current pathological fracture, unspecified site, subsequent encounter for fracture with routine healing: Secondary | ICD-10-CM | POA: Diagnosis not present

## 2018-03-17 DIAGNOSIS — M359 Systemic involvement of connective tissue, unspecified: Secondary | ICD-10-CM | POA: Diagnosis not present

## 2018-03-17 DIAGNOSIS — J9611 Chronic respiratory failure with hypoxia: Secondary | ICD-10-CM | POA: Insufficient documentation

## 2018-03-17 DIAGNOSIS — M8000XA Age-related osteoporosis with current pathological fracture, unspecified site, initial encounter for fracture: Secondary | ICD-10-CM | POA: Insufficient documentation

## 2018-03-17 DIAGNOSIS — M368 Systemic disorders of connective tissue in other diseases classified elsewhere: Secondary | ICD-10-CM | POA: Diagnosis not present

## 2018-03-17 DIAGNOSIS — J984 Other disorders of lung: Secondary | ICD-10-CM | POA: Diagnosis not present

## 2018-03-18 ENCOUNTER — Other Ambulatory Visit
Admission: RE | Admit: 2018-03-18 | Discharge: 2018-03-18 | Disposition: A | Discharge status: Home / Self Care | Payer: Medicare Other | Source: Ambulatory Visit | Attending: Internal Medicine | Admitting: Internal Medicine

## 2018-03-18 DIAGNOSIS — D62 Acute posthemorrhagic anemia: Secondary | ICD-10-CM | POA: Insufficient documentation

## 2018-03-18 LAB — CBC WITH DIFFERENTIAL/PLATELET
ABS IMMATURE GRANULOCYTES: 0.1 10*3/uL — AB (ref 0.00–0.07)
BASOS PCT: 0 %
Basophils Absolute: 0 10*3/uL (ref 0.0–0.1)
EOS ABS: 0.2 10*3/uL (ref 0.0–0.5)
Eosinophils Relative: 2 %
HCT: 27.5 % — ABNORMAL LOW (ref 36.0–46.0)
Hemoglobin: 8.7 g/dL — ABNORMAL LOW (ref 12.0–15.0)
IMMATURE GRANULOCYTES: 1 %
Lymphocytes Relative: 9 %
Lymphs Abs: 1 10*3/uL (ref 0.7–4.0)
MCH: 29.3 pg (ref 26.0–34.0)
MCHC: 31.6 g/dL (ref 30.0–36.0)
MCV: 92.6 fL (ref 80.0–100.0)
Monocytes Absolute: 1.1 10*3/uL — ABNORMAL HIGH (ref 0.1–1.0)
Monocytes Relative: 10 %
NEUTROS ABS: 7.9 10*3/uL — AB (ref 1.7–7.7)
NRBC: 0 % (ref 0.0–0.2)
Neutrophils Relative %: 78 %
PLATELETS: 362 10*3/uL (ref 150–400)
RBC: 2.97 MIL/uL — AB (ref 3.87–5.11)
RDW: 13.1 % (ref 11.5–15.5)
WBC: 10.3 10*3/uL (ref 4.0–10.5)

## 2018-03-19 ENCOUNTER — Other Ambulatory Visit
Admission: RE | Admit: 2018-03-19 | Discharge: 2018-03-19 | Disposition: A | Discharge status: Home / Self Care | Payer: No Typology Code available for payment source | Source: Ambulatory Visit | Attending: Internal Medicine | Admitting: Internal Medicine

## 2018-03-19 DIAGNOSIS — R300 Dysuria: Secondary | ICD-10-CM | POA: Insufficient documentation

## 2018-03-19 LAB — URINALYSIS, ROUTINE W REFLEX MICROSCOPIC
Bacteria, UA: NONE SEEN
Bilirubin Urine: NEGATIVE
GLUCOSE, UA: NEGATIVE mg/dL
Ketones, ur: NEGATIVE mg/dL
Nitrite: POSITIVE — AB
Protein, ur: 30 mg/dL — AB
SQUAMOUS EPITHELIAL / LPF: NONE SEEN (ref 0–5)
Specific Gravity, Urine: 1.013 (ref 1.005–1.030)
WBC, UA: >50 WBC/hpf — ABNORMAL HIGH (ref 0–5)
pH: 6 (ref 5.0–8.0)

## 2018-03-20 ENCOUNTER — Other Ambulatory Visit
Admission: RE | Admit: 2018-03-20 | Discharge: 2018-03-20 | Disposition: A | Discharge status: Home / Self Care | Payer: Medicare Other | Source: Ambulatory Visit | Attending: Internal Medicine | Admitting: Internal Medicine

## 2018-03-20 DIAGNOSIS — D649 Anemia, unspecified: Secondary | ICD-10-CM | POA: Insufficient documentation

## 2018-03-20 LAB — CBC WITH DIFFERENTIAL/PLATELET
ABS IMMATURE GRANULOCYTES: 0.16 10*3/uL — AB (ref 0.00–0.07)
Basophils Absolute: 0 10*3/uL (ref 0.0–0.1)
Basophils Relative: 0 %
EOS PCT: 3 %
Eosinophils Absolute: 0.3 10*3/uL (ref 0.0–0.5)
HEMATOCRIT: 27.8 % — AB (ref 36.0–46.0)
Hemoglobin: 8.8 g/dL — ABNORMAL LOW (ref 12.0–15.0)
Immature Granulocytes: 2 %
Lymphocytes Relative: 13 %
Lymphs Abs: 1.1 10*3/uL (ref 0.7–4.0)
MCH: 29.5 pg (ref 26.0–34.0)
MCHC: 31.7 g/dL (ref 30.0–36.0)
MCV: 93.3 fL (ref 80.0–100.0)
MONO ABS: 0.9 10*3/uL (ref 0.1–1.0)
MONOS PCT: 10 %
Neutro Abs: 6.6 10*3/uL (ref 1.7–7.7)
Neutrophils Relative %: 72 %
Platelets: 406 10*3/uL — ABNORMAL HIGH (ref 150–400)
RBC: 2.98 MIL/uL — ABNORMAL LOW (ref 3.87–5.11)
RDW: 13.8 % (ref 11.5–15.5)
WBC: 9.1 10*3/uL (ref 4.0–10.5)
nRBC: 0 % (ref 0.0–0.2)

## 2018-03-21 ENCOUNTER — Encounter: Payer: Self-pay | Admitting: Adult Health

## 2018-03-21 ENCOUNTER — Non-Acute Institutional Stay (SKILLED_NURSING_FACILITY): Payer: Medicare Other | Admitting: Adult Health

## 2018-03-21 DIAGNOSIS — M368 Systemic disorders of connective tissue in other diseases classified elsewhere: Secondary | ICD-10-CM | POA: Diagnosis not present

## 2018-03-21 DIAGNOSIS — F329 Major depressive disorder, single episode, unspecified: Secondary | ICD-10-CM

## 2018-03-21 DIAGNOSIS — S72002S Fracture of unspecified part of neck of left femur, sequela: Secondary | ICD-10-CM | POA: Diagnosis not present

## 2018-03-21 DIAGNOSIS — M8000XA Age-related osteoporosis with current pathological fracture, unspecified site, initial encounter for fracture: Secondary | ICD-10-CM | POA: Insufficient documentation

## 2018-03-21 DIAGNOSIS — K5909 Other constipation: Secondary | ICD-10-CM | POA: Diagnosis not present

## 2018-03-21 DIAGNOSIS — J8410 Pulmonary fibrosis, unspecified: Secondary | ICD-10-CM | POA: Diagnosis not present

## 2018-03-21 DIAGNOSIS — F5101 Primary insomnia: Secondary | ICD-10-CM | POA: Diagnosis not present

## 2018-03-21 DIAGNOSIS — M8000XS Age-related osteoporosis with current pathological fracture, unspecified site, sequela: Secondary | ICD-10-CM

## 2018-03-21 DIAGNOSIS — J984 Other disorders of lung: Secondary | ICD-10-CM

## 2018-03-21 DIAGNOSIS — N390 Urinary tract infection, site not specified: Secondary | ICD-10-CM | POA: Insufficient documentation

## 2018-03-21 DIAGNOSIS — M359 Systemic involvement of connective tissue, unspecified: Secondary | ICD-10-CM | POA: Diagnosis not present

## 2018-03-21 DIAGNOSIS — K219 Gastro-esophageal reflux disease without esophagitis: Secondary | ICD-10-CM | POA: Diagnosis not present

## 2018-03-21 DIAGNOSIS — F32A Depression, unspecified: Secondary | ICD-10-CM

## 2018-03-21 DIAGNOSIS — N3000 Acute cystitis without hematuria: Secondary | ICD-10-CM

## 2018-03-21 DIAGNOSIS — F419 Anxiety disorder, unspecified: Secondary | ICD-10-CM

## 2018-03-21 NOTE — Progress Notes (Signed)
[facility]   [facility] Room Number: 202 A Place of Service:  SNF (31)   CODE STATUS: Full Code  Allergies  Allergen Reactions  . Imuran [Azathioprine] Nausea Only and Other (See Comments)    Cold chills and bruise like welps    Chief Complaint  Patient presents with  . Medical Management of Chronic Issues    Closed left hip fracture sequela; pulmonary fibrosis; connective tissue disease; weekly follow up for the first 30 days post hospitalization     HPI:  She is a 72 year old short term rehab patient being seen for the management of her chronic illnesses: left hip fracture; pulmonary fibrosis; connective tissue disease. She is here for short term rehab for her left hip fracture. She continues to participate in therapy. She does have burning on urination; no fevers present; no uncontrolled pain.   Past Medical History:  Diagnosis Date  . Arthritis    RA  . Hyperlipidemia   . Pulmonary fibrosis ([place])   . Reynolds syndrome [facility])     Past Surgical History:  Procedure Laterality Date  . INTRAMEDULLARY (IM) NAIL INTERTROCHANTERIC Left 03/10/2018   Procedure: INTRAMEDULLARY (IM) NAIL INTERTROCHANTRIC-LEFT TFNA;  Surgeon: Lovell Sheehan, MD;  Location: [facility] ORS;  Service: Orthopedics;  Laterality: Left;  . kidney stones    . LUNG BIOPSY      Social History   Socioeconomic History  . Marital status: Married    Spouse name: Not on file  . Number of children: Not on file  . Years of education: Not on file  . Highest education level: Not on file  Occupational History  . Not on file  Social Needs  . Financial resource strain: Not hard at all  . Food insecurity:    Worry: Never true    Inability: Never true  . Transportation needs:    Medical: No    Non-medical: No  Tobacco Use  . Smoking status: Never Smoker  . Smokeless tobacco: Never Used  Substance and Sexual Activity  . Alcohol use: Yes    Alcohol/week: 0.0 standard drinks   Comment: pt states she drinks wine on occasion  . Drug use: No  . Sexual activity: Yes  Lifestyle  . Physical activity:    Days per week: 0 days    Minutes per session: 0 min  . Stress: Not at all  Relationships  . Social connections:    Talks on phone: More than three times a week    Gets together: More than three times a week    Attends religious service: More than 4 times per year    Active member of club or organization: Yes    Attends meetings of clubs or organizations: More than 4 times per year    Relationship status: Married  . Intimate partner violence:    Fear of current or ex partner: No    Emotionally abused: No    Physically abused: No    Forced sexual activity: No  Other Topics Concern  . Not on file  Social History Narrative  . Not on file   Family History  Problem Relation Age of Onset  . Hypertension Mother   . Heart disease Mother        pacemaker and CHF  . Glaucoma Mother   . Hypertension Brother   . Arthritis Sister   . Heart disease Maternal Grandmother   . Heart disease Maternal Grandfather   . Hypertension Paternal Grandmother   .  Stroke Paternal Grandmother   . Cancer Paternal Grandfather       VITAL SIGNS BP (!) 122/56   Pulse 70   Temp 98 F (36.7 C)   Resp 20   Ht _0  (1.6 m)   Wt 150 lb 9.6 oz (68.3 kg)   LMP  (LMP Unknown)   SpO2 100%   BMI 26.68 kg/m   Outpatient Encounter Medications as of 03/21/2018  Medication Sig  . acetaminophen (TYLENOL) 325 MG tablet Take 650 mg by mouth every 6 (six) hours.  . Acetylcysteine (NAC) 600 MG CAPS Take 600 mg by mouth 2 (two) times daily.  Marland Kitchen alendronate (FOSAMAX) 70 MG tablet Take 70 mg by mouth once a week. Takes on Monday.  Take with a full glass of water on an empty stomach.  Marland Kitchen aspirin 325 MG tablet Take 325 mg by mouth daily.  . Biotin 5 MG CAPS Take 5 mcg by mouth daily.   . Calcium Citrate-Vitamin D (CALCIUM + D PO) Take 600 mg by mouth daily.  . Cholecalciferol (VITAMIN D) 2000  units CAPS Take 2,000 Units by mouth daily.  Marland Kitchen CINNAMON PO Take 1,000 mg by mouth daily.  . cycloSPORINE (RESTASIS) 0.05 % ophthalmic emulsion Place 1 drop into both eyes 2 (two) times daily.  Marland Kitchen docusate sodium (COLACE) 100 MG capsule Take 1 capsule (100 mg total) by mouth 2 (two) times daily.  . hydroxychloroquine (PLAQUENIL) 200 MG tablet Take 400 mg by mouth daily.   Marland Kitchen L-Lysine 500 MG TABS Take 500 mg by mouth daily.  . Multiple Vitamin (MULTIVITAMIN) tablet Take 1 tablet by mouth daily.  . mycophenolate (CELLCEPT) 500 MG tablet Take 2 tablets by mouth 2 (two) times daily.  . NON FORMULARY Diet Type:  Regular  . Omega-3 Fatty Acids (FISH OIL) 1000 MG CAPS Take 1,000 mg by mouth daily.  Marland Kitchen omeprazole (PRILOSEC) 20 MG capsule Take 1 capsule (20 mg total) by mouth daily.  Marland Kitchen oxyCODONE (OXY IR/ROXICODONE) 5 MG immediate release tablet Take 1 tablet (5 mg total) by mouth every 4 (four) hours as needed for moderate pain.  . OXYGEN 2 L/min by Continuous infusion (non-IV) route daily.  . polyethylene glycol (MIRALAX / GLYCOLAX) packet Take 17 g by mouth daily.  . predniSONE (DELTASONE) 10 MG tablet Take 10 mg by mouth daily.  Marland Kitchen senna (SENOKOT) 8.6 MG TABS tablet Take 2 tablets by mouth 2 (two) times daily as needed for mild constipation.  . sertraline (ZOLOFT) 50 MG tablet Take 1 tablet (50 mg total) by mouth daily.  Marland Kitchen sulfamethoxazole-trimethoprim (BACTRIM DS,SEPTRA DS) 800-160 MG tablet Take by mouth. Give 1 tablet by mouth on Monday, Wednesday and Friday  . zolpidem (AMBIEN) 10 MG tablet Take 5 mg by mouth at bedtime.   No facility-administered encounter medications on file as of 03/21/2018.      SIGNIFICANT DIAGNOSTIC EXAMS  LABS REVIEWED: TODAY:   03-12-18: wbc 8.8; hgb 6.9; hct 22.4; mcv 92,9; plt 189;  03-13-18: wbc 9.8; hgb 8.4; hct 26.1; mcv 91.9; plt 194;  03-14-18: wbc 8.4; hgb 8.0; hct 25.0; mcv 90.9; plt 242 03-18-18: wbc 10.3; hgb 8.7; hct 27.5; mcv 92.6; plt 362 03-19-18: urine  culture: gram neg rod 03-20-18: wbc 9.1; hgb 8.8; hct 27.8; mcv 93.3; plt 406   Review of Systems  Constitutional: Negative for malaise/fatigue.  Respiratory: Negative for cough and shortness of breath.   Cardiovascular: Negative for chest pain, palpitations and leg swelling.  Gastrointestinal: Negative for abdominal pain,  constipation and heartburn.  Genitourinary: Positive for dysuria.  Musculoskeletal: Negative for back pain, joint pain and myalgias.  Skin: Negative.   Neurological: Negative for dizziness.  Psychiatric/Behavioral: The patient is not nervous/anxious.     Physical Exam  Constitutional: She is oriented to person, place, and time. She appears well-developed and well-nourished. No distress.  Thin   Neck: No thyromegaly present.  Cardiovascular: Normal rate, regular rhythm, normal heart sounds and intact distal pulses.  Pulmonary/Chest: Effort normal and breath sounds normal. No respiratory distress.  Abdominal: Soft. Bowel sounds are normal. She exhibits no distension. There is no tenderness.  Musculoskeletal: She exhibits edema.  Has trace amount left leg edema Left leg bruising present Is status post left intertrochanteric nailing Is able to move all extremities    Lymphadenopathy:    She has no cervical adenopathy.  Neurological: She is alert and oriented to person, place, and time.  Skin: Skin is warm and dry. She is not diaphoretic.  Psychiatric: She has a normal mood and affect.    ASSESSMENT/ PLAN:  1. Closed left hip fracture, sequela: is stable will continue therapy as directed; will follow up with orthopedics; is taking tylenol 650 mg every 6 hours and has oxycodone 5 mg every 4 hours as needed for pain.   2.  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 Connective  tissue disease: is without change will continue plaquenil 400 mg daily   4.  Age related osteoporosis with current pathological fracture: is stable will continue fosamax 70 mg weekly and supplements  5. GERD without esophagitis: is stable will continue prilosec 20 mg daily   6. Chronic constipation: is stable will continue colace twice daily miralax daily and senna s tabs twice daily as needed   7. Insomnia: is stable will continue ambien 5 mg nightly   8. Anxiety and  depression: is stable will continue zoloft 50 mg daily   9. UTI: will begin cipro 250 mg twice daily for 5 days and will monitor  10. Acute blood loss anemia: is stable hgb  8.8 is status post transfusion in the hospital; will monitor    MD is aware of resident's narcotic use and is in agreement with current plan of care. We will attempt to wean resident as apropriate   Ok Edwards NP [facility]  Contact [phone] Monday through Friday 8am- 5pm  After hours call [phone]

## 2018-03-22 LAB — URINE CULTURE: Culture: >=100000 — AB

## 2018-03-24 ENCOUNTER — Other Ambulatory Visit: Payer: Self-pay | Admitting: Adult Health

## 2018-03-24 MED ORDER — ZOLPIDEM TARTRATE 10 MG PO TABS: 5.0000 mg | ORAL_TABLET | Freq: Every day | ORAL | 0 refills | Status: DC

## 2018-03-25 ENCOUNTER — Encounter
Admission: RE | Admit: 2018-03-25 | Discharge: 2018-03-25 | Disposition: A | Discharge status: Home / Self Care | Payer: Medicare Other | Source: Ambulatory Visit | Attending: Internal Medicine | Admitting: Internal Medicine

## 2018-03-25 ENCOUNTER — Non-Acute Institutional Stay (SKILLED_NURSING_FACILITY): Payer: Medicare Other | Admitting: Adult Health

## 2018-03-25 ENCOUNTER — Other Ambulatory Visit: Payer: Self-pay | Admitting: Adult Health

## 2018-03-25 ENCOUNTER — Encounter: Payer: Self-pay | Admitting: Adult Health

## 2018-03-25 DIAGNOSIS — J8410 Pulmonary fibrosis, unspecified: Secondary | ICD-10-CM

## 2018-03-25 DIAGNOSIS — S72002S Fracture of unspecified part of neck of left femur, sequela: Secondary | ICD-10-CM | POA: Diagnosis not present

## 2018-03-25 DIAGNOSIS — F329 Major depressive disorder, single episode, unspecified: Secondary | ICD-10-CM

## 2018-03-25 DIAGNOSIS — J984 Other disorders of lung: Secondary | ICD-10-CM

## 2018-03-25 DIAGNOSIS — M368 Systemic disorders of connective tissue in other diseases classified elsewhere: Secondary | ICD-10-CM

## 2018-03-25 DIAGNOSIS — F419 Anxiety disorder, unspecified: Principal | ICD-10-CM

## 2018-03-25 DIAGNOSIS — S7225XA Nondisplaced subtrochanteric fracture of left femur, initial encounter for closed fracture: Secondary | ICD-10-CM | POA: Diagnosis not present

## 2018-03-25 MED ORDER — CYCLOSPORINE 0.05 % OP EMUL: 1.0000 [drp] | Freq: Two times a day (BID) | OPHTHALMIC | 0 refills | Status: DC

## 2018-03-25 MED ORDER — MYCOPHENOLATE MOFETIL 500 MG PO TABS: 1000.0000 mg | ORAL_TABLET | Freq: Two times a day (BID) | ORAL | 0 refills | Status: AC

## 2018-03-25 MED ORDER — ZOLPIDEM TARTRATE 10 MG PO TABS: 5.0000 mg | ORAL_TABLET | Freq: Every day | ORAL | 0 refills | Status: DC

## 2018-03-25 MED ORDER — SERTRALINE HCL 50 MG PO TABS: 50.0000 mg | ORAL_TABLET | Freq: Every day | ORAL | 0 refills | Status: DC

## 2018-03-25 MED ORDER — ALENDRONATE SODIUM 70 MG PO TABS: 70.0000 mg | ORAL_TABLET | ORAL | 0 refills | Status: DC

## 2018-03-25 MED ORDER — ACETYLCYSTEINE 600 MG PO CAPS: 600.0000 mg | ORAL_CAPSULE | Freq: Two times a day (BID) | ORAL | 0 refills | Status: AC

## 2018-03-25 MED ORDER — PREDNISONE 10 MG PO TABS: 10.0000 mg | ORAL_TABLET | Freq: Every day | ORAL | 0 refills | Status: DC

## 2018-03-25 MED ORDER — SULFAMETHOXAZOLE-TRIMETHOPRIM 800-160 MG PO TABS: 1.0000 | ORAL_TABLET | ORAL | 0 refills | Status: DC

## 2018-03-25 MED ORDER — HYDROXYCHLOROQUINE SULFATE 200 MG PO TABS: 400.0000 mg | ORAL_TABLET | Freq: Every day | ORAL | 0 refills | Status: AC

## 2018-03-25 MED ORDER — OXYCODONE HCL 5 MG PO TABS: 5.0000 mg | ORAL_TABLET | ORAL | 0 refills | Status: DC | PRN

## 2018-03-25 NOTE — Progress Notes (Signed)
[facility]:   [facility] Room Number: 202 A Place of Service:  SNF (31)    CODE STATUS: Full Code  Allergies  Allergen Reactions  . Imuran [Azathioprine] Nausea Only and Other (See Comments)    Cold chills and bruise like welps    Chief Complaint  Patient presents with  . Discharge Note    Discharging to home on 03/28/18    HPI:  She is being discharged to home with outpatient PT; which will start on 04-01-18. She will not need any dme; she has all necessary equipment. She will need her prescriptions written and will need to follow up with her medical provider. She had been hospitalized for a left hip fracture; admitted to this facility for short term rehab and is ready for discharge to home.    Past Medical History:  Diagnosis Date  . Arthritis    RA  . Hyperlipidemia   . Pulmonary fibrosis ([place])   . Reynolds syndrome [facility])     Past Surgical History:  Procedure Laterality Date  . INTRAMEDULLARY (IM) NAIL INTERTROCHANTERIC Left 03/10/2018   Procedure: INTRAMEDULLARY (IM) NAIL INTERTROCHANTRIC-LEFT TFNA;  Surgeon: Lovell Sheehan, MD;  Location: [facility] ORS;  Service: Orthopedics;  Laterality: Left;  . kidney stones    . LUNG BIOPSY      Social History   Socioeconomic History  . Marital status: Married    Spouse name: Not on file  . Number of children: Not on file  . Years of education: Not on file  . Highest education level: Not on file  Occupational History  . Not on file  Social Needs  . Financial resource strain: Not hard at all  . Food insecurity:    Worry: Never true    Inability: Never true  . Transportation needs:    Medical: No    Non-medical: No  Tobacco Use  . Smoking status: Never Smoker  . Smokeless tobacco: Never Used  Substance and Sexual Activity  . Alcohol use: Yes    Alcohol/week: 0.0 standard drinks    Comment: pt states she drinks wine on occasion  . Drug use: No  . Sexual activity: Yes  Lifestyle  .  Physical activity:    Days per week: 0 days    Minutes per session: 0 min  . Stress: Not at all  Relationships  . Social connections:    Talks on phone: More than three times a week    Gets together: More than three times a week    Attends religious service: More than 4 times per year    Active member of club or organization: Yes    Attends meetings of clubs or organizations: More than 4 times per year    Relationship status: Married  . Intimate partner violence:    Fear of current or ex partner: No    Emotionally abused: No    Physically abused: No    Forced sexual activity: No  Other Topics Concern  . Not on file  Social History Narrative  . Not on file   Family History  Problem Relation Age of Onset  . Hypertension Mother   . Heart disease Mother        pacemaker and CHF  . Glaucoma Mother   . Hypertension Brother   . Arthritis Sister   . Heart disease Maternal Grandmother   . Heart disease Maternal Grandfather   . Hypertension Paternal Grandmother   . Stroke Paternal Grandmother   .  Cancer Paternal Grandfather     VITAL SIGNS BP (!) 113/58   Pulse 85   Temp 97.9 F (36.6 C)   Resp 18   Ht 5' 3" (1.6 m)   Wt 141 lb 3.2 oz (64 kg)   LMP  (LMP Unknown)   SpO2 98%   BMI 25.01 kg/m   Patient's Medications  New Prescriptions   No medications on file  Previous Medications   ACETAMINOPHEN (TYLENOL) 325 MG TABLET    Take 650 mg by mouth every 6 (six) hours.   ACETYLCYSTEINE (NAC) 600 MG CAPS    Take 600 mg by mouth 2 (two) times daily.   ALENDRONATE (FOSAMAX) 70 MG TABLET    Take 70 mg by mouth once a week. Takes on Monday.  Take with a full glass of water on an empty stomach.   ASPIRIN 325 MG TABLET    Take 325 mg by mouth daily.   BIOTIN 5 MG CAPS    Take 5 mcg by mouth daily.    CALCIUM CITRATE-VITAMIN D (CALCIUM + D PO)    Take 600 mg by mouth daily.   CHOLECALCIFEROL (VITAMIN D) 2000 UNITS CAPS    Take 2,000 Units by mouth daily.   CINNAMON PO    Take  1,000 mg by mouth daily.   CIPROFLOXACIN (CIPRO) 250 MG TABLET    Take 250 mg by mouth 2 (two) times daily.   CYCLOSPORINE (RESTASIS) 0.05 % OPHTHALMIC EMULSION    Place 1 drop into both eyes 2 (two) times daily.   DOCUSATE SODIUM (COLACE) 100 MG CAPSULE    Take 1 capsule (100 mg total) by mouth 2 (two) times daily.   HYDROXYCHLOROQUINE (PLAQUENIL) 200 MG TABLET    Take 400 mg by mouth daily.    L-LYSINE 500 MG TABS    Take 500 mg by mouth daily.   MULTIPLE VITAMIN (MULTIVITAMIN) TABLET    Take 1 tablet by mouth daily.   MYCOPHENOLATE (CELLCEPT) 500 MG TABLET    Take 2 tablets by mouth 2 (two) times daily.   NON FORMULARY    Diet Type:  Regular   OMEGA-3 FATTY ACIDS (FISH OIL) 1000 MG CAPS    Take 1,000 mg by mouth daily.   OMEPRAZOLE (PRILOSEC) 20 MG CAPSULE    Take 1 capsule (20 mg total) by mouth daily.   OXYCODONE (OXY IR/ROXICODONE) 5 MG IMMEDIATE RELEASE TABLET    Take 1 tablet (5 mg total) by mouth every 4 (four) hours as needed for moderate pain.   OXYGEN    2 L/min by Continuous infusion (non-IV) route daily.   POLYETHYLENE GLYCOL (MIRALAX / GLYCOLAX) PACKET    Take 17 g by mouth daily.   PREDNISONE (DELTASONE) 10 MG TABLET    Take 10 mg by mouth daily.   SENNA (SENOKOT) 8.6 MG TABS TABLET    Take 2 tablets by mouth 2 (two) times daily as needed for mild constipation.   SERTRALINE (ZOLOFT) 50 MG TABLET    Take 1 tablet (50 mg total) by mouth daily.   SULFAMETHOXAZOLE-TRIMETHOPRIM (BACTRIM DS,SEPTRA DS) 800-160 MG TABLET    Take by mouth. Give 1 tablet by mouth on Monday, Wednesday and Friday   ZOLPIDEM (AMBIEN) 10 MG TABLET    Take 0.5 tablets (5 mg total) by mouth at bedtime.  Modified Medications   No medications on file  Discontinued Medications   No medications on file     SIGNIFICANT DIAGNOSTIC EXAMS  LABS REVIEWED: PREVIOUS:  03-12-18: wbc 8.8; hgb 6.9; hct 22.4; mcv 92,9; plt 189;  03-13-18: wbc 9.8; hgb 8.4; hct 26.1; mcv 91.9; plt 194;  03-14-18: wbc 8.4; hgb 8.0;  hct 25.0; mcv 90.9; plt 242 03-18-18: wbc 10.3; hgb 8.7; hct 27.5; mcv 92.6; plt 362 03-19-18: urine culture: gram neg rod 03-20-18: wbc 9.1; hgb 8.8; hct 27.8; mcv 93.3; plt 406    NO NEW LABS.   Review of Systems  Constitutional: Negative for malaise/fatigue.  Respiratory: Negative for cough and shortness of breath.   Cardiovascular: Negative for chest pain, palpitations and leg swelling.  Gastrointestinal: Negative for abdominal pain, constipation and heartburn.  Musculoskeletal: Negative for back pain, joint pain and myalgias.  Skin: Negative.   Neurological: Negative for dizziness.  Psychiatric/Behavioral: The patient is not nervous/anxious.       Physical Exam  Constitutional: She is oriented to person, place, and time. She appears well-developed and well-nourished. No distress.  Thin   Neck: No thyromegaly present.  Cardiovascular: Normal rate, regular rhythm, normal heart sounds and intact distal pulses.  Pulmonary/Chest: Effort normal and breath sounds normal. No respiratory distress.  Abdominal: Soft. Bowel sounds are normal. She exhibits no distension. There is no tenderness.  Musculoskeletal: She exhibits no edema.  Is status post left intertrochanteric nailing Is able to move all extremities    Is using walker   Lymphadenopathy:    She has no cervical adenopathy.  Neurological: She is alert and oriented to person, place, and time.  Skin: Skin is warm and dry. She is not diaphoretic.  Psychiatric: She has a normal mood and affect.    ASSESSMENT/ PLAN:  Patient is being discharged with the following home health services:  Will use out patient PT  Patient is being discharged with the following durable medical equipment:  None needed   Patient has been advised to f/u with their PCP in 1-2 weeks to bring them up to date on their rehab stay.  Social services at facility was responsible for arranging this appointment.  Pt was provided with a 30 day supply of  prescriptions for medications and refills must be obtained from their PCP.  For controlled substances, a more limited supply may be provided adequate until PCP appointment only.   A 30 day supply of prescription medications have been sent electronically to [facility]   #15 oxycodone 5 mg tabs  #5 ambien 10 mg tabs.   Time spent with patient 35 minutes: discussed medications; dme and therapy; verbalized understanding.   Ok Edwards NP [facility]  Contact [phone] Monday through Friday 8am- 5pm  After hours call [phone]

## 2018-04-01 DIAGNOSIS — M25552 Pain in left hip: Secondary | ICD-10-CM | POA: Diagnosis not present

## 2018-04-01 DIAGNOSIS — R262 Difficulty in walking, not elsewhere classified: Secondary | ICD-10-CM | POA: Diagnosis not present

## 2018-04-04 DIAGNOSIS — M25552 Pain in left hip: Secondary | ICD-10-CM | POA: Diagnosis not present

## 2018-04-04 DIAGNOSIS — R262 Difficulty in walking, not elsewhere classified: Secondary | ICD-10-CM | POA: Diagnosis not present

## 2018-04-07 DIAGNOSIS — M25552 Pain in left hip: Secondary | ICD-10-CM | POA: Diagnosis not present

## 2018-04-07 DIAGNOSIS — R262 Difficulty in walking, not elsewhere classified: Secondary | ICD-10-CM | POA: Diagnosis not present

## 2018-04-09 DIAGNOSIS — R262 Difficulty in walking, not elsewhere classified: Secondary | ICD-10-CM | POA: Diagnosis not present

## 2018-04-09 DIAGNOSIS — M25552 Pain in left hip: Secondary | ICD-10-CM | POA: Diagnosis not present

## 2018-04-15 DIAGNOSIS — R262 Difficulty in walking, not elsewhere classified: Secondary | ICD-10-CM | POA: Diagnosis not present

## 2018-04-15 DIAGNOSIS — M25552 Pain in left hip: Secondary | ICD-10-CM | POA: Diagnosis not present

## 2018-04-18 DIAGNOSIS — R262 Difficulty in walking, not elsewhere classified: Secondary | ICD-10-CM | POA: Diagnosis not present

## 2018-04-18 DIAGNOSIS — M25552 Pain in left hip: Secondary | ICD-10-CM | POA: Diagnosis not present

## 2018-04-21 DIAGNOSIS — R262 Difficulty in walking, not elsewhere classified: Secondary | ICD-10-CM | POA: Diagnosis not present

## 2018-04-21 DIAGNOSIS — M25552 Pain in left hip: Secondary | ICD-10-CM | POA: Diagnosis not present

## 2018-04-23 DIAGNOSIS — M25552 Pain in left hip: Secondary | ICD-10-CM | POA: Diagnosis not present

## 2018-04-23 DIAGNOSIS — R262 Difficulty in walking, not elsewhere classified: Secondary | ICD-10-CM | POA: Diagnosis not present

## 2018-04-24 DIAGNOSIS — S72002A Fracture of unspecified part of neck of left femur, initial encounter for closed fracture: Secondary | ICD-10-CM | POA: Diagnosis not present

## 2018-04-24 DIAGNOSIS — M542 Cervicalgia: Secondary | ICD-10-CM | POA: Diagnosis not present

## 2018-04-30 DIAGNOSIS — R262 Difficulty in walking, not elsewhere classified: Secondary | ICD-10-CM | POA: Diagnosis not present

## 2018-04-30 DIAGNOSIS — M25552 Pain in left hip: Secondary | ICD-10-CM | POA: Diagnosis not present

## 2018-05-02 DIAGNOSIS — M25552 Pain in left hip: Secondary | ICD-10-CM | POA: Diagnosis not present

## 2018-05-02 DIAGNOSIS — R262 Difficulty in walking, not elsewhere classified: Secondary | ICD-10-CM | POA: Diagnosis not present

## 2018-05-02 DIAGNOSIS — H0011 Chalazion right upper eyelid: Secondary | ICD-10-CM | POA: Diagnosis not present

## 2018-05-05 DIAGNOSIS — M25552 Pain in left hip: Secondary | ICD-10-CM | POA: Diagnosis not present

## 2018-05-05 DIAGNOSIS — R262 Difficulty in walking, not elsewhere classified: Secondary | ICD-10-CM | POA: Diagnosis not present

## 2018-05-07 DIAGNOSIS — R262 Difficulty in walking, not elsewhere classified: Secondary | ICD-10-CM | POA: Diagnosis not present

## 2018-05-07 DIAGNOSIS — M25552 Pain in left hip: Secondary | ICD-10-CM | POA: Diagnosis not present

## 2018-05-12 DIAGNOSIS — M25552 Pain in left hip: Secondary | ICD-10-CM | POA: Diagnosis not present

## 2018-05-12 DIAGNOSIS — R262 Difficulty in walking, not elsewhere classified: Secondary | ICD-10-CM | POA: Diagnosis not present

## 2018-05-19 DIAGNOSIS — M25552 Pain in left hip: Secondary | ICD-10-CM | POA: Diagnosis not present

## 2018-05-19 DIAGNOSIS — R262 Difficulty in walking, not elsewhere classified: Secondary | ICD-10-CM | POA: Diagnosis not present

## 2018-05-26 DIAGNOSIS — M25552 Pain in left hip: Secondary | ICD-10-CM | POA: Diagnosis not present

## 2018-05-26 DIAGNOSIS — R262 Difficulty in walking, not elsewhere classified: Secondary | ICD-10-CM | POA: Diagnosis not present

## 2018-05-28 DIAGNOSIS — R262 Difficulty in walking, not elsewhere classified: Secondary | ICD-10-CM | POA: Diagnosis not present

## 2018-05-28 DIAGNOSIS — M25552 Pain in left hip: Secondary | ICD-10-CM | POA: Diagnosis not present

## 2018-05-29 DIAGNOSIS — J8410 Pulmonary fibrosis, unspecified: Secondary | ICD-10-CM | POA: Diagnosis not present

## 2018-05-29 DIAGNOSIS — M359 Systemic involvement of connective tissue, unspecified: Secondary | ICD-10-CM | POA: Diagnosis not present

## 2018-05-29 DIAGNOSIS — J8417 Other interstitial pulmonary diseases with fibrosis in diseases classified elsewhere: Secondary | ICD-10-CM | POA: Diagnosis not present

## 2018-05-29 DIAGNOSIS — J984 Other disorders of lung: Secondary | ICD-10-CM | POA: Diagnosis not present

## 2018-05-29 DIAGNOSIS — J9612 Chronic respiratory failure with hypercapnia: Secondary | ICD-10-CM | POA: Diagnosis not present

## 2018-05-29 DIAGNOSIS — Z79899 Other long term (current) drug therapy: Secondary | ICD-10-CM | POA: Diagnosis not present

## 2018-05-29 DIAGNOSIS — M368 Systemic disorders of connective tissue in other diseases classified elsewhere: Secondary | ICD-10-CM | POA: Diagnosis not present

## 2018-05-29 DIAGNOSIS — R0602 Shortness of breath: Secondary | ICD-10-CM | POA: Diagnosis not present

## 2018-06-02 DIAGNOSIS — M25552 Pain in left hip: Secondary | ICD-10-CM | POA: Diagnosis not present

## 2018-06-02 DIAGNOSIS — R262 Difficulty in walking, not elsewhere classified: Secondary | ICD-10-CM | POA: Diagnosis not present

## 2018-06-04 DIAGNOSIS — R262 Difficulty in walking, not elsewhere classified: Secondary | ICD-10-CM | POA: Diagnosis not present

## 2018-06-04 DIAGNOSIS — M25552 Pain in left hip: Secondary | ICD-10-CM | POA: Diagnosis not present

## 2018-06-09 DIAGNOSIS — M25552 Pain in left hip: Secondary | ICD-10-CM | POA: Diagnosis not present

## 2018-06-09 DIAGNOSIS — R262 Difficulty in walking, not elsewhere classified: Secondary | ICD-10-CM | POA: Diagnosis not present

## 2018-06-11 DIAGNOSIS — R262 Difficulty in walking, not elsewhere classified: Secondary | ICD-10-CM | POA: Diagnosis not present

## 2018-06-11 DIAGNOSIS — M25552 Pain in left hip: Secondary | ICD-10-CM | POA: Diagnosis not present

## 2018-06-16 DIAGNOSIS — M25552 Pain in left hip: Secondary | ICD-10-CM | POA: Diagnosis not present

## 2018-06-16 DIAGNOSIS — R262 Difficulty in walking, not elsewhere classified: Secondary | ICD-10-CM | POA: Diagnosis not present

## 2018-06-18 DIAGNOSIS — M25552 Pain in left hip: Secondary | ICD-10-CM | POA: Diagnosis not present

## 2018-06-18 DIAGNOSIS — R262 Difficulty in walking, not elsewhere classified: Secondary | ICD-10-CM | POA: Diagnosis not present

## 2018-06-23 DIAGNOSIS — R262 Difficulty in walking, not elsewhere classified: Secondary | ICD-10-CM | POA: Diagnosis not present

## 2018-06-23 DIAGNOSIS — M25552 Pain in left hip: Secondary | ICD-10-CM | POA: Diagnosis not present

## 2018-06-24 DIAGNOSIS — S72002D Fracture of unspecified part of neck of left femur, subsequent encounter for closed fracture with routine healing: Secondary | ICD-10-CM | POA: Diagnosis not present

## 2018-06-25 DIAGNOSIS — R262 Difficulty in walking, not elsewhere classified: Secondary | ICD-10-CM | POA: Diagnosis not present

## 2018-06-25 DIAGNOSIS — M25552 Pain in left hip: Secondary | ICD-10-CM | POA: Diagnosis not present

## 2018-06-25 DIAGNOSIS — Z961 Presence of intraocular lens: Secondary | ICD-10-CM | POA: Diagnosis not present

## 2018-06-30 DIAGNOSIS — R262 Difficulty in walking, not elsewhere classified: Secondary | ICD-10-CM | POA: Diagnosis not present

## 2018-06-30 DIAGNOSIS — M25552 Pain in left hip: Secondary | ICD-10-CM | POA: Diagnosis not present

## 2018-07-09 DIAGNOSIS — M25552 Pain in left hip: Secondary | ICD-10-CM | POA: Diagnosis not present

## 2018-07-09 DIAGNOSIS — R262 Difficulty in walking, not elsewhere classified: Secondary | ICD-10-CM | POA: Diagnosis not present

## 2018-07-11 DIAGNOSIS — M25552 Pain in left hip: Secondary | ICD-10-CM | POA: Diagnosis not present

## 2018-07-11 DIAGNOSIS — R262 Difficulty in walking, not elsewhere classified: Secondary | ICD-10-CM | POA: Diagnosis not present

## 2018-07-14 DIAGNOSIS — M25552 Pain in left hip: Secondary | ICD-10-CM | POA: Diagnosis not present

## 2018-07-14 DIAGNOSIS — R262 Difficulty in walking, not elsewhere classified: Secondary | ICD-10-CM | POA: Diagnosis not present

## 2018-07-16 DIAGNOSIS — M25552 Pain in left hip: Secondary | ICD-10-CM | POA: Diagnosis not present

## 2018-07-16 DIAGNOSIS — R262 Difficulty in walking, not elsewhere classified: Secondary | ICD-10-CM | POA: Diagnosis not present

## 2018-07-21 ENCOUNTER — Ambulatory Visit (INDEPENDENT_AMBULATORY_CARE_PROVIDER_SITE_OTHER): Payer: Medicare Other

## 2018-07-21 VITALS — BP 108/64 | HR 69 | Temp 98.0°F | Resp 16 | Ht 63.0 in | Wt 134.4 lb

## 2018-07-21 DIAGNOSIS — Z780 Asymptomatic menopausal state: Secondary | ICD-10-CM | POA: Diagnosis not present

## 2018-07-21 DIAGNOSIS — M8580 Other specified disorders of bone density and structure, unspecified site: Secondary | ICD-10-CM

## 2018-07-21 DIAGNOSIS — Z0000 Encounter for general adult medical examination without abnormal findings: Secondary | ICD-10-CM | POA: Diagnosis not present

## 2018-07-21 DIAGNOSIS — M25552 Pain in left hip: Secondary | ICD-10-CM | POA: Diagnosis not present

## 2018-07-21 DIAGNOSIS — R262 Difficulty in walking, not elsewhere classified: Secondary | ICD-10-CM | POA: Diagnosis not present

## 2018-07-21 DIAGNOSIS — Z1239 Encounter for other screening for malignant neoplasm of breast: Secondary | ICD-10-CM | POA: Diagnosis not present

## 2018-07-21 NOTE — Progress Notes (Signed)
Subjective:   Melanie Rollins is a 73 y.o. female who presents for Medicare Annual (Subsequent) preventive examination.  Review of Systems:   Cardiac Risk Factors include: advanced age (>11mn, >>56women)     Objective:     Vitals: BP 108/64 (BP Location: Left Arm, Patient Position: Sitting, Cuff Size: Normal)   Pulse 69   Temp 98 F (36.7 C) (Temporal)   Resp 16   Ht _0  (1.6 m)   Wt 134 lb 6.4 oz (61 kg)   LMP  (LMP Unknown)   BMI 23.81 kg/m   Body mass index is 23.81 kg/m.  Advanced Directives 07/21/2018 03/25/2018 03/21/2018 03/10/2018 03/10/2018 03/09/2018 07/15/2017  Does Patient Have a Medical Advance Directive? Yes No No Yes Yes Yes Yes  Type of Advance Directive Living will;Healthcare Power of Attorney - - - Living will;Healthcare Power of Attorney Living will;Healthcare Power of AReedyLiving will  Does patient want to make changes to medical advance directive? - No - Patient declined No - Patient declined - No - Patient declined No - Patient declined -  Copy of [place] Chart? Yes - validated most recent copy scanned in chart (See row information) - - - No - copy requested No - copy requested Yes  Would patient like information on creating a medical advance directive? - No - Patient declined No - Patient declined - No - Patient declined No - Patient declined -    Tobacco Social History   Tobacco Use  Smoking Status Never Smoker  Smokeless Tobacco Never Used     Counseling given: Not Answered   Clinical Intake:  Pre-visit preparation completed: Yes  Pain : 0-10 Pain Score: 1  Pain Type: Chronic pain Pain Location: Leg Pain Orientation: Left Pain Descriptors / Indicators: Aching Pain Onset: More than a month ago Pain Frequency: Constant     Nutritional Status: BMI of 19-24  Normal Nutritional Risks: None Diabetes: No  How often do you need to have someone help you when you read instructions,  pamphlets, or other written materials from your doctor or pharmacy?: 1 - Never What is the last grade level you completed in school?: associates degree   Interpreter Needed?: No  Information entered by :: Juliya Magill,LPN   Past Medical History:  Diagnosis Date  . Arthritis    RA  . Hip fracture requiring operative repair ([facility]    left hip   . Hyperlipidemia   . [place]   . [facility] syndrome ([facility]    Past Surgical History:  Procedure Laterality Date  . INTRAMEDULLARY (IM) NAIL INTERTROCHANTERIC Left 03/10/2018   Procedure: INTRAMEDULLARY (IM) NAIL INTERTROCHANTRIC-LEFT TFNA;  Surgeon: BLovell Sheehan MD;  Location: [facility] ORS;  Service: Orthopedics;  Laterality: Left;  . kidney stones    . LUNG BIOPSY     Family History  Problem Relation Age of Onset  . Hypertension Mother   . Heart disease Mother        pacemaker and CHF  . Glaucoma Mother   . Hypertension Brother   . Arthritis Sister   . Heart disease Maternal Grandmother   . Heart disease Maternal Grandfather   . Hypertension Paternal Grandmother   . Stroke Paternal Grandmother   . Cancer Paternal Grandfather    Social History   Socioeconomic History  . Marital status: Married    Spouse name: Not on file  . Number of children: Not on file  . Years  of education: Not on file  . Highest education level: Associate degree: academic program  Occupational History  . Occupation: part time clerical work   Social Needs  . Financial resource strain: Not hard at all  . Food insecurity:    Worry: Never true    Inability: Never true  . Transportation needs:    Medical: No    Non-medical: No  Tobacco Use  . Smoking status: Never Smoker  . Smokeless tobacco: Never Used  Substance and Sexual Activity  . Alcohol use: Yes    Alcohol/week: 0.0 standard drinks    Comment: pt states she drinks wine on occasion  . Drug use: No  . Sexual activity: Yes  Lifestyle  . Physical activity:    Days per week: 0  days    Minutes per session: 0 min  . Stress: Not at all  Relationships  . Social connections:    Talks on phone: More than three times a week    Gets together: More than three times a week    Attends religious service: More than 4 times per year    Active member of club or organization: Yes    Attends meetings of clubs or organizations: More than 4 times per year    Relationship status: Married  Other Topics Concern  . Not on file  Social History Narrative   In investment club   Working part time   In lady sorority meet once a month   Stitching group meets couple times a month    Attends church     Outpatient Encounter Medications as of 07/21/2018  Medication Sig  . acetaminophen (TYLENOL) 325 MG tablet Take 650 mg by mouth every 6 (six) hours.  . Acetylcysteine (NAC) 600 MG CAPS Take 1 capsule (600 mg total) by mouth 2 (two) times daily.  Marland Kitchen alendronate (FOSAMAX) 70 MG tablet Take 1 tablet (70 mg total) by mouth once a week. Takes on Monday.  Take with a full glass of water on an empty stomach.  . Biotin 5 MG CAPS Take 5 mcg by mouth daily.   . Calcium Citrate-Vitamin D (CALCIUM + D PO) Take 600 mg by mouth daily.  . Cholecalciferol (VITAMIN D) 2000 units CAPS Take 2,000 Units by mouth daily.  Marland Kitchen CINNAMON PO Take 1,000 mg by mouth daily.  Marland Kitchen docusate sodium (COLACE) 100 MG capsule Take 1 capsule (100 mg total) by mouth 2 (two) times daily.  . hydroxychloroquine (PLAQUENIL) 200 MG tablet Take 2 tablets (400 mg total) by mouth daily.  Marland Kitchen L-Lysine 500 MG TABS Take 500 mg by mouth daily.  . Multiple Vitamin (MULTIVITAMIN) tablet Take 1 tablet by mouth daily.  . mycophenolate (CELLCEPT) 500 MG tablet Take 2 tablets (1,000 mg total) by mouth 2 (two) times daily.  . Omega-3 Fatty Acids (FISH OIL) 1000 MG CAPS Take 1,000 mg by mouth daily.  Marland Kitchen omeprazole (PRILOSEC) 20 MG capsule Take 1 capsule (20 mg total) by mouth daily.  . OXYGEN 2 L/min by Continuous infusion (non-IV) route daily.  .  predniSONE (DELTASONE) 10 MG tablet Take 1 tablet (10 mg total) by mouth daily.  . sertraline (ZOLOFT) 50 MG tablet Take 1 tablet (50 mg total) by mouth daily.  Marland Kitchen sulfamethoxazole-trimethoprim (BACTRIM DS,SEPTRA DS) 800-160 MG tablet Take 1 tablet by mouth 3 (three) times a week. Give 1 tablet by mouth on Monday, Wednesday and Friday  . zolpidem (AMBIEN) 10 MG tablet Take 0.5 tablets (5 mg total) by mouth at  bedtime.  . polyethylene glycol (MIRALAX / GLYCOLAX) packet Take 17 g by mouth daily. (Patient not taking: Reported on 07/21/2018)  . senna (SENOKOT) 8.6 MG TABS tablet Take 2 tablets by mouth 2 (two) times daily as needed for mild constipation.  . [DISCONTINUED] aspirin 325 MG tablet Take 325 mg by mouth daily.  . [DISCONTINUED] ciprofloxacin (CIPRO) 250 MG tablet Take 250 mg by mouth 2 (two) times daily.  . [DISCONTINUED] cycloSPORINE (RESTASIS) 0.05 % ophthalmic emulsion Place 1 drop into both eyes 2 (two) times daily. (Patient not taking: Reported on 07/21/2018)  . [DISCONTINUED] NON FORMULARY Diet Type:  Regular  . [DISCONTINUED] oxyCODONE (OXY IR/ROXICODONE) 5 MG immediate release tablet Take 1 tablet (5 mg total) by mouth every 4 (four) hours as needed for moderate pain. (Patient not taking: Reported on 07/21/2018)   No facility-administered encounter medications on file as of 07/21/2018.     Activities of Daily Living In your present state of health, do you have any difficulty performing the following activities: 07/21/2018 03/10/2018  Hearing? N N  Comment left ear difficulty, no hearing aids -  Vision? N N  Difficulty concentrating or making decisions? N N  Walking or climbing stairs? Y N  Comment hip pain  -  Dressing or bathing? N N  Doing errands, shopping? Y N  Comment some, due to hip  -  Preparing Food and eating ? N -  Using the Toilet? N -  In the past six months, have you accidently leaked urine? N -  Do you have problems with loss of bowel control? Y -  Comment has had  some accidents, wears pads as needed  -  Managing your Medications? N -  Managing your Finances? N -  Housekeeping or managing your Housekeeping? N -  Some recent data might be hidden    Patient Care Team: Valerie Roys, DO as PCP - General (Family Medicine) Emmaline Kluver., MD (Rheumatology) Charlie Pitter, MD as Referring Physician (Pulmonary Disease) Teodoro Spray, MD as Consulting Physician (Cardiology) Lovell Sheehan, MD as Consulting Physician (Orthopedic Surgery) Jannet Mantis, MD (Dermatology)    Assessment:   This is a routine wellness examination for [place].  Exercise Activities and Dietary recommendations Current Exercise Habits: Structured exercise class(PT twice a week ), Time (Minutes): 60, Frequency (Times/Week): 2, Weekly Exercise (Minutes/Week): 120, Intensity: Mild, Exercise limited by: orthopedic condition(s)  Goals   None     Fall Risk Fall Risk  07/21/2018 01/13/2018 07/15/2017 07/06/2016 02/06/2016  Falls in the past year? 1 No No No No  Number falls in past yr: 0 - - - -  Injury with Fall? 1 - - - -   FALL RISK PREVENTION PERTAINING TO THE HOME:  Any stairs in or around the home? Yes  If so, are there any without handrails? No   Home free of loose throw rugs in walkways, pet beds, electrical cords, etc? Yes  Adequate lighting in your home to reduce risk of falls? Yes   ASSISTIVE DEVICES UTILIZED TO PREVENT FALLS:  Life alert? No  Use of a cane, walker or w/c? No  Grab bars in the bathroom? Yes  Shower chair or bench in shower? No  Elevated toilet seat or a handicapped toilet? No   DME ORDERS:  DME order needed?  No   TIMED UP AND GO:  Was the test performed? Yes .  Length of time to ambulate 10 feet: 11 sec.   GAIT:  Appearance of gait: Gait stead-fast without the use of an assistive device.  Education: Fall risk prevention has been discussed.  Intervention(s) required? No    Depression Screen PHQ 2/9  Scores 07/21/2018 01/13/2018 10/28/2017 10/10/2017  PHQ - 2 Score 0 0 0 0  PHQ- 9 Score - _0 Cognitive Function     6CIT Screen 07/21/2018 07/15/2017  What Year? 0 points 0 points  What month? 0 points 0 points  What time? 0 points 0 points  Count back from 20 0 points 0 points  Months in reverse 0 points 0 points  Repeat phrase 0 points 0 points  Total Score 0 0    Immunization History  Administered Date(s) Administered  . Influenza, High Dose Seasonal PF 01/19/2018  . Influenza-Unspecified 02/17/2015, 03/09/2016, 02/07/2017  . Pneumococcal Conjugate-13 07/02/2014  . Pneumococcal Polysaccharide-23 07/04/2015  . Td 03/16/2009  . Zoster 07/06/2016  . Zoster Recombinat (Shingrix) 07/11/2016, 10/29/2016    Qualifies for Shingles Vaccine?completed shingrix series   Tdap: up to date   Flu Vaccine: up to date   Pneumococcal Vaccine: up to date   Screening Tests Health Maintenance  Topic Date Due  . INFLUENZA VACCINE  12/19/2017  . TETANUS/TDAP  03/17/2019  . COLONOSCOPY  08/18/2019  . MAMMOGRAM  08/27/2019  . DEXA SCAN  Completed  . Hepatitis C Screening  Completed  . PNA vac Low Risk Adult  Completed    Cancer Screenings:  Colorectal Screening: Completed 08/07/2009. Repeat every 10 years  Mammogram: Completed 08/26/2017. Repeat every year. Order placed, ABN signed as it stated it wouldnt be covered, patient understands to call to check on insurance coverage   Bone Density: Completed 08/16/2016, ordered pateint has osteopenia and taking fosamax   Lung Cancer Screening: (Low Dose CT Chest recommended if Age 80-80 years, 30 pack-year currently smoking OR have quit w/in 15years.) does not qualify.    Additional Screening:  Hepatitis C Screening: does qualify; Completed 07/15/2017  Vision Screening: Recommended annual ophthalmology exams for early detection of glaucoma and other disorders of the eye. Is the patient up to date with their annual eye exam?  Yes  Who is  the provider or what is the name of the office in which the pt attends annual eye exams? Dr.Jackson   Dental Screening: Recommended annual dental exams for proper oral hygiene  Community Resource Referral:  CRR required this visit?  No      Plan:    I have personally reviewed and addressed the Medicare Annual Wellness questionnaire and have noted the following in the patient's chart:  A. Medical and social history B. Use of alcohol, tobacco or illicit drugs  C. Current medications and supplements D. Functional ability and status E.  Nutritional status F.  Physical activity G. Advance directives H. List of other physicians I.  Hospitalizations, surgeries, and ER visits in previous 12 months J.  [place] such as hearing and vision if needed, cognitive and depression L. Referrals and appointments   In addition, I have reviewed and discussed with patient certain preventive protocols, quality metrics, and best practice recommendations. A written personalized care plan for preventive services as well as general preventive health recommendations were provided to patient.   Signed,  Tyler Aas, LPN Nurse Health Advisor   Nurse Notes:none

## 2018-07-21 NOTE — Patient Instructions (Addendum)
Melanie Rollins , Thank you for taking time to come for your Medicare Wellness Visit. I appreciate your ongoing commitment to your health goals. Please review the following plan we discussed and let me know if I can assist you in the future.   Screening recommendations/referrals: Colonoscopy: completed 08/07/2009 Mammogram: completed 08/26/2017 Please call [phone] to schedule your mammogram.  Bone Density: completed 08/16/2016 please schedule with mammogram Recommended yearly ophthalmology/optometry visit for glaucoma screening and checkup Recommended yearly dental visit for hygiene and checkup  Vaccinations: Influenza vaccine: up to date Pneumococcal vaccine: up to date  Tdap vaccine: up to date Shingles vaccine: shingrix series completed   Advanced directives: copy on file  Conditions/risks identified: fall prevention- hip repair in October   Next appointment: Follow up in one year for your annual wellness exam.    Preventive Care 19 Years and Older, Female Preventive care refers to lifestyle choices and visits with your health care provider that can promote health and wellness. What does preventive care include?  A yearly physical exam. This is also called an annual well check.  Dental exams once or twice a year.  Routine eye exams. Ask your health care provider how often you should have your eyes checked.  Personal lifestyle choices, including:  Daily care of your teeth and gums.  Regular physical activity.  Eating a healthy diet.  Avoiding tobacco and drug use.  Limiting alcohol use.  Practicing safe sex.  Taking low-dose aspirin every day.  Taking vitamin and mineral supplements as recommended by your health care provider. What happens during an annual well check? The services and screenings done by your health care provider during your annual well check will depend on your age, overall health, lifestyle risk factors, and family history of disease. Counseling    Your health care provider may ask you questions about your:  Alcohol use.  Tobacco use.  Drug use.  Emotional well-being.  Home and relationship well-being.  Sexual activity.  Eating habits.  History of falls.  Memory and ability to understand (cognition).  Work and work Statistician.  Reproductive health. Screening  You may have the following tests or measurements:  Height, weight, and BMI.  Blood pressure.  Lipid and cholesterol levels. These may be checked every 5 years, or more frequently if you are over 81 years old.  Skin check.  Lung cancer screening. You may have this screening every year starting at age 52 if you have a 30-pack-year history of smoking and currently smoke or have quit within the past 15 years.  Fecal occult blood test (FOBT) of the stool. You may have this test every year starting at age 3.  Flexible sigmoidoscopy or colonoscopy. You may have a sigmoidoscopy every 5 years or a colonoscopy every 10 years starting at age 74.  Hepatitis C blood test.  Hepatitis B blood test.  Sexually transmitted disease (STD) testing.  Diabetes screening. This is done by checking your blood sugar (glucose) after you have not eaten for a while (fasting). You may have this done every 1-3 years.  Bone density scan. This is done to screen for osteoporosis. You may have this done starting at age 64.  Mammogram. This may be done every 1-2 years. Talk to your health care provider about how often you should have regular mammograms. Talk with your health care provider about your test results, treatment options, and if necessary, the need for more tests. Vaccines  Your health care provider may recommend certain vaccines, such as:  Influenza vaccine. This is recommended every year.  Tetanus, diphtheria, and acellular pertussis (Tdap, Td) vaccine. You may need a Td booster every 10 years.  Zoster vaccine. You may need this after age 65.  Pneumococcal 13-valent  conjugate (PCV13) vaccine. One dose is recommended after age 45.  Pneumococcal polysaccharide (PPSV23) vaccine. One dose is recommended after age 70. Talk to your health care provider about which screenings and vaccines you need and how often you need them. This information is not intended to replace advice given to you by your health care provider. Make sure you discuss any questions you have with your health care provider. Document Released: 06/03/2015 Document Revised: 01/25/2016 Document Reviewed: 03/08/2015 Elsevier Interactive Patient Education  2017 [place] Prevention in the Home Falls can cause injuries. They can happen to people of all ages. There are many things you can do to make your home safe and to help prevent falls. What can I do on the outside of my home?  Regularly fix the edges of walkways and driveways and fix any cracks.  Remove anything that might make you trip as you walk through a door, such as a raised step or threshold.  Trim any bushes or trees on the path to your home.  Use bright outdoor lighting.  Clear any walking paths of anything that might make someone trip, such as rocks or tools.  Regularly check to see if handrails are loose or broken. Make sure that both sides of any steps have handrails.  Any raised decks and porches should have guardrails on the edges.  Have any leaves, snow, or ice cleared regularly.  Use sand or salt on walking paths during winter.  Clean up any spills in your garage right away. This includes oil or grease spills. What can I do in the bathroom?  Use night lights.  Install grab bars by the toilet and in the tub and shower. Do not use towel bars as grab bars.  Use non-skid mats or decals in the tub or shower.  If you need to sit down in the shower, use a plastic, non-slip stool.  Keep the floor dry. Clean up any water that spills on the floor as soon as it happens.  Remove soap buildup in the tub or  shower regularly.  Attach bath mats securely with double-sided non-slip rug tape.  Do not have throw rugs and other things on the floor that can make you trip. What can I do in the bedroom?  Use night lights.  Make sure that you have a light by your bed that is easy to reach.  Do not use any sheets or blankets that are too big for your bed. They should not hang down onto the floor.  Have a firm chair that has side arms. You can use this for support while you get dressed.  Do not have throw rugs and other things on the floor that can make you trip. What can I do in the kitchen?  Clean up any spills right away.  Avoid walking on wet floors.  Keep items that you use a lot in easy-to-reach places.  If you need to reach something above you, use a strong step stool that has a grab bar.  Keep electrical cords out of the way.  Do not use floor polish or wax that makes floors slippery. If you must use wax, use non-skid floor wax.  Do not have throw rugs and other things on the floor that  can make you trip. What can I do with my stairs?  Do not leave any items on the stairs.  Make sure that there are handrails on both sides of the stairs and use them. Fix handrails that are broken or loose. Make sure that handrails are as long as the stairways.  Check any carpeting to make sure that it is firmly attached to the stairs. Fix any carpet that is loose or worn.  Avoid having throw rugs at the top or bottom of the stairs. If you do have throw rugs, attach them to the floor with carpet tape.  Make sure that you have a light switch at the top of the stairs and the bottom of the stairs. If you do not have them, ask someone to add them for you. What else can I do to help prevent falls?  Wear shoes that:  Do not have high heels.  Have rubber bottoms.  Are comfortable and fit you well.  Are closed at the toe. Do not wear sandals.  If you use a stepladder:  Make sure that it is fully  opened. Do not climb a closed stepladder.  Make sure that both sides of the stepladder are locked into place.  Ask someone to hold it for you, if possible.  Clearly mark and make sure that you can see:  Any grab bars or handrails.  First and last steps.  Where the edge of each step is.  Use tools that help you move around (mobility aids) if they are needed. These include:  Canes.  Walkers.  Scooters.  Crutches.  Turn on the lights when you go into a dark area. Replace any light bulbs as soon as they burn out.  Set up your furniture so you have a clear path. Avoid moving your furniture around.  If any of your floors are uneven, fix them.  If there are any pets around you, be aware of where they are.  Review your medicines with your doctor. Some medicines can make you feel dizzy. This can increase your chance of falling. Ask your doctor what other things that you can do to help prevent falls. This information is not intended to replace advice given to you by your health care provider. Make sure you discuss any questions you have with your health care provider. Document Released: 03/03/2009 Document Revised: 10/13/2015 Document Reviewed: 06/11/2014 Elsevier Interactive Patient Education  2017 [employer].

## 2018-07-23 DIAGNOSIS — M8080XD Other osteoporosis with current pathological fracture, unspecified site, subsequent encounter for fracture with routine healing: Secondary | ICD-10-CM | POA: Diagnosis not present

## 2018-07-23 DIAGNOSIS — M359 Systemic involvement of connective tissue, unspecified: Secondary | ICD-10-CM | POA: Diagnosis not present

## 2018-07-23 DIAGNOSIS — M25552 Pain in left hip: Secondary | ICD-10-CM | POA: Diagnosis not present

## 2018-07-23 DIAGNOSIS — R262 Difficulty in walking, not elsewhere classified: Secondary | ICD-10-CM | POA: Diagnosis not present

## 2018-07-23 DIAGNOSIS — J849 Interstitial pulmonary disease, unspecified: Secondary | ICD-10-CM | POA: Diagnosis not present

## 2018-07-23 DIAGNOSIS — I7300 Raynaud's syndrome without gangrene: Secondary | ICD-10-CM | POA: Diagnosis not present

## 2018-07-24 ENCOUNTER — Other Ambulatory Visit: Payer: Self-pay | Admitting: Family Medicine

## 2018-07-24 DIAGNOSIS — Z1231 Encounter for screening mammogram for malignant neoplasm of breast: Secondary | ICD-10-CM

## 2018-07-29 DIAGNOSIS — R262 Difficulty in walking, not elsewhere classified: Secondary | ICD-10-CM | POA: Diagnosis not present

## 2018-07-29 DIAGNOSIS — M25552 Pain in left hip: Secondary | ICD-10-CM | POA: Diagnosis not present

## 2018-07-31 DIAGNOSIS — R262 Difficulty in walking, not elsewhere classified: Secondary | ICD-10-CM | POA: Diagnosis not present

## 2018-07-31 DIAGNOSIS — M25552 Pain in left hip: Secondary | ICD-10-CM | POA: Diagnosis not present

## 2018-08-04 ENCOUNTER — Encounter: Payer: Self-pay | Admitting: Family Medicine

## 2018-08-04 ENCOUNTER — Ambulatory Visit (INDEPENDENT_AMBULATORY_CARE_PROVIDER_SITE_OTHER): Payer: Medicare Other | Admitting: Family Medicine

## 2018-08-04 ENCOUNTER — Other Ambulatory Visit: Payer: Self-pay

## 2018-08-04 VITALS — BP 118/62 | HR 91 | Temp 98.5°F

## 2018-08-04 DIAGNOSIS — F419 Anxiety disorder, unspecified: Secondary | ICD-10-CM

## 2018-08-04 DIAGNOSIS — J984 Other disorders of lung: Secondary | ICD-10-CM | POA: Diagnosis not present

## 2018-08-04 DIAGNOSIS — M358 Other specified systemic involvement of connective tissue: Secondary | ICD-10-CM | POA: Diagnosis not present

## 2018-08-04 DIAGNOSIS — R748 Abnormal levels of other serum enzymes: Secondary | ICD-10-CM | POA: Diagnosis not present

## 2018-08-04 DIAGNOSIS — E782 Mixed hyperlipidemia: Secondary | ICD-10-CM

## 2018-08-04 DIAGNOSIS — F329 Major depressive disorder, single episode, unspecified: Secondary | ICD-10-CM | POA: Diagnosis not present

## 2018-08-04 DIAGNOSIS — R8281 Pyuria: Secondary | ICD-10-CM

## 2018-08-04 DIAGNOSIS — F5101 Primary insomnia: Secondary | ICD-10-CM | POA: Diagnosis not present

## 2018-08-04 DIAGNOSIS — M359 Systemic involvement of connective tissue, unspecified: Secondary | ICD-10-CM

## 2018-08-04 DIAGNOSIS — M368 Systemic disorders of connective tissue in other diseases classified elsewhere: Secondary | ICD-10-CM

## 2018-08-04 DIAGNOSIS — K219 Gastro-esophageal reflux disease without esophagitis: Secondary | ICD-10-CM | POA: Diagnosis not present

## 2018-08-04 DIAGNOSIS — J8410 Pulmonary fibrosis, unspecified: Secondary | ICD-10-CM | POA: Diagnosis not present

## 2018-08-04 DIAGNOSIS — M25552 Pain in left hip: Secondary | ICD-10-CM | POA: Diagnosis not present

## 2018-08-04 DIAGNOSIS — L940 Localized scleroderma [morphea]: Secondary | ICD-10-CM

## 2018-08-04 DIAGNOSIS — F32A Depression, unspecified: Secondary | ICD-10-CM

## 2018-08-04 DIAGNOSIS — R8290 Unspecified abnormal findings in urine: Secondary | ICD-10-CM | POA: Diagnosis not present

## 2018-08-04 DIAGNOSIS — R262 Difficulty in walking, not elsewhere classified: Secondary | ICD-10-CM | POA: Diagnosis not present

## 2018-08-04 MED ORDER — SERTRALINE HCL 50 MG PO TABS: 50.0000 mg | ORAL_TABLET | Freq: Every day | ORAL | 1 refills | Status: DC

## 2018-08-04 MED ORDER — OMEPRAZOLE 20 MG PO CPDR: 20.0000 mg | DELAYED_RELEASE_CAPSULE | Freq: Every day | ORAL | 1 refills | Status: DC

## 2018-08-04 MED ORDER — ZOLPIDEM TARTRATE 10 MG PO TABS: 5.0000 mg | ORAL_TABLET | Freq: Every day | ORAL | 0 refills | Status: DC

## 2018-08-04 NOTE — Assessment & Plan Note (Signed)
No belly pain at this time. Call with any concerns. Continue to monitor.

## 2018-08-04 NOTE — Assessment & Plan Note (Signed)
Rechecking levels today. Await results. Call with any concerns.  

## 2018-08-04 NOTE — Assessment & Plan Note (Signed)
Uses her Lorrin Mais very sparingly. 30 pills given today- should last 1 year.

## 2018-08-04 NOTE — Assessment & Plan Note (Signed)
Under good control on current regimen. Continue current regimen. Continue to monitor. Call with any concerns. Refills given. Labs drawn today.

## 2018-08-04 NOTE — Progress Notes (Signed)
BP 118/62   Pulse 91   Temp 98.5 F (36.9 C) (Oral)   LMP  (LMP Unknown)   SpO2 93%    Subjective:    Patient ID: Melanie Rollins, female    DOB: [birth year], 73 y.o.   MRN: [id number]  HPI: Melanie Rollins is a 73 y.o. female  Chief Complaint  Patient presents with  . Follow-up    pt had wellness exam a couple of weeks ago    DEPRESSION Mood status: stable Satisfied with current treatment?: yes Symptom severity: mild  Duration of current treatment : chronic Side effects: no Medication compliance: excellent compliance Psychotherapy/counseling: no  Previous psychiatric medications: sertraline Depressed mood: no Anxious mood: no Anhedonia: no Significant weight loss or gain: no Insomnia: no  Fatigue: no Feelings of worthlessness or guilt: no Impaired concentration/indecisiveness: no Suicidal ideations: no Hopelessness: no Crying spells: no Depression screen [facility] 2/9 08/04/2018 07/21/2018 01/13/2018 10/28/2017 10/10/2017  Decreased Interest 0 0 0 0 0  Down, Depressed, Hopeless 0 0 0 0 0  PHQ - 2 Score 0 0 0 0 0  Altered sleeping 0 - _0 Tired, decreased energy 0 - _1 Change in appetite 0 - 0 1 0  Feeling bad or failure about yourself  0 - 0 0 0  Trouble concentrating 0 - 0 1 0  Moving slowly or fidgety/restless 0 - 0 0 0  Suicidal thoughts 0 - 0 0 0  PHQ-9 Score 0 - _2 Difficult doing work/chores Not difficult at all - Not difficult at all Somewhat difficult -  Some recent data might be hidden   Going to coming off the fosamax, and going onto prolia- following with rheumatology  Relevant past medical, surgical, family and social history reviewed and updated as indicated. Interim medical history since our last visit reviewed. Allergies and medications reviewed and updated.  Review of Systems  Constitutional: Negative.   Respiratory: Negative.   Cardiovascular: Negative.   Musculoskeletal: Negative.   Skin: Negative.   Neurological: Negative.    Psychiatric/Behavioral: Negative.     Per HPI unless specifically indicated above     Objective:    BP 118/62   Pulse 91   Temp 98.5 F (36.9 C) (Oral)   LMP  (LMP Unknown)   SpO2 93%   Wt Readings from Last 3 Encounters:  07/21/18 134 lb 6.4 oz (61 kg)  03/25/18 141 lb 3.2 oz (64 kg)  03/21/18 150 lb 9.6 oz (68.3 kg)    Physical Exam Vitals signs and nursing note reviewed.  Constitutional:      General: She is not in acute distress.    Appearance: Normal appearance. She is not ill-appearing, toxic-appearing or diaphoretic.  HENT:     Head: Normocephalic and atraumatic.     Right Ear: External ear normal.     Left Ear: External ear normal.     Nose: Nose normal.     Mouth/Throat:     Mouth: Mucous membranes are moist.     Pharynx: Oropharynx is clear.  Eyes:     General: No scleral icterus.       Right eye: No discharge.        Left eye: No discharge.     Extraocular Movements: Extraocular movements intact.     Conjunctiva/sclera: Conjunctivae normal.     Pupils: Pupils are equal, round, and reactive to light.  Neck:     Musculoskeletal: Normal range of motion  and neck supple.  Cardiovascular:     Rate and Rhythm: Normal rate and regular rhythm.     Pulses: Normal pulses.     Heart sounds: Normal heart sounds. No murmur. No friction rub. No gallop.   Pulmonary:     Effort: Pulmonary effort is normal. No respiratory distress.     Breath sounds: Normal breath sounds. No stridor. No wheezing, rhonchi or rales.  Chest:     Chest wall: No tenderness.  Musculoskeletal: Normal range of motion.  Skin:    General: Skin is warm and dry.     Capillary Refill: Capillary refill takes less than 2 seconds.     Coloration: Skin is not jaundiced or pale.     Findings: No bruising, erythema, lesion or rash.  Neurological:     General: No focal deficit present.     Mental Status: She is alert and oriented to person, place, and time. Mental status is at baseline.  Psychiatric:         Mood and Affect: Mood normal.        Behavior: Behavior normal.        Thought Content: Thought content normal.        Judgment: Judgment normal.     Results for orders placed or performed in visit on 08/04/18  Microscopic Examination  Result Value Ref Range   WBC, UA 0-5 0 - 5 /hpf   RBC, UA None seen 0 - 2 /hpf   Epithelial Cells (non renal) 0-10 0 - 10 /hpf   Bacteria, UA Few (A) None seen/Few  Urine Culture, Reflex  Result Value Ref Range   Urine Culture, Routine WILL FOLLOW   UA/M w/rflx Culture, Routine  Result Value Ref Range   Specific Gravity, UA 1.010 1.005 - 1.030   pH, UA 6.5 5.0 - 7.5   Color, UA Yellow Yellow   Appearance Ur Clear Clear   Leukocytes, UA 2+ (A) Negative   Protein, UA Negative Negative/Trace   Glucose, UA Negative Negative   Ketones, UA Negative Negative   RBC, UA Negative Negative   Bilirubin, UA Negative Negative   Urobilinogen, Ur 0.2 0.2 - 1.0 mg/dL   Nitrite, UA Negative Negative   Microscopic Examination See below:    Urinalysis Reflex Comment       Assessment & Plan:   Problem List Items Addressed This Visit      Respiratory   Pulmonary fibrosis ([place]) - Primary    Stable. Continue to follow with pulmonology. Recommended self-quarantine during COVID pandemic. Call with any concerns.       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 Continue to follow with pulmonology. Recommended self-quarantine during COVID pandemic. Call with any concerns.       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 Call with any concerns. Continue to monitor.       GERD without esophagitis    Under good control on current regimen.  Continue current regimen. Continue to monitor. Call with any concerns. Refills given. Labs drawn today.       Relevant Medications   omeprazole (PRILOSEC) 20 MG capsule  Other Relevant Orders   CBC with Differential/Platelet   Comprehensive metabolic panel   TSH   UA/M w/rflx Culture, Routine (Completed)     Other   Anxiety and depression    Under good control on current regimen. Continue current regimen. Continue to monitor. Call with any concerns. Refills given. Labs drawn today.      Relevant Medications   sertraline (ZOLOFT) 50 MG tablet   Other Relevant Orders   CBC with Differential/Platelet   Comprehensive metabolic panel   TSH   UA/M w/rflx Culture, Routine (Completed)   Hyperlipidemia    Under good control on current regimen. Continue current regimen. Continue to monitor. Call with any concerns. Refills given. Labs drawn today.      Relevant Orders   CBC with Differential/Platelet   Comprehensive metabolic panel   Lipid Panel w/o Chol/HDL Ratio   TSH   UA/M w/rflx Culture, Routine (Completed)   Insomnia    Uses her Lorrin Mais very sparingly. 30 pills given today- should last 1 year.       Connective tissue disease ([place])    Continue to follow with rheumatology. Call with any concerns.       Elevated liver enzymes    Rechecking levels today. Await results. Call with any concerns.       Relevant Orders   CBC with Differential/Platelet   Comprehensive metabolic panel   TSH   UA/M w/rflx Culture, Routine (Completed)       Follow up plan: Return in about 6 months (around 02/04/2019) for Follow up.

## 2018-08-04 NOTE — Assessment & Plan Note (Signed)
Stable. Continue to follow with pulmonology. Recommended self-quarantine during COVID pandemic. Call with any concerns.  

## 2018-08-04 NOTE — Assessment & Plan Note (Signed)
Continue to follow with rheumatology. Call with any concerns.

## 2018-08-04 NOTE — Assessment & Plan Note (Signed)
Stable. Continue to follow with pulmonology. Recommended self-quarantine during COVID pandemic. Call with any concerns.

## 2018-08-05 LAB — LIPID PANEL W/O CHOL/HDL RATIO
CHOLESTEROL TOTAL: 222 mg/dL — AB (ref 100–199)
HDL: 75 mg/dL (ref >39)
LDL Calculated: 114 mg/dL — ABNORMAL HIGH (ref 0–99)
TRIGLYCERIDES: 165 mg/dL — AB (ref 0–149)
VLDL Cholesterol Cal: 33 mg/dL (ref 5–40)

## 2018-08-05 LAB — CBC WITH DIFFERENTIAL/PLATELET
BASOS: 1 %
Basophils Absolute: 0 10*3/uL (ref 0.0–0.2)
EOS (ABSOLUTE): 0.2 10*3/uL (ref 0.0–0.4)
Eos: 3 %
Hematocrit: 31.7 % — ABNORMAL LOW (ref 34.0–46.6)
Hemoglobin: 10.9 g/dL — ABNORMAL LOW (ref 11.1–15.9)
IMMATURE GRANULOCYTES: 1 %
Immature Grans (Abs): 0 10*3/uL (ref 0.0–0.1)
LYMPHS: 20 %
Lymphocytes Absolute: 1.5 10*3/uL (ref 0.7–3.1)
MCH: 29.9 pg (ref 26.6–33.0)
MCHC: 34.4 g/dL (ref 31.5–35.7)
MCV: 87 fL (ref 79–97)
MONOS ABS: 0.7 10*3/uL (ref 0.1–0.9)
Monocytes: 9 %
NEUTROS PCT: 66 %
Neutrophils Absolute: 5.2 10*3/uL (ref 1.4–7.0)
PLATELETS: 289 10*3/uL (ref 150–450)
RBC: 3.65 x10E6/uL — ABNORMAL LOW (ref 3.77–5.28)
RDW: 13.8 % (ref 11.7–15.4)
WBC: 7.6 10*3/uL (ref 3.4–10.8)

## 2018-08-05 LAB — COMPREHENSIVE METABOLIC PANEL
A/G RATIO: 2 (ref 1.2–2.2)
ALK PHOS: 65 IU/L (ref 39–117)
ALT: 16 IU/L (ref 0–32)
AST: 28 IU/L (ref 0–40)
Albumin: 4.2 g/dL (ref 3.7–4.7)
BUN/Creatinine Ratio: 16 (ref 12–28)
BUN: 13 mg/dL (ref 8–27)
Bilirubin Total: 0.2 mg/dL (ref 0.0–1.2)
CALCIUM: 9.2 mg/dL (ref 8.7–10.3)
CHLORIDE: 98 mmol/L (ref 96–106)
CO2: 24 mmol/L (ref 20–29)
Creatinine, Ser: 0.8 mg/dL (ref 0.57–1.00)
GFR calc Af Amer: 86 mL/min/{1.73_m2} (ref >59)
GFR, EST NON AFRICAN AMERICAN: 74 mL/min/{1.73_m2} (ref >59)
GLOBULIN, TOTAL: 2.1 g/dL (ref 1.5–4.5)
Glucose: 72 mg/dL (ref 65–99)
POTASSIUM: 3.7 mmol/L (ref 3.5–5.2)
SODIUM: 137 mmol/L (ref 134–144)
Total Protein: 6.3 g/dL (ref 6.0–8.5)

## 2018-08-05 LAB — TSH: TSH: 1.41 u[IU]/mL (ref 0.450–4.500)

## 2018-08-06 DIAGNOSIS — R262 Difficulty in walking, not elsewhere classified: Secondary | ICD-10-CM | POA: Diagnosis not present

## 2018-08-06 DIAGNOSIS — M25552 Pain in left hip: Secondary | ICD-10-CM | POA: Diagnosis not present

## 2018-08-06 LAB — MICROSCOPIC EXAMINATION: RBC, UA: NONE SEEN /hpf (ref 0–2)

## 2018-08-06 LAB — UA/M W/RFLX CULTURE, ROUTINE
Bilirubin, UA: NEGATIVE
Glucose, UA: NEGATIVE
Ketones, UA: NEGATIVE
NITRITE UA: NEGATIVE
PH UA: 6.5 (ref 5.0–7.5)
PROTEIN UA: NEGATIVE
RBC, UA: NEGATIVE
Specific Gravity, UA: 1.01 (ref 1.005–1.030)
Urobilinogen, Ur: 0.2 mg/dL (ref 0.2–1.0)

## 2018-08-06 LAB — URINE CULTURE, REFLEX

## 2018-08-17 ENCOUNTER — Encounter: Payer: Self-pay | Admitting: Family Medicine

## 2018-08-18 ENCOUNTER — Telehealth: Payer: Self-pay

## 2018-08-18 MED ORDER — GENTAMICIN SULFATE 0.1 % EX OINT: 1.0000 "application " | TOPICAL_OINTMENT | Freq: Three times a day (TID) | CUTANEOUS | 0 refills | Status: DC

## 2018-08-18 MED ORDER — ACYCLOVIR 5 % EX OINT: 1.0000 "application " | TOPICAL_OINTMENT | CUTANEOUS | 1 refills | Status: AC

## 2018-08-18 NOTE — Telephone Encounter (Signed)
PA was approved.

## 2018-08-18 NOTE — Telephone Encounter (Signed)
Prior Authorization initiated via CoverMyMeds for Acyclovir 5% ointment  Key: [id number]

## 2018-09-02 ENCOUNTER — Other Ambulatory Visit: Payer: Self-pay | Admitting: Adult Health

## 2018-09-04 ENCOUNTER — Other Ambulatory Visit: Payer: Medicare Other

## 2018-09-19 ENCOUNTER — Other Ambulatory Visit: Payer: Self-pay | Admitting: Adult Health

## 2018-09-23 ENCOUNTER — Other Ambulatory Visit: Payer: Self-pay | Admitting: Adult Health

## 2018-09-24 DIAGNOSIS — M8080XD Other osteoporosis with current pathological fracture, unspecified site, subsequent encounter for fracture with routine healing: Secondary | ICD-10-CM | POA: Diagnosis not present

## 2018-10-14 DIAGNOSIS — J8410 Pulmonary fibrosis, unspecified: Secondary | ICD-10-CM | POA: Diagnosis not present

## 2018-10-14 DIAGNOSIS — Z79899 Other long term (current) drug therapy: Secondary | ICD-10-CM | POA: Diagnosis not present

## 2018-10-14 DIAGNOSIS — M368 Systemic disorders of connective tissue in other diseases classified elsewhere: Secondary | ICD-10-CM | POA: Diagnosis not present

## 2018-10-14 DIAGNOSIS — J984 Other disorders of lung: Secondary | ICD-10-CM | POA: Diagnosis not present

## 2018-10-21 DIAGNOSIS — M542 Cervicalgia: Secondary | ICD-10-CM | POA: Diagnosis not present

## 2018-10-21 DIAGNOSIS — M545 Low back pain: Secondary | ICD-10-CM | POA: Diagnosis not present

## 2018-10-21 DIAGNOSIS — S72002D Fracture of unspecified part of neck of left femur, subsequent encounter for closed fracture with routine healing: Secondary | ICD-10-CM | POA: Diagnosis not present

## 2018-10-29 ENCOUNTER — Ambulatory Visit
Admission: RE | Admit: 2018-10-29 | Discharge: 2018-10-29 | Disposition: A | Discharge status: Home / Self Care | Payer: Medicare Other | Source: Ambulatory Visit | Attending: Family Medicine | Admitting: Family Medicine

## 2018-10-29 ENCOUNTER — Other Ambulatory Visit: Payer: Self-pay

## 2018-10-29 DIAGNOSIS — M8580 Other specified disorders of bone density and structure, unspecified site: Secondary | ICD-10-CM | POA: Diagnosis not present

## 2018-10-29 DIAGNOSIS — Z1231 Encounter for screening mammogram for malignant neoplasm of breast: Secondary | ICD-10-CM | POA: Insufficient documentation

## 2018-10-29 DIAGNOSIS — Z780 Asymptomatic menopausal state: Secondary | ICD-10-CM | POA: Insufficient documentation

## 2018-10-29 DIAGNOSIS — M85852 Other specified disorders of bone density and structure, left thigh: Secondary | ICD-10-CM | POA: Diagnosis not present

## 2018-10-29 DIAGNOSIS — M85851 Other specified disorders of bone density and structure, right thigh: Secondary | ICD-10-CM | POA: Diagnosis not present

## 2018-11-11 DIAGNOSIS — M25552 Pain in left hip: Secondary | ICD-10-CM | POA: Diagnosis not present

## 2018-11-11 DIAGNOSIS — M545 Low back pain: Secondary | ICD-10-CM | POA: Diagnosis not present

## 2018-11-11 DIAGNOSIS — M542 Cervicalgia: Secondary | ICD-10-CM | POA: Diagnosis not present

## 2018-11-11 DIAGNOSIS — M25551 Pain in right hip: Secondary | ICD-10-CM | POA: Diagnosis not present

## 2018-11-13 DIAGNOSIS — M542 Cervicalgia: Secondary | ICD-10-CM | POA: Diagnosis not present

## 2018-11-13 DIAGNOSIS — M545 Low back pain: Secondary | ICD-10-CM | POA: Diagnosis not present

## 2018-11-13 DIAGNOSIS — M25552 Pain in left hip: Secondary | ICD-10-CM | POA: Diagnosis not present

## 2018-11-13 DIAGNOSIS — M25551 Pain in right hip: Secondary | ICD-10-CM | POA: Diagnosis not present

## 2018-11-18 DIAGNOSIS — M542 Cervicalgia: Secondary | ICD-10-CM | POA: Diagnosis not present

## 2018-11-18 DIAGNOSIS — M545 Low back pain: Secondary | ICD-10-CM | POA: Diagnosis not present

## 2018-11-18 DIAGNOSIS — M25551 Pain in right hip: Secondary | ICD-10-CM | POA: Diagnosis not present

## 2018-11-18 DIAGNOSIS — M25552 Pain in left hip: Secondary | ICD-10-CM | POA: Diagnosis not present

## 2018-11-24 DIAGNOSIS — M542 Cervicalgia: Secondary | ICD-10-CM | POA: Diagnosis not present

## 2018-11-24 DIAGNOSIS — M25551 Pain in right hip: Secondary | ICD-10-CM | POA: Diagnosis not present

## 2018-11-24 DIAGNOSIS — M545 Low back pain: Secondary | ICD-10-CM | POA: Diagnosis not present

## 2018-11-24 DIAGNOSIS — M25552 Pain in left hip: Secondary | ICD-10-CM | POA: Diagnosis not present

## 2018-11-26 DIAGNOSIS — M542 Cervicalgia: Secondary | ICD-10-CM | POA: Diagnosis not present

## 2018-11-26 DIAGNOSIS — M25551 Pain in right hip: Secondary | ICD-10-CM | POA: Diagnosis not present

## 2018-11-26 DIAGNOSIS — M545 Low back pain: Secondary | ICD-10-CM | POA: Diagnosis not present

## 2018-11-26 DIAGNOSIS — M25552 Pain in left hip: Secondary | ICD-10-CM | POA: Diagnosis not present

## 2018-12-02 DIAGNOSIS — M542 Cervicalgia: Secondary | ICD-10-CM | POA: Diagnosis not present

## 2018-12-02 DIAGNOSIS — M25551 Pain in right hip: Secondary | ICD-10-CM | POA: Diagnosis not present

## 2018-12-02 DIAGNOSIS — M545 Low back pain: Secondary | ICD-10-CM | POA: Diagnosis not present

## 2018-12-02 DIAGNOSIS — M25552 Pain in left hip: Secondary | ICD-10-CM | POA: Diagnosis not present

## 2018-12-05 DIAGNOSIS — M25551 Pain in right hip: Secondary | ICD-10-CM | POA: Diagnosis not present

## 2018-12-05 DIAGNOSIS — M545 Low back pain: Secondary | ICD-10-CM | POA: Diagnosis not present

## 2018-12-05 DIAGNOSIS — M542 Cervicalgia: Secondary | ICD-10-CM | POA: Diagnosis not present

## 2018-12-05 DIAGNOSIS — M25552 Pain in left hip: Secondary | ICD-10-CM | POA: Diagnosis not present

## 2018-12-09 DIAGNOSIS — M542 Cervicalgia: Secondary | ICD-10-CM | POA: Diagnosis not present

## 2018-12-09 DIAGNOSIS — M25551 Pain in right hip: Secondary | ICD-10-CM | POA: Diagnosis not present

## 2018-12-09 DIAGNOSIS — M545 Low back pain: Secondary | ICD-10-CM | POA: Diagnosis not present

## 2018-12-09 DIAGNOSIS — M25552 Pain in left hip: Secondary | ICD-10-CM | POA: Diagnosis not present

## 2018-12-11 DIAGNOSIS — L578 Other skin changes due to chronic exposure to nonionizing radiation: Secondary | ICD-10-CM | POA: Diagnosis not present

## 2018-12-11 DIAGNOSIS — L718 Other rosacea: Secondary | ICD-10-CM | POA: Diagnosis not present

## 2018-12-11 DIAGNOSIS — D485 Neoplasm of uncertain behavior of skin: Secondary | ICD-10-CM | POA: Diagnosis not present

## 2018-12-11 DIAGNOSIS — D0472 Carcinoma in situ of skin of left lower limb, including hip: Secondary | ICD-10-CM | POA: Diagnosis not present

## 2018-12-11 DIAGNOSIS — L570 Actinic keratosis: Secondary | ICD-10-CM | POA: Diagnosis not present

## 2018-12-11 DIAGNOSIS — Z872 Personal history of diseases of the skin and subcutaneous tissue: Secondary | ICD-10-CM | POA: Diagnosis not present

## 2018-12-15 DIAGNOSIS — M545 Low back pain: Secondary | ICD-10-CM | POA: Diagnosis not present

## 2018-12-15 DIAGNOSIS — M542 Cervicalgia: Secondary | ICD-10-CM | POA: Diagnosis not present

## 2018-12-15 DIAGNOSIS — M25551 Pain in right hip: Secondary | ICD-10-CM | POA: Diagnosis not present

## 2018-12-15 DIAGNOSIS — M25552 Pain in left hip: Secondary | ICD-10-CM | POA: Diagnosis not present

## 2018-12-18 DIAGNOSIS — M25551 Pain in right hip: Secondary | ICD-10-CM | POA: Diagnosis not present

## 2018-12-18 DIAGNOSIS — M542 Cervicalgia: Secondary | ICD-10-CM | POA: Diagnosis not present

## 2018-12-18 DIAGNOSIS — M545 Low back pain: Secondary | ICD-10-CM | POA: Diagnosis not present

## 2018-12-18 DIAGNOSIS — M25552 Pain in left hip: Secondary | ICD-10-CM | POA: Diagnosis not present

## 2018-12-23 DIAGNOSIS — M545 Low back pain: Secondary | ICD-10-CM | POA: Diagnosis not present

## 2018-12-23 DIAGNOSIS — M25552 Pain in left hip: Secondary | ICD-10-CM | POA: Diagnosis not present

## 2018-12-23 DIAGNOSIS — M25551 Pain in right hip: Secondary | ICD-10-CM | POA: Diagnosis not present

## 2018-12-23 DIAGNOSIS — M542 Cervicalgia: Secondary | ICD-10-CM | POA: Diagnosis not present

## 2018-12-24 DIAGNOSIS — Z79899 Other long term (current) drug therapy: Secondary | ICD-10-CM | POA: Diagnosis not present

## 2018-12-25 DIAGNOSIS — M25551 Pain in right hip: Secondary | ICD-10-CM | POA: Diagnosis not present

## 2018-12-25 DIAGNOSIS — M542 Cervicalgia: Secondary | ICD-10-CM | POA: Diagnosis not present

## 2018-12-25 DIAGNOSIS — M545 Low back pain: Secondary | ICD-10-CM | POA: Diagnosis not present

## 2018-12-25 DIAGNOSIS — M25552 Pain in left hip: Secondary | ICD-10-CM | POA: Diagnosis not present

## 2018-12-30 DIAGNOSIS — M542 Cervicalgia: Secondary | ICD-10-CM | POA: Diagnosis not present

## 2018-12-30 DIAGNOSIS — M25551 Pain in right hip: Secondary | ICD-10-CM | POA: Diagnosis not present

## 2018-12-30 DIAGNOSIS — M25552 Pain in left hip: Secondary | ICD-10-CM | POA: Diagnosis not present

## 2018-12-30 DIAGNOSIS — M545 Low back pain: Secondary | ICD-10-CM | POA: Diagnosis not present

## 2019-01-01 DIAGNOSIS — M545 Low back pain: Secondary | ICD-10-CM | POA: Diagnosis not present

## 2019-01-01 DIAGNOSIS — M25551 Pain in right hip: Secondary | ICD-10-CM | POA: Diagnosis not present

## 2019-01-01 DIAGNOSIS — M25552 Pain in left hip: Secondary | ICD-10-CM | POA: Diagnosis not present

## 2019-01-01 DIAGNOSIS — M542 Cervicalgia: Secondary | ICD-10-CM | POA: Diagnosis not present

## 2019-01-05 DIAGNOSIS — M25551 Pain in right hip: Secondary | ICD-10-CM | POA: Diagnosis not present

## 2019-01-05 DIAGNOSIS — M545 Low back pain: Secondary | ICD-10-CM | POA: Diagnosis not present

## 2019-01-05 DIAGNOSIS — M542 Cervicalgia: Secondary | ICD-10-CM | POA: Diagnosis not present

## 2019-01-05 DIAGNOSIS — M25552 Pain in left hip: Secondary | ICD-10-CM | POA: Diagnosis not present

## 2019-01-07 DIAGNOSIS — C44729 Squamous cell carcinoma of skin of left lower limb, including hip: Secondary | ICD-10-CM | POA: Diagnosis not present

## 2019-01-13 DIAGNOSIS — M25552 Pain in left hip: Secondary | ICD-10-CM | POA: Diagnosis not present

## 2019-01-13 DIAGNOSIS — M25551 Pain in right hip: Secondary | ICD-10-CM | POA: Diagnosis not present

## 2019-01-13 DIAGNOSIS — M545 Low back pain: Secondary | ICD-10-CM | POA: Diagnosis not present

## 2019-01-13 DIAGNOSIS — M542 Cervicalgia: Secondary | ICD-10-CM | POA: Diagnosis not present

## 2019-01-15 DIAGNOSIS — I872 Venous insufficiency (chronic) (peripheral): Secondary | ICD-10-CM | POA: Diagnosis not present

## 2019-01-20 DIAGNOSIS — M545 Low back pain: Secondary | ICD-10-CM | POA: Diagnosis not present

## 2019-01-20 DIAGNOSIS — M542 Cervicalgia: Secondary | ICD-10-CM | POA: Diagnosis not present

## 2019-01-20 DIAGNOSIS — M25552 Pain in left hip: Secondary | ICD-10-CM | POA: Diagnosis not present

## 2019-01-20 DIAGNOSIS — I872 Venous insufficiency (chronic) (peripheral): Secondary | ICD-10-CM | POA: Diagnosis not present

## 2019-01-20 DIAGNOSIS — M25551 Pain in right hip: Secondary | ICD-10-CM | POA: Diagnosis not present

## 2019-01-22 DIAGNOSIS — M810 Age-related osteoporosis without current pathological fracture: Secondary | ICD-10-CM | POA: Diagnosis not present

## 2019-01-22 DIAGNOSIS — J849 Interstitial pulmonary disease, unspecified: Secondary | ICD-10-CM | POA: Diagnosis not present

## 2019-01-22 DIAGNOSIS — M359 Systemic involvement of connective tissue, unspecified: Secondary | ICD-10-CM | POA: Diagnosis not present

## 2019-01-22 DIAGNOSIS — I7300 Raynaud's syndrome without gangrene: Secondary | ICD-10-CM | POA: Diagnosis not present

## 2019-01-23 DIAGNOSIS — M25551 Pain in right hip: Secondary | ICD-10-CM | POA: Diagnosis not present

## 2019-01-23 DIAGNOSIS — M542 Cervicalgia: Secondary | ICD-10-CM | POA: Diagnosis not present

## 2019-01-23 DIAGNOSIS — M545 Low back pain: Secondary | ICD-10-CM | POA: Diagnosis not present

## 2019-01-23 DIAGNOSIS — M25552 Pain in left hip: Secondary | ICD-10-CM | POA: Diagnosis not present

## 2019-01-27 DIAGNOSIS — M542 Cervicalgia: Secondary | ICD-10-CM | POA: Diagnosis not present

## 2019-01-27 DIAGNOSIS — M25551 Pain in right hip: Secondary | ICD-10-CM | POA: Diagnosis not present

## 2019-01-27 DIAGNOSIS — M25552 Pain in left hip: Secondary | ICD-10-CM | POA: Diagnosis not present

## 2019-01-27 DIAGNOSIS — M545 Low back pain: Secondary | ICD-10-CM | POA: Diagnosis not present

## 2019-01-30 DIAGNOSIS — M545 Low back pain: Secondary | ICD-10-CM | POA: Diagnosis not present

## 2019-01-30 DIAGNOSIS — M25551 Pain in right hip: Secondary | ICD-10-CM | POA: Diagnosis not present

## 2019-01-30 DIAGNOSIS — M25552 Pain in left hip: Secondary | ICD-10-CM | POA: Diagnosis not present

## 2019-01-30 DIAGNOSIS — M542 Cervicalgia: Secondary | ICD-10-CM | POA: Diagnosis not present

## 2019-02-03 DIAGNOSIS — M25551 Pain in right hip: Secondary | ICD-10-CM | POA: Diagnosis not present

## 2019-02-03 DIAGNOSIS — M545 Low back pain: Secondary | ICD-10-CM | POA: Diagnosis not present

## 2019-02-03 DIAGNOSIS — M542 Cervicalgia: Secondary | ICD-10-CM | POA: Diagnosis not present

## 2019-02-03 DIAGNOSIS — M25552 Pain in left hip: Secondary | ICD-10-CM | POA: Diagnosis not present

## 2019-02-04 ENCOUNTER — Other Ambulatory Visit: Payer: Self-pay

## 2019-02-04 ENCOUNTER — Encounter: Payer: Self-pay | Admitting: Family Medicine

## 2019-02-04 ENCOUNTER — Ambulatory Visit (INDEPENDENT_AMBULATORY_CARE_PROVIDER_SITE_OTHER): Payer: Medicare Other | Admitting: Family Medicine

## 2019-02-04 VITALS — BP 110/53 | HR 53 | Temp 97.7°F | Wt 124.0 lb

## 2019-02-04 DIAGNOSIS — F329 Major depressive disorder, single episode, unspecified: Secondary | ICD-10-CM | POA: Diagnosis not present

## 2019-02-04 DIAGNOSIS — F5101 Primary insomnia: Secondary | ICD-10-CM

## 2019-02-04 DIAGNOSIS — Z23 Encounter for immunization: Secondary | ICD-10-CM | POA: Diagnosis not present

## 2019-02-04 DIAGNOSIS — M8000XS Age-related osteoporosis with current pathological fracture, unspecified site, sequela: Secondary | ICD-10-CM

## 2019-02-04 DIAGNOSIS — E782 Mixed hyperlipidemia: Secondary | ICD-10-CM | POA: Diagnosis not present

## 2019-02-04 DIAGNOSIS — F419 Anxiety disorder, unspecified: Secondary | ICD-10-CM

## 2019-02-04 DIAGNOSIS — I952 Hypotension due to drugs: Secondary | ICD-10-CM

## 2019-02-04 MED ORDER — SERTRALINE HCL 50 MG PO TABS: 50.0000 mg | ORAL_TABLET | Freq: Every day | ORAL | 1 refills | Status: DC

## 2019-02-04 MED ORDER — ZOLPIDEM TARTRATE 10 MG PO TABS: 5.0000 mg | ORAL_TABLET | Freq: Every day | ORAL | 1 refills | Status: AC

## 2019-02-04 MED ORDER — AMLODIPINE BESYLATE 2.5 MG PO TABS: 1.2500 mg | ORAL_TABLET | Freq: Every day | ORAL | Status: DC

## 2019-02-04 NOTE — Assessment & Plan Note (Signed)
Rechecking levels today. Await results. Call with any concerns.

## 2019-02-04 NOTE — Assessment & Plan Note (Signed)
Taking Lorrin Mais a couple of time a week. Will refill. Continue to take very occasionally. Call with any concerns.

## 2019-02-04 NOTE — Assessment & Plan Note (Signed)
Under good control on current regimen. Continue current regimen. Continue to monitor. Call with any concerns. Refills given.   

## 2019-02-04 NOTE — Assessment & Plan Note (Signed)
On prolia. Tolerating it well. No concerns.

## 2019-02-04 NOTE — Progress Notes (Signed)
BP (!) 110/53   Pulse (!) 53   Temp 97.7 F (36.5 C)   Wt 124 lb (56.2 kg)   LMP  (LMP Unknown)   BMI 21.97 kg/m    Subjective:    Patient ID: Melanie Rollins, female    DOB: [birth year], 73 y.o.   MRN: [id number]  HPI: Melanie Rollins is a 73 y.o. female  Chief Complaint  Patient presents with  . Hyperlipidemia  . Depression  . Insomnia  . Osteoporosis   OSTEOPOROSIS- has been on prolia Satisfied with current treatment?: yes Medication side effects: no Medication compliance: excellent compliance Past osteoporosis medications/treatments:  Adequate calcium & vitamin D: yes Intolerance to bisphosphonates:yes Weight bearing exercises: yes  HYPERLIPIDEMIA Hyperlipidemia status: stable Satisfied with current treatment?  yes Side effects:  Not on anything Medication compliance: not on anything Past cholesterol meds: none Supplements: fish oil Aspirin:  no The 10-year ASCVD risk score Mikey Bussing DC Jr., et al., 2013) is: 8.8%   Values used to calculate the score:     Age: 53 years     Sex: Female     Is Non-Hispanic African American: No     Diabetic: No     Tobacco smoker: No     Systolic Blood Pressure: 572 mmHg     Is BP treated: No     HDL Cholesterol: 75 mg/dL     Total Cholesterol: 222 mg/dL Chest pain:  no Coronary artery disease:  no  ANXIETY/DEPRESSION Duration:controlled Anxious mood: no  Excessive worrying: no Irritability: no  Sweating: no Nausea: no Palpitations:no Hyperventilation: no Panic attacks: no Agoraphobia: no  Obscessions/compulsions: no Depressed mood: no Depression screen [facility] 2/9 02/04/2019 08/04/2018 07/21/2018 01/13/2018 10/28/2017  Decreased Interest 0 0 0 0 0  Down, Depressed, Hopeless 0 0 0 0 0  PHQ - 2 Score 0 0 0 0 0  Altered sleeping 1 0 - 1 2  Tired, decreased energy 3 0 - 1 3  Change in appetite 0 0 - 0 1  Feeling bad or failure about yourself  0 0 - 0 0  Trouble concentrating 0 0 - 0 1  Moving slowly or fidgety/restless 0 0  - 0 0  Suicidal thoughts 0 0 - 0 0  PHQ-9 Score 4 0 - 2 7  Difficult doing work/chores Not difficult at all Not difficult at all - Not difficult at all Somewhat difficult  Some recent data might be hidden   GAD 7 : Generalized Anxiety Score 01/13/2018 10/28/2017 06/08/2015  Nervous, Anxious, on Edge 0 0 1  Control/stop worrying 0 0 1  Worry too much - different things 0 0 1  Trouble relaxing 0 0 0  Restless 0 0 0  Easily annoyed or irritable 0 0 0  Afraid - awful might happen 0 0 1  Total GAD 7 Score 0 0 4  Anxiety Difficulty Not difficult at all Not difficult at all -   Anhedonia: no Weight changes: no Insomnia: yes hard to fall asleep  Hypersomnia: no Fatigue/loss of energy: no Feelings of worthlessness: no Feelings of guilt: no Impaired concentration/indecisiveness: no Suicidal ideations: no  Crying spells: no Recent Stressors/Life Changes: no   Relationship problems: no   Family stress: no     Financial stress: no    Job stress: no    Recent death/loss: no  INSOMNIA- taking her Lorrin Mais a couple of time a week Duration: chronic Satisfied with sleep quality: yes Difficulty falling asleep: yes Difficulty staying asleep:  no Waking a few hours after sleep onset: no Early morning awakenings: no Daytime hypersomnolence: no Wakes feeling refreshed: no Good sleep hygiene: no Apnea: no Snoring: no Depressed/anxious mood: yes Recent stress: no Restless legs/nocturnal leg cramps: no Chronic pain/arthritis: no History of sleep study: no Treatments attempted: melatonin, uinsom, benadryl and ambien   Relevant past medical, surgical, family and social history reviewed and updated as indicated. Interim medical history since our last visit reviewed. Allergies and medications reviewed and updated.  Review of Systems  Constitutional: Negative.   HENT: Negative.   Respiratory: Negative.   Cardiovascular: Negative.   Gastrointestinal: Negative.   Genitourinary: Negative.    Musculoskeletal: Negative.   Neurological: Positive for dizziness and light-headedness. Negative for tremors, seizures, syncope, facial asymmetry, speech difficulty, weakness, numbness and headaches.  Psychiatric/Behavioral: Negative.     Per HPI unless specifically indicated above     Objective:    BP (!) 110/53   Pulse (!) 53   Temp 97.7 F (36.5 C)   Wt 124 lb (56.2 kg)   LMP  (LMP Unknown)   BMI 21.97 kg/m   Wt Readings from Last 3 Encounters:  02/04/19 124 lb (56.2 kg)  07/21/18 134 lb 6.4 oz (61 kg)  03/25/18 141 lb 3.2 oz (64 kg)    Physical Exam Vitals signs and nursing note reviewed.  Constitutional:      General: She is not in acute distress.    Appearance: Normal appearance. She is not ill-appearing, toxic-appearing or diaphoretic.  HENT:     Head: Normocephalic and atraumatic.     Right Ear: External ear normal.     Left Ear: External ear normal.     Nose: Nose normal.     Mouth/Throat:     Mouth: Mucous membranes are moist.     Pharynx: Oropharynx is clear.  Eyes:     General: No scleral icterus.       Right eye: No discharge.        Left eye: No discharge.     Conjunctiva/sclera: Conjunctivae normal.     Pupils: Pupils are equal, round, and reactive to light.  Neck:     Musculoskeletal: Normal range of motion.  Pulmonary:     Effort: Pulmonary effort is normal. No respiratory distress.     Comments: Speaking in full sentences Musculoskeletal: Normal range of motion.  Skin:    Coloration: Skin is not jaundiced or pale.     Findings: No bruising, erythema, lesion or rash.  Neurological:     Mental Status: She is alert and oriented to person, place, and time. Mental status is at baseline.  Psychiatric:        Mood and Affect: Mood normal.        Behavior: Behavior normal.        Thought Content: Thought content normal.        Judgment: Judgment normal.     Results for orders placed or performed in visit on 08/04/18  Microscopic Examination    URINE  Result Value Ref Range   WBC, UA 0-5 0 - 5 /hpf   RBC, UA None seen 0 - 2 /hpf   Epithelial Cells (non renal) 0-10 0 - 10 /hpf   Bacteria, UA Few (A) None seen/Few  Urine Culture, Reflex   URINE  Result Value Ref Range   Urine Culture, Routine Final report    Organism ID, Bacteria Comment   CBC with Differential/Platelet  Result Value Ref Range   WBC  7.6 3.4 - 10.8 x10E3/uL   RBC 3.65 (L) 3.77 - 5.28 x10E6/uL   Hemoglobin 10.9 (L) 11.1 - 15.9 g/dL   Hematocrit 31.7 (L) 34.0 - 46.6 %   MCV 87 79 - 97 fL   MCH 29.9 26.6 - 33.0 pg   MCHC 34.4 31.5 - 35.7 g/dL   RDW 13.8 11.7 - 15.4 %   Platelets 289 150 - 450 x10E3/uL   Neutrophils 66 Not Estab. %   Lymphs 20 Not Estab. %   Monocytes 9 Not Estab. %   Eos 3 Not Estab. %   Basos 1 Not Estab. %   Neutrophils Absolute 5.2 1.4 - 7.0 x10E3/uL   Lymphocytes Absolute 1.5 0.7 - 3.1 x10E3/uL   Monocytes Absolute 0.7 0.1 - 0.9 x10E3/uL   EOS (ABSOLUTE) 0.2 0.0 - 0.4 x10E3/uL   Basophils Absolute 0.0 0.0 - 0.2 x10E3/uL   Immature Granulocytes 1 Not Estab. %   Immature Grans (Abs) 0.0 0.0 - 0.1 x10E3/uL  Comprehensive metabolic panel  Result Value Ref Range   Glucose 72 65 - 99 mg/dL   BUN 13 8 - 27 mg/dL   Creatinine, Ser 0.80 0.57 - 1.00 mg/dL   GFR calc non Af Amer 74 >59 mL/min/1.73   GFR calc Af Amer 86 >59 mL/min/1.73   BUN/Creatinine Ratio 16 12 - 28   Sodium 137 134 - 144 mmol/L   Potassium 3.7 3.5 - 5.2 mmol/L   Chloride 98 96 - 106 mmol/L   CO2 24 20 - 29 mmol/L   Calcium 9.2 8.7 - 10.3 mg/dL   Total Protein 6.3 6.0 - 8.5 g/dL   Albumin 4.2 3.7 - 4.7 g/dL   Globulin, Total 2.1 1.5 - 4.5 g/dL   Albumin/Globulin Ratio 2.0 1.2 - 2.2   Bilirubin Total 0.2 0.0 - 1.2 mg/dL   Alkaline Phosphatase 65 39 - 117 IU/L   AST 28 0 - 40 IU/L   ALT 16 0 - 32 IU/L  Lipid Panel w/o Chol/HDL Ratio  Result Value Ref Range   Cholesterol, Total 222 (H) 100 - 199 mg/dL   Triglycerides 165 (H) 0 - 149 mg/dL   HDL 75 >39 mg/dL    VLDL Cholesterol Cal 33 5 - 40 mg/dL   LDL Calculated 114 (H) 0 - 99 mg/dL  TSH  Result Value Ref Range   TSH 1.410 0.450 - 4.500 uIU/mL  UA/M w/rflx Culture, Routine   Specimen: Urine   URINE  Result Value Ref Range   Specific Gravity, UA 1.010 1.005 - 1.030   pH, UA 6.5 5.0 - 7.5   Color, UA Yellow Yellow   Appearance Ur Clear Clear   Leukocytes, UA 2+ (A) Negative   Protein, UA Negative Negative/Trace   Glucose, UA Negative Negative   Ketones, UA Negative Negative   RBC, UA Negative Negative   Bilirubin, UA Negative Negative   Urobilinogen, Ur 0.2 0.2 - 1.0 mg/dL   Nitrite, UA Negative Negative   Microscopic Examination See below:    Urinalysis Reflex Comment       Assessment & Plan:   Problem List Items Addressed This Visit      Musculoskeletal and Integument   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 Tolerating it well. No concerns.       Relevant Medications   denosumab (PROLIA) 60 MG/ML SOSY injection     Other   Anxiety and depression    Under good control on current  regimen. Continue current regimen. Continue to monitor. Call with any concerns. Refills given.        Relevant Medications   sertraline (ZOLOFT) 50 MG tablet   Hyperlipidemia    Rechecking levels today. Await results. Call with any concerns.       Relevant Medications   amLODipine (NORVASC) 2.5 MG tablet   Other Relevant Orders   Comprehensive metabolic panel   Lipid Panel w/o Chol/HDL Ratio   Insomnia    Taking Lorrin Mais a couple of time a week. Will refill. Continue to take very occasionally. Call with any concerns.        Other Visit Diagnoses    Hypotension due to drugs       Rececently restarted on amlodipine, BP running low and has been dizzy- will have her cut it in 1/2. Call with any concerns.    Relevant Medications   amLODipine (NORVASC) 2.5 MG tablet       Follow up plan: Return in about 6 months (around 08/04/2019) for  Physical/wellness.    . This visit was completed via [employer] due to the restrictions of the COVID-19 pandemic. All issues as above were discussed and addressed. Physical exam was done as above through visual confirmation on Doximity. If it was felt that the patient should be evaluated in the office, they were directed there. The patient verbally consented to this visit. . Location of the patient: home . Location of the provider: home . Those involved with this call:  . Provider: Park Liter, DO . CMA: Tiffany Reel, CMA . Front Desk/Registration: Don Perking  . Time spent on call: 25 minutes with patient face to face via video conference. More than 50% of this time was spent in counseling and coordination of care. 40 minutes total spent in review of patient's record and preparation of their chart.

## 2019-02-16 ENCOUNTER — Other Ambulatory Visit: Payer: Self-pay | Admitting: Family Medicine

## 2019-02-19 ENCOUNTER — Telehealth: Payer: Self-pay | Admitting: Family Medicine

## 2019-02-19 NOTE — Chronic Care Management (AMB) (Signed)
Chronic Care Management   Note  02/19/2019 Name: Melanie Rollins MRN: [id number] DOB: 26-Dec-1945  Melanie Rollins is a 73 y.o. year old female who is a primary care patient of Valerie Roys, DO. I reached out to [name] by phone today in response to a referral sent by [name]'s health plan.     [name] was given information about Chronic Care Management services today including:  1. CCM service includes personalized support from designated clinical staff supervised by her physician, including individualized plan of care and coordination with other care providers 2. 24/7 contact phone numbers for assistance for urgent and routine care needs. 3. Service will only be billed when office clinical staff spend 20 minutes or more in a month to coordinate care. 4. Only one practitioner may furnish and bill the service in a calendar month. 5. The patient may stop CCM services at any time (effective at the end of the month) by phone call to the office staff. 6. The patient will be responsible for cost sharing (co-pay) of up to 20% of the service fee (after annual deductible is met).  Patient agreed to services and verbal consent obtained.   Follow up plan: Telephone appointment with CCM team member scheduled for: 03/27/2019  [place]  [email or web address]   ??[phone]

## 2019-02-23 ENCOUNTER — Other Ambulatory Visit: Payer: Medicare Other

## 2019-02-23 ENCOUNTER — Other Ambulatory Visit: Payer: Self-pay

## 2019-02-23 DIAGNOSIS — E782 Mixed hyperlipidemia: Secondary | ICD-10-CM | POA: Diagnosis not present

## 2019-02-24 LAB — COMPREHENSIVE METABOLIC PANEL
ALT: 18 IU/L (ref 0–32)
AST: 27 IU/L (ref 0–40)
Albumin/Globulin Ratio: 1.8 (ref 1.2–2.2)
Albumin: 4.4 g/dL (ref 3.7–4.7)
Alkaline Phosphatase: 62 IU/L (ref 39–117)
BUN/Creatinine Ratio: 21 (ref 12–28)
BUN: 17 mg/dL (ref 8–27)
Bilirubin Total: 0.3 mg/dL (ref 0.0–1.2)
CO2: 25 mmol/L (ref 20–29)
Calcium: 9.4 mg/dL (ref 8.7–10.3)
Chloride: 99 mmol/L (ref 96–106)
Creatinine, Ser: 0.82 mg/dL (ref 0.57–1.00)
GFR calc Af Amer: 83 mL/min/{1.73_m2} (ref >59)
GFR calc non Af Amer: 72 mL/min/{1.73_m2} (ref >59)
Globulin, Total: 2.4 g/dL (ref 1.5–4.5)
Glucose: 71 mg/dL (ref 65–99)
Potassium: 3.8 mmol/L (ref 3.5–5.2)
Sodium: 137 mmol/L (ref 134–144)
Total Protein: 6.8 g/dL (ref 6.0–8.5)

## 2019-02-24 LAB — LIPID PANEL W/O CHOL/HDL RATIO
Cholesterol, Total: 219 mg/dL — ABNORMAL HIGH (ref 100–199)
HDL: 78 mg/dL (ref >39)
LDL Chol Calc (NIH): 120 mg/dL — ABNORMAL HIGH (ref 0–99)
Triglycerides: 124 mg/dL (ref 0–149)
VLDL Cholesterol Cal: 21 mg/dL (ref 5–40)

## 2019-03-08 ENCOUNTER — Encounter: Payer: Self-pay | Admitting: Family Medicine

## 2019-03-10 MED ORDER — DICLOFENAC SODIUM 1 % TD GEL: 4.0000 g | Freq: Four times a day (QID) | TRANSDERMAL | 3 refills | Status: DC

## 2019-03-23 ENCOUNTER — Encounter: Payer: Self-pay | Admitting: Family Medicine

## 2019-03-27 ENCOUNTER — Telehealth: Payer: Medicare Other

## 2019-03-30 ENCOUNTER — Encounter: Payer: Self-pay | Admitting: Family Medicine

## 2019-03-30 ENCOUNTER — Telehealth: Payer: Self-pay

## 2019-03-30 NOTE — Telephone Encounter (Signed)
Pt returned Tiffany's call to update her. [facility] no longer need to send/receive labs, it was a mistake.

## 2019-03-30 NOTE — Telephone Encounter (Signed)
Noted

## 2019-03-30 NOTE — Telephone Encounter (Signed)
Called and left a message letting patient know that we did not get the lab orders from [place] yet, and asked if she can have them send them to [place] again.    Copied from [place] [id number]. Topic: General - Other >> Mar 27, 2019  9:54 AM Rayann Heman wrote: Reason for CRM: pt called and stated that [facility] has faxed over orders for labs and would like to know if we have received them. Please advise

## 2019-04-08 ENCOUNTER — Other Ambulatory Visit: Payer: Self-pay

## 2019-04-08 DIAGNOSIS — Z20822 Contact with and (suspected) exposure to covid-19: Secondary | ICD-10-CM

## 2019-04-08 DIAGNOSIS — Z20828 Contact with and (suspected) exposure to other viral communicable diseases: Secondary | ICD-10-CM | POA: Diagnosis not present

## 2019-04-10 LAB — NOVEL CORONAVIRUS, NAA: SARS-CoV-2, NAA: NOT DETECTED

## 2019-04-20 DIAGNOSIS — J984 Other disorders of lung: Secondary | ICD-10-CM | POA: Diagnosis not present

## 2019-04-20 DIAGNOSIS — M368 Systemic disorders of connective tissue in other diseases classified elsewhere: Secondary | ICD-10-CM | POA: Diagnosis not present

## 2019-04-20 DIAGNOSIS — J8410 Pulmonary fibrosis, unspecified: Secondary | ICD-10-CM | POA: Diagnosis not present

## 2019-04-23 DIAGNOSIS — M359 Systemic involvement of connective tissue, unspecified: Secondary | ICD-10-CM | POA: Diagnosis not present

## 2019-04-23 DIAGNOSIS — I7300 Raynaud's syndrome without gangrene: Secondary | ICD-10-CM | POA: Diagnosis not present

## 2019-04-23 DIAGNOSIS — J849 Interstitial pulmonary disease, unspecified: Secondary | ICD-10-CM | POA: Diagnosis not present

## 2019-04-28 ENCOUNTER — Telehealth: Payer: Self-pay

## 2019-05-12 ENCOUNTER — Other Ambulatory Visit: Payer: Self-pay | Admitting: Family Medicine

## 2019-05-27 ENCOUNTER — Telehealth: Payer: Self-pay

## 2019-06-08 DIAGNOSIS — M545 Low back pain, unspecified: Secondary | ICD-10-CM | POA: Insufficient documentation

## 2019-06-13 ENCOUNTER — Encounter: Payer: Self-pay | Admitting: Family Medicine

## 2019-06-30 DIAGNOSIS — M25551 Pain in right hip: Secondary | ICD-10-CM | POA: Diagnosis not present

## 2019-06-30 DIAGNOSIS — M545 Low back pain: Secondary | ICD-10-CM | POA: Diagnosis not present

## 2019-06-30 DIAGNOSIS — M1611 Unilateral primary osteoarthritis, right hip: Secondary | ICD-10-CM | POA: Diagnosis not present

## 2019-07-01 ENCOUNTER — Telehealth: Payer: Self-pay

## 2019-07-19 ENCOUNTER — Encounter: Payer: Self-pay | Admitting: Family Medicine

## 2019-07-21 ENCOUNTER — Other Ambulatory Visit: Payer: Self-pay

## 2019-07-21 ENCOUNTER — Ambulatory Visit (INDEPENDENT_AMBULATORY_CARE_PROVIDER_SITE_OTHER): Payer: Medicare Other | Admitting: Family Medicine

## 2019-07-21 ENCOUNTER — Encounter: Payer: Self-pay | Admitting: Family Medicine

## 2019-07-21 VITALS — BP 111/70 | HR 84 | Temp 97.6°F

## 2019-07-21 DIAGNOSIS — F419 Anxiety disorder, unspecified: Secondary | ICD-10-CM

## 2019-07-21 DIAGNOSIS — R300 Dysuria: Secondary | ICD-10-CM | POA: Diagnosis not present

## 2019-07-21 DIAGNOSIS — F329 Major depressive disorder, single episode, unspecified: Secondary | ICD-10-CM

## 2019-07-21 DIAGNOSIS — I2510 Atherosclerotic heart disease of native coronary artery without angina pectoris: Secondary | ICD-10-CM

## 2019-07-21 DIAGNOSIS — F32A Depression, unspecified: Secondary | ICD-10-CM

## 2019-07-21 LAB — UA/M W/RFLX CULTURE, ROUTINE
Glucose, UA: NEGATIVE
Ketones, UA: NEGATIVE
Leukocytes,UA: NEGATIVE
Nitrite, UA: NEGATIVE
Protein,UA: NEGATIVE
RBC, UA: NEGATIVE
Specific Gravity, UA: 1.02 (ref 1.005–1.030)
Urobilinogen, Ur: 0.2 mg/dL (ref 0.2–1.0)
pH, UA: 5.5 (ref 5.0–7.5)

## 2019-07-21 MED ORDER — SERTRALINE HCL 100 MG PO TABS: 100.0000 mg | ORAL_TABLET | Freq: Every day | ORAL | 1 refills | Status: DC

## 2019-07-21 NOTE — Progress Notes (Signed)
BP 111/70 (BP Location: Left Arm, Patient Position: Sitting, Cuff Size: Normal)   Pulse 84   Temp 97.6 F (36.4 C) (Oral)   LMP  (LMP Unknown)   SpO2 97%    Subjective:    Patient ID: Melanie Rollins, female    DOB: 1946-03-22, 74 y.o.   MRN: [id number]  HPI: Melanie Rollins is a 74 y.o. female  Chief Complaint  Patient presents with  . Dysuria   URINARY SYMPTOMS Duration: 1-2 weeks Dysuria: no Urinary frequency: yes Urgency: yes Small volume voids: yes Symptom severity: moderate Urinary incontinence: no Foul odor: no Hematuria: no Abdominal pain: no Back pain: no Suprapubic pain/pressure: no Flank pain: no Fever:  no Vomiting: no Relief with cranberry juice: no Relief with pyridium: no Status: stable Previous urinary tract infection: no Recurrent urinary tract infection: no History of sexually transmitted disease: no Penile discharge: no Treatments attempted: increasing fluids   ANXIETY/STRESS Duration:stable Anxious mood: no  Excessive worrying: no Irritability: no  Sweating: no Nausea: no Palpitations:no Hyperventilation: no Panic attacks: no Agoraphobia: no  Obscessions/compulsions: no Depressed mood: no Depression screen [facility] 2/9 07/22/2019 02/04/2019 08/04/2018 07/21/2018 01/13/2018  Decreased Interest 0 0 0 0 0  Down, Depressed, Hopeless 0 0 0 0 0  PHQ - 2 Score 0 0 0 0 0  Altered sleeping - 1 0 - 1  Tired, decreased energy - 3 0 - 1  Change in appetite - 0 0 - 0  Feeling bad or failure about yourself  - 0 0 - 0  Trouble concentrating - 0 0 - 0  Moving slowly or fidgety/restless - 0 0 - 0  Suicidal thoughts - 0 0 - 0  PHQ-9 Score - 4 0 - 2  Difficult doing work/chores - Not difficult at all Not difficult at all - Not difficult at all  Some recent data might be hidden   GAD 7 : Generalized Anxiety Score 01/13/2018 10/28/2017 06/08/2015  Nervous, Anxious, on Edge 0 0 1  Control/stop worrying 0 0 1  Worry too much - different things 0 0 1  Trouble  relaxing 0 0 0  Restless 0 0 0  Easily annoyed or irritable 0 0 0  Afraid - awful might happen 0 0 1  Total GAD 7 Score 0 0 4  Anxiety Difficulty Not difficult at all Not difficult at all -   Anhedonia: no Weight changes: no Insomnia: no   Hypersomnia: no Fatigue/loss of energy: no Feelings of worthlessness: no Feelings of guilt: no Impaired concentration/indecisiveness: no Suicidal ideations: no  Crying spells: no Recent Stressors/Life Changes: no   Relationship problems: no   Family stress: no     Financial stress: no    Job stress: no    Recent death/loss: no   Relevant past medical, surgical, family and social history reviewed and updated as indicated. Interim medical history since our last visit reviewed. Allergies and medications reviewed and updated.  Review of Systems  Constitutional: Negative.   Respiratory: Negative.   Cardiovascular: Negative.   Genitourinary: Positive for frequency and urgency. Negative for decreased urine volume, difficulty urinating, dyspareunia, dysuria, enuresis, flank pain, genital sores, hematuria, menstrual problem, pelvic pain, vaginal bleeding, vaginal discharge and vaginal pain.  Musculoskeletal: Negative.   Neurological: Negative.   Hematological: Negative.   Psychiatric/Behavioral: Negative.     Per HPI unless specifically indicated above     Objective:    BP 111/70 (BP Location: Left Arm, Patient Position: Sitting, Cuff Size: Normal)  Pulse 84   Temp 97.6 F (36.4 C) (Oral)   LMP  (LMP Unknown)   SpO2 97%   Wt Readings from Last 3 Encounters:  02/04/19 124 lb (56.2 kg)  07/21/18 134 lb 6.4 oz (61 kg)  03/25/18 141 lb 3.2 oz (64 kg)    Physical Exam Vitals and nursing note reviewed.  Constitutional:      General: She is not in acute distress.    Appearance: Normal appearance. She is not ill-appearing, toxic-appearing or diaphoretic.  HENT:     Head: Normocephalic and atraumatic.     Right Ear: External ear normal.      Left Ear: External ear normal.     Nose: Nose normal.     Mouth/Throat:     Mouth: Mucous membranes are moist.     Pharynx: Oropharynx is clear.  Eyes:     General: No scleral icterus.       Right eye: No discharge.        Left eye: No discharge.     Extraocular Movements: Extraocular movements intact.     Conjunctiva/sclera: Conjunctivae normal.     Pupils: Pupils are equal, round, and reactive to light.  Cardiovascular:     Rate and Rhythm: Normal rate and regular rhythm.     Pulses: Normal pulses.     Heart sounds: Normal heart sounds. No murmur. No friction rub. No gallop.   Pulmonary:     Effort: Pulmonary effort is normal. No respiratory distress.     Breath sounds: Normal breath sounds. No stridor. No wheezing, rhonchi or rales.  Chest:     Chest wall: No tenderness.  Musculoskeletal:        General: Normal range of motion.     Cervical back: Normal range of motion and neck supple.  Skin:    General: Skin is warm and dry.     Capillary Refill: Capillary refill takes less than 2 seconds.     Coloration: Skin is not jaundiced or pale.     Findings: No bruising, erythema, lesion or rash.  Neurological:     General: No focal deficit present.     Mental Status: She is alert and oriented to person, place, and time. Mental status is at baseline.  Psychiatric:        Mood and Affect: Mood normal.        Behavior: Behavior normal.        Thought Content: Thought content normal.        Judgment: Judgment normal.     Results for orders placed or performed in visit on 07/21/19  UA/M w/rflx Culture, Routine   Specimen: Urine   URINE  Result Value Ref Range   Specific Gravity, UA 1.020 1.005 - 1.030   pH, UA 5.5 5.0 - 7.5   Color, UA Yellow Yellow   Appearance Ur Clear Clear   Leukocytes,UA Negative Negative   Protein,UA Negative Negative/Trace   Glucose, UA Negative Negative   Ketones, UA Negative Negative   RBC, UA Negative Negative   Bilirubin, UA 3+ (A)  Negative   Urobilinogen, Ur 0.2 0.2 - 1.0 mg/dL   Nitrite, UA Negative Negative      Assessment & Plan:   Problem List Items Addressed This Visit      Other   Anxiety and depression    Under good control on current regimen. Continue current regimen. Continue to monitor. Call with any concerns. Refills given.  Relevant Medications   sertraline (ZOLOFT) 100 MG tablet    Other Visit Diagnoses    Dysuria    -  Primary   UA clear. Work on Designer, fashion/clothing and call with any concerns. Continue to monitor.    Relevant Orders   UA/M w/rflx Culture, Routine (Completed)       Follow up plan: Return As scheduled.

## 2019-07-22 ENCOUNTER — Telehealth: Payer: Self-pay | Admitting: Family Medicine

## 2019-07-22 ENCOUNTER — Telehealth: Payer: Medicare Other | Admitting: General Practice

## 2019-07-22 ENCOUNTER — Ambulatory Visit (INDEPENDENT_AMBULATORY_CARE_PROVIDER_SITE_OTHER): Payer: Medicare Other | Admitting: General Practice

## 2019-07-22 DIAGNOSIS — M8000XS Age-related osteoporosis with current pathological fracture, unspecified site, sequela: Secondary | ICD-10-CM

## 2019-07-22 DIAGNOSIS — I2510 Atherosclerotic heart disease of native coronary artery without angina pectoris: Secondary | ICD-10-CM | POA: Diagnosis not present

## 2019-07-22 DIAGNOSIS — E782 Mixed hyperlipidemia: Secondary | ICD-10-CM | POA: Diagnosis not present

## 2019-07-22 DIAGNOSIS — J849 Interstitial pulmonary disease, unspecified: Secondary | ICD-10-CM | POA: Diagnosis not present

## 2019-07-22 DIAGNOSIS — J8410 Pulmonary fibrosis, unspecified: Secondary | ICD-10-CM

## 2019-07-22 DIAGNOSIS — F329 Major depressive disorder, single episode, unspecified: Secondary | ICD-10-CM

## 2019-07-22 DIAGNOSIS — F419 Anxiety disorder, unspecified: Secondary | ICD-10-CM | POA: Diagnosis not present

## 2019-07-22 DIAGNOSIS — I7300 Raynaud's syndrome without gangrene: Secondary | ICD-10-CM | POA: Diagnosis not present

## 2019-07-22 DIAGNOSIS — F32A Depression, unspecified: Secondary | ICD-10-CM

## 2019-07-22 DIAGNOSIS — M359 Systemic involvement of connective tissue, unspecified: Secondary | ICD-10-CM | POA: Diagnosis not present

## 2019-07-22 NOTE — Chronic Care Management (AMB) (Signed)
Chronic Care Management   Initial Visit Note  07/22/2019 Name: Melanie Rollins MRN: [id number] DOB: 1946-01-01  Referred by: Valerie Roys, DO Reason for referral : Chronic Care Management (Initial Outreach: HLD/CAD/Pulmonary Fibrosis/Anxirty/Depression)   Melanie Rollins is a 74 y.o. year old female who is a primary care patient of Valerie Roys, DO. The CCM team was consulted for assistance with chronic disease management and care coordination needs related to CAD, HLD, Anxiety, Depression, Pulmonary Disease and Chronic Hip Pain  Review of patient status, including review of consultants reports, relevant laboratory and other test results, and collaboration with appropriate care team members and the patient's provider was performed as part of comprehensive patient evaluation and provision of chronic care management services.    SDOH (Social Determinants of Health) assessments performed: Yes See Care Plan activities for detailed interventions related to SDOH)  SDOH Interventions     Most Recent Value  SDOH Interventions  SDOH Interventions for the Following Domains  Stress, Physical Activity  Physical Activity Interventions  Other (Comments) [Patient is limited due to her hip issues, hopeful to be more active after her procedure]  Stress Interventions  Other (Comment) [The patient saw pcp on 07/21/2019 and also is taking meds, know LCSW available]  Alcohol Brief Interventions/Follow-up  AUDIT Score <7 follow-up not indicated       Medications: Outpatient Encounter Medications as of 07/22/2019  Medication Sig Note   acetaminophen (TYLENOL) 325 MG tablet Take 650 mg by mouth every 6 (six) hours. 07/21/2018: As needed    Acetylcysteine (NAC) 600 MG CAPS Take 1 capsule (600 mg total) by mouth 2 (two) times daily.    acyclovir ointment (ZOVIRAX) 5 % Apply 1 application topically every 3 (three) hours.    BIOTIN PO Take 5 mcg by mouth daily.     Calcium Citrate-Vitamin D (CALCIUM + D  PO) Take 600 mg by mouth daily.    Cholecalciferol (VITAMIN D) 2000 units CAPS Take 2,000 Units by mouth daily.    CINNAMON PO Take 1,000 mg by mouth daily.    denosumab (PROLIA) 60 MG/ML SOSY injection Inject into the skin.    etodolac (LODINE) 200 MG capsule 2 (two) times daily.     hydroxychloroquine (PLAQUENIL) 200 MG tablet Take 2 tablets (400 mg total) by mouth daily.    L-Lysine 500 MG TABS Take 500 mg by mouth daily.    Multiple Vitamin (MULTIVITAMIN) tablet Take 1 tablet by mouth daily.    mycophenolate (CELLCEPT) 500 MG tablet Take 2 tablets (1,000 mg total) by mouth 2 (two) times daily.    NIFEdipine (PROCARDIA-XL/NIFEDICAL-XL) 30 MG 24 hr tablet Take 30 mg by mouth daily.    Omega-3 Fatty Acids (FISH OIL) 1000 MG CAPS Take 1,000 mg by mouth daily.    omeprazole (PRILOSEC) 20 MG capsule Take 1 capsule (20 mg total) by mouth daily.    OXYGEN 2 L/min by Continuous infusion (non-IV) route daily.    permethrin (ELIMITE) 5 % cream permethrin 5 % topical cream    predniSONE (DELTASONE) 10 MG tablet Take 1 tablet (10 mg total) by mouth daily.    sertraline (ZOLOFT) 100 MG tablet Take 1 tablet (100 mg total) by mouth daily.    sulfamethoxazole-trimethoprim (BACTRIM DS) 800-160 MG tablet Take 1 tablet (160 mg of trimethoprim total) by mouth 3 (three) timesa week    traMADol (ULTRAM) 50 MG tablet Take 50 mg by mouth every 6 (six) hours as needed.    zolpidem (AMBIEN) 10  MG tablet Take 0.5 tablets (5 mg total) by mouth at bedtime.    Magnesium 250 MG TABS Take by mouth.    No facility-administered encounter medications on file as of 07/22/2019.     Objective:   Goals Addressed            This Visit's Progress    RNCM: I am a little stressed out about my upcoming procedure       CARE PLAN ENTRY (see longtitudinal plan of care for additional care plan information)  Current Barriers:   Chronic Disease Management support, education, and care coordination needs  related to CAD, HLD, Anxiety, Depression, Pulmonary Disease, and Chronic hip pain  Clinical Goal(s) related to CAD, HLD, Anxiety, Depression, Pulmonary Disease, and Chronic hip pain :  Over the next 120 days, patient will:   Work with the care management team to address educational, disease management, and care coordination needs   Begin or continue self health monitoring activities as directed today  continue to monitor diet, use sodium as instructed and watch fat content  Call provider office for new or worsened signs and symptoms Oxygen saturation lower than established parameter, Shortness of breath, and New or worsened symptom related to Chronic hip pain and other chronic conditions  Call care management team with questions or concerns  Verbalize basic understanding of patient centered plan of care established today  Interventions related to CAD, HLD, Anxiety, Depression, Pulmonary Disease, and Chronic Hip Pain :   Evaluation of current treatment plans and patient's adherence to plan as established by provider  Assessed patient understanding of disease states  Assessed patient's education and care coordination needs  Provided disease specific education to patient   Collaborated with appropriate clinical care team members regarding patient needs  Pharmacy referral for [facility] pharmacy, if she should take Magnesium and if any of her medications cause blurred vision  Patient Self Care Activities related to CAD, HLD, Anxiety, Depression, Pulmonary Disease, and Chronic Hip Pain :   Patient is unable to independently self-manage chronic health conditions  Initial goal documentation         Melanie Rollins was given information about Chronic Care Management services today including:  1. CCM service includes personalized support from designated clinical staff supervised by her physician, including individualized plan of care and coordination with other care providers 2. 24/7 contact phone  numbers for assistance for urgent and routine care needs. 3. Service will only be billed when office clinical staff spend 20 minutes or more in a month to coordinate care. 4. Only one practitioner may furnish and bill the service in a calendar month. 5. The patient may stop CCM services at any time (effective at the end of the month) by phone call to the office staff. 6. The patient will be responsible for cost sharing (co-pay) of up to 20% of the service fee (after annual deductible is met).  Patient agreed to services and verbal consent obtained.   Plan:   Telephone follow up appointment with care management team member scheduled for: 09/15/2019 at 2 pm  Noreene Larsson RN, MSN, [facility] Mobile: [phone]

## 2019-07-22 NOTE — Patient Instructions (Signed)
Visit Information  Goals Addressed            This Visit's Progress    RNCM: I am a little stressed out about my upcoming procedure       CARE PLAN ENTRY (see longtitudinal plan of care for additional care plan information)  Current Barriers:   Chronic Disease Management support, education, and care coordination needs related to CAD, HLD, Anxiety, Depression, Pulmonary Disease, and Chronic hip pain  Clinical Goal(s) related to CAD, HLD, Anxiety, Depression, Pulmonary Disease, and Chronic hip pain :  Over the next 120 days, patient will:   Work with the care management team to address educational, disease management, and care coordination needs   Begin or continue self health monitoring activities as directed today  continue to monitor diet, use sodium as instructed and watch fat content  Call provider office for new or worsened signs and symptoms Oxygen saturation lower than established parameter, Shortness of breath, and New or worsened symptom related to Chronic hip pain and other chronic conditions  Call care management team with questions or concerns  Verbalize basic understanding of patient centered plan of care established today  Interventions related to CAD, HLD, Anxiety, Depression, Pulmonary Disease, and Chronic Hip Pain :   Evaluation of current treatment plans and patient's adherence to plan as established by provider  Assessed patient understanding of disease states  Assessed patient's education and care coordination needs  Provided disease specific education to patient   Collaborated with appropriate clinical care team members regarding patient needs  Pharmacy referral for [facility] pharmacy, if she should take Magnesium and if any of her medications cause blurred vision  Patient Self Care Activities related to CAD, HLD, Anxiety, Depression, Pulmonary Disease, and Chronic Hip Pain :   Patient is unable to independently self-manage chronic health  conditions  Initial goal documentation        Melanie Rollins was given information about Chronic Care Management services today including:  1. CCM service includes personalized support from designated clinical staff supervised by her physician, including individualized plan of care and coordination with other care providers 2. 24/7 contact phone numbers for assistance for urgent and routine care needs. 3. Service will only be billed when office clinical staff spend 20 minutes or more in a month to coordinate care. 4. Only one practitioner may furnish and bill the service in a calendar month. 5. The patient may stop CCM services at any time (effective at the end of the month) by phone call to the office staff. 6. The patient will be responsible for cost sharing (co-pay) of up to 20% of the service fee (after annual deductible is met).  Patient agreed to services and verbal consent obtained.   Patient verbalizes understanding of instructions provided today.   Telephone follow up appointment with care management team member scheduled for: 09/15/2019  Noreene Larsson RN, MSN, [facility] Mobile: [phone]

## 2019-07-22 NOTE — Chronic Care Management (AMB) (Signed)
  Chronic Care Management   Note  07/22/2019 Name: Melanie Rollins MRN: [id number] DOB: 12-24-1945  Melanie Rollins is a 74 y.o. year old female who is a primary care patient of Valerie Roys, DO. Melanie Rollins is currently enrolled in care management services. An additional referral for PharmD was placed.   Follow up plan: Telephone appointment with care management team member scheduled for: 09/01/2019  Noreene Larsson, [place], [place], [place] [address] Direct Dial: [phone] [email or web address] Website: [email or web address]

## 2019-07-25 ENCOUNTER — Encounter: Payer: Self-pay | Admitting: Family Medicine

## 2019-07-25 NOTE — Assessment & Plan Note (Signed)
Under good control on current regimen. Continue current regimen. Continue to monitor. Call with any concerns. Refills given.

## 2019-07-27 DIAGNOSIS — I7300 Raynaud's syndrome without gangrene: Secondary | ICD-10-CM | POA: Diagnosis not present

## 2019-07-27 DIAGNOSIS — R768 Other specified abnormal immunological findings in serum: Secondary | ICD-10-CM | POA: Diagnosis not present

## 2019-07-27 DIAGNOSIS — R899 Unspecified abnormal finding in specimens from other organs, systems and tissues: Secondary | ICD-10-CM | POA: Diagnosis not present

## 2019-07-27 DIAGNOSIS — M359 Systemic involvement of connective tissue, unspecified: Secondary | ICD-10-CM | POA: Diagnosis not present

## 2019-07-27 DIAGNOSIS — J849 Interstitial pulmonary disease, unspecified: Secondary | ICD-10-CM | POA: Diagnosis not present

## 2019-07-27 DIAGNOSIS — Z961 Presence of intraocular lens: Secondary | ICD-10-CM | POA: Diagnosis not present

## 2019-07-27 DIAGNOSIS — D649 Anemia, unspecified: Secondary | ICD-10-CM | POA: Diagnosis not present

## 2019-07-28 DIAGNOSIS — M25551 Pain in right hip: Secondary | ICD-10-CM | POA: Diagnosis not present

## 2019-07-28 DIAGNOSIS — I7300 Raynaud's syndrome without gangrene: Secondary | ICD-10-CM | POA: Diagnosis not present

## 2019-07-28 DIAGNOSIS — M359 Systemic involvement of connective tissue, unspecified: Secondary | ICD-10-CM | POA: Diagnosis not present

## 2019-07-28 DIAGNOSIS — D649 Anemia, unspecified: Secondary | ICD-10-CM | POA: Diagnosis not present

## 2019-07-28 DIAGNOSIS — J849 Interstitial pulmonary disease, unspecified: Secondary | ICD-10-CM | POA: Diagnosis not present

## 2019-07-28 DIAGNOSIS — M810 Age-related osteoporosis without current pathological fracture: Secondary | ICD-10-CM | POA: Diagnosis not present

## 2019-08-04 DIAGNOSIS — M359 Systemic involvement of connective tissue, unspecified: Secondary | ICD-10-CM | POA: Diagnosis not present

## 2019-08-04 DIAGNOSIS — D649 Anemia, unspecified: Secondary | ICD-10-CM | POA: Diagnosis not present

## 2019-08-04 DIAGNOSIS — R899 Unspecified abnormal finding in specimens from other organs, systems and tissues: Secondary | ICD-10-CM | POA: Diagnosis not present

## 2019-08-05 DIAGNOSIS — M25551 Pain in right hip: Secondary | ICD-10-CM | POA: Diagnosis not present

## 2019-08-07 ENCOUNTER — Other Ambulatory Visit: Payer: Self-pay | Admitting: Family Medicine

## 2019-08-10 ENCOUNTER — Other Ambulatory Visit: Payer: Self-pay | Admitting: Family Medicine

## 2019-08-20 DIAGNOSIS — M368 Systemic disorders of connective tissue in other diseases classified elsewhere: Secondary | ICD-10-CM | POA: Diagnosis not present

## 2019-08-20 DIAGNOSIS — R195 Other fecal abnormalities: Secondary | ICD-10-CM | POA: Diagnosis not present

## 2019-08-20 DIAGNOSIS — R0602 Shortness of breath: Secondary | ICD-10-CM | POA: Diagnosis not present

## 2019-08-20 DIAGNOSIS — D649 Anemia, unspecified: Secondary | ICD-10-CM | POA: Diagnosis not present

## 2019-08-20 DIAGNOSIS — J8410 Pulmonary fibrosis, unspecified: Secondary | ICD-10-CM | POA: Diagnosis not present

## 2019-08-20 DIAGNOSIS — J984 Other disorders of lung: Secondary | ICD-10-CM | POA: Diagnosis not present

## 2019-08-20 DIAGNOSIS — I2510 Atherosclerotic heart disease of native coronary artery without angina pectoris: Secondary | ICD-10-CM | POA: Diagnosis not present

## 2019-08-24 ENCOUNTER — Ambulatory Visit (INDEPENDENT_AMBULATORY_CARE_PROVIDER_SITE_OTHER): Payer: Medicare Other

## 2019-08-24 ENCOUNTER — Other Ambulatory Visit: Payer: Self-pay

## 2019-08-24 ENCOUNTER — Ambulatory Visit (INDEPENDENT_AMBULATORY_CARE_PROVIDER_SITE_OTHER): Payer: Medicare Other | Admitting: Family Medicine

## 2019-08-24 ENCOUNTER — Encounter: Payer: Self-pay | Admitting: Family Medicine

## 2019-08-24 VITALS — BP 106/54 | HR 62 | Temp 98.0°F | Ht 63.0 in

## 2019-08-24 VITALS — BP 106/54 | HR 62 | Temp 98.0°F | Ht 63.0 in | Wt 124.9 lb

## 2019-08-24 DIAGNOSIS — M8000XS Age-related osteoporosis with current pathological fracture, unspecified site, sequela: Secondary | ICD-10-CM | POA: Diagnosis not present

## 2019-08-24 DIAGNOSIS — R748 Abnormal levels of other serum enzymes: Secondary | ICD-10-CM | POA: Diagnosis not present

## 2019-08-24 DIAGNOSIS — J9611 Chronic respiratory failure with hypoxia: Secondary | ICD-10-CM | POA: Diagnosis not present

## 2019-08-24 DIAGNOSIS — E782 Mixed hyperlipidemia: Secondary | ICD-10-CM

## 2019-08-24 DIAGNOSIS — Z0000 Encounter for general adult medical examination without abnormal findings: Secondary | ICD-10-CM

## 2019-08-24 DIAGNOSIS — F329 Major depressive disorder, single episode, unspecified: Secondary | ICD-10-CM

## 2019-08-24 DIAGNOSIS — F419 Anxiety disorder, unspecified: Secondary | ICD-10-CM

## 2019-08-24 DIAGNOSIS — M1990 Unspecified osteoarthritis, unspecified site: Secondary | ICD-10-CM

## 2019-08-24 DIAGNOSIS — R195 Other fecal abnormalities: Secondary | ICD-10-CM | POA: Diagnosis not present

## 2019-08-24 DIAGNOSIS — R8281 Pyuria: Secondary | ICD-10-CM | POA: Diagnosis not present

## 2019-08-24 DIAGNOSIS — I2510 Atherosclerotic heart disease of native coronary artery without angina pectoris: Secondary | ICD-10-CM | POA: Diagnosis not present

## 2019-08-24 DIAGNOSIS — J984 Other disorders of lung: Secondary | ICD-10-CM | POA: Diagnosis not present

## 2019-08-24 DIAGNOSIS — M368 Systemic disorders of connective tissue in other diseases classified elsewhere: Secondary | ICD-10-CM | POA: Diagnosis not present

## 2019-08-24 DIAGNOSIS — D500 Iron deficiency anemia secondary to blood loss (chronic): Secondary | ICD-10-CM

## 2019-08-24 DIAGNOSIS — J8410 Pulmonary fibrosis, unspecified: Secondary | ICD-10-CM | POA: Diagnosis not present

## 2019-08-24 DIAGNOSIS — Z1231 Encounter for screening mammogram for malignant neoplasm of breast: Secondary | ICD-10-CM | POA: Diagnosis not present

## 2019-08-24 MED ORDER — MUPIROCIN 2 % EX OINT: 1.0000 "application " | TOPICAL_OINTMENT | Freq: Two times a day (BID) | CUTANEOUS | 3 refills | Status: AC

## 2019-08-24 MED ORDER — BACLOFEN 10 MG PO TABS: 10.0000 mg | ORAL_TABLET | Freq: Every evening | ORAL | 0 refills | Status: AC | PRN

## 2019-08-24 MED ORDER — OMEPRAZOLE 20 MG PO CPDR: 20.0000 mg | DELAYED_RELEASE_CAPSULE | Freq: Every day | ORAL | 1 refills | Status: DC

## 2019-08-24 NOTE — Patient Instructions (Signed)
Melanie Rollins , Thank you for taking time to come for your Medicare Wellness Visit. I appreciate your ongoing commitment to your health goals. Please review the following plan we discussed and let me know if I can assist you in the future.   Screening recommendations/referrals: Colonoscopy: scheduled  Mammogram: completed 10/29/2018 Please call [phone] to schedule your mammogram.  Bone Density: completed 10/29/2018 Recommended yearly ophthalmology/optometry visit for glaucoma screening and checkup Recommended yearly dental visit for hygiene and checkup  Vaccinations: Influenza vaccine: up to date Pneumococcal vaccine: up to date  Tdap vaccine: due now  Shingles vaccine: shingrix completed    Covid-19: completed series   Advanced directives: copy on file  Conditions/risks identified: none   Next appointment: Follow up in one year for your annual wellness visit    Preventive Care 25 Years and Older, Female Preventive care refers to lifestyle choices and visits with your health care provider that can promote health and wellness. What does preventive care include?  A yearly physical exam. This is also called an annual well check.  Dental exams once or twice a year.  Routine eye exams. Ask your health care provider how often you should have your eyes checked.  Personal lifestyle choices, including:  Daily care of your teeth and gums.  Regular physical activity.  Eating a healthy diet.  Avoiding tobacco and drug use.  Limiting alcohol use.  Practicing safe sex.  Taking low-dose aspirin every day.  Taking vitamin and mineral supplements as recommended by your health care provider. What happens during an annual well check? The services and screenings done by your health care provider during your annual well check will depend on your age, overall health, lifestyle risk factors, and family history of disease. Counseling  Your health care provider may ask you questions  about your:  Alcohol use.  Tobacco use.  Drug use.  Emotional well-being.  Home and relationship well-being.  Sexual activity.  Eating habits.  History of falls.  Memory and ability to understand (cognition).  Work and work Statistician.  Reproductive health. Screening  You may have the following tests or measurements:  Height, weight, and BMI.  Blood pressure.  Lipid and cholesterol levels. These may be checked every 5 years, or more frequently if you are over 35 years old.  Skin check.  Lung cancer screening. You may have this screening every year starting at age 78 if you have a 30-pack-year history of smoking and currently smoke or have quit within the past 15 years.  Fecal occult blood test (FOBT) of the stool. You may have this test every year starting at age 78.  Flexible sigmoidoscopy or colonoscopy. You may have a sigmoidoscopy every 5 years or a colonoscopy every 10 years starting at age 61.  Hepatitis C blood test.  Hepatitis B blood test.  Sexually transmitted disease (STD) testing.  Diabetes screening. This is done by checking your blood sugar (glucose) after you have not eaten for a while (fasting). You may have this done every 1-3 years.  Bone density scan. This is done to screen for osteoporosis. You may have this done starting at age 64.  Mammogram. This may be done every 1-2 years. Talk to your health care provider about how often you should have regular mammograms. Talk with your health care provider about your test results, treatment options, and if necessary, the need for more tests. Vaccines  Your health care provider may recommend certain vaccines, such as:  Influenza vaccine. This is recommended  every year.  Tetanus, diphtheria, and acellular pertussis (Tdap, Td) vaccine. You may need a Td booster every 10 years.  Zoster vaccine. You may need this after age 52.  Pneumococcal 13-valent conjugate (PCV13) vaccine. One dose is  recommended after age 2.  Pneumococcal polysaccharide (PPSV23) vaccine. One dose is recommended after age 62. Talk to your health care provider about which screenings and vaccines you need and how often you need them. This information is not intended to replace advice given to you by your health care provider. Make sure you discuss any questions you have with your health care provider. Document Released: 06/03/2015 Document Revised: 01/25/2016 Document Reviewed: 03/08/2015 Elsevier Interactive Patient Education  2017 [employer] Prevention in the Home Falls can cause injuries. They can happen to people of all ages. There are many things you can do to make your home safe and to help prevent falls. What can I do on the outside of my home?  Regularly fix the edges of walkways and driveways and fix any cracks.  Remove anything that might make you trip as you walk through a door, such as a raised step or threshold.  Trim any bushes or trees on the path to your home.  Use bright outdoor lighting.  Clear any walking paths of anything that might make someone trip, such as rocks or tools.  Regularly check to see if handrails are loose or broken. Make sure that both sides of any steps have handrails.  Any raised decks and porches should have guardrails on the edges.  Have any leaves, snow, or ice cleared regularly.  Use sand or salt on walking paths during winter.  Clean up any spills in your garage right away. This includes oil or grease spills. What can I do in the bathroom?  Use night lights.  Install grab bars by the toilet and in the tub and shower. Do not use towel bars as grab bars.  Use non-skid mats or decals in the tub or shower.  If you need to sit down in the shower, use a plastic, non-slip stool.  Keep the floor dry. Clean up any water that spills on the floor as soon as it happens.  Remove soap buildup in the tub or shower regularly.  Attach bath mats  securely with double-sided non-slip rug tape.  Do not have throw rugs and other things on the floor that can make you trip. What can I do in the bedroom?  Use night lights.  Make sure that you have a light by your bed that is easy to reach.  Do not use any sheets or blankets that are too big for your bed. They should not hang down onto the floor.  Have a firm chair that has side arms. You can use this for support while you get dressed.  Do not have throw rugs and other things on the floor that can make you trip. What can I do in the kitchen?  Clean up any spills right away.  Avoid walking on wet floors.  Keep items that you use a lot in easy-to-reach places.  If you need to reach something above you, use a strong step stool that has a grab bar.  Keep electrical cords out of the way.  Do not use floor polish or wax that makes floors slippery. If you must use wax, use non-skid floor wax.  Do not have throw rugs and other things on the floor that can make you trip. What  can I do with my stairs?  Do not leave any items on the stairs.  Make sure that there are handrails on both sides of the stairs and use them. Fix handrails that are broken or loose. Make sure that handrails are as long as the stairways.  Check any carpeting to make sure that it is firmly attached to the stairs. Fix any carpet that is loose or worn.  Avoid having throw rugs at the top or bottom of the stairs. If you do have throw rugs, attach them to the floor with carpet tape.  Make sure that you have a light switch at the top of the stairs and the bottom of the stairs. If you do not have them, ask someone to add them for you. What else can I do to help prevent falls?  Wear shoes that:  Do not have high heels.  Have rubber bottoms.  Are comfortable and fit you well.  Are closed at the toe. Do not wear sandals.  If you use a stepladder:  Make sure that it is fully opened. Do not climb a closed  stepladder.  Make sure that both sides of the stepladder are locked into place.  Ask someone to hold it for you, if possible.  Clearly mark and make sure that you can see:  Any grab bars or handrails.  First and last steps.  Where the edge of each step is.  Use tools that help you move around (mobility aids) if they are needed. These include:  Canes.  Walkers.  Scooters.  Crutches.  Turn on the lights when you go into a dark area. Replace any light bulbs as soon as they burn out.  Set up your furniture so you have a clear path. Avoid moving your furniture around.  If any of your floors are uneven, fix them.  If there are any pets around you, be aware of where they are.  Review your medicines with your doctor. Some medicines can make you feel dizzy. This can increase your chance of falling. Ask your doctor what other things that you can do to help prevent falls. This information is not intended to replace advice given to you by your health care provider. Make sure you discuss any questions you have with your health care provider. Document Released: 03/03/2009 Document Revised: 10/13/2015 Document Reviewed: 06/11/2014 Elsevier Interactive Patient Education  2017 [employer].

## 2019-08-24 NOTE — Progress Notes (Signed)
Subjective:   Melanie Rollins is a 74 y.o. female who presents for Medicare Annual (Subsequent) preventive examination.  Review of Systems:   Cardiac Risk Factors include: advanced age (>54mn, >>58women);dyslipidemia;hypertension     Objective:     Vitals: BP (!) 106/54 (BP Location: Left Arm, Patient Position: Sitting, Cuff Size: Normal)   Pulse 62   Temp 98 F (36.7 C) (Temporal)   Ht _0  (1.6 m)   Wt 124 lb 14.4 oz (56.7 kg)   LMP  (LMP Unknown)   SpO2 97%   BMI 22.13 kg/m   Body mass index is 22.13 kg/m.  Advanced Directives 08/24/2019 07/21/2018 03/25/2018 03/21/2018 03/10/2018 03/10/2018 03/09/2018  Does Patient Have a Medical Advance Directive? Yes Yes No No Yes Yes Yes  Type of Advance Directive Living will;Healthcare Power of Attorney Living will;Healthcare Power of Attorney - - - Living will;Healthcare Power of Attorney Living will;Healthcare Power of Attorney  Does patient want to make changes to medical advance directive? - - No - Patient declined No - Patient declined - No - Patient declined No - Patient declined  Copy of [place] Chart? Yes - validated most recent copy scanned in chart (See row information) Yes - validated most recent copy scanned in chart (See row information) - - - No - copy requested No - copy requested  Would patient like information on creating a medical advance directive? - - No - Patient declined No - Patient declined - No - Patient declined No - Patient declined    Tobacco Social History   Tobacco Use  Smoking Status Never Smoker  Smokeless Tobacco Never Used     Counseling given: Not Answered   Clinical Intake:  Pre-visit preparation completed: No  Pain : 0-10 Pain Score: 4  Pain Type: Chronic pain Pain Location: Hip Pain Orientation: Right Pain Descriptors / Indicators: Aching Pain Onset: More than a month ago Pain Frequency: Constant     Nutritional Risks: None Diabetes: No  How often do you  need to have someone help you when you read instructions, pamphlets, or other written materials from your doctor or pharmacy?: 1 - Never  Interpreter Needed?: No  Information entered by :: Dreamer Carillo,LPN  Past Medical History:  Diagnosis Date  . Arthritis    RA  . [place]  . [facility] fracture [facility]    left hip   . Hyperlipidemia   . Nephrolithiasis 03/14/2015  . [place] [place]   . [place] syndrome [facility]    Past Surgical History:  Procedure Laterality Date  . INTRAMEDULLARY (IM) NAIL INTERTROCHANTERIC Left 03/10/2018   Procedure: INTRAMEDULLARY (IM) NAIL INTERTROCHANTRIC-LEFT TFNA;  Surgeon: BLovell Sheehan MD;  Location: [facility] ORS;  Service: Orthopedics;  Laterality: Left;  . kidney stones    . LUNG BIOPSY    . SQUAMOUS CELL CARCINOMA EXCISION Left    shin of leg   Family History  Problem Relation Age of Onset  . Hypertension Mother   . Heart disease Mother        pacemaker and CHF  . Glaucoma Mother   . Hypertension Brother   . Arthritis Sister   . Heart disease Maternal Grandmother   . Heart disease Maternal Grandfather   . Hypertension Paternal Grandmother   . Stroke Paternal Grandmother   . Cancer Paternal Grandfather    Social History   Socioeconomic History  . Marital status: Married  Spouse name: Not on file  . Number of children: Not on file  . Years of education: Not on file  . Highest education level: Associate degree: academic program  Occupational History  . Occupation: part time clerical work   Tobacco Use  . Smoking status: Never Smoker  . Smokeless tobacco: Never Used  Substance and Sexual Activity  . Alcohol use: Yes    Alcohol/week: 0.0 standard drinks    Comment: pt states she drinks wine on occasion  . Drug use: No  . Sexual activity: Yes  Other Topics Concern  . Not on file  Social History Narrative   In investment club   Working part time   In lady sorority meet  once a month   Stitching group meets couple times a month    Attends church    Social Determinants of Radio broadcast assistant Strain: Low Risk   . Difficulty of Paying Living Expenses: Not hard at all  Food Insecurity: No Food Insecurity  . Worried About Charity fundraiser in the Last Year: Never true  . Ran Out of Food in the Last Year: Never true  Transportation Needs: No Transportation Needs  . Lack of Transportation (Medical): No  . Lack of Transportation (Non-Medical): No  Physical Activity: Inactive  . Days of Exercise per Week: 0 days  . Minutes of Exercise per Session: 0 min  Stress: Stress Concern Present  . Feeling of Stress : To some extent  Social Connections: Not Isolated  . Frequency of Communication with Friends and Family: More than three times a week  . Frequency of Social Gatherings with Friends and Family: More than three times a week  . Attends Religious Services: More than 4 times per year  . Active Member of Clubs or Organizations: Yes  . Attends Archivist Meetings: More than 4 times per year  . Marital Status: Married    Outpatient Encounter Medications as of 08/24/2019  Medication Sig  . acetaminophen (TYLENOL) 325 MG tablet Take 650 mg by mouth every 6 (six) hours.  . Acetylcysteine (NAC) 600 MG CAPS Take 1 capsule (600 mg total) by mouth 2 (two) times daily.  Marland Kitchen acyclovir ointment (ZOVIRAX) 5 % Apply 1 application topically every 3 (three) hours.  Marland Kitchen BIOTIN PO Take 5 mcg by mouth daily.   . Calcium Citrate-Vitamin D (CALCIUM + D PO) Take 600 mg by mouth daily.  . Cholecalciferol (VITAMIN D) 2000 units CAPS Take 2,000 Units by mouth daily.  Marland Kitchen CINNAMON PO Take 1,000 mg by mouth daily.  Marland Kitchen denosumab (PROLIA) 60 MG/ML SOSY injection Inject into the skin.  . hydroxychloroquine (PLAQUENIL) 200 MG tablet Take 2 tablets (400 mg total) by mouth daily.  Marland Kitchen L-Lysine 500 MG TABS Take 500 mg by mouth daily.  . Magnesium 250 MG TABS Take by mouth.  .  Multiple Vitamin (MULTIVITAMIN) tablet Take 1 tablet by mouth daily.  . mycophenolate (CELLCEPT) 500 MG tablet Take 2 tablets (1,000 mg total) by mouth 2 (two) times daily.  Marland Kitchen NIFEdipine (PROCARDIA-XL/NIFEDICAL-XL) 30 MG 24 hr tablet Take 30 mg by mouth daily.  . Omega-3 Fatty Acids (FISH OIL) 1000 MG CAPS Take 1,000 mg by mouth daily.  Marland Kitchen omeprazole (PRILOSEC) 20 MG capsule Take 1 capsule (20 mg total) by mouth daily.  . OXYGEN 2 L/min by Continuous infusion (non-IV) route daily.  . permethrin (ELIMITE) 5 % cream permethrin 5 % topical cream  . predniSONE (DELTASONE) 10 MG tablet Take  1 tablet (10 mg total) by mouth daily.  . sertraline (ZOLOFT) 100 MG tablet Take 1 tablet (100 mg total) by mouth daily.  Marland Kitchen sulfamethoxazole-trimethoprim (BACTRIM DS) 800-160 MG tablet Take 1 tablet (160 mg of trimethoprim total) by mouth 3 (three) timesa week  . traMADol (ULTRAM) 50 MG tablet Take 50 mg by mouth every 6 (six) hours as needed.  . zolpidem (AMBIEN) 10 MG tablet Take 0.5 tablets (5 mg total) by mouth at bedtime.  Marland Kitchen etodolac (LODINE) 200 MG capsule 2 (two) times daily.    No facility-administered encounter medications on file as of 08/24/2019.    Activities of Daily Living In your present state of health, do you have any difficulty performing the following activities: 08/24/2019  Hearing? N  Comment left ear diffliculty no hearing aids  Vision? N  Comment eyeglasses, Dr.Jackson annually  Difficulty concentrating or making decisions? N  Walking or climbing stairs? Y  Comment hip pain  Dressing or bathing? N  Doing errands, shopping? N  Preparing Food and eating ? N  Using the Toilet? N  In the past six months, have you accidently leaked urine? Y  Comment depends for protection  Do you have problems with loss of bowel control? Y  Comment depends for protection, does have some incontinence issues sometimes.  Managing your Medications? N  Managing your Finances? N  Housekeeping or managing your  Housekeeping? Y  Comment husband helps with some  Some recent data might be hidden    Patient Care Team: Valerie Roys, DO as PCP - General (Family Medicine) Emmaline Kluver., MD (Rheumatology) Charlie Pitter, MD as Referring Physician (Pulmonary Disease) Ubaldo Glassing Javier Docker, MD as Consulting Physician (Cardiology) Lovell Sheehan, MD as Consulting Physician (Orthopedic Surgery) Jannet Mantis, MD (Dermatology) Vanita Ingles, RN as Case Manager (Lead Daelyn Pettaway) De Hollingshead, [facility] (Pharmacist)    Assessment:   This is a routine wellness examination for [place].  Exercise Activities and Dietary recommendations Current Exercise Habits: Home exercise routine, Type of exercise: stretching;strength training/weights, Time (Minutes): 15, Frequency (Times/Week): 7, Weekly Exercise (Minutes/Week): 105, Intensity: Mild, Exercise limited by: None identified  Goals Addressed   None     Fall Risk: Fall Risk  08/24/2019 07/21/2018 01/13/2018 07/15/2017 07/06/2016  Falls in the past year? 0 1 No No No  Number falls in past yr: 0 0 - - -  Injury with Fall? 0 1 - - -    FALL RISK PREVENTION PERTAINING TO THE HOME:  Any stairs in or around the home? Yes  If so, are there any without handrails? No   Home free of loose throw rugs in walkways, pet beds, electrical cords, etc? Yes  Adequate lighting in your home to reduce risk of falls? Yes   ASSISTIVE DEVICES UTILIZED TO PREVENT FALLS:  Life alert? No  Use of a cane, walker or w/c? Yes  Grab bars in the bathroom? Yes  Shower chair or bench in shower? Yes  Elevated toilet seat or a handicapped toilet? Yes   DME ORDERS:  DME order needed?  No   TIMED UP AND GO:  Was the test performed? Yes .  Length of time to ambulate 10 feet: 8 sec.   GAIT:  Appearance of gait: Gait steady and fast without the use of an assistive device.   Education: Fall risk prevention has been discussed.  Intervention(s) required? No     DME/home health order needed?  No    Depression  Screen PHQ 2/9 Scores 08/24/2019 07/22/2019 02/04/2019 08/04/2018  PHQ - 2 Score 0 0 0 0  PHQ- 9 Score - - 4 0     Cognitive Function     6CIT Screen 08/24/2019 07/21/2018 07/15/2017  What Year? 0 points 0 points 0 points  What month? 0 points 0 points 0 points  What time? 0 points 0 points 0 points  Count back from 20 0 points 0 points 0 points  Months in reverse 0 points 0 points 0 points  Repeat phrase 0 points 0 points 0 points  Total Score 0 0 0    Immunization History  Administered Date(s) Administered  . Influenza, High Dose Seasonal PF 01/19/2018  . Influenza-Unspecified 02/17/2015, 03/09/2016, 02/07/2017, 02/08/2019  . PFIZER SARS-COV-2 Vaccination 06/30/2019, 07/21/2019  . Pneumococcal Conjugate-13 07/02/2014  . Pneumococcal Polysaccharide-23 07/04/2015  . Td 03/16/2009  . Zoster 07/06/2016  . Zoster Recombinat (Shingrix) 07/11/2016, 10/29/2016    Qualifies for Shingles Vaccine? Shingrix completed   Tdap: Discussed need for TD/TDAP vaccine, patient verbalized understanding that this is not covered as a preventative with there insurance and to call the office if she develops any new skin injuries, ie: cuts, scrapes, bug bites, or open wounds.  Flu Vaccine: up to date   Pneumococcal Vaccine: Dup to date   Covid-19 Vaccine:  Completed vaccines  Screening Tests Health Maintenance  Topic Date Due  . TETANUS/TDAP  03/17/2019  . COLONOSCOPY  08/18/2019  . INFLUENZA VACCINE  12/20/2019  . MAMMOGRAM  10/28/2020  . DEXA SCAN  10/28/2020  . Hepatitis C Screening  Completed  . PNA vac Low Risk Adult  Completed    Cancer Screenings:  Colorectal Screening: scheduled for June 2nd   Mammogram: Completed 10/29/2018  Bone Density: Completed 10/29/2018  Lung Cancer Screening: (Low Dose CT Chest recommended if Age 30-80 years, 30 pack-year currently smoking OR have quit w/in 15years.) does not qualify.    Additional  Screening:  Hepatitis C Screening: does qualify; Completed 2019  Vision Screening: Recommended annual ophthalmology exams for early detection of glaucoma and other disorders of the eye. Is the patient up to date with their annual eye exam?  Yes  Who is the provider or what is the name of the office in which the pt attends annual eye exams? Dr.Jackson    Dental Screening: Recommended annual dental exams for proper oral hygiene  Community Resource Referral:  CRR required this visit?  No       Plan:  I have personally reviewed and addressed the Medicare Annual Wellness questionnaire and have noted the following in the patient's chart:  A. Medical and social history B. Use of alcohol, tobacco or illicit drugs  C. Current medications and supplements D. Functional ability and status E.  Nutritional status F.  Physical activity G. Advance directives H. List of other physicians I.  Hospitalizations, surgeries, and ER visits in previous 12 months J.  [place] such as hearing and vision if needed, cognitive and depression L. Referrals and appointments   In addition, I have reviewed and discussed with patient certain preventive protocols, quality metrics, and best practice recommendations. A written personalized care plan for preventive services as well as general preventive health recommendations were provided to patient.  Signed,    Bevelyn Ngo, LPN  11/21/812 Nurse Health Advisor   Nurse Notes: none

## 2019-08-24 NOTE — Assessment & Plan Note (Signed)
Under good control on current regimen. Continue current regimen. Continue to monitor. Call with any concerns. Refills given. Labs drawn today.

## 2019-08-24 NOTE — Assessment & Plan Note (Signed)
Under good control on current regimen. Continue current regimen. Continue to monitor. Call with any concerns. Refills given.   

## 2019-08-24 NOTE — Assessment & Plan Note (Signed)
Worsening. May need surgery. Continue to follow with ortho. Call with any concerns.

## 2019-08-24 NOTE — Progress Notes (Signed)
BP (!) 106/54 (BP Location: Left Arm, Patient Position: Sitting, Cuff Size: Normal)   Pulse 62   Temp 98 F (36.7 C) (Temporal)   Ht 5' 3" (1.6 m)   LMP  (LMP Unknown)   SpO2 97%   BMI 22.13 kg/m    Subjective:    Patient ID: Melanie Rollins, female    DOB: 1946-04-09, 74 y.o.   MRN: [id number]  HPI: Melanie Rollins is a 74 y.o. female  Chief Complaint  Patient presents with  . Hyperlipidemia  . Depression   Having a lot of pain. May have to have a hip replacement. Has been following with ortho. She had an epidural steroid shot about 3 weeks ago and feels like it's helped a bit, but not 100%.   ANEMIA- needs colonoscopy and EGD, can't get one at [facility] until 6/2, she's been feeling worse and is very worries about waiting that long Anemia status: worse Etiology of anemia: iron def, heme + stool Duration of anemia treatment: weeks Compliance with treatment: excellent compliance Iron supplementation side effects: no Severity of anemia: moderate Fatigue: yes Decreased exercise tolerance: yes  Dyspnea on exertion: yes Palpitations: no Bleeding: no Pica: no  HYPERLIPIDEMIA Hyperlipidemia status: excellent compliance Satisfied with current treatment?  yes Side effects:  no Medication compliance: excellent compliance Past cholesterol meds: none Supplements: fish oil Aspirin:  no The 10-year ASCVD risk score Mikey Bussing DC Jr., et al., 2013) is: 8.1%   Values used to calculate the score:     Age: 83 years     Sex: Female     Is Non-Hispanic African American: No     Diabetic: No     Tobacco smoker: No     Systolic Blood Pressure: 379 mmHg     Is BP treated: No     HDL Cholesterol: 78 mg/dL     Total Cholesterol: 219 mg/dL Chest pain:  no Coronary artery disease:  no  DEPRESSION Mood status: controlled Satisfied with current treatment?: yes Symptom severity: mild  Duration of current treatment : chronic Side effects: no Medication compliance: excellent  compliance Psychotherapy/counseling: no  Previous psychiatric medications: zoloft Depressed mood: no Anxious mood: no Anhedonia: no Significant weight loss or gain: no Insomnia: no  Fatigue: yes Feelings of worthlessness or guilt: no Impaired concentration/indecisiveness: no Suicidal ideations: no Hopelessness: no Crying spells: no Depression screen [facility] 2/9 08/24/2019 08/24/2019 07/22/2019 02/04/2019 08/04/2018  Decreased Interest 0 0 0 0 0  Down, Depressed, Hopeless 0 0 0 0 0  PHQ - 2 Score 0 0 0 0 0  Altered sleeping 2 - - 1 0  Tired, decreased energy 2 - - 3 0  Change in appetite 2 - - 0 0  Feeling bad or failure about yourself  0 - - 0 0  Trouble concentrating 0 - - 0 0  Moving slowly or fidgety/restless 0 - - 0 0  Suicidal thoughts 0 - - 0 0  PHQ-9 Score 6 - - 4 0  Difficult doing work/chores Somewhat difficult - - Not difficult at all Not difficult at all  Some recent data might be hidden   GAD 7 : Generalized Anxiety Score 08/24/2019 01/13/2018 10/28/2017 06/08/2015  Nervous, Anxious, on Edge 1 0 0 1  Control/stop worrying 0 0 0 1  Worry too much - different things 0 0 0 1  Trouble relaxing 0 0 0 0  Restless 0 0 0 0  Easily annoyed or irritable 0 0 0 0  Afraid - awful might happen 1 0 0 1  Total GAD 7 Score 2 0 0 4  Anxiety Difficulty Not difficult at all Not difficult at all Not difficult at all -    Relevant past medical, surgical, family and social history reviewed and updated as indicated. Interim medical history since our last visit reviewed. Allergies and medications reviewed and updated.  Review of Systems  Constitutional: Negative.   HENT: Negative.   Respiratory: Positive for chest tightness, shortness of breath and wheezing. Negative for apnea, cough, choking and stridor.   Cardiovascular: Negative.   Gastrointestinal: Negative.   Endocrine: Negative.   Genitourinary: Negative.   Musculoskeletal: Positive for arthralgias, back pain, gait problem and myalgias.  Negative for joint swelling, neck pain and neck stiffness.  Skin: Negative.   Neurological: Positive for dizziness and light-headedness. Negative for tremors, seizures, syncope, facial asymmetry, speech difficulty, weakness, numbness and headaches.  Psychiatric/Behavioral: Negative.     Per HPI unless specifically indicated above     Objective:    BP (!) 106/54 (BP Location: Left Arm, Patient Position: Sitting, Cuff Size: Normal)   Pulse 62   Temp 98 F (36.7 C) (Temporal)   Ht 5' 3" (1.6 m)   LMP  (LMP Unknown)   SpO2 97%   BMI 22.13 kg/m   Wt Readings from Last 3 Encounters:  08/24/19 124 lb 14.4 oz (56.7 kg)  02/04/19 124 lb (56.2 kg)  07/21/18 134 lb 6.4 oz (61 kg)    Physical Exam Vitals and nursing note reviewed.  Constitutional:      General: She is not in acute distress.    Appearance: Normal appearance. She is not ill-appearing, toxic-appearing or diaphoretic.  HENT:     Head: Normocephalic and atraumatic.     Right Ear: External ear normal.     Left Ear: External ear normal.     Nose: Nose normal.     Mouth/Throat:     Mouth: Mucous membranes are moist.     Pharynx: Oropharynx is clear.  Eyes:     General: No scleral icterus.       Right eye: No discharge.        Left eye: No discharge.     Extraocular Movements: Extraocular movements intact.     Conjunctiva/sclera: Conjunctivae normal.     Pupils: Pupils are equal, round, and reactive to light.  Cardiovascular:     Rate and Rhythm: Normal rate and regular rhythm.     Pulses: Normal pulses.     Heart sounds: Normal heart sounds. No murmur. No friction rub. No gallop.   Pulmonary:     Effort: Pulmonary effort is normal. No respiratory distress.     Breath sounds: Normal breath sounds. No stridor. No wheezing, rhonchi or rales.  Chest:     Chest wall: No tenderness.  Musculoskeletal:        General: Normal range of motion.     Cervical back: Normal range of motion and neck supple.  Skin:    General:  Skin is warm and dry.     Capillary Refill: Capillary refill takes less than 2 seconds.     Coloration: Skin is not jaundiced or pale.     Findings: No bruising, erythema, lesion or rash.  Neurological:     General: No focal deficit present.     Mental Status: She is alert and oriented to person, place, and time. Mental status is at baseline.  Psychiatric:  Mood and Affect: Mood normal.        Behavior: Behavior normal.        Thought Content: Thought content normal.        Judgment: Judgment normal.     Results for orders placed or performed in visit on 07/21/19  UA/M w/rflx Culture, Routine   Specimen: Urine   URINE  Result Value Ref Range   Specific Gravity, UA 1.020 1.005 - 1.030   pH, UA 5.5 5.0 - 7.5   Color, UA Yellow Yellow   Appearance Ur Clear Clear   Leukocytes,UA Negative Negative   Protein,UA Negative Negative/Trace   Glucose, UA Negative Negative   Ketones, UA Negative Negative   RBC, UA Negative Negative   Bilirubin, UA 3+ (A) Negative   Urobilinogen, Ur 0.2 0.2 - 1.0 mg/dL   Nitrite, UA Negative Negative      Assessment & Plan:   Problem List Items Addressed This Visit      Cardiovascular and Mediastinum   Coronary artery disease involving native coronary artery of native heart without angina pectoris - Primary    Will keep BP and cholesterol under good control. Labs drawn today. Continue to monitor. Call with any concerns.       Relevant Orders   CBC with Differential/Platelet   Comprehensive metabolic panel   TSH   UA/M w/rflx Culture, Routine     Respiratory   Pulmonary fibrosis ([place])    Follows with pulmonology. Stable. Continue to monitor. Call with any concerns.       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 Stable. Continue to monitor. Call with any concerns.        Relevant Orders   CBC with Differential/Platelet   Comprehensive metabolic panel   TSH   UA/M w/rflx Culture, Routine   Chronic respiratory failure with hypoxia ([place])    Follows with pulmonology. Stable. Continue to monitor. Call with any concerns.       Relevant Orders   CBC with Differential/Platelet   Comprehensive metabolic panel   TSH   UA/M w/rflx Culture, Routine     Musculoskeletal and Integument   Osteoarthritis    Worsening. May need surgery. Continue to follow with ortho. Call with any concerns.       Relevant Medications   baclofen (LIORESAL) 10 MG tablet   Age-related osteoporosis with current pathological fracture    Due for recheck on her DEXA next year. On prolia. Continue to monitor. Call with any concerns.       Relevant Orders   CBC with Differential/Platelet   Comprehensive metabolic panel   TSH   UA/M w/rflx Culture, Routine     Other   Anxiety and depression    Under good control on current regimen. Continue current regimen. Continue to monitor. Call with any concerns. Refills given.        Relevant Orders   CBC with Differential/Platelet   Comprehensive metabolic panel   TSH   UA/M w/rflx Culture, Routine   Hyperlipidemia    Under good control on current regimen. Continue current regimen. Continue to monitor. Call with any concerns. Refills given. Labs drawn today.       Relevant Orders   CBC with Differential/Platelet   Comprehensive metabolic panel   Lipid Panel w/o Chol/HDL Ratio   TSH  UA/M w/rflx Culture, Routine   Elevated liver enzymes    Rechecking labs today. Await results. Treat as needed.       Relevant Orders   CBC with Differential/Platelet   Comprehensive metabolic panel   TSH   UA/M w/rflx Culture, Routine   Iron deficiency anemia due to chronic blood loss    Has appt for EGD/Colonoscopy on 6/2- but getting worse and increasingly symptomatic. She is concerned about waiting 2 months. We will see if we can get her  set up sooner.       Relevant Orders   Ambulatory referral to Gastroenterology    Other Visit Diagnoses    Heme positive stool       Has appt for EGD/Colonoscopy on 6/2- but getting worse. She is concerned about waiting 2 months. We will see if we can get her set up sooner.    Relevant Orders   Ambulatory referral to Gastroenterology       Follow up plan: Return in about 6 months (around 02/23/2020).

## 2019-08-24 NOTE — Assessment & Plan Note (Signed)
Follows with pulmonology. Stable. Continue to monitor. Call with any concerns.  

## 2019-08-24 NOTE — Assessment & Plan Note (Signed)
Due for recheck on her DEXA next year. On prolia. Continue to monitor. Call with any concerns.

## 2019-08-24 NOTE — Assessment & Plan Note (Deleted)
Has appt for EGD/Colonoscopy on 6/2- but getting worse. She is concerned about waiting 2 months. We will see if we can get her set up sooner.

## 2019-08-24 NOTE — Assessment & Plan Note (Signed)
Will keep BP and cholesterol under good control. Labs drawn today. Continue to monitor. Call with any concerns.

## 2019-08-24 NOTE — Assessment & Plan Note (Signed)
Follows with pulmonology. Stable. Continue to monitor. Call with any concerns.

## 2019-08-24 NOTE — Assessment & Plan Note (Signed)
Rechecking labs today. Await results. Treat as needed.  °

## 2019-08-24 NOTE — Assessment & Plan Note (Signed)
Has appt for EGD/Colonoscopy on 6/2- but getting worse and increasingly symptomatic. She is concerned about waiting 2 months. We will see if we can get her set up sooner.

## 2019-08-25 ENCOUNTER — Encounter: Payer: Self-pay | Admitting: Family Medicine

## 2019-08-25 DIAGNOSIS — M545 Low back pain: Secondary | ICD-10-CM | POA: Diagnosis not present

## 2019-08-25 DIAGNOSIS — M1611 Unilateral primary osteoarthritis, right hip: Secondary | ICD-10-CM | POA: Diagnosis not present

## 2019-08-25 LAB — LIPID PANEL W/O CHOL/HDL RATIO
Cholesterol, Total: 216 mg/dL — ABNORMAL HIGH (ref 100–199)
HDL: 67 mg/dL (ref >39)
LDL Chol Calc (NIH): 119 mg/dL — ABNORMAL HIGH (ref 0–99)
Triglycerides: 174 mg/dL — ABNORMAL HIGH (ref 0–149)
VLDL Cholesterol Cal: 30 mg/dL (ref 5–40)

## 2019-08-25 LAB — COMPREHENSIVE METABOLIC PANEL
ALT: 14 IU/L (ref 0–32)
AST: 23 IU/L (ref 0–40)
Albumin/Globulin Ratio: 2 (ref 1.2–2.2)
Albumin: 4.4 g/dL (ref 3.7–4.7)
Alkaline Phosphatase: 61 IU/L (ref 39–117)
BUN/Creatinine Ratio: 14 (ref 12–28)
BUN: 11 mg/dL (ref 8–27)
Bilirubin Total: <0.2 mg/dL (ref 0.0–1.2)
CO2: 21 mmol/L (ref 20–29)
Calcium: 9.4 mg/dL (ref 8.7–10.3)
Chloride: 104 mmol/L (ref 96–106)
Creatinine, Ser: 0.77 mg/dL (ref 0.57–1.00)
GFR calc Af Amer: 89 mL/min/{1.73_m2} (ref >59)
GFR calc non Af Amer: 77 mL/min/{1.73_m2} (ref >59)
Globulin, Total: 2.2 g/dL (ref 1.5–4.5)
Glucose: 67 mg/dL (ref 65–99)
Potassium: 3.4 mmol/L — ABNORMAL LOW (ref 3.5–5.2)
Sodium: 143 mmol/L (ref 134–144)
Total Protein: 6.6 g/dL (ref 6.0–8.5)

## 2019-08-25 LAB — CBC WITH DIFFERENTIAL/PLATELET
Basophils Absolute: 0 10*3/uL (ref 0.0–0.2)
Basos: 0 %
EOS (ABSOLUTE): 0.2 10*3/uL (ref 0.0–0.4)
Eos: 2 %
Hematocrit: 35 % (ref 34.0–46.6)
Hemoglobin: 11.5 g/dL (ref 11.1–15.9)
Immature Grans (Abs): 0 10*3/uL (ref 0.0–0.1)
Immature Granulocytes: 0 %
Lymphocytes Absolute: 1.1 10*3/uL (ref 0.7–3.1)
Lymphs: 13 %
MCH: 30.3 pg (ref 26.6–33.0)
MCHC: 32.9 g/dL (ref 31.5–35.7)
MCV: 92 fL (ref 79–97)
Monocytes Absolute: 0.8 10*3/uL (ref 0.1–0.9)
Monocytes: 9 %
Neutrophils Absolute: 6.5 10*3/uL (ref 1.4–7.0)
Neutrophils: 76 %
Platelets: 289 10*3/uL (ref 150–450)
RBC: 3.8 x10E6/uL (ref 3.77–5.28)
RDW: 13.4 % (ref 11.7–15.4)
WBC: 8.6 10*3/uL (ref 3.4–10.8)

## 2019-08-25 LAB — TSH: TSH: 1.29 u[IU]/mL (ref 0.450–4.500)

## 2019-08-26 ENCOUNTER — Encounter: Payer: Self-pay | Admitting: Family Medicine

## 2019-08-26 LAB — URINE CULTURE, REFLEX

## 2019-08-26 LAB — UA/M W/RFLX CULTURE, ROUTINE
Bilirubin, UA: NEGATIVE
Glucose, UA: NEGATIVE
Ketones, UA: NEGATIVE
Nitrite, UA: NEGATIVE
Specific Gravity, UA: >1.03 — ABNORMAL HIGH (ref 1.005–1.030)
Urobilinogen, Ur: 0.2 mg/dL (ref 0.2–1.0)
pH, UA: 6 (ref 5.0–7.5)

## 2019-08-26 LAB — MICROSCOPIC EXAMINATION: Bacteria, UA: NONE SEEN

## 2019-08-28 ENCOUNTER — Ambulatory Visit: Payer: Self-pay | Admitting: Family Medicine

## 2019-09-01 ENCOUNTER — Ambulatory Visit (INDEPENDENT_AMBULATORY_CARE_PROVIDER_SITE_OTHER): Payer: Medicare Other | Admitting: Pharmacist

## 2019-09-01 DIAGNOSIS — F419 Anxiety disorder, unspecified: Secondary | ICD-10-CM

## 2019-09-01 DIAGNOSIS — F329 Major depressive disorder, single episode, unspecified: Secondary | ICD-10-CM

## 2019-09-01 DIAGNOSIS — M1990 Unspecified osteoarthritis, unspecified site: Secondary | ICD-10-CM

## 2019-09-01 DIAGNOSIS — J8410 Pulmonary fibrosis, unspecified: Secondary | ICD-10-CM

## 2019-09-01 NOTE — Patient Instructions (Signed)
Visit Information  Goals Addressed            This Visit's Progress     Patient Stated   . PharmD "I am on a lot of medications" (pt-stated)       CARE PLAN ENTRY (see longtitudinal plan of care for additional care plan information)  Current Barriers:  . Polypharmacy; complex patient with multiple comorbidities including connective tissue disorder, pulmonary fibrosis, Raynaud's disease, CAD . Most recent eGFR: ~ 77 mL/min o Pulmonary Fibrosis: follows w/ [facility]; NAC 1200 mg QAM, HCQ 400 mg daily, mycophenoate 100 mg BID, prednisone 10 mg daily. O2 daily. PCP prevention: sulfamethoxazole/TMP 800/160 mg TIW o Osteoporosis: s/p fall, fracture in 2019. Prolia Q6M. Calcium 1200 mg daily (600 mg BID) and Vitamin D 2000 units daily o Chronic pain/arthritis: acetaminophen 1300 mg BID (previously taking TID prior to receiving joint injection); holding etodolac while anemia is being worked up; tramadol 50 mg PRN, baclofen 10 mg PRN o HTN/Raynaud's: nifedipine 30 mg daily o GERD: omeprazole 20 mg daily; reports that [facility] is trying to move her colonoscopy to sooner, but she has an appt w/ [facility] on Thursday o Depression/anxiety: sertraline 100 mg daily o Insomnia: zolpidem 5 mg PRN, ~2-3 times weekly  o Additional supplements: MVI, biotin 5000 mcg daily, omega 3 fatty acids 1000 mg daily, cinnamon daily   Pharmacist Clinical Goal(s):  Marland Kitchen Over the next 90 days, patient will work with PharmD and provider towards optimized medication management  Interventions: . Comprehensive medication review performed; medication list updated in electronic medical record . Inter-disciplinary care team collaboration (see longitudinal plan of care) . Reviewed principals of calcium supplementation. Patient is appropriately taking calcium 600 mg tabs BID, and not at the same time as dairy products . Patient asked if she should keep taking cinnamon. Reviewed that data is questionable, but  strongest in blood sugar control, which is not a concern for her. She is planning on discontinuing.  . Reviewed importance of holding etodolac and other NSAIDs while ?GIB is being evaluated. She verbalized understanding  Patient Self Care Activities:  . Patient will take medications as prescribed  Initial goal documentation        Patient verbalizes understanding of instructions provided today.   Plan: - Scheduled f/u call 11/24/19  Catie Darnelle Maffucci, PharmD, [place] [phone]

## 2019-09-01 NOTE — Chronic Care Management (AMB) (Signed)
**Note Melanie-Identified via Obfuscation** Chronic Care Management   Note  09/01/2019 Name: Melanie Rollins MRN: [id number] DOB: 1945/06/15   Subjective:  Melanie Rollins is a 74 y.o. year old female who is a primary care patient of Melanie Roys, Melanie Rollins. The CCM team was consulted for assistance with chronic disease management and care coordination needs.    Contacted patient for medication management review.   Review of patient status, including review of consultants reports, laboratory and other test data, was performed as part of comprehensive evaluation and provision of chronic care management services.   SDOH (Social Determinants of Health) assessments and interventions performed: no  Objective:  Lab Results  Component Value Date   CREATININE 0.77 08/24/2019   CREATININE 0.82 02/23/2019   CREATININE 0.80 08/04/2018      Component Value Date/Time   CHOL 216 (H) 08/24/2019 0934   TRIG 174 (H) 08/24/2019 0934   HDL 67 08/24/2019 0934   LDLCALC 119 (H) 08/24/2019 0934    Clinical ASCVD: No  The 10-year ASCVD risk score Mikey Bussing DC Jr., et al., 2013) is: 8.2%   Values used to calculate the score:     Age: 45 years     Sex: Female     Is Non-Hispanic African American: No     Diabetic: No     Tobacco smoker: No     Systolic Blood Pressure: 884 mmHg     Is BP treated: No     HDL Cholesterol: 67 mg/dL     Total Cholesterol: 216 mg/dL    BP Readings from Last 3 Encounters:  08/24/19 (!) 106/54  08/24/19 (!) 106/54  07/21/19 111/70    Allergies  Allergen Reactions  . Imuran [Azathioprine] Nausea Only and Other (See Comments)    Cold chills and bruise like welps    Medications Reviewed Today    Reviewed by Melanie Rollins, [facility] (Pharmacist) on 09/01/19 at [place] List Status: <None>  Medication Order Taking? Sig Documenting Provider Last Dose Status Informant  ACETAMINOPHEN PO [id number] Yes Take 1,300 mg by mouth in the morning and at bedtime.  [provider] Taking Active            Med Note  Darnelle Rollins, Melanie Rollins   Tue Sep 01, 2019  2:00 PM)    Acetylcysteine (NAC) 600 MG CAPS [id number] Yes Take 1 capsule (600 mg total) by mouth 2 (two) times daily.  Patient taking differently: Take 1,200 mg by mouth daily.    Melanie Fee, Melanie Rollins Taking Active   acyclovir ointment (ZOVIRAX) 5 % [id number] Yes Apply 1 application topically every 3 (three) hours. Melanie Roys, Melanie Rollins Taking Active            Med Note ([place] Sep 01, 2019  2:02 PM) Using PRN cold sores   baclofen (LIORESAL) 10 MG tablet [id number] Yes Take 1 tablet (10 mg total) by mouth at bedtime as needed for muscle spasms. Melanie Roys, Melanie Rollins Taking Active            Med Note ([place] Sep 01, 2019  2:03 PM) Using ~couple times weekly  BIOTIN PO [id number] Yes Take 5,000 mcg by mouth daily.  [provider] Taking Active Self  Calcium Citrate-Vitamin D (CALCIUM + D PO) [id number] Yes Take 600 mg by mouth in the morning and at bedtime.  [provider] Taking Active Self  Cholecalciferol (VITAMIN D) 2000 units CAPS [id number] Yes Take  2,000 Units by mouth daily. [provider] Taking Active Self  CINNAMON PO [id number] Yes Take 1,000 mg by mouth daily. [provider] Taking Active Self  denosumab (PROLIA) 60 MG/ML SOSY injection [id number] Yes Inject into the skin. [provider] Taking Active   etodolac (LODINE) 200 MG capsule [id number] No 2 (two) times daily.  [provider] Not Taking Active   hydroxychloroquine (PLAQUENIL) 200 MG tablet [id number] Yes Take 2 tablets (400 mg total) by mouth daily. Melanie Fee, Melanie Rollins Taking Active         Discontinued 09/01/19 1411 (Completed Course)         Discontinued 09/01/19 1411 (Completed Course)   Multiple Vitamin (MULTIVITAMIN) tablet [id number] Yes Take 1 tablet by mouth daily. [provider] Taking Active Self  mupirocin ointment (BACTROBAN) 2 % [id number] No Place 1 application into the nose  2 (two) times daily.  Patient not taking: Reported on 09/01/2019   Melanie Roys, Melanie Rollins Not Taking Active   mycophenolate (CELLCEPT) 500 MG tablet [id number] Yes Take 2 tablets (1,000 mg total) by mouth 2 (two) times daily. Melanie Fee, Melanie Rollins Taking Active   NIFEdipine (PROCARDIA-XL/NIFEDICAL-XL) 30 MG 24 hr tablet [id number] Yes Take 30 mg by mouth daily. [provider] Taking Active   Omega-3 Fatty Acids (FISH OIL) 1000 MG CAPS [id number] Yes Take 1,000 mg by mouth daily. [provider] Taking Active Self  omeprazole (PRILOSEC) 20 MG capsule [id number] Yes Take 1 capsule (20 mg total) by mouth daily. Melanie Roys, Melanie Rollins Taking Active   OXYGEN [id number] Yes 2 L/min by Continuous infusion (non-IV) route daily. [provider] Taking Active   permethrin (ELIMITE) 5 % cream [id number] Yes permethrin 5 % topical cream [provider] Taking Active            Med Note ([place] Sep 01, 2019  2:17 PM) For rosacea  predniSONE (DELTASONE) 10 MG tablet [id number] Yes Take 1 tablet (10 mg total) by mouth daily. Melanie Fee, Melanie Rollins Taking Active   sertraline (ZOLOFT) 100 MG tablet [id number] Yes Take 1 tablet (100 mg total) by mouth daily. [place] P, Melanie Rollins Taking Active   sulfamethoxazole-trimethoprim (BACTRIM DS) 800-160 MG tablet [id number] Yes Take 1 tablet (160 mg of trimethoprim total) by mouth 3 (three) timesa week [provider] Taking Active   traMADol (ULTRAM) 50 MG tablet [id number] Yes Take 50 mg by mouth every 6 (six) hours as needed. [provider] Taking Active            Med Note ([place] Sep 01, 2019  2:20 PM) ~couple times weekly  zolpidem (AMBIEN) 10 MG tablet [id number] Yes Take 0.5 tablets (5 mg total) by mouth at bedtime. Melanie Roys, Melanie Rollins Taking Active            Assessment:   Goals Addressed            This Visit's Progress     Patient Stated   . PharmD "I am on a lot of  medications" (pt-stated)       CARE PLAN ENTRY (see longtitudinal plan of care for additional care plan information)  Current Barriers:  . Polypharmacy; complex patient with multiple comorbidities including connective tissue disorder, pulmonary fibrosis, Raynaud's disease, CAD . Most recent eGFR: ~ 77 mL/min o Pulmonary Fibrosis: follows w/ [facility]; NAC 1200 mg QAM, HCQ 400 mg daily, mycophenoate 100 mg BID,  prednisone 10 mg daily. O2 daily. PCP prevention: sulfamethoxazole/TMP 800/160 mg TIW o Osteoporosis: s/p fall, fracture in 2019. Prolia Q6M. Calcium 1200 mg daily (600 mg BID) and Vitamin D 2000 units daily o Chronic pain/arthritis: acetaminophen 1300 mg BID (previously taking TID prior to receiving joint injection); holding etodolac while anemia is being worked up; tramadol 50 mg PRN, baclofen 10 mg PRN o HTN/Raynaud's: nifedipine 30 mg daily o GERD: omeprazole 20 mg daily; reports that [facility] is trying to move her colonoscopy to sooner, but she has an appt w/ [facility] on Thursday o Depression/anxiety: sertraline 100 mg daily o Insomnia: zolpidem 5 mg PRN, ~2-3 times weekly  o Additional supplements: MVI, biotin 5000 mcg daily, omega 3 fatty acids 1000 mg daily, cinnamon daily   Pharmacist Clinical Goal(s):  Marland Kitchen Over the next 90 days, patient will work with PharmD and provider towards optimized medication management  Interventions: . Comprehensive medication review performed; medication list updated in electronic medical record . Inter-disciplinary care team collaboration (see longitudinal plan of care) . Reviewed principals of calcium supplementation. Patient is appropriately taking calcium 600 mg tabs BID, and not at the same time as dairy products . Patient asked if she should keep taking cinnamon. Reviewed that data is questionable, but strongest in blood sugar control, which is not a concern for her. She is planning on discontinuing.  . Reviewed importance of  holding etodolac and other NSAIDs while ?GIB is being evaluated. She verbalized understanding  Patient Self Care Activities:  . Patient will take medications as prescribed  Initial goal documentation        Plan: - Scheduled f/u call 11/24/19  Melanie Rollins, PharmD, [place] [phone]

## 2019-09-02 ENCOUNTER — Encounter: Payer: Self-pay | Admitting: Gastroenterology

## 2019-09-03 ENCOUNTER — Ambulatory Visit (INDEPENDENT_AMBULATORY_CARE_PROVIDER_SITE_OTHER): Payer: Medicare Other | Admitting: Gastroenterology

## 2019-09-03 ENCOUNTER — Other Ambulatory Visit: Payer: Self-pay

## 2019-09-03 ENCOUNTER — Encounter: Payer: Self-pay | Admitting: Gastroenterology

## 2019-09-03 DIAGNOSIS — D649 Anemia, unspecified: Secondary | ICD-10-CM | POA: Diagnosis not present

## 2019-09-03 DIAGNOSIS — R109 Unspecified abdominal pain: Secondary | ICD-10-CM

## 2019-09-03 DIAGNOSIS — R110 Nausea: Secondary | ICD-10-CM

## 2019-09-03 DIAGNOSIS — R195 Other fecal abnormalities: Secondary | ICD-10-CM | POA: Diagnosis not present

## 2019-09-03 MED ORDER — NA SULFATE-K SULFATE-MG SULF 17.5-3.13-1.6 GM/177ML PO SOLN: 354.0000 mL | Freq: Once | ORAL | 0 refills | Status: AC

## 2019-09-03 NOTE — Progress Notes (Signed)
Melanie Rollins [address]  [place], [place] [address]  Main: [phone]  Fax: [phone]   Gastroenterology Consultation  Referring Provider:     Valerie Roys, DO Primary Care Physician:  Valerie Roys, DO Reason for Consultation:     Anemia, positive FOBT        HPI:   Virtual Visit via Telephone Note  I connected with patient on 09/03/19 at  2:15 PM EDT by telephone and verified that I am speaking with the correct person using two identifiers.   I discussed the limitations, risks, security and privacy concerns of performing an evaluation and management service by telephone and the availability of in person appointments. I also discussed with the patient that there may be a patient responsible charge related to this service. The patient expressed understanding and agreed to proceed.  Location of the patient: Home Location of provider: Home Participating persons: Patient and provider only   History of Present Illness: Chief Complaint  Patient presents with  . New Patient (Initial Visit)  . Anemia    Has been fatigue had a postive stool test      Melanie Rollins is a 74 y.o. y/o female referred for consultation & management  by Dr. Wynetta Emery, Megan P, DO.  Patient recently was found to have a drop in hemoglobin to around 10, with the most recent lab from earlier this month being normal above 11.  Anemia was found to be normocytic with normal serum iron and iron saturation, see care everywhere.  She was referred to gastroenterology and saw [facility] clinic for the same and they scheduled her for an EGD and colonoscopy but not for 2 months and patient wanted a sooner work-up.  Patient denies any blood in stool.  No melena.  Describes loss of appetite and subjective weight loss.  No dysphagia.  Reports nausea and some abdominal cramping and discomfort with this, and also intermittent vomiting about once a week with it as well.  Colonoscopy in 2011  reports not available, orders reportedly normal as per the patient.  No prior EGD.  No family history of colon cancer.  FOBT was positive recently.  Past Medical History:  Diagnosis Date  . Arthritis    RA  . Closed left hip fracture ([place]) 03/10/2018  . Hip fracture requiring operative repair [facility])    left hip   . Hyperlipidemia   . Nephrolithiasis 03/14/2015  . Pulmonary fibrosis ([place])   . Reynolds syndrome [facility])     Past Surgical History:  Procedure Laterality Date  . INTRAMEDULLARY (IM) NAIL INTERTROCHANTERIC Left 03/10/2018   Procedure: INTRAMEDULLARY (IM) NAIL INTERTROCHANTRIC-LEFT TFNA;  Surgeon: Lovell Sheehan, MD;  Location: [facility] ORS;  Service: Orthopedics;  Laterality: Left;  . kidney stones    . LUNG BIOPSY    . SQUAMOUS CELL CARCINOMA EXCISION Left    shin of leg    Prior to Admission medications   Medication Sig Start Date End Date Taking? Authorizing Provider  ACETAMINOPHEN PO Take 1,300 mg by mouth in the morning and at bedtime.  03/18/18  Yes [provider]  Acetylcysteine (NAC) 600 MG CAPS Take 1 capsule (600 mg total) by mouth 2 (two) times daily. Patient taking differently: Take 1,200 mg by mouth daily.  03/25/18  Yes Gerlene Fee, NP  acyclovir ointment (ZOVIRAX) 5 % Apply 1 application topically every 3 (three) hours. 08/18/18  Yes Johnson, Megan P, DO  baclofen (LIORESAL) 10 MG tablet  Take 1 tablet (10 mg total) by mouth at bedtime as needed for muscle spasms. 08/24/19  Yes Johnson, Megan P, DO  BIOTIN PO Take 5,000 mcg by mouth daily.  03/18/18  Yes [provider]  Calcium Citrate-Vitamin D (CALCIUM + D PO) Take 600 mg by mouth in the morning and at bedtime.    Yes [provider]  Cholecalciferol (VITAMIN D) 2000 units CAPS Take 2,000 Units by mouth daily.   Yes [provider]  CINNAMON PO Take 1,000 mg by mouth daily.   Yes [provider]  denosumab (PROLIA) 60 MG/ML SOSY injection Inject into the skin.    Yes [provider]  etodolac (LODINE) 200 MG capsule 2 (two) times daily.  01/23/19  Yes [provider]  hydroxychloroquine (PLAQUENIL) 200 MG tablet Take 2 tablets (400 mg total) by mouth daily. 03/25/18  Yes Gerlene Fee, NP  Multiple Vitamin (MULTIVITAMIN) tablet Take 1 tablet by mouth daily.   Yes [provider]  mupirocin ointment (BACTROBAN) 2 % Place 1 application into the nose 2 (two) times daily. 08/24/19  Yes Johnson, Megan P, DO  mycophenolate (CELLCEPT) 500 MG tablet Take 2 tablets (1,000 mg total) by mouth 2 (two) times daily. 03/25/18  Yes Gerlene Fee, NP  NIFEdipine (PROCARDIA-XL/NIFEDICAL-XL) 30 MG 24 hr tablet Take 30 mg by mouth daily. 07/03/19  Yes [provider]  Omega-3 Fatty Acids (FISH OIL) 1000 MG CAPS Take 1,000 mg by mouth daily.   Yes [provider]  omeprazole (PRILOSEC) 20 MG capsule Take 1 capsule (20 mg total) by mouth daily. 08/24/19  Yes Johnson, Megan P, DO  OXYGEN 2 L/min by Continuous infusion (non-IV) route daily.   Yes [provider]  permethrin (ELIMITE) 5 % cream permethrin 5 % topical cream   Yes [provider]  predniSONE (DELTASONE) 10 MG tablet Take 1 tablet (10 mg total) by mouth daily. 03/25/18  Yes Gerlene Fee, NP  sertraline (ZOLOFT) 100 MG tablet Take 1 tablet (100 mg total) by mouth daily. 07/21/19  Yes Johnson, Megan P, DO  sulfamethoxazole-trimethoprim (BACTRIM DS) 800-160 MG tablet Take 1 tablet (160 mg of trimethoprim total) by mouth 3 (three) timesa week 11/17/18 11/17/19 Yes [provider]  traMADol (ULTRAM) 50 MG tablet Take 50 mg by mouth every 6 (six) hours as needed. 06/30/19  Yes [provider]  zolpidem (AMBIEN) 10 MG tablet Take 0.5 tablets (5 mg total) by mouth at bedtime. 02/04/19  Yes Johnson, Megan P, DO    Family History  Problem Relation Age of Onset  . Hypertension Mother   . Heart disease Mother        pacemaker and CHF  . Glaucoma Mother    . Hypertension Brother   . Arthritis Sister   . Heart disease Maternal Grandmother   . Heart disease Maternal Grandfather   . Hypertension Paternal Grandmother   . Stroke Paternal Grandmother   . Cancer Paternal Grandfather      Social History   Tobacco Use  . Smoking status: Never Smoker  . Smokeless tobacco: Never Used  Substance Use Topics  . Alcohol use: Yes    Alcohol/week: 0.0 standard drinks    Comment: pt states she drinks wine on occasion  . Drug use: No    Allergies as of 09/03/2019 - Review Complete 09/03/2019  Allergen Reaction Noted  . Imuran [azathioprine] Nausea Only and Other (See Comments) 03/14/2015    Review of Systems:  All systems reviewed and negative except where noted in HPI.   Observations/Objective:  Labs: CBC    Component Value Date/Time   WBC 8.6 08/24/2019 0934   WBC 9.1 03/20/2018 0803   RBC 3.80 08/24/2019 0934   RBC 2.98 (L) 03/20/2018 0803   HGB 11.5 08/24/2019 0934   HCT 35.0 08/24/2019 0934   PLT 289 08/24/2019 0934   MCV 92 08/24/2019 0934   MCH 30.3 08/24/2019 0934   MCH 29.5 03/20/2018 0803   MCHC 32.9 08/24/2019 0934   MCHC 31.7 03/20/2018 0803   RDW 13.4 08/24/2019 0934   LYMPHSABS 1.1 08/24/2019 0934   MONOABS 0.9 03/20/2018 0803   EOSABS 0.2 08/24/2019 0934   BASOSABS 0.0 08/24/2019 0934   CMP     Component Value Date/Time   NA 143 08/24/2019 0934   K 3.4 (L) 08/24/2019 0934   CL 104 08/24/2019 0934   CO2 21 08/24/2019 0934   GLUCOSE 67 08/24/2019 0934   GLUCOSE 115 (H) 03/12/2018 0617   BUN 11 08/24/2019 0934   CREATININE 0.77 08/24/2019 0934   CALCIUM 9.4 08/24/2019 0934   PROT 6.6 08/24/2019 0934   ALBUMIN 4.4 08/24/2019 0934   AST 23 08/24/2019 0934   ALT 14 08/24/2019 0934   ALKPHOS 61 08/24/2019 0934   BILITOT <0.2 08/24/2019 0934   GFRNONAA 77 08/24/2019 0934   GFRAA 89 08/24/2019 0934    Imaging Studies: No results found.  Assessment and Plan:   Melanie Rollins is a 74 y.o. y/o  female has been referred for positive FOBT  Assessment and Plan: Last colonoscopy was 10 years ago and therefore patient is due for another colonoscopy also has a positive FOBT  Normocytic anemia is not an indication for EGD, but rather her abdominal discomfort, and weight loss is.  EGD would help take biopsies for H. pylori, and rule out any underlying lesions  I have discussed alternative options, risks & benefits,  which include, but are not limited to, bleeding, infection, perforation,respiratory complication & drug reaction.  The patient agrees with this plan & written consent will be obtained.    Hematology referral for normocytic anemia  Follow Up Instructions:   I discussed the assessment and treatment plan with the patient. The patient was provided an opportunity to ask questions and all were answered. The patient agreed with the plan and demonstrated an understanding of the instructions.   The patient was advised to call back or seek an in-person evaluation if the symptoms worsen or if the condition fails to improve as anticipated.  I provided 15 minutes of non-face-to-face time during this encounter.   Virgel Manifold, MD  Speech recognition software was used to dictate the above note.

## 2019-09-10 ENCOUNTER — Inpatient Hospital Stay: Payer: Medicare Other | Attending: Oncology | Admitting: Oncology

## 2019-09-10 ENCOUNTER — Encounter: Payer: Self-pay | Admitting: Oncology

## 2019-09-10 ENCOUNTER — Inpatient Hospital Stay: Payer: Medicare Other

## 2019-09-10 ENCOUNTER — Other Ambulatory Visit: Payer: Self-pay

## 2019-09-10 VITALS — BP 108/76 | HR 96 | Temp 98.4°F | Resp 18 | Wt 127.0 lb

## 2019-09-10 DIAGNOSIS — D649 Anemia, unspecified: Secondary | ICD-10-CM | POA: Insufficient documentation

## 2019-09-10 DIAGNOSIS — R0602 Shortness of breath: Secondary | ICD-10-CM | POA: Diagnosis not present

## 2019-09-10 DIAGNOSIS — Z9981 Dependence on supplemental oxygen: Secondary | ICD-10-CM | POA: Insufficient documentation

## 2019-09-10 DIAGNOSIS — Z85828 Personal history of other malignant neoplasm of skin: Secondary | ICD-10-CM | POA: Diagnosis not present

## 2019-09-10 DIAGNOSIS — Z809 Family history of malignant neoplasm, unspecified: Secondary | ICD-10-CM | POA: Insufficient documentation

## 2019-09-10 DIAGNOSIS — J8410 Pulmonary fibrosis, unspecified: Secondary | ICD-10-CM | POA: Diagnosis not present

## 2019-09-10 LAB — COMPREHENSIVE METABOLIC PANEL
ALT: 21 U/L (ref 0–44)
AST: 31 U/L (ref 15–41)
Albumin: 4.5 g/dL (ref 3.5–5.0)
Alkaline Phosphatase: 58 U/L (ref 38–126)
Anion gap: 12 (ref 5–15)
BUN: 14 mg/dL (ref 8–23)
CO2: 26 mmol/L (ref 22–32)
Calcium: 9.8 mg/dL (ref 8.9–10.3)
Chloride: 99 mmol/L (ref 98–111)
Creatinine, Ser: 0.86 mg/dL (ref 0.44–1.00)
GFR calc Af Amer: >60 mL/min (ref >60)
GFR calc non Af Amer: >60 mL/min (ref >60)
Glucose, Bld: 121 mg/dL — ABNORMAL HIGH (ref 70–99)
Potassium: 3.9 mmol/L (ref 3.5–5.1)
Sodium: 137 mmol/L (ref 135–145)
Total Bilirubin: 0.5 mg/dL (ref 0.3–1.2)
Total Protein: 8 g/dL (ref 6.5–8.1)

## 2019-09-10 LAB — FOLATE: Folate: 76 ng/mL (ref >5.9)

## 2019-09-10 LAB — CBC WITH DIFFERENTIAL/PLATELET
Abs Immature Granulocytes: 0.12 10*3/uL — ABNORMAL HIGH (ref 0.00–0.07)
Basophils Absolute: 0 10*3/uL (ref 0.0–0.1)
Basophils Relative: 0 %
Eosinophils Absolute: 0 10*3/uL (ref 0.0–0.5)
Eosinophils Relative: 0 %
HCT: 39.7 % (ref 36.0–46.0)
Hemoglobin: 12.7 g/dL (ref 12.0–15.0)
Immature Granulocytes: 1 %
Lymphocytes Relative: 5 %
Lymphs Abs: 0.6 10*3/uL — ABNORMAL LOW (ref 0.7–4.0)
MCH: 30.2 pg (ref 26.0–34.0)
MCHC: 32 g/dL (ref 30.0–36.0)
MCV: 94.5 fL (ref 80.0–100.0)
Monocytes Absolute: 0.5 10*3/uL (ref 0.1–1.0)
Monocytes Relative: 4 %
Neutro Abs: 9.7 10*3/uL — ABNORMAL HIGH (ref 1.7–7.7)
Neutrophils Relative %: 90 %
Platelets: 317 10*3/uL (ref 150–400)
RBC: 4.2 MIL/uL (ref 3.87–5.11)
RDW: 12.9 % (ref 11.5–15.5)
WBC: 10.9 10*3/uL — ABNORMAL HIGH (ref 4.0–10.5)
nRBC: 0 % (ref 0.0–0.2)

## 2019-09-10 LAB — IRON AND TIBC
Iron: 72 ug/dL (ref 28–170)
Saturation Ratios: 21 % (ref 10.4–31.8)
TIBC: 337 ug/dL (ref 250–450)
UIBC: 265 ug/dL

## 2019-09-10 LAB — RETICULOCYTES
Immature Retic Fract: 5.5 % (ref 2.3–15.9)
RBC.: 4.21 MIL/uL (ref 3.87–5.11)
Retic Count, Absolute: 60.6 10*3/uL (ref 19.0–186.0)
Retic Ct Pct: 1.4 % (ref 0.4–3.1)

## 2019-09-10 LAB — VITAMIN B12: Vitamin B-12: 591 pg/mL (ref 180–914)

## 2019-09-10 LAB — FERRITIN: Ferritin: 118 ng/mL (ref 11–307)

## 2019-09-10 NOTE — Progress Notes (Signed)
Patient here for initial oncology appointment, states she has a lot of weakness, dizziness and pain in her mid back and was wondering if it was related to  anemia. She states she is always short of breath. She is on chronic oxygen at home.

## 2019-09-11 LAB — MULTIPLE MYELOMA PANEL, SERUM
Albumin SerPl Elph-Mcnc: 4.1 g/dL (ref 2.9–4.4)
Albumin/Glob SerPl: 1.4 (ref 0.7–1.7)
Alpha 1: 0.3 g/dL (ref 0.0–0.4)
Alpha2 Glob SerPl Elph-Mcnc: 0.9 g/dL (ref 0.4–1.0)
B-Globulin SerPl Elph-Mcnc: 1 g/dL (ref 0.7–1.3)
Gamma Glob SerPl Elph-Mcnc: 0.8 g/dL (ref 0.4–1.8)
Globulin, Total: 3.1 g/dL (ref 2.2–3.9)
IgA: 152 mg/dL (ref 64–422)
IgG (Immunoglobin G), Serum: 871 mg/dL (ref 586–1602)
IgM (Immunoglobulin M), Srm: 43 mg/dL (ref 26–217)
Total Protein ELP: 7.2 g/dL (ref 6.0–8.5)

## 2019-09-11 LAB — HAPTOGLOBIN: Haptoglobin: 146 mg/dL (ref 42–346)

## 2019-09-13 ENCOUNTER — Encounter: Payer: Self-pay | Admitting: Oncology

## 2019-09-13 NOTE — Progress Notes (Signed)
Hematology/Oncology Consult note [facility] [phone] [phone]  Patient Care Team: Valerie Roys, DO as PCP - General (Family Medicine) Emmaline Kluver., MD (Rheumatology) Charlie Pitter, MD as Referring Physician (Pulmonary Disease) Ubaldo Glassing Javier Docker, MD as Consulting Physician (Cardiology) Lovell Sheehan, MD as Consulting Physician (Orthopedic Surgery) Jannet Mantis, MD (Dermatology) Vanita Ingles, RN as Case Manager (General Practice) De Hollingshead, [facility] (Pharmacist)   Name of the patient: Melanie Rollins  [id number]  04/10/1946    Reason for referral- anemia   Referring physician- Dr. Bonna Gains  Date of visit: 09/13/19   History of presenting illness- patient is a 74 year old female with a past medical history significant for reknot syndrome, mixed connective tissue disease, pulmonary fibrosis on home oxygen referred for anemia.Most recent CBC from 08/24/2019 showed white cell count of 8.6, H&H of 11.5/35 and a platelet count of 389 with an MCV of 92.  Patient's hemoglobin was normal around 12 up until August 2019 and after that predicted down and has remained between 10-11.  Patient reports joint pain and exertional shortness of breath which is chronic but denies any new complaints at this time  ECOG PS- 1  Pain scale- 0   Review of systems- Review of Systems  Constitutional: Positive for malaise/fatigue. Negative for chills, fever and weight loss.  HENT: Negative for congestion, ear discharge and nosebleeds.   Eyes: Negative for blurred vision.  Respiratory: Positive for shortness of breath. Negative for cough, hemoptysis, sputum production and wheezing.   Cardiovascular: Negative for chest pain, palpitations, orthopnea and claudication.  Gastrointestinal: Negative for abdominal pain, blood in stool, constipation, diarrhea, heartburn, melena, nausea and vomiting.  Genitourinary: Negative for dysuria,  flank pain, frequency, hematuria and urgency.  Musculoskeletal: Negative for back pain, joint pain and myalgias.  Skin: Negative for rash.  Neurological: Negative for dizziness, tingling, focal weakness, seizures, weakness and headaches.  Endo/Heme/Allergies: Does not bruise/bleed easily.  Psychiatric/Behavioral: Negative for depression and suicidal ideas. The patient does not have insomnia.     Allergies  Allergen Reactions  . Imuran [Azathioprine] Nausea Only and Other (See Comments)    Cold chills and bruise like welps    Patient Active Problem List   Diagnosis Date Noted  . Iron deficiency anemia due to chronic blood loss 08/24/2019  . Low back pain 06/08/2019  . Chronic constipation 03/21/2018  . GERD without esophagitis 03/21/2018  . Age-related osteoporosis with current pathological fracture 03/17/2018  . Chronic respiratory failure with hypoxia ([place]) 03/17/2018  . 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 Elevated liver enzymes 12/13/2017  . Coronary artery disease involving native coronary artery of native heart without angina pectoris 10/23/2017  . SOB (shortness of breath) 10/23/2017  . Syncope, near 10/10/2017  . Advanced care planning/counseling discussion 07/06/2016  . 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 Pulmonary fibrosis ([place]) 03/14/2015  . Anxiety and depression 03/14/2015  . Hyperlipidemia 03/14/2015  . Insomnia 03/14/2015  . Osteoarthritis 03/14/2015  . Atrophic vaginitis 03/14/2015  . Connective tissue disease ([place]) 01/27/2014     Past Medical History:  Diagnosis Date  . Arthritis    RA  . Closed left hip fracture ([place]) 03/10/2018  . Hip fracture requiring operative repair [facility])    left hip   . Hyperlipidemia   . Nephrolithiasis 03/14/2015  . Pulmonary fibrosis ([place])   . Reynolds syndrome [facility])      Past Surgical History:  Procedure Laterality Date  .  INTRAMEDULLARY (IM) NAIL INTERTROCHANTERIC Left 03/10/2018    Procedure: INTRAMEDULLARY (IM) NAIL INTERTROCHANTRIC-LEFT TFNA;  Surgeon: Lovell Sheehan, MD;  Location: [facility] ORS;  Service: Orthopedics;  Laterality: Left;  . kidney stones    . LUNG BIOPSY    . SQUAMOUS CELL CARCINOMA EXCISION Left    shin of leg    Social History   Socioeconomic History  . Marital status: Married    Spouse name: Not on file  . Number of children: Not on file  . Years of education: Not on file  . Highest education level: Associate degree: academic program  Occupational History  . Occupation: part time clerical work   Tobacco Use  . Smoking status: Never Smoker  . Smokeless tobacco: Never Used  Substance and Sexual Activity  . Alcohol use: Yes    Alcohol/week: 0.0 standard drinks    Comment: pt states she drinks wine on occasion  . Drug use: No  . Sexual activity: Yes  Other Topics Concern  . Not on file  Social History Narrative   In investment club   Working part time   In lady sorority meet once a month   Stitching group meets couple times a month    Attends church    Social Determinants of Radio broadcast assistant Strain: Low Risk   . Difficulty of Paying Living Expenses: Not hard at all  Food Insecurity: No Food Insecurity  . Worried About Charity fundraiser in the Last Year: Never true  . Ran Out of Food in the Last Year: Never true  Transportation Needs: No Transportation Needs  . Lack of Transportation (Medical): No  . Lack of Transportation (Non-Medical): No  Physical Activity: Inactive  . Days of Exercise per Week: 0 days  . Minutes of Exercise per Session: 0 min  Stress: Stress Concern Present  . Feeling of Stress : To some extent  Social Connections: Not Isolated  . Frequency of Communication with Friends and Family: More than three times a week  . Frequency of Social Gatherings with Friends and Family: More than three times a week  . Attends Religious Services: More than 4 times per year  . Active Member of Clubs or  Organizations: Yes  . Attends Archivist Meetings: More than 4 times per year  . Marital Status: Married  Human resources officer Violence: Not At Risk  . Fear of Current or Ex-Partner: No  . Emotionally Abused: No  . Physically Abused: No  . Sexually Abused: No     Family History  Problem Relation Age of Onset  . Hypertension Mother   . Heart disease Mother        pacemaker and CHF  . Glaucoma Mother   . Hypertension Brother   . Arthritis Sister   . Heart disease Maternal Grandmother   . Heart disease Maternal Grandfather   . Hypertension Paternal Grandmother   . Stroke Paternal Grandmother   . Cancer Paternal Grandfather      Current Outpatient Medications:  .  ACETAMINOPHEN PO, Take 650 mg by mouth in the morning and at bedtime. , Disp: , Rfl:  .  Acetylcysteine (NAC) 600 MG CAPS, Take 1 capsule (600 mg total) by mouth 2 (two) times daily. (Patient taking differently: Take 1,200 mg by mouth daily. ), Disp: 60 capsule, Rfl: 0 .  acyclovir ointment (ZOVIRAX) 5 %, Apply 1 application topically every 3 (three) hours., Disp: 30 g, Rfl: 1 .  baclofen (LIORESAL) 10 MG tablet,  Take 1 tablet (10 mg total) by mouth at bedtime as needed for muscle spasms., Disp: 30 each, Rfl: 0 .  BIOTIN PO, Take 5,000 mcg by mouth daily. , Disp: , Rfl:  .  Calcium Citrate-Vitamin D (CALCIUM + D PO), Take 600 mg by mouth in the morning and at bedtime. , Disp: , Rfl:  .  Cholecalciferol (VITAMIN D) 2000 units CAPS, Take 2,000 Units by mouth daily., Disp: , Rfl:  .  CINNAMON PO, Take 1,000 mg by mouth daily., Disp: , Rfl:  .  denosumab (PROLIA) 60 MG/ML SOSY injection, Inject into the skin., Disp: , Rfl:  .  etodolac (LODINE) 200 MG capsule, 2 (two) times daily. , Disp: , Rfl:  .  hydroxychloroquine (PLAQUENIL) 200 MG tablet, Take 2 tablets (400 mg total) by mouth daily., Disp: 60 tablet, Rfl: 0 .  Multiple Vitamin (MULTIVITAMIN) tablet, Take 1 tablet by mouth daily., Disp: , Rfl:  .  mupirocin  ointment (BACTROBAN) 2 %, Place 1 application into the nose 2 (two) times daily., Disp: 22 g, Rfl: 3 .  mycophenolate (CELLCEPT) 500 MG tablet, Take 2 tablets (1,000 mg total) by mouth 2 (two) times daily., Disp: 120 tablet, Rfl: 0 .  NIFEdipine (PROCARDIA-XL/NIFEDICAL-XL) 30 MG 24 hr tablet, Take 30 mg by mouth daily., Disp: , Rfl:  .  Omega-3 Fatty Acids (FISH OIL) 1000 MG CAPS, Take 1,000 mg by mouth daily., Disp: , Rfl:  .  omeprazole (PRILOSEC) 20 MG capsule, Take 1 capsule (20 mg total) by mouth daily., Disp: 90 capsule, Rfl: 1 .  OXYGEN, 2 L/min by Continuous infusion (non-IV) route daily., Disp: , Rfl:  .  permethrin (ELIMITE) 5 % cream, permethrin 5 % topical cream, Disp: , Rfl:  .  predniSONE (DELTASONE) 10 MG tablet, Take 1 tablet (10 mg total) by mouth daily., Disp: 30 tablet, Rfl: 0 .  sertraline (ZOLOFT) 100 MG tablet, Take 1 tablet (100 mg total) by mouth daily., Disp: 90 tablet, Rfl: 1 .  sulfamethoxazole-trimethoprim (BACTRIM DS) 800-160 MG tablet, Take 1 tablet (160 mg of trimethoprim total) by mouth 3 (three) timesa week, Disp: , Rfl:  .  traMADol (ULTRAM) 50 MG tablet, Take 50 mg by mouth every 6 (six) hours as needed., Disp: , Rfl:  .  zolpidem (AMBIEN) 10 MG tablet, Take 0.5 tablets (5 mg total) by mouth at bedtime., Disp: 30 tablet, Rfl: 1   Physical exam:  Vitals:   09/10/19 1326 09/10/19 1329  BP: 108/76   Pulse:  96  Resp: 18   Temp: 98.4 F (36.9 C)   TempSrc: Oral   SpO2: (!) 85% 100%  Weight: 127 lb (57.6 kg)    Physical Exam Constitutional:      General: She is not in acute distress. Cardiovascular:     Rate and Rhythm: Normal rate and regular rhythm.     Heart sounds: Normal heart sounds.  Pulmonary:     Effort: Pulmonary effort is normal.     Breath sounds: Normal breath sounds.  Abdominal:     General: Bowel sounds are normal.     Palpations: Abdomen is soft.  Skin:    General: Skin is warm and dry.  Neurological:     Mental Status: She is  alert and oriented to person, place, and time.        CMP Latest Ref Rng & Units 09/10/2019  Glucose 70 - 99 mg/dL 121(H)  BUN 8 - 23 mg/dL 14  Creatinine 0.44 - 1.00 mg/dL  0.86  Sodium 135 - 145 mmol/L 137  Potassium 3.5 - 5.1 mmol/L 3.9  Chloride 98 - 111 mmol/L 99  CO2 22 - 32 mmol/L 26  Calcium 8.9 - 10.3 mg/dL 9.8  Total Protein 6.5 - 8.1 g/dL 8.0  Total Bilirubin 0.3 - 1.2 mg/dL 0.5  Alkaline Phos 38 - 126 U/L 58  AST 15 - 41 U/L 31  ALT 0 - 44 U/L 21   CBC Latest Ref Rng & Units 09/10/2019  WBC 4.0 - 10.5 K/uL 10.9(H)  Hemoglobin 12.0 - 15.0 g/dL 12.7  Hematocrit 36.0 - 46.0 % 39.7  Platelets 150 - 400 K/uL 317    No images are attached to the encounter.  No results found.  Assessment and plan- Patient is a 74 y.o. female Referred for normocytic anemia  I suspect patient is anemia secondary to chronic disease.  Regardless her anemia is mild and can be observed without the need for bone marrow biopsy.  Today I will do a complete anemia work-up including CBC with differential, CMP, ferritinB12 and folate, haptoglobin, myeloma panel, reticulocyte count.  I will see the patient back in 2 weeks time for a video visit to discuss the results of blood work   Thank you for this kind referral and the opportunity to participate in the care of this  Patient   Visit Diagnosis 1. Normocytic anemia     Dr. Randa Evens, MD, MPH [facility] [phone] 09/13/2019  6:02 PM

## 2019-09-15 ENCOUNTER — Telehealth: Payer: Medicare Other | Admitting: General Practice

## 2019-09-15 ENCOUNTER — Ambulatory Visit: Payer: Self-pay | Admitting: General Practice

## 2019-09-15 DIAGNOSIS — M1990 Unspecified osteoarthritis, unspecified site: Secondary | ICD-10-CM

## 2019-09-15 DIAGNOSIS — D500 Iron deficiency anemia secondary to blood loss (chronic): Secondary | ICD-10-CM

## 2019-09-15 DIAGNOSIS — I2510 Atherosclerotic heart disease of native coronary artery without angina pectoris: Secondary | ICD-10-CM

## 2019-09-15 DIAGNOSIS — F419 Anxiety disorder, unspecified: Secondary | ICD-10-CM | POA: Diagnosis not present

## 2019-09-15 DIAGNOSIS — F329 Major depressive disorder, single episode, unspecified: Secondary | ICD-10-CM

## 2019-09-15 DIAGNOSIS — E782 Mixed hyperlipidemia: Secondary | ICD-10-CM

## 2019-09-15 NOTE — Patient Instructions (Signed)
Visit Information  Goals Addressed            This Visit's Progress   . RNCM: I am a little stressed out about my upcoming procedure       CARE PLAN ENTRY (see longtitudinal plan of care for additional care plan information)  Current Barriers:  . Chronic Disease Management support, education, and care coordination needs related to CAD, HLD, Anxiety, Depression, Pulmonary Disease, and Chronic hip pain  Clinical Goal(s) related to CAD, HLD, Anxiety, Depression, Pulmonary Disease, and Chronic hip pain :  Over the next 120 days, patient will:  . Work with the care management team to address educational, disease management, and care coordination needs  . Begin or continue self health monitoring activities as directed today  continue to monitor diet, use sodium as instructed and watch fat content . Call provider office for new or worsened signs and symptoms Oxygen saturation lower than established parameter, Shortness of breath, and New or worsened symptom related to Chronic hip pain and other chronic conditions . Call care management team with questions or concerns . Verbalize basic understanding of patient centered plan of care established today  Interventions related to CAD, HLD, Anxiety, Depression, Pulmonary Disease, and Chronic Hip Pain :  . Evaluation of current treatment plans and patient's adherence to plan as established by provider . Assessed patient understanding of disease states.  The patient has a good understanding of her chronic conditions. Says her anxiety is better since there is a plan in place to find out about her anemia . Assessed patient's education and care coordination needs. Denies any needs at this time. Knows there are resources available  . Provided disease specific education to patient.  [name] with appropriate clinical care team members regarding patient needs . Pharmacy referral for poly pharmacy, if she should take Magnesium and if any of her  medications cause blurred vision- completed   Patient Self Care Activities related to CAD, HLD, Anxiety, Depression, Pulmonary Disease, and Chronic Hip Pain :  . Patient is unable to independently self-manage chronic health conditions  Please see past updates related to this goal by clicking on the "Past Updates" button in the selected goal      . RNCM: Pt -"Hopefully soon they will find out the reason for my anemia" (pt-stated)       CARE PLAN ENTRY (see longitudinal plan of care for additional care plan information)  Current Barriers:  Marland Kitchen Knowledge Deficits related to upcoming procedures EGD and colonoscopy in hopes of finding out the source of her anemia . Chronic Disease Management support and education needs related to chronic anemia  Nurse Case Manager Clinical Goal(s):  Marland Kitchen Over the next 90 days, patient will verbalize understanding of plan for completion of colonoscopy and EGD in hopes of finding the source of her anemia . Over the next 90 days, patient will work with [place], GI specialist, and pcp to address needs related to chronic anemia and etiology  . Over the next 90 days, patient will attend all scheduled medical appointments: upcoming EGD and colonoscopy for June 2021  Interventions:  . Inter-disciplinary care team collaboration (see longitudinal plan of care) . Evaluation of current treatment plan related to chronic anemia and patient's adherence to plan as established by provider. . Advised patient to call RNCM for any education needs or needs before next outreach . Provided education to patient re: colonoscopy prep . Discussed plans with patient for ongoing care management follow up and provided patient  with direct contact information for care management team . Reviewed scheduled/upcoming provider appointments including: June 2021 patient will have a colonoscopy and EGD.  Next pcp appointment 02-23-2020  Patient Self Care Activities:  . Patient verbalizes understanding of  plan to completed additional scheduled testing in hopes of finding the etiology of her chronic anemia . Attends all scheduled provider appointments . Calls provider office for new concerns or questions . Unable to independently manage chronic anemia   Initial goal documentation        Patient verbalizes understanding of instructions provided today.   The care management team will reach out to the patient again over the next 60 to 90 days.   Noreene Larsson RN, MSN, [facility] Mobile: [phone]

## 2019-09-15 NOTE — Chronic Care Management (AMB) (Signed)
Chronic Care Management   Follow Up Note   09/15/2019 Name: Melanie Rollins MRN: [id number] DOB: [birth year]  Referred by: Valerie Roys, DO Reason for referral : Chronic Care Management (RNCM Chronic Disease Management and Care Coordination Needs )   Melanie Rollins is a 74 y.o. year old female who is a primary care patient of Valerie Roys, DO. The CCM team was consulted for assistance with chronic disease management and care coordination needs.    Review of patient status, including review of consultants reports, relevant laboratory and other test results, and collaboration with appropriate care team members and the patient's provider was performed as part of comprehensive patient evaluation and provision of chronic care management services.    SDOH (Social Determinants of Health) assessments performed: No See Care Plan activities for detailed interventions related to [facility])     Outpatient Encounter Medications as of 09/15/2019  Medication Sig Note  . ACETAMINOPHEN PO Take 650 mg by mouth in the morning and at bedtime.    . Acetylcysteine (NAC) 600 MG CAPS Take 1 capsule (600 mg total) by mouth 2 (two) times daily. (Patient taking differently: Take 1,200 mg by mouth daily. )   . acyclovir ointment (ZOVIRAX) 5 % Apply 1 application topically every 3 (three) hours. 09/01/2019: Using PRN cold sores   . baclofen (LIORESAL) 10 MG tablet Take 1 tablet (10 mg total) by mouth at bedtime as needed for muscle spasms. 09/01/2019: Using ~couple times weekly  . BIOTIN PO Take 5,000 mcg by mouth daily.    . Calcium Citrate-Vitamin D (CALCIUM + D PO) Take 600 mg by mouth in the morning and at bedtime.    . Cholecalciferol (VITAMIN D) 2000 units CAPS Take 2,000 Units by mouth daily.   Marland Kitchen CINNAMON PO Take 1,000 mg by mouth daily.   Marland Kitchen denosumab (PROLIA) 60 MG/ML SOSY injection Inject into the skin.   Marland Kitchen etodolac (LODINE) 200 MG capsule 2 (two) times daily.    . hydroxychloroquine (PLAQUENIL) 200 MG  tablet Take 2 tablets (400 mg total) by mouth daily.   . Multiple Vitamin (MULTIVITAMIN) tablet Take 1 tablet by mouth daily.   . mupirocin ointment (BACTROBAN) 2 % Place 1 application into the nose 2 (two) times daily.   . mycophenolate (CELLCEPT) 500 MG tablet Take 2 tablets (1,000 mg total) by mouth 2 (two) times daily.   Marland Kitchen NIFEdipine (PROCARDIA-XL/NIFEDICAL-XL) 30 MG 24 hr tablet Take 30 mg by mouth daily.   . Omega-3 Fatty Acids (FISH OIL) 1000 MG CAPS Take 1,000 mg by mouth daily.   Marland Kitchen omeprazole (PRILOSEC) 20 MG capsule Take 1 capsule (20 mg total) by mouth daily.   . OXYGEN 2 L/min by Continuous infusion (non-IV) route daily.   . permethrin (ELIMITE) 5 % cream permethrin 5 % topical cream 09/01/2019: For rosacea  . predniSONE (DELTASONE) 10 MG tablet Take 1 tablet (10 mg total) by mouth daily.   . sertraline (ZOLOFT) 100 MG tablet Take 1 tablet (100 mg total) by mouth daily.   Marland Kitchen sulfamethoxazole-trimethoprim (BACTRIM DS) 800-160 MG tablet Take 1 tablet (160 mg of trimethoprim total) by mouth 3 (three) timesa week   . traMADol (ULTRAM) 50 MG tablet Take 50 mg by mouth every 6 (six) hours as needed. 09/01/2019: ~couple times weekly  . zolpidem (AMBIEN) 10 MG tablet Take 0.5 tablets (5 mg total) by mouth at bedtime.    No facility-administered encounter medications on file as of 09/15/2019.     Objective:  Goals Addressed            This Visit's Progress   . RNCM: I am a little stressed out about my upcoming procedure       CARE PLAN ENTRY (see longtitudinal plan of care for additional care plan information)  Current Barriers:  . Chronic Disease Management support, education, and care coordination needs related to CAD, HLD, Anxiety, Depression, Pulmonary Disease, and Chronic hip pain  Clinical Goal(s) related to CAD, HLD, Anxiety, Depression, Pulmonary Disease, and Chronic hip pain :  Over the next 120 days, patient will:  . Work with the care management team to address  educational, disease management, and care coordination needs  . Begin or continue self health monitoring activities as directed today  continue to monitor diet, use sodium as instructed and watch fat content . Call provider office for new or worsened signs and symptoms Oxygen saturation lower than established parameter, Shortness of breath, and New or worsened symptom related to Chronic hip pain and other chronic conditions . Call care management team with questions or concerns . Verbalize basic understanding of patient centered plan of care established today  Interventions related to CAD, HLD, Anxiety, Depression, Pulmonary Disease, and Chronic Hip Pain :  . Evaluation of current treatment plans and patient's adherence to plan as established by provider . Assessed patient understanding of disease states.  The patient has a good understanding of her chronic conditions. Says her anxiety is better since there is a plan in place to find out about her anemia . Assessed patient's education and care coordination needs. Denies any needs at this time. Knows there are resources available  . Provided disease specific education to patient.  [name] with appropriate clinical care team members regarding patient needs . Pharmacy referral for poly pharmacy, if she should take Magnesium and if any of her medications cause blurred vision- completed   Patient Self Care Activities related to CAD, HLD, Anxiety, Depression, Pulmonary Disease, and Chronic Hip Pain :  . Patient is unable to independently self-manage chronic health conditions  Please see past updates related to this goal by clicking on the "Past Updates" button in the selected goal      . RNCM: Pt -"Hopefully soon they will find out the reason for my anemia" (pt-stated)       CARE PLAN ENTRY (see longitudinal plan of care for additional care plan information)  Current Barriers:  Marland Kitchen Knowledge Deficits related to upcoming procedures EGD and  colonoscopy in hopes of finding out the source of her anemia . Chronic Disease Management support and education needs related to chronic anemia  Nurse Case Manager Clinical Goal(s):  Marland Kitchen Over the next 90 days, patient will verbalize understanding of plan for completion of colonoscopy and EGD in hopes of finding the source of her anemia . Over the next 90 days, patient will work with [place], GI specialist, and pcp to address needs related to chronic anemia and etiology  . Over the next 90 days, patient will attend all scheduled medical appointments: upcoming EGD and colonoscopy for June 2021  Interventions:  . Inter-disciplinary care team collaboration (see longitudinal plan of care) . Evaluation of current treatment plan related to chronic anemia and patient's adherence to plan as established by provider. . Advised patient to call RNCM for any education needs or needs before next outreach . Provided education to patient re: colonoscopy prep . Discussed plans with patient for ongoing care management follow up and provided patient with direct  contact information for care management team . Reviewed scheduled/upcoming provider appointments including: June 2021 patient will have a colonoscopy and EGD.  Next pcp appointment 02-23-2020  Patient Self Care Activities:  . Patient verbalizes understanding of plan to completed additional scheduled testing in hopes of finding the etiology of her chronic anemia . Attends all scheduled provider appointments . Calls provider office for new concerns or questions . Unable to independently manage chronic anemia   Initial goal documentation         Plan:   The care management team will reach out to the patient again over the next 60 to 90 days days.    Noreene Larsson RN, MSN, [facility] Mobile: [phone]

## 2019-09-25 ENCOUNTER — Other Ambulatory Visit: Payer: Self-pay

## 2019-09-25 ENCOUNTER — Inpatient Hospital Stay: Payer: Medicare Other | Attending: Oncology | Admitting: Oncology

## 2019-09-25 DIAGNOSIS — D649 Anemia, unspecified: Secondary | ICD-10-CM

## 2019-09-25 DIAGNOSIS — I2510 Atherosclerotic heart disease of native coronary artery without angina pectoris: Secondary | ICD-10-CM

## 2019-09-29 ENCOUNTER — Other Ambulatory Visit
Admission: RE | Admit: 2019-09-29 | Discharge: 2019-09-29 | Disposition: A | Discharge status: Home / Self Care | Payer: Medicare Other | Source: Ambulatory Visit | Attending: Gastroenterology | Admitting: Gastroenterology

## 2019-09-29 DIAGNOSIS — Z01812 Encounter for preprocedural laboratory examination: Secondary | ICD-10-CM | POA: Insufficient documentation

## 2019-09-29 DIAGNOSIS — Z20822 Contact with and (suspected) exposure to covid-19: Secondary | ICD-10-CM | POA: Diagnosis not present

## 2019-09-29 NOTE — Progress Notes (Signed)
I connected with Melanie Rollins on 09/29/19 at  1:15 PM EDT by video enabled telemedicine visit and verified that I am speaking with the correct person using two identifiers.   I discussed the limitations, risks, security and privacy concerns of performing an evaluation and management service by telemedicine and the availability of in-person appointments. I also discussed with the patient that there may be a patient responsible charge related to this service. The patient expressed understanding and agreed to proceed.  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  Patient's hemoglobin was normal around 12 up until August 2019 and after that predicted down and has remained between 10-11.   Results of blood work from 09/10/2019 showed H&H of 12.7/39.7 white count of 10.9 and platelet count of 317.  Iron studies, B12, folate, CMP, myeloma panel and reticulocyte count and haptoglobin was unremarkable.  Interval history: Patient has baseline fatigue and exertional shortness of breath.  She is on home oxygen.  No acute issues since last visit   Review of Systems  Constitutional: Positive for malaise/fatigue. Negative for chills, fever and weight loss.  HENT: Negative for congestion, ear discharge and nosebleeds.   Eyes: Negative for blurred vision.  Respiratory: Positive for shortness of breath. Negative for cough, hemoptysis, sputum production and wheezing.   Cardiovascular: Negative for chest pain, palpitations, orthopnea and claudication.   Gastrointestinal: Negative for abdominal pain, blood in stool, constipation, diarrhea, heartburn, melena, nausea and vomiting.  Genitourinary: Negative for dysuria, flank pain, frequency, hematuria and urgency.  Musculoskeletal: Negative for back pain, joint pain and myalgias.  Skin: Negative for rash.  Neurological: Negative for dizziness, tingling, focal weakness, seizures, weakness and headaches.  Endo/Heme/Allergies: Does not bruise/bleed easily.  Psychiatric/Behavioral: Negative for depression and suicidal ideas. The patient does not have insomnia.     Allergies  Allergen Reactions  . Imuran [Azathioprine] Nausea Only and Other (See Comments)    Cold chills and bruise like welps    Past Medical History:  Diagnosis Date  . Arthritis    RA  . Closed left hip fracture ([place]) 03/10/2018  . Hip fracture requiring operative repair [facility])    left hip   . Hyperlipidemia   . Nephrolithiasis 03/14/2015  . Pulmonary fibrosis ([place])   . Reynolds syndrome [facility])     Past Surgical History:  Procedure Laterality Date  . INTRAMEDULLARY (IM) NAIL INTERTROCHANTERIC Left 03/10/2018   Procedure: INTRAMEDULLARY (IM) NAIL INTERTROCHANTRIC-LEFT [facility];  Surgeon: Lovell Sheehan, MD;  Location: [facility] ORS;  Service: Orthopedics;  Laterality: Left;  . kidney stones    . LUNG BIOPSY    . SQUAMOUS CELL CARCINOMA EXCISION Left    shin of leg    Social History   Socioeconomic History  . Marital status: Married    Spouse name: Not on file  . Number of children: Not on file  . Years of education: Not on file  . Highest education level: Associate degree: academic program  Occupational History  . Occupation: part time clerical work   Tobacco Use  .  Smoking status: Never Smoker  . Smokeless tobacco: Never Used  Substance and Sexual Activity  . Alcohol use: Yes    Alcohol/week: 0.0 standard drinks    Comment: pt states she drinks wine on occasion  . Drug use: No  . Sexual activity: Yes  Other  Topics Concern  . Not on file  Social History Narrative   In investment club   Working part time   In lady sorority meet once a month   Stitching group meets couple times a month    Attends church    Social Determinants of Radio broadcast assistant Strain: Low Risk   . Difficulty of Paying Living Expenses: Not hard at all  Food Insecurity: No Food Insecurity  . Worried About Charity fundraiser in the Last Year: Never true  . Ran Out of Food in the Last Year: Never true  Transportation Needs: No Transportation Needs  . Lack of Transportation (Medical): No  . Lack of Transportation (Non-Medical): No  Physical Activity: Inactive  . Days of Exercise per Week: 0 days  . Minutes of Exercise per Session: 0 min  Stress: Stress Concern Present  . Feeling of Stress : To some extent  Social Connections: Not Isolated  . Frequency of Communication with Friends and Family: More than three times a week  . Frequency of Social Gatherings with Friends and Family: More than three times a week  . Attends Religious Services: More than 4 times per year  . Active Member of Clubs or Organizations: Yes  . Attends Archivist Meetings: More than 4 times per year  . Marital Status: Married  Human resources officer Violence: Not At Risk  . Fear of Current or Ex-Partner: No  . Emotionally Abused: No  . Physically Abused: No  . Sexually Abused: No    Family History  Problem Relation Age of Onset  . Hypertension Mother   . Heart disease Mother        pacemaker and CHF  . Glaucoma Mother   . Hypertension Brother   . Arthritis Sister   . Heart disease Maternal Grandmother   . Heart disease Maternal Grandfather   . Hypertension Paternal Grandmother   . Stroke Paternal Grandmother   . Cancer Paternal Grandfather      Current Outpatient Medications:  .  ACETAMINOPHEN PO, Take 650 mg by mouth in the morning and at bedtime. , Disp: , Rfl:  .  Acetylcysteine (NAC) 600 MG CAPS, Take 1 capsule  (600 mg total) by mouth 2 (two) times daily. (Patient taking differently: Take 1,200 mg by mouth daily. ), Disp: 60 capsule, Rfl: 0 .  acyclovir ointment (ZOVIRAX) 5 %, Apply 1 application topically every 3 (three) hours., Disp: 30 g, Rfl: 1 .  baclofen (LIORESAL) 10 MG tablet, Take 1 tablet (10 mg total) by mouth at bedtime as needed for muscle spasms., Disp: 30 each, Rfl: 0 .  BIOTIN PO, Take 5,000 mcg by mouth daily. , Disp: , Rfl:  .  Calcium Citrate-Vitamin D (CALCIUM + D PO), Take 600 mg by mouth in the morning and at bedtime. , Disp: , Rfl:  .  Cholecalciferol (VITAMIN D) 2000 units CAPS, Take 2,000 Units by mouth daily., Disp: , Rfl:  .  CINNAMON PO, Take 1,000 mg by mouth daily., Disp: , Rfl:  .  denosumab (PROLIA) 60 MG/ML SOSY injection, Inject into the skin., Disp: , Rfl:  .  etodolac (LODINE) 200 MG capsule, 2 (two) times daily. ,  Disp: , Rfl:  .  hydroxychloroquine (PLAQUENIL) 200 MG tablet, Take 2 tablets (400 mg total) by mouth daily., Disp: 60 tablet, Rfl: 0 .  Multiple Vitamin (MULTIVITAMIN) tablet, Take 1 tablet by mouth daily., Disp: , Rfl:  .  mupirocin ointment (BACTROBAN) 2 %, Place 1 application into the nose 2 (two) times daily., Disp: 22 g, Rfl: 3 .  mycophenolate (CELLCEPT) 500 MG tablet, Take 2 tablets (1,000 mg total) by mouth 2 (two) times daily., Disp: 120 tablet, Rfl: 0 .  NIFEdipine (PROCARDIA-XL/NIFEDICAL-XL) 30 MG 24 hr tablet, Take 30 mg by mouth daily., Disp: , Rfl:  .  Omega-3 Fatty Acids (FISH OIL) 1000 MG CAPS, Take 1,000 mg by mouth daily., Disp: , Rfl:  .  omeprazole (PRILOSEC) 20 MG capsule, Take 1 capsule (20 mg total) by mouth daily., Disp: 90 capsule, Rfl: 1 .  OXYGEN, 2 L/min by Continuous infusion (non-IV) route daily., Disp: , Rfl:  .  permethrin (ELIMITE) 5 % cream, permethrin 5 % topical cream, Disp: , Rfl:  .  predniSONE (DELTASONE) 10 MG tablet, Take 1 tablet (10 mg total) by mouth daily., Disp: 30 tablet, Rfl: 0 .  sertraline (ZOLOFT) 100 MG  tablet, Take 1 tablet (100 mg total) by mouth daily., Disp: 90 tablet, Rfl: 1 .  sulfamethoxazole-trimethoprim (BACTRIM DS) 800-160 MG tablet, Take 1 tablet (160 mg of trimethoprim total) by mouth 3 (three) timesa week, Disp: , Rfl:  .  traMADol (ULTRAM) 50 MG tablet, Take 50 mg by mouth every 6 (six) hours as needed., Disp: , Rfl:  .  zolpidem (AMBIEN) 10 MG tablet, Take 0.5 tablets (5 mg total) by mouth at bedtime., Disp: 30 tablet, Rfl: 1  No results found.  No images are attached to the encounter.   CMP Latest Ref Rng & Units 09/10/2019  Glucose 70 - 99 mg/dL 121(H)  BUN 8 - 23 mg/dL 14  Creatinine 0.44 - 1.00 mg/dL 0.86  Sodium 135 - 145 mmol/L 137  Potassium 3.5 - 5.1 mmol/L 3.9  Chloride 98 - 111 mmol/L 99  CO2 22 - 32 mmol/L 26  Calcium 8.9 - 10.3 mg/dL 9.8  Total Protein 6.5 - 8.1 g/dL 8.0  Total Bilirubin 0.3 - 1.2 mg/dL 0.5  Alkaline Phos 38 - 126 U/L 58  AST 15 - 41 U/L 31  ALT 0 - 44 U/L 21   CBC Latest Ref Rng & Units 09/10/2019  WBC 4.0 - 10.5 K/uL 10.9(H)  Hemoglobin 12.0 - 15.0 g/dL 12.7  Hematocrit 36.0 - 46.0 % 39.7  Platelets 150 - 400 K/uL 317     Observation/objective: Appears in no acute distress over video visit today.  Breathing is nonlabored  Assessment and plan:Patient is a 74 year old female referred for normocytic anemia possibly secondary to chronic disease  I discussed the results of blood work with the patient.  Repeat CBC shows no evidence of anemia and her hemoglobin is improved to 12.7.  No reversible cause of anemia found and iron studies B12 folate haptoglobin reticulocyte count and myeloma panel are all unremarkable.  Follow-up instructions: Patient can continue to follow-up with Dr. Wynetta Emery at this time and if her anemia recurs or if it is trending down she can be referred to [place] in the future  I discussed the assessment and treatment plan with the patient. The patient was provided an opportunity to ask questions and all were answered. The  patient agreed with the plan and demonstrated an understanding of the instructions.   The  patient was advised to call back or seek an in-person evaluation if the symptoms worsen or if the condition fails to improve as anticipated.   Visit Diagnosis: 1. Normocytic anemia     Dr. Randa Evens, MD, MPH [facility] Tel- [phone] 09/29/2019 1:01 PM

## 2019-09-30 LAB — SARS CORONAVIRUS 2 (TAT 6-24 HRS): SARS Coronavirus 2: NEGATIVE

## 2019-10-01 ENCOUNTER — Encounter: Payer: Self-pay | Admitting: Gastroenterology

## 2019-10-01 ENCOUNTER — Encounter
Admission: RE | Disposition: A | Discharge status: Home / Self Care | Payer: Self-pay | Source: Home / Self Care | Attending: Gastroenterology

## 2019-10-01 ENCOUNTER — Ambulatory Visit: Payer: Medicare Other | Admitting: Certified Registered"

## 2019-10-01 ENCOUNTER — Ambulatory Visit
Admission: RE | Admit: 2019-10-01 | Discharge: 2019-10-01 | Disposition: A | Discharge status: Home / Self Care | Payer: Medicare Other | Attending: Gastroenterology | Admitting: Gastroenterology

## 2019-10-01 DIAGNOSIS — Z79899 Other long term (current) drug therapy: Secondary | ICD-10-CM | POA: Insufficient documentation

## 2019-10-01 DIAGNOSIS — K639 Disease of intestine, unspecified: Secondary | ICD-10-CM

## 2019-10-01 DIAGNOSIS — K317 Polyp of stomach and duodenum: Secondary | ICD-10-CM

## 2019-10-01 DIAGNOSIS — K649 Unspecified hemorrhoids: Secondary | ICD-10-CM

## 2019-10-01 DIAGNOSIS — Z888 Allergy status to other drugs, medicaments and biological substances status: Secondary | ICD-10-CM | POA: Insufficient documentation

## 2019-10-01 DIAGNOSIS — K5730 Diverticulosis of large intestine without perforation or abscess without bleeding: Secondary | ICD-10-CM | POA: Diagnosis not present

## 2019-10-01 DIAGNOSIS — F418 Other specified anxiety disorders: Secondary | ICD-10-CM | POA: Diagnosis not present

## 2019-10-01 DIAGNOSIS — K648 Other hemorrhoids: Secondary | ICD-10-CM | POA: Insufficient documentation

## 2019-10-01 DIAGNOSIS — Z823 Family history of stroke: Secondary | ICD-10-CM | POA: Insufficient documentation

## 2019-10-01 DIAGNOSIS — Z8249 Family history of ischemic heart disease and other diseases of the circulatory system: Secondary | ICD-10-CM | POA: Diagnosis not present

## 2019-10-01 DIAGNOSIS — Z87442 Personal history of urinary calculi: Secondary | ICD-10-CM | POA: Insufficient documentation

## 2019-10-01 DIAGNOSIS — K2100 Gastro-esophageal reflux disease with esophagitis, without bleeding: Secondary | ICD-10-CM | POA: Insufficient documentation

## 2019-10-01 DIAGNOSIS — R110 Nausea: Secondary | ICD-10-CM | POA: Diagnosis not present

## 2019-10-01 DIAGNOSIS — Z7952 Long term (current) use of systemic steroids: Secondary | ICD-10-CM | POA: Diagnosis not present

## 2019-10-01 DIAGNOSIS — E785 Hyperlipidemia, unspecified: Secondary | ICD-10-CM | POA: Diagnosis not present

## 2019-10-01 DIAGNOSIS — Z809 Family history of malignant neoplasm, unspecified: Secondary | ICD-10-CM | POA: Insufficient documentation

## 2019-10-01 DIAGNOSIS — Z85828 Personal history of other malignant neoplasm of skin: Secondary | ICD-10-CM | POA: Diagnosis not present

## 2019-10-01 DIAGNOSIS — K6389 Other specified diseases of intestine: Secondary | ICD-10-CM | POA: Diagnosis not present

## 2019-10-01 DIAGNOSIS — R195 Other fecal abnormalities: Secondary | ICD-10-CM

## 2019-10-01 DIAGNOSIS — K644 Residual hemorrhoidal skin tags: Secondary | ICD-10-CM | POA: Insufficient documentation

## 2019-10-01 DIAGNOSIS — Z8261 Family history of arthritis: Secondary | ICD-10-CM | POA: Insufficient documentation

## 2019-10-01 DIAGNOSIS — I2510 Atherosclerotic heart disease of native coronary artery without angina pectoris: Secondary | ICD-10-CM | POA: Diagnosis not present

## 2019-10-01 DIAGNOSIS — Z83511 Family history of glaucoma: Secondary | ICD-10-CM | POA: Diagnosis not present

## 2019-10-01 DIAGNOSIS — K5790 Diverticulosis of intestine, part unspecified, without perforation or abscess without bleeding: Secondary | ICD-10-CM | POA: Diagnosis not present

## 2019-10-01 DIAGNOSIS — M069 Rheumatoid arthritis, unspecified: Secondary | ICD-10-CM | POA: Diagnosis not present

## 2019-10-01 DIAGNOSIS — J8410 Pulmonary fibrosis, unspecified: Secondary | ICD-10-CM | POA: Diagnosis not present

## 2019-10-01 DIAGNOSIS — R109 Unspecified abdominal pain: Secondary | ICD-10-CM

## 2019-10-01 HISTORY — PX: ESOPHAGOGASTRODUODENOSCOPY (EGD) WITH PROPOFOL: SHX5813

## 2019-10-01 HISTORY — PX: COLONOSCOPY WITH PROPOFOL: SHX5780

## 2019-10-01 SURGERY — COLONOSCOPY WITH PROPOFOL
Anesthesia: General

## 2019-10-01 MED ORDER — PROPOFOL 500 MG/50ML IV EMUL
INTRAVENOUS | Status: DC | PRN
  Administered 2019-10-01: 125 ug/kg/min via INTRAVENOUS

## 2019-10-01 MED ORDER — PROPOFOL 10 MG/ML IV BOLUS
INTRAVENOUS | Status: DC | PRN
  Administered 2019-10-01 (×2): 50 mg via INTRAVENOUS

## 2019-10-01 MED ORDER — ONDANSETRON HCL 4 MG/2ML IJ SOLN
INTRAMUSCULAR | Status: DC | PRN
  Administered 2019-10-01: 4 mg via INTRAVENOUS

## 2019-10-01 MED ORDER — SODIUM CHLORIDE 0.9 % IV SOLN: INTRAVENOUS | Status: DC

## 2019-10-01 MED ORDER — ONDANSETRON HCL 4 MG/2ML IJ SOLN
INTRAMUSCULAR | Status: AC
  Filled 2019-10-01: qty 2

## 2019-10-01 MED ORDER — PROPOFOL 10 MG/ML IV BOLUS
INTRAVENOUS | Status: AC
  Filled 2019-10-01: qty 20

## 2019-10-01 MED ORDER — PROPOFOL 500 MG/50ML IV EMUL
INTRAVENOUS | Status: AC
  Filled 2019-10-01: qty 50

## 2019-10-01 MED ORDER — OMEPRAZOLE 20 MG PO CPDR: 20.0000 mg | DELAYED_RELEASE_CAPSULE | Freq: Every day | ORAL | 0 refills | Status: DC

## 2019-10-01 NOTE — Op Note (Addendum)
[facility] Gastroenterology Patient Name: Melanie Rollins Procedure Date: 10/01/2019 8:46 AM MRN: [id number] Account #: [id number] Date of Birth: Jun 30, 1945 Admit Type: Outpatient Age: 74 Room: [facility] ENDO ROOM 1 Gender: Female Note Status: Finalized Procedure:             Upper GI endoscopy Indications:           Abdominal pain Providers:             Jericka Kadar B. Bonna Gains MD, MD Medicines:             Monitored Anesthesia Care Complications:         No immediate complications. Procedure:             Pre-Anesthesia Assessment:                        - Prior to the procedure, a History and Physical was                         performed, and patient medications, allergies and                         sensitivities were reviewed. The patient's tolerance                         of previous anesthesia was reviewed.                        - The risks and benefits of the procedure and the                         sedation options and risks were discussed with the                         patient. All questions were answered and informed                         consent was obtained.                        - Patient identification and proposed procedure were                         verified prior to the procedure by the physician, the                         nurse, the anesthesiologist, the anesthetist and the                         technician. The procedure was verified in the                         procedure room.                        - ASA Grade Assessment: II - A patient with mild                         systemic disease.  After obtaining informed consent, the endoscope was                         passed under direct vision. Throughout the procedure,                         the patient's blood pressure, pulse, and oxygen                         saturations were monitored continuously. The Endoscope                         was introduced through the  mouth, and advanced to the                         second part of duodenum. The upper GI endoscopy was                         accomplished with ease. The patient tolerated the                         procedure well. Findings:      LA Grade A (one or more mucosal breaks less than 5 mm, not extending       between tops of 2 mucosal folds) esophagitis with no bleeding was found       at the gastroesophageal junction.      The exam of the esophagus was otherwise normal.      The entire examined stomach was normal. Biopsies were obtained in the       gastric body, at the incisura and in the gastric antrum with cold       forceps for histology. Biopsies were taken with a cold forceps for       Helicobacter pylori testing.      Multiple 3 to 6 mm sessile polyps with no bleeding and no stigmata of       recent bleeding were found in the gastric body. Biopsies were taken with       a cold forceps for histology.      The duodenal bulb, second portion of the duodenum and examined duodenum       were normal. Impression:            - LA Grade A reflux esophagitis with no bleeding.                        - Normal stomach. Biopsied.                        - Multiple gastric polyps. Biopsied.                        - Normal duodenal bulb, second portion of the duodenum                         and examined duodenum.                        - Biopsies were obtained in the gastric body, at the  incisura and in the gastric antrum. Recommendation:        - Await pathology results.                        - Discharge patient to home (with escort).                        - Advance diet as tolerated.                        - Continue present medications.                        - Patient has a contact number available for                         emergencies. The signs and symptoms of potential                         delayed complications were discussed with the patient.                          Return to normal activities tomorrow. Written                         discharge instructions were provided to the patient.                        - Discharge patient to home (with escort).                        - The findings and recommendations were discussed with                         the patient.                        - The findings and recommendations were discussed with                         the patient's family.                        - Follow an antireflux regimen.                        - Take prescribed proton pump inhibitor or H2 blocker                         (antacid) medications 30 - 60 minutes before meals. Procedure Code(s):     --- Professional ---                        [phone], Esophagogastroduodenoscopy, flexible,                         transoral; with biopsy, single or multiple Diagnosis Code(s):     --- Professional ---                        K21.00  K31.7, Polyp of stomach and duodenum                        R10.9, Unspecified abdominal pain CPT copyright 2019 American Medical Association. All rights reserved. The codes documented in this report are preliminary and upon coder review may  be revised to meet current compliance requirements.  Vonda Antigua, MD Margretta Sidle B. Bonna Gains MD, MD 10/01/2019 9:16:18 AM This report has been signed electronically. Number of Addenda: 0 Note Initiated On: 10/01/2019 8:46 AM Estimated Blood Loss:  Estimated blood loss: none.      [facility]

## 2019-10-01 NOTE — Anesthesia Preprocedure Evaluation (Signed)
Anesthesia Evaluation  Patient identified by MRN, date of birth, ID band Patient awake    Reviewed: Allergy & Precautions, NPO status , Patient's Chart, lab work & pertinent test results  History of Anesthesia Complications Negative for: history of anesthetic complications  Airway Mallampati: I  TM Distance: >3 FB Neck ROM: Full    Dental  (+) Dental Advidsory Given, Caps, Teeth Intact   Pulmonary shortness of breath (2L at home) and Long-Term Oxygen Therapy, neg sleep apnea, neg COPD, neg recent URI,  Pulmonary fibrosis, on home O2, primarily uses 2L at night but also uses O2 during the day if she is particularly active   breath sounds clear to auscultation- rhonchi (-) wheezing      Cardiovascular Exercise Tolerance: Poor (-) hypertension(-) CAD, (-) Past MI, (-) Cardiac Stents and (-) CABG negative cardio ROS   Rhythm:Regular Rate:Normal - Systolic murmurs and - Diastolic murmurs    Neuro/Psych PSYCHIATRIC DISORDERS Anxiety Depression negative neurological ROS     GI/Hepatic Neg liver ROS, GERD  ,  Endo/Other  negative endocrine ROSneg diabetes  Renal/GU Renal disease (hx of nephrolithiasis)     Musculoskeletal  (+) Arthritis ,   Abdominal (+) - obese,   Peds  Hematology negative hematology ROS (+)   Anesthesia Other Findings Past Medical History: No date: Arthritis     Comment:  RA No date: Hyperlipidemia No date: Pulmonary fibrosis (HCC) 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[facility])   Reproductive/Obstetrics                             Lab Results  Component Value Date   WBC 10.9 (H) 09/10/2019   HGB 12.7 09/10/2019   HCT 39.7 09/10/2019   MCV 94.5 09/10/2019   PLT 317 09/10/2019    Anesthesia Physical  Anesthesia Plan  ASA: III  Anesthesia Plan: General   Post-op Pain Management:    Induction: Intravenous  PONV Risk Score and Plan: 2 and Propofol infusion and TIVA  Airway  Management Planned: Natural Airway and Nasal Cannula  Additional Equipment:   Intra-op Plan:   Post-operative Plan:   Informed Consent: I have reviewed the patients History and Physical, chart, labs and discussed the procedure including the risks, benefits and alternatives for the proposed anesthesia with the patient or authorized representative who has indicated his/her understanding and acceptance.     Dental advisory given  Plan Discussed with: CRNA and Anesthesiologist  Anesthesia Plan Comments:         Anesthesia Quick Evaluation

## 2019-10-01 NOTE — Op Note (Addendum)
[facility] Gastroenterology Patient Name: Melanie Rollins Procedure Date: 10/01/2019 8:45 AM MRN: [id number] Account #: [id number] Date of Birth: 1945/08/29 Admit Type: Outpatient Age: 74 Room: [facility] ENDO ROOM 1 Gender: Female Note Status: Finalized Procedure:             Colonoscopy Indications:           Gastrointestinal occult blood loss Providers:             Lanie Schelling B. Bonna Gains MD, MD Medicines:             Monitored Anesthesia Care Complications:         No immediate complications. Procedure:             Pre-Anesthesia Assessment:                        - Prior to the procedure, a History and Physical was                         performed, and patient medications, allergies and                         sensitivities were reviewed. The patient's tolerance                         of previous anesthesia was reviewed.                        - The risks and benefits of the procedure and the                         sedation options and risks were discussed with the                         patient. All questions were answered and informed                         consent was obtained.                        - Patient identification and proposed procedure were                         verified prior to the procedure by the physician, the                         nurse, the anesthetist and the technician. The                         procedure was verified in the pre-procedure area in                         the procedure room in the endoscopy suite.                        - ASA Grade Assessment: II - A patient with mild                         systemic disease.                        -  After reviewing the risks and benefits, the patient                         was deemed in satisfactory condition to undergo the                         procedure.                        After obtaining informed consent, the colonoscope was                         passed under direct vision.  Throughout the procedure,                         the patient's blood pressure, pulse, and oxygen                         saturations were monitored continuously. The                         Colonoscope was introduced through the anus and                         advanced to the the cecum, identified by appendiceal                         orifice and ileocecal valve. The colonoscopy was                         performed with ease. The patient tolerated the                         procedure well. The quality of the bowel preparation                         was fair. Findings:      The perianal exam findings include non-thrombosed external hemorrhoids.      A localized area of thickened folds of the mucosa was found at 65 cm       proximal to the anus. Biopsies were taken with a cold forceps for       histology.      Multiple diverticula were found in the entire colon.      The exam was otherwise without abnormality.      The rectum, sigmoid colon, descending colon, transverse colon, ascending       colon and cecum appeared normal.      Non-bleeding internal hemorrhoids were found during retroflexion. Impression:            - Preparation of the colon was fair.                        - Non-thrombosed external hemorrhoids found on                         perianal exam.                        - Thickened folds of the mucosa at 65 cm proximal to  the anus. Biopsied.                        - Diverticulosis in the entire examined colon.                        - The examination was otherwise normal.                        - The rectum, sigmoid colon, descending colon,                         transverse colon, ascending colon and cecum are normal.                        - Non-bleeding internal hemorrhoids. Recommendation:        - Discharge patient to home.                        - Resume previous diet.                        - Continue present medications.                         - - Repeat colonoscopy in 2 years, with 2 day prep.                        - Return to primary care physician as previously                         scheduled.                        - The findings and recommendations were discussed with                         the patient.                        - The findings and recommendations were discussed with                         the patient's family.                        - High fiber diet.                        - Await pathology results. Procedure Code(s):     --- Professional ---                        [phone], Colonoscopy, flexible; with biopsy, single or                         multiple Diagnosis Code(s):     --- Professional ---                        K64.4, Residual hemorrhoidal skin tags  K64.8, Other hemorrhoids                        K63.89, Other specified diseases of intestine                        R19.5, Other fecal abnormalities                        K57.30, Diverticulosis of large intestine without                         perforation or abscess without bleeding CPT copyright 2019 American Medical Association. All rights reserved. The codes documented in this report are preliminary and upon coder review may  be revised to meet current compliance requirements.  Vonda Antigua, MD Margretta Sidle B. Bonna Gains MD, MD 10/01/2019 10:01:54 AM This report has been signed electronically. Number of Addenda: 0 Note Initiated On: 10/01/2019 8:45 AM Scope Withdrawal Time: 0 hours 17 minutes 48 seconds  Total Procedure Duration: 0 hours 38 minutes 20 seconds  Estimated Blood Loss:  Estimated blood loss: none.      [facility]

## 2019-10-01 NOTE — H&P (Signed)
Vonda Antigua, MD [address], [place], [place], [place], [address] [address], [place], [place], [place], [address] Phone: [phone]  Fax: [phone]  Primary Care Physician:  Valerie Roys, DO   Pre-Procedure History & Physical: HPI:  Melanie Rollins is a 74 y.o. female is here for a colonoscopy and EGD.   Past Medical History:  Diagnosis Date  . Arthritis    RA  . Closed left hip fracture ([place]) 03/10/2018  . Hip fracture requiring operative repair [facility])    left hip   . Hyperlipidemia   . Nephrolithiasis 03/14/2015  . Pulmonary fibrosis ([place])   . Reynolds syndrome [facility])     Past Surgical History:  Procedure Laterality Date  . INTRAMEDULLARY (IM) NAIL INTERTROCHANTERIC Left 03/10/2018   Procedure: INTRAMEDULLARY (IM) NAIL INTERTROCHANTRIC-LEFT TFNA;  Surgeon: Lovell Sheehan, MD;  Location: [facility] ORS;  Service: Orthopedics;  Laterality: Left;  . kidney stones    . LUNG BIOPSY    . SQUAMOUS CELL CARCINOMA EXCISION Left    shin of leg    Prior to Admission medications   Medication Sig Start Date End Date Taking? Authorizing Provider  ACETAMINOPHEN PO Take 650 mg by mouth in the morning and at bedtime.  03/18/18  Yes [provider]  Acetylcysteine (NAC) 600 MG CAPS Take 1 capsule (600 mg total) by mouth 2 (two) times daily. Patient taking differently: Take 1,200 mg by mouth daily.  03/25/18  Yes Gerlene Fee, NP  baclofen (LIORESAL) 10 MG tablet Take 1 tablet (10 mg total) by mouth at bedtime as needed for muscle spasms. 08/24/19  Yes Johnson, Megan P, DO  BIOTIN PO Take 5,000 mcg by mouth daily.  03/18/18  Yes [provider]  Calcium Citrate-Vitamin D (CALCIUM + D PO) Take 600 mg by mouth in the morning and at bedtime.    Yes [provider]  Cholecalciferol (VITAMIN D) 2000 units CAPS Take 2,000 Units by mouth daily.   Yes [provider]  CINNAMON PO Take 1,000 mg by mouth daily.   Yes [provider]    denosumab (PROLIA) 60 MG/ML SOSY injection Inject into the skin.   Yes [provider]  hydroxychloroquine (PLAQUENIL) 200 MG tablet Take 2 tablets (400 mg total) by mouth daily. 03/25/18  Yes Gerlene Fee, NP  Multiple Vitamin (MULTIVITAMIN) tablet Take 1 tablet by mouth daily.   Yes [provider]  mupirocin ointment (BACTROBAN) 2 % Place 1 application into the nose 2 (two) times daily. 08/24/19  Yes Johnson, Megan P, DO  mycophenolate (CELLCEPT) 500 MG tablet Take 2 tablets (1,000 mg total) by mouth 2 (two) times daily. 03/25/18  Yes Gerlene Fee, NP  NIFEdipine (PROCARDIA-XL/NIFEDICAL-XL) 30 MG 24 hr tablet Take 30 mg by mouth daily. 07/03/19  Yes [provider]  omeprazole (PRILOSEC) 20 MG capsule Take 1 capsule (20 mg total) by mouth daily. 08/24/19  Yes Johnson, Megan P, DO  permethrin (ELIMITE) 5 % cream permethrin 5 % topical cream   Yes [provider]  predniSONE (DELTASONE) 10 MG tablet Take 1 tablet (10 mg total) by mouth daily. 03/25/18  Yes Gerlene Fee, NP  sertraline (ZOLOFT) 100 MG tablet Take 1 tablet (100 mg total) by mouth daily. 07/21/19  Yes Johnson, Megan P, DO  sulfamethoxazole-trimethoprim (BACTRIM DS) 800-160 MG tablet Take 1 tablet (160 mg of trimethoprim total) by mouth 3 (three) timesa week 11/17/18 11/17/19 Yes [provider]  traMADol Veatrice Bourbon) 50  MG tablet Take 50 mg by mouth every 6 (six) hours as needed. 06/30/19  Yes [provider]  zolpidem (AMBIEN) 10 MG tablet Take 0.5 tablets (5 mg total) by mouth at bedtime. 02/04/19  Yes Johnson, Megan P, DO  acyclovir ointment (ZOVIRAX) 5 % Apply 1 application topically every 3 (three) hours. 08/18/18   Johnson, Megan P, DO  etodolac (LODINE) 200 MG capsule 2 (two) times daily.  01/23/19   [provider]  Omega-3 Fatty Acids (FISH OIL) 1000 MG CAPS Take 1,000 mg by mouth daily.    [provider]  OXYGEN 2 L/min by Continuous infusion (non-IV) route  daily.    [provider]    Allergies as of 09/03/2019 - Review Complete 09/03/2019  Allergen Reaction Noted  . Imuran [azathioprine] Nausea Only and Other (See Comments) 03/14/2015    Family History  Problem Relation Age of Onset  . Hypertension Mother   . Heart disease Mother        pacemaker and CHF  . Glaucoma Mother   . Hypertension Brother   . Arthritis Sister   . Heart disease Maternal Grandmother   . Heart disease Maternal Grandfather   . Hypertension Paternal Grandmother   . Stroke Paternal Grandmother   . Cancer Paternal Grandfather     Social History   Socioeconomic History  . Marital status: Married    Spouse name: Not on file  . Number of children: Not on file  . Years of education: Not on file  . Highest education level: Associate degree: academic program  Occupational History  . Occupation: part time clerical work   Tobacco Use  . Smoking status: Never Smoker  . Smokeless tobacco: Never Used  Substance and Sexual Activity  . Alcohol use: Yes    Alcohol/week: 0.0 standard drinks    Comment: pt states she drinks wine on occasion  . Drug use: No  . Sexual activity: Yes  Other Topics Concern  . Not on file  Social History Narrative   In investment club   Working part time   In lady sorority meet once a month   Stitching group meets couple times a month    Attends church    Social Determinants of Radio broadcast assistant Strain: Low Risk   . Difficulty of Paying Living Expenses: Not hard at all  Food Insecurity: No Food Insecurity  . Worried About Charity fundraiser in the Last Year: Never true  . Ran Out of Food in the Last Year: Never true  Transportation Needs: No Transportation Needs  . Lack of Transportation (Medical): No  . Lack of Transportation (Non-Medical): No  Physical Activity: Inactive  . Days of Exercise per Week: 0 days  . Minutes of Exercise per Session: 0 min  Stress: Stress Concern Present  . Feeling of Stress  : To some extent  Social Connections: Not Isolated  . Frequency of Communication with Friends and Family: More than three times a week  . Frequency of Social Gatherings with Friends and Family: More than three times a week  . Attends Religious Services: More than 4 times per year  . Active Member of Clubs or Organizations: Yes  . Attends Archivist Meetings: More than 4 times per year  . Marital Status: Married  Human resources officer Violence: Not At Risk  . Fear of Current or Ex-Partner: No  . Emotionally Abused: No  . Physically Abused: No  . Sexually Abused: No  Review of Systems: See HPI, otherwise negative ROS  Physical Exam: BP (!) 116/57   Pulse 69   Temp (!) 96.9 F (36.1 C)   Resp 19   Ht 5' 3.5" (1.613 m)   Wt 54.4 kg   LMP  (LMP Unknown)   SpO2 100%   BMI 20.92 kg/m  General:   Alert,  pleasant and cooperative in NAD Head:  Normocephalic and atraumatic. Neck:  Supple; no masses or thyromegaly. Lungs:  Clear throughout to auscultation, normal respiratory effort.    Heart:  +S1, +S2, Regular rate and rhythm, No edema. Abdomen:  Soft, nontender and nondistended. Normal bowel sounds, without guarding, and without rebound.   Neurologic:  Alert and  oriented x4;  grossly normal neurologically.  Impression/Plan: Melanie Rollins is here for a colonoscopy to be performed for positive FOBT and EGD for abdominal pain  Risks, benefits, limitations, and alternatives regarding the procedures have been reviewed with the patient.  Questions have been answered.  All parties agreeable.   Virgel Manifold, MD  10/01/2019, 8:12 AM

## 2019-10-01 NOTE — Transfer of Care (Signed)
Immediate Anesthesia Transfer of Care Note  Patient: Melanie Rollins  Procedure(s) Performed: COLONOSCOPY WITH PROPOFOL (N/A ) ESOPHAGOGASTRODUODENOSCOPY (EGD) WITH PROPOFOL (N/A )  Patient Location: PACU and Endoscopy Unit  Anesthesia Type:General  Level of Consciousness: sedated  Airway & Oxygen Therapy: Patient Spontanous Breathing and Patient connected to face mask oxygen  Post-op Assessment: Report given to RN  Post vital signs: stable  Last Vitals:  Vitals Value Taken Time  BP    Temp    Pulse    Resp    SpO2      Last Pain:  Vitals:   10/01/19 0809  PainSc: 0-No pain         Complications: No apparent anesthesia complications

## 2019-10-02 ENCOUNTER — Encounter: Payer: Self-pay | Admitting: *Deleted

## 2019-10-02 LAB — SURGICAL PATHOLOGY

## 2019-10-02 NOTE — Anesthesia Postprocedure Evaluation (Addendum)
Anesthesia Post Note  Patient: SATOMI BUDA  Procedure(s) Performed: COLONOSCOPY WITH PROPOFOL (N/A ) ESOPHAGOGASTRODUODENOSCOPY (EGD) WITH PROPOFOL (N/A )  Patient location during evaluation: Endoscopy Anesthesia Type: General Level of consciousness: awake and alert Pain management: pain level controlled Vital Signs Assessment: post-procedure vital signs reviewed and stable Respiratory status: spontaneous breathing, nonlabored ventilation, respiratory function stable and patient connected to nasal cannula oxygen Cardiovascular status: blood pressure returned to baseline and stable Postop Assessment: no apparent nausea or vomiting Anesthetic complications: no     Last Vitals:  Vitals:   10/01/19 1020 10/01/19 1030  BP: 123/61 116/64  Pulse:  75  Resp: (!) 21 15  Temp:    SpO2: 100% 93%    Last Pain:  Vitals:   10/02/19 0746  PainSc: 0-No pain                 Martha Clan

## 2019-10-05 ENCOUNTER — Encounter: Payer: Self-pay | Admitting: Gastroenterology

## 2019-10-15 ENCOUNTER — Encounter: Payer: Self-pay | Admitting: Gastroenterology

## 2019-10-15 ENCOUNTER — Ambulatory Visit (INDEPENDENT_AMBULATORY_CARE_PROVIDER_SITE_OTHER): Payer: Medicare Other | Admitting: Gastroenterology

## 2019-10-15 VITALS — BP 117/56 | HR 78 | Temp 97.4°F | Wt 124.8 lb

## 2019-10-15 DIAGNOSIS — K209 Esophagitis, unspecified without bleeding: Secondary | ICD-10-CM | POA: Diagnosis not present

## 2019-10-15 NOTE — Progress Notes (Signed)
Vonda Antigua, MD [address]  [place]  [place], [place] [address]  Main: [phone]  Fax: [phone]   Primary Care Physician: Valerie Roys, DO   Chief Complaint  Patient presents with  . Abdominal discomfort    Patient stated that she ahd been doing well with no complaints.    HPI: Melanie Rollins is a 74 y.o. female here for follow-up of abdominal pain.  States this has completely resolved.  Underwent EGD and colonoscopy recently.  EGD showed grade a esophagitis.  Has been on omeprazole chronically due to heartburn and without this has heartburn symptoms otherwise.  Colonoscopy was done for positive FOBT.  Sees Dr. Janese Banks for normocytic anemia  Current Outpatient Medications  Medication Sig Dispense Refill  . ACETAMINOPHEN PO Take 650 mg by mouth in the morning and at bedtime.     . Acetylcysteine (NAC) 600 MG CAPS Take 1 capsule (600 mg total) by mouth 2 (two) times daily. (Patient taking differently: Take 1,200 mg by mouth daily. ) 60 capsule 0  . acyclovir ointment (ZOVIRAX) 5 % Apply 1 application topically every 3 (three) hours. 30 g 1  . baclofen (LIORESAL) 10 MG tablet Take 1 tablet (10 mg total) by mouth at bedtime as needed for muscle spasms. 30 each 0  . BIOTIN PO Take 5,000 mcg by mouth daily.     . Calcium Citrate-Vitamin D (CALCIUM + D PO) Take 600 mg by mouth in the morning and at bedtime.     . Cholecalciferol (VITAMIN D) 2000 units CAPS Take 2,000 Units by mouth daily.    Marland Kitchen CINNAMON PO Take 1,000 mg by mouth daily.    Marland Kitchen denosumab (PROLIA) 60 MG/ML SOSY injection Inject into the skin.    . hydroxychloroquine (PLAQUENIL) 200 MG tablet Take 2 tablets (400 mg total) by mouth daily. (Patient taking differently: Take 200 mg by mouth daily. ) 60 tablet 0  . Multiple Vitamin (MULTIVITAMIN) tablet Take 1 tablet by mouth daily.    . mupirocin ointment (BACTROBAN) 2 % Place 1 application into the nose 2 (two) times daily. 22 g 3  . mycophenolate  (CELLCEPT) 500 MG tablet Take 2 tablets (1,000 mg total) by mouth 2 (two) times daily. 120 tablet 0  . NIFEdipine (PROCARDIA-XL/NIFEDICAL-XL) 30 MG 24 hr tablet Take 30 mg by mouth daily.    . Omega-3 Fatty Acids (FISH OIL) 1000 MG CAPS Take 1,000 mg by mouth daily.    Marland Kitchen omeprazole (PRILOSEC) 20 MG capsule Take 1 capsule (20 mg total) by mouth daily. 30 capsule 0  . OXYGEN 2 L/min by Continuous infusion (non-IV) route daily.    . permethrin (ELIMITE) 5 % cream permethrin 5 % topical cream    . predniSONE (DELTASONE) 10 MG tablet Take 1 tablet (10 mg total) by mouth daily. 30 tablet 0  . sertraline (ZOLOFT) 100 MG tablet Take 1 tablet (100 mg total) by mouth daily. 90 tablet 1  . traMADol (ULTRAM) 50 MG tablet Take 50 mg by mouth every 6 (six) hours as needed.    . zolpidem (AMBIEN) 10 MG tablet Take 0.5 tablets (5 mg total) by mouth at bedtime. 30 tablet 1   No current facility-administered medications for this visit.    Allergies as of 10/15/2019 - Review Complete 10/01/2019  Allergen Reaction Noted  . Imuran [azathioprine] Nausea Only and Other (See Comments) 03/14/2015    ROS:  General: Negative for anorexia, weight loss, fever, chills, fatigue, weakness. ENT: Negative  for hoarseness, difficulty swallowing , nasal congestion. CV: Negative for chest pain, angina, palpitations, dyspnea on exertion, peripheral edema.  Respiratory: Negative for dyspnea at rest, dyspnea on exertion, cough, sputum, wheezing.  GI: See history of present illness. GU:  Negative for dysuria, hematuria, urinary incontinence, urinary frequency, nocturnal urination.  Endo: Negative for unusual weight change.    Physical Examination:   BP (!) 117/56   Pulse 78   Temp (!) 97.4 F (36.3 C) (Oral)   Wt 124 lb 12.8 oz (56.6 kg)   LMP  (LMP Unknown)   BMI 21.76 kg/m   General: Well-nourished, well-developed in no acute distress.  Eyes: No icterus. Conjunctivae pink. Mouth: Oropharyngeal mucosa moist and  pink , no lesions erythema or exudate. Neck: Supple, Trachea midline Abdomen: Bowel sounds are normal, nontender, nondistended, no hepatosplenomegaly or masses, no abdominal bruits or hernia , no rebound or guarding.   Extremities: No lower extremity edema. No clubbing or deformities. Neuro: Alert and oriented x 3.  Grossly intact. Skin: Warm and dry, no jaundice.   Psych: Alert and cooperative, normal mood and affect.   Labs: CMP     Component Value Date/Time   NA 137 09/10/2019 1409   NA 143 08/24/2019 0934   K 3.9 09/10/2019 1409   CL 99 09/10/2019 1409   CO2 26 09/10/2019 1409   GLUCOSE 121 (H) 09/10/2019 1409   BUN 14 09/10/2019 1409   BUN 11 08/24/2019 0934   CREATININE 0.86 09/10/2019 1409   CALCIUM 9.8 09/10/2019 1409   PROT 8.0 09/10/2019 1409   PROT 6.6 08/24/2019 0934   ALBUMIN 4.5 09/10/2019 1409   ALBUMIN 4.4 08/24/2019 0934   AST 31 09/10/2019 1409   ALT 21 09/10/2019 1409   ALKPHOS 58 09/10/2019 1409   BILITOT 0.5 09/10/2019 1409   BILITOT <0.2 08/24/2019 0934   GFRNONAA >60 09/10/2019 1409   GFRAA >60 09/10/2019 1409   Lab Results  Component Value Date   WBC 10.9 (H) 09/10/2019   HGB 12.7 09/10/2019   HCT 39.7 09/10/2019   MCV 94.5 09/10/2019   PLT 317 09/10/2019    Imaging Studies: No results found.  Assessment and Plan:   Melanie Rollins is a 74 y.o. y/o female here for follow-up of abdominal pain  Abdominal pain has completely resolved and was likely due to the esophagitis/GERD  Patient would like to continue on low-dose PPI as she has been on this chronically and without this has significant heartburn  Patient educated extensively on acid reflux lifestyle modification, including buying a bed wedge, not eating 3 hrs before bedtime, diet modifications, and handout given for the same.   (Risks of PPI use were discussed with patient including bone loss, C. Diff diarrhea, pneumonia, infections, CKD, electrolyte abnormalities.  Pt. Verbalizes  understanding and chooses to continue the medication.)  Continue follow-up with hematology for normocytic anemia    Dr Vonda Antigua

## 2019-10-21 ENCOUNTER — Ambulatory Visit: Admit: 2019-10-21 | Payer: No Typology Code available for payment source | Admitting: Internal Medicine

## 2019-10-21 SURGERY — ESOPHAGOGASTRODUODENOSCOPY (EGD) WITH PROPOFOL
Anesthesia: General

## 2019-11-02 ENCOUNTER — Ambulatory Visit
Admission: RE | Admit: 2019-11-02 | Discharge: 2019-11-02 | Disposition: A | Discharge status: Home / Self Care | Payer: Medicare Other | Source: Ambulatory Visit | Attending: Family Medicine | Admitting: Family Medicine

## 2019-11-02 ENCOUNTER — Other Ambulatory Visit: Payer: Self-pay

## 2019-11-02 DIAGNOSIS — Z1231 Encounter for screening mammogram for malignant neoplasm of breast: Secondary | ICD-10-CM | POA: Diagnosis not present

## 2019-11-02 HISTORY — DX: Malignant (primary) neoplasm, unspecified: C80.1

## 2019-11-20 ENCOUNTER — Ambulatory Visit (INDEPENDENT_AMBULATORY_CARE_PROVIDER_SITE_OTHER): Payer: Medicare Other | Admitting: General Practice

## 2019-11-20 ENCOUNTER — Encounter: Payer: Self-pay | Admitting: General Practice

## 2019-11-20 DIAGNOSIS — D500 Iron deficiency anemia secondary to blood loss (chronic): Secondary | ICD-10-CM

## 2019-11-20 DIAGNOSIS — M1990 Unspecified osteoarthritis, unspecified site: Secondary | ICD-10-CM

## 2019-11-20 DIAGNOSIS — F32A Depression, unspecified: Secondary | ICD-10-CM

## 2019-11-20 DIAGNOSIS — E782 Mixed hyperlipidemia: Secondary | ICD-10-CM

## 2019-11-20 DIAGNOSIS — F419 Anxiety disorder, unspecified: Secondary | ICD-10-CM | POA: Diagnosis not present

## 2019-11-20 DIAGNOSIS — F329 Major depressive disorder, single episode, unspecified: Secondary | ICD-10-CM

## 2019-11-20 DIAGNOSIS — I2510 Atherosclerotic heart disease of native coronary artery without angina pectoris: Secondary | ICD-10-CM | POA: Diagnosis not present

## 2019-11-20 DIAGNOSIS — J8410 Pulmonary fibrosis, unspecified: Secondary | ICD-10-CM

## 2019-11-20 NOTE — Chronic Care Management (AMB) (Signed)
Chronic Care Management   Follow Up Note   11/20/2019 Name: Melanie Rollins MRN: [id number] DOB: [birth year]  Referred by: Valerie Roys, DO Reason for referral : Chronic Care Management (RNCM Follow up: Chronic Disease Management and Care Coordination needs)   Melanie Rollins is a 74 y.o. year old female who is a primary care patient of Valerie Roys, DO. The CCM team was consulted for assistance with chronic disease management and care coordination needs.    Review of patient status, including review of consultants reports, relevant laboratory and other test results, and collaboration with appropriate care team members and the patient's provider was performed as part of comprehensive patient evaluation and provision of chronic care management services.    SDOH (Social Determinants of Health) assessments performed: Yes See Care Plan activities for detailed interventions related to [facility])     Outpatient Encounter Medications as of 11/20/2019  Medication Sig Note  . ACETAMINOPHEN PO Take 650 mg by mouth in the morning and at bedtime.    . Acetylcysteine (NAC) 600 MG CAPS Take 1 capsule (600 mg total) by mouth 2 (two) times daily. (Patient taking differently: Take 1,200 mg by mouth daily. )   . acyclovir ointment (ZOVIRAX) 5 % Apply 1 application topically every 3 (three) hours. 09/01/2019: Using PRN cold sores   . baclofen (LIORESAL) 10 MG tablet Take 1 tablet (10 mg total) by mouth at bedtime as needed for muscle spasms. 09/01/2019: Using ~couple times weekly  . BIOTIN PO Take 5,000 mcg by mouth daily.    . Calcium Citrate-Vitamin D (CALCIUM + D PO) Take 600 mg by mouth in the morning and at bedtime.    . Cholecalciferol (VITAMIN D) 2000 units CAPS Take 2,000 Units by mouth daily.   Marland Kitchen CINNAMON PO Take 1,000 mg by mouth daily.   Marland Kitchen denosumab (PROLIA) 60 MG/ML SOSY injection Inject into the skin.   . hydroxychloroquine (PLAQUENIL) 200 MG tablet Take 2 tablets (400 mg total) by mouth daily.  (Patient taking differently: Take 200 mg by mouth daily. )   . Multiple Vitamin (MULTIVITAMIN) tablet Take 1 tablet by mouth daily.   . mupirocin ointment (BACTROBAN) 2 % Place 1 application into the nose 2 (two) times daily.   . mycophenolate (CELLCEPT) 500 MG tablet Take 2 tablets (1,000 mg total) by mouth 2 (two) times daily.   Marland Kitchen NIFEdipine (PROCARDIA-XL/NIFEDICAL-XL) 30 MG 24 hr tablet Take 30 mg by mouth daily.   . Omega-3 Fatty Acids (FISH OIL) 1000 MG CAPS Take 1,000 mg by mouth daily.   Marland Kitchen omeprazole (PRILOSEC) 20 MG capsule Take 1 capsule (20 mg total) by mouth daily.   . OXYGEN 2 L/min by Continuous infusion (non-IV) route daily.   . permethrin (ELIMITE) 5 % cream permethrin 5 % topical cream 09/01/2019: For rosacea  . predniSONE (DELTASONE) 10 MG tablet Take 1 tablet (10 mg total) by mouth daily.   . sertraline (ZOLOFT) 100 MG tablet Take 1 tablet (100 mg total) by mouth daily.   . traMADol (ULTRAM) 50 MG tablet Take 50 mg by mouth every 6 (six) hours as needed. 09/01/2019: ~couple times weekly  . zolpidem (AMBIEN) 10 MG tablet Take 0.5 tablets (5 mg total) by mouth at bedtime.    No facility-administered encounter medications on file as of 11/20/2019.     Objective:  BP Readings from Last 3 Encounters:  10/15/19 (!) 117/56  10/01/19 116/64  09/10/19 108/76    Goals Addressed  This Visit's Progress   .  RNCM: I am a little stressed out about my upcoming procedure        CARE PLAN ENTRY (see longtitudinal plan of care for additional care plan information)  Current Barriers:  . Chronic Disease Management support, education, and care coordination needs related to CAD, HLD, Anxiety, Depression, Pulmonary Disease, and Chronic hip pain  Clinical Goal(s) related to CAD, HLD, Anxiety, Depression, Pulmonary Disease, and Chronic hip pain :  Over the next 120 days, patient will:  . Work with the care management team to address educational, disease management, and care  coordination needs  . Begin or continue self health monitoring activities as directed today  continue to monitor diet, use sodium as instructed and watch fat content . Call provider office for new or worsened signs and symptoms Oxygen saturation lower than established parameter, Shortness of breath, and New or worsened symptom related to Chronic hip pain and other chronic conditions . Call care management team with questions or concerns . Verbalize basic understanding of patient centered plan of care established today  Interventions related to CAD, HLD, Anxiety, Depression, Pulmonary Disease, and Chronic Hip Pain :  . Evaluation of current treatment plans and patient's adherence to plan as established by provider . Assessed patient understanding of disease states.  The patient has a good understanding of her chronic conditions. Says her anxiety is better since there is a plan in place to find out about her anemia.  The patient is doing well and has had her procedure. Nothing was found to cause the anemia and her counts have gone back up. She just celebrated her birthday and is happy and doing well today.  . Assessed patient's education and care coordination needs. Denies any needs at this time. Knows there are resources available  . Provided disease specific education to patient. The patient endorses compliance with heart health diet. No issues at this time with dietary compliance  . Collaborated with appropriate clinical care team members regarding patient needs . Pharmacy referral for poly pharmacy, if she should take Magnesium and if any of her medications cause blurred vision- completed   Patient Self Care Activities related to CAD, HLD, Anxiety, Depression, Pulmonary Disease, and Chronic Hip Pain :  . Patient is unable to independently self-manage chronic health conditions  Please see past updates related to this goal by clicking on the "Past Updates" button in the selected goal      .  RNCM:  Pt -"Hopefully soon they will find out the reason for my anemia" (pt-stated)        CARE PLAN ENTRY (see longitudinal plan of care for additional care plan information)  Current Barriers:  Marland Kitchen Knowledge Deficits related to upcoming procedures EGD and colonoscopy in hopes of finding out the source of her anemia . Chronic Disease Management support and education needs related to chronic anemia  Nurse Case Manager Clinical Goal(s):  Marland Kitchen Over the next 90 days, patient will verbalize understanding of plan for completion of colonoscopy and EGD in hopes of finding the source of her anemia . Over the next 90 days, patient will work with [place], GI specialist, and pcp to address needs related to chronic anemia and etiology  . Over the next 90 days, patient will attend all scheduled medical appointments: upcoming EGD and colonoscopy for June 2021  Interventions:  . Inter-disciplinary care team collaboration (see longitudinal plan of care) . Evaluation of current treatment plan related to chronic anemia and patient's adherence  to plan as established by provider. The patient had her colonoscopy in May and no source of bleeding was found. She says her levels have gone back up and maybe it was just a fluke.  . Advised patient to call RNCM for any education needs or needs before next outreach . Provided education to patient re: colonoscopy prep- completed . Discussed plans with patient for ongoing care management follow up and provided patient with direct contact information for care management team . Reviewed scheduled/upcoming provider appointments including: June 2021 patient will have a colonoscopy and EGD.  Next pcp appointment 02-23-2020  Patient Self Care Activities:  . Patient verbalizes understanding of plan to completed additional scheduled testing in hopes of finding the etiology of her chronic anemia . Attends all scheduled provider appointments . Calls provider office for new concerns or  questions . Unable to independently manage chronic anemia   Please see past updates related to this goal by clicking on the "Past Updates" button in the selected goal          Plan:   The care management team will reach out to the patient again over the next 60 to 90 days.    Noreene Larsson RN, MSN, [facility] Mobile: [phone]

## 2019-11-20 NOTE — Progress Notes (Signed)
  This encounter was created in error - please disregard.

## 2019-11-20 NOTE — Patient Instructions (Signed)
Visit Information  Goals Addressed              This Visit's Progress   .  RNCM: I am a little stressed out about my upcoming procedure        CARE PLAN ENTRY (see longtitudinal plan of care for additional care plan information)  Current Barriers:  . Chronic Disease Management support, education, and care coordination needs related to CAD, HLD, Anxiety, Depression, Pulmonary Disease, and Chronic hip pain  Clinical Goal(s) related to CAD, HLD, Anxiety, Depression, Pulmonary Disease, and Chronic hip pain :  Over the next 120 days, patient will:  . Work with the care management team to address educational, disease management, and care coordination needs  . Begin or continue self health monitoring activities as directed today  continue to monitor diet, use sodium as instructed and watch fat content . Call provider office for new or worsened signs and symptoms Oxygen saturation lower than established parameter, Shortness of breath, and New or worsened symptom related to Chronic hip pain and other chronic conditions . Call care management team with questions or concerns . Verbalize basic understanding of patient centered plan of care established today  Interventions related to CAD, HLD, Anxiety, Depression, Pulmonary Disease, and Chronic Hip Pain :  . Evaluation of current treatment plans and patient's adherence to plan as established by provider . Assessed patient understanding of disease states.  The patient has a good understanding of her chronic conditions. Says her anxiety is better since there is a plan in place to find out about her anemia.  The patient is doing well and has had her procedure. Nothing was found to cause the anemia and her counts have gone back up. She just celebrated her birthday and is happy and doing well today.  . Assessed patient's education and care coordination needs. Denies any needs at this time. Knows there are resources available  . Provided disease specific  education to patient. The patient endorses compliance with heart health diet. No issues at this time with dietary compliance  . Collaborated with appropriate clinical care team members regarding patient needs . Pharmacy referral for poly pharmacy, if she should take Magnesium and if any of her medications cause blurred vision- completed   Patient Self Care Activities related to CAD, HLD, Anxiety, Depression, Pulmonary Disease, and Chronic Hip Pain :  . Patient is unable to independently self-manage chronic health conditions  Please see past updates related to this goal by clicking on the "Past Updates" button in the selected goal      .  RNCM: Pt -"Hopefully soon they will find out the reason for my anemia" (pt-stated)        CARE PLAN ENTRY (see longitudinal plan of care for additional care plan information)  Current Barriers:  Marland Kitchen Knowledge Deficits related to upcoming procedures EGD and colonoscopy in hopes of finding out the source of her anemia . Chronic Disease Management support and education needs related to chronic anemia  Nurse Case Manager Clinical Goal(s):  Marland Kitchen Over the next 90 days, patient will verbalize understanding of plan for completion of colonoscopy and EGD in hopes of finding the source of her anemia . Over the next 90 days, patient will work with [place], GI specialist, and pcp to address needs related to chronic anemia and etiology  . Over the next 90 days, patient will attend all scheduled medical appointments: upcoming EGD and colonoscopy for June 2021  Interventions:  . Inter-disciplinary care team collaboration (see  longitudinal plan of care) . Evaluation of current treatment plan related to chronic anemia and patient's adherence to plan as established by provider. The patient had her colonoscopy in May and no source of bleeding was found. She says her levels have gone back up and maybe it was just a fluke.  . Advised patient to call RNCM for any education needs or needs  before next outreach . Provided education to patient re: colonoscopy prep- completed . Discussed plans with patient for ongoing care management follow up and provided patient with direct contact information for care management team . Reviewed scheduled/upcoming provider appointments including: June 2021 patient will have a colonoscopy and EGD.  Next pcp appointment 02-23-2020  Patient Self Care Activities:  . Patient verbalizes understanding of plan to completed additional scheduled testing in hopes of finding the etiology of her chronic anemia . Attends all scheduled provider appointments . Calls provider office for new concerns or questions . Unable to independently manage chronic anemia   Please see past updates related to this goal by clicking on the "Past Updates" button in the selected goal         Patient verbalizes understanding of instructions provided today.   The care management team will reach out to the patient again over the next 60 to 90 days.   Noreene Larsson RN, MSN, [facility] Mobile: [phone]

## 2019-11-24 ENCOUNTER — Ambulatory Visit: Payer: Medicare Other | Admitting: Pharmacist

## 2019-11-24 DIAGNOSIS — J8410 Pulmonary fibrosis, unspecified: Secondary | ICD-10-CM

## 2019-11-24 DIAGNOSIS — F419 Anxiety disorder, unspecified: Secondary | ICD-10-CM

## 2019-11-24 DIAGNOSIS — M1990 Unspecified osteoarthritis, unspecified site: Secondary | ICD-10-CM

## 2019-11-24 NOTE — Chronic Care Management (AMB) (Signed)
Chronic Care Management   Follow Up Note   11/24/2019 Name: Melanie Rollins MRN: [id number] DOB: 1945-09-26  Referred by: Valerie Roys, DO Reason for referral : Chronic Care Management (Medication Management)   VALARIE FARACE is a 74 y.o. year old female who is a primary care patient of Valerie Roys, DO. The CCM team was consulted for assistance with chronic disease management and care coordination needs.    Contacted patient for medication management review.   Review of patient status, including review of consultants reports, relevant laboratory and other test results, and collaboration with appropriate care team members and the patient's provider was performed as part of comprehensive patient evaluation and provision of chronic care management services.    SDOH (Social Determinants of Health) assessments performed: No See Care Plan activities for detailed interventions related to [facility])     Outpatient Encounter Medications as of 11/24/2019  Medication Sig Note  . ACETAMINOPHEN PO Take 650 mg by mouth in the morning and at bedtime.    . Acetylcysteine (NAC) 600 MG CAPS Take 1 capsule (600 mg total) by mouth 2 (two) times daily. (Patient taking differently: Take 1,200 mg by mouth daily. )   . acyclovir ointment (ZOVIRAX) 5 % Apply 1 application topically every 3 (three) hours. 09/01/2019: Using PRN cold sores   . baclofen (LIORESAL) 10 MG tablet Take 1 tablet (10 mg total) by mouth at bedtime as needed for muscle spasms. 09/01/2019: Using ~couple times weekly  . BIOTIN PO Take 5,000 mcg by mouth daily.    . Calcium Citrate-Vitamin D (CALCIUM + D PO) Take 600 mg by mouth in the morning and at bedtime.    . Cholecalciferol (VITAMIN D) 2000 units CAPS Take 2,000 Units by mouth daily.   Marland Kitchen CINNAMON PO Take 1,000 mg by mouth daily.   Marland Kitchen denosumab (PROLIA) 60 MG/ML SOSY injection Inject into the skin.   . hydroxychloroquine (PLAQUENIL) 200 MG tablet Take 2 tablets (400 mg total) by mouth  daily. (Patient taking differently: Take 200 mg by mouth daily. )   . Multiple Vitamin (MULTIVITAMIN) tablet Take 1 tablet by mouth daily.   . mupirocin ointment (BACTROBAN) 2 % Place 1 application into the nose 2 (two) times daily.   . mycophenolate (CELLCEPT) 500 MG tablet Take 2 tablets (1,000 mg total) by mouth 2 (two) times daily.   Marland Kitchen NIFEdipine (PROCARDIA-XL/NIFEDICAL-XL) 30 MG 24 hr tablet Take 30 mg by mouth daily.   . Omega-3 Fatty Acids (FISH OIL) 1000 MG CAPS Take 1,000 mg by mouth daily.   Marland Kitchen omeprazole (PRILOSEC) 20 MG capsule Take 1 capsule (20 mg total) by mouth daily.   . OXYGEN 2 L/min by Continuous infusion (non-IV) route daily.   . permethrin (ELIMITE) 5 % cream permethrin 5 % topical cream 09/01/2019: For rosacea  . predniSONE (DELTASONE) 10 MG tablet Take 1 tablet (10 mg total) by mouth daily.   . sertraline (ZOLOFT) 100 MG tablet Take 1 tablet (100 mg total) by mouth daily.   Marland Kitchen sulfamethoxazole-trimethoprim (BACTRIM DS) 800-160 MG tablet Take 1 tablet by mouth 3 (three) times a week. 11/24/2019: PCP ppx  . traMADol (ULTRAM) 50 MG tablet Take 50 mg by mouth every 6 (six) hours as needed. 09/01/2019: ~couple times weekly  . zolpidem (AMBIEN) 10 MG tablet Take 0.5 tablets (5 mg total) by mouth at bedtime.    No facility-administered encounter medications on file as of 11/24/2019.     Objective:   Goals Addressed  This Visit's Progress     Patient Stated   .  PharmD "I am on a lot of medications" (pt-stated)        CARE PLAN ENTRY (see longtitudinal plan of care for additional care plan information)  Current Barriers:  . Polypharmacy; complex patient with multiple comorbidities including connective tissue disorder, pulmonary fibrosis, Raynaud's disease, CAD o Has a question today about Mycophenolate absorption and separation . Most recent eGFR: ~ 77 mL/min o Pulmonary Fibrosis: follows w/ [facility]; NAC 1200 mg QAM, HCQ 400 mg daily,  mycophenoate 100 mg BID, prednisone 10 mg daily. O2 daily. PCP prevention: sulfamethoxazole/TMP 800/160 mg TIW o Osteoporosis: s/p fall, fracture in 2019. Prolia Q6M. Calcium 1200 mg daily (600 mg BID) and Vitamin D 2000 units daily o Chronic pain/arthritis: acetaminophen 1300 mg BID (previously taking TID prior to receiving joint injection); tramadol 50 mg PRN, baclofen 10 mg PRN o HTN/Raynaud's: nifedipine 30 mg daily o GERD: omeprazole 20 mg daily; follows w/ Dr. Tyrone Nine. Colonoscopy did not reveal a cause for anemia.  o Depression/anxiety: sertraline 100 mg daily o Insomnia: zolpidem 5 mg PRN, ~2-3 times weekly  o Additional supplements: MVI, biotin 5000 mcg daily, omega 3 fatty acids 1000 mg daily, cinnamon daily   Pharmacist Clinical Goal(s):  Marland Kitchen Over the next 90 days, patient will work with PharmD and provider towards optimized medication management  Interventions: . Comprehensive medication review performed; medication list updated in electronic medical record . Inter-disciplinary care team collaboration (see longitudinal plan of care) . Discussed appropriate separation of mycophenoate from food. Reviewed that the most important thing is consistency. Patient verbalized understanding.   Patient Self Care Activities:  . Patient will take medications as prescribed  Please see past updates related to this goal by clicking on the "Past Updates" button in the selected goal          Plan:  - Patient denies any other questions or concerns at this time. She has our contact information for future pharmacy needs.   Catie Darnelle Maffucci, PharmD, [place] [phone]

## 2019-11-24 NOTE — Patient Instructions (Signed)
Visit Information  Goals Addressed              This Visit's Progress     Patient Stated   .  PharmD "I am on a lot of medications" (pt-stated)        CARE PLAN ENTRY (see longtitudinal plan of care for additional care plan information)  Current Barriers:  . Polypharmacy; complex patient with multiple comorbidities including connective tissue disorder, pulmonary fibrosis, Raynaud's disease, CAD o Has a question today about Mycophenolate absorption and separation . Most recent eGFR: ~ 77 mL/min o Pulmonary Fibrosis: follows w/ [facility]; NAC 1200 mg QAM, HCQ 400 mg daily, mycophenoate 100 mg BID, prednisone 10 mg daily. O2 daily. PCP prevention: sulfamethoxazole/TMP 800/160 mg TIW o Osteoporosis: s/p fall, fracture in 2019. Prolia Q6M. Calcium 1200 mg daily (600 mg BID) and Vitamin D 2000 units daily o Chronic pain/arthritis: acetaminophen 1300 mg BID (previously taking TID prior to receiving joint injection); tramadol 50 mg PRN, baclofen 10 mg PRN o HTN/Raynaud's: nifedipine 30 mg daily o GERD: omeprazole 20 mg daily; follows w/ Dr. Tyrone Nine. Colonoscopy did not reveal a cause for anemia.  o Depression/anxiety: sertraline 100 mg daily o Insomnia: zolpidem 5 mg PRN, ~2-3 times weekly  o Additional supplements: MVI, biotin 5000 mcg daily, omega 3 fatty acids 1000 mg daily, cinnamon daily   Pharmacist Clinical Goal(s):  Marland Kitchen Over the next 90 days, patient will work with PharmD and provider towards optimized medication management  Interventions: . Comprehensive medication review performed; medication list updated in electronic medical record . Inter-disciplinary care team collaboration (see longitudinal plan of care) . Discussed appropriate separation of mycophenoate from food. Reviewed that the most important thing is consistency. Patient verbalized understanding.   Patient Self Care Activities:  . Patient will take medications as prescribed  Please see past  updates related to this goal by clicking on the "Past Updates" button in the selected goal         The patient verbalized understanding of instructions provided today and declined a print copy of patient instruction materials.  Plan:  - Patient denies any other questions or concerns at this time. She has our contact information for future pharmacy needs.   Catie Darnelle Maffucci, PharmD, [place] [phone]

## 2019-12-11 ENCOUNTER — Other Ambulatory Visit: Payer: Self-pay | Admitting: Family Medicine

## 2019-12-11 NOTE — Telephone Encounter (Signed)
Requested medication (s) are due for refill today:   Provider to decide  Requested medication (s) are on the active medication list:   Yes  Future visit scheduled:   Yes in 2 mo. With Dr. Wynetta Emery   Last ordered: Non delegated refill   Requested Prescriptions  Pending Prescriptions Disp Refills   zolpidem (AMBIEN) 10 MG tablet [Pharmacy Med Name: ZOLPIDEM TARTRATE 10 MG TABLET] 30 tablet 0    Sig: Take 0.5 tablets (5 mg total) by mouth at bedtime.      Not Delegated - Psychiatry:  Anxiolytics/Hypnotics Failed - 12/11/2019 12:11 PM      Failed - This refill cannot be delegated      Failed - Urine Drug Screen completed in last 360 days.      Passed - Valid encounter within last 6 months    Recent Outpatient Visits           3 months ago Coronary artery disease involving native coronary artery of native heart without angina pectoris   [facility] [place], Megan P, DO   4 months ago [place], Megan P, DO   10 months ago Age-related osteoporosis with current pathological fracture, sequela   [facility] [place], Megan P, DO   1 year ago Pulmonary fibrosis [facility])   [place], [place], DO   1 year ago Anxiety and depression   [facility] [place], Barb Merino, DO       Future Appointments             In 2 months Johnson, Barb Merino, DO [employer], [place]   In 8 months  [employer], [place]

## 2019-12-13 NOTE — Telephone Encounter (Signed)
Needs appt sooner if needs [place]

## 2019-12-14 DIAGNOSIS — Z859 Personal history of malignant neoplasm, unspecified: Secondary | ICD-10-CM | POA: Diagnosis not present

## 2019-12-14 DIAGNOSIS — Z872 Personal history of diseases of the skin and subcutaneous tissue: Secondary | ICD-10-CM | POA: Diagnosis not present

## 2019-12-14 DIAGNOSIS — L0109 Other impetigo: Secondary | ICD-10-CM | POA: Diagnosis not present

## 2019-12-14 DIAGNOSIS — L578 Other skin changes due to chronic exposure to nonionizing radiation: Secondary | ICD-10-CM | POA: Diagnosis not present

## 2019-12-14 NOTE — Telephone Encounter (Signed)
Pt stated that she is not out and she was just requesting all refills

## 2019-12-29 DIAGNOSIS — J9611 Chronic respiratory failure with hypoxia: Secondary | ICD-10-CM | POA: Diagnosis not present

## 2019-12-29 DIAGNOSIS — R21 Rash and other nonspecific skin eruption: Secondary | ICD-10-CM | POA: Diagnosis not present

## 2019-12-29 DIAGNOSIS — R5383 Other fatigue: Secondary | ICD-10-CM | POA: Diagnosis not present

## 2019-12-29 DIAGNOSIS — R6889 Other general symptoms and signs: Secondary | ICD-10-CM | POA: Diagnosis not present

## 2019-12-29 DIAGNOSIS — J8410 Pulmonary fibrosis, unspecified: Secondary | ICD-10-CM | POA: Diagnosis not present

## 2019-12-29 DIAGNOSIS — M368 Systemic disorders of connective tissue in other diseases classified elsewhere: Secondary | ICD-10-CM | POA: Diagnosis not present

## 2019-12-29 DIAGNOSIS — Z79899 Other long term (current) drug therapy: Secondary | ICD-10-CM | POA: Diagnosis not present

## 2019-12-29 DIAGNOSIS — I7300 Raynaud's syndrome without gangrene: Secondary | ICD-10-CM | POA: Diagnosis not present

## 2019-12-29 DIAGNOSIS — M791 Myalgia, unspecified site: Secondary | ICD-10-CM | POA: Diagnosis not present

## 2019-12-29 DIAGNOSIS — J849 Interstitial pulmonary disease, unspecified: Secondary | ICD-10-CM | POA: Diagnosis not present

## 2019-12-29 DIAGNOSIS — R110 Nausea: Secondary | ICD-10-CM | POA: Diagnosis not present

## 2019-12-29 DIAGNOSIS — J984 Other disorders of lung: Secondary | ICD-10-CM | POA: Diagnosis not present

## 2020-01-15 ENCOUNTER — Other Ambulatory Visit: Payer: Self-pay | Admitting: Family Medicine

## 2020-01-15 DIAGNOSIS — F419 Anxiety disorder, unspecified: Secondary | ICD-10-CM

## 2020-01-15 NOTE — Telephone Encounter (Signed)
Requested Prescriptions  Pending Prescriptions Disp Refills   sertraline (ZOLOFT) 100 MG tablet [Pharmacy Med Name: SERTRALINE HCL 100 MG TABLET] 90 tablet 0    Sig: Take 1 tablet (100 mg total) by mouth daily.     Psychiatry:  Antidepressants - SSRI Passed - 01/15/2020  5:55 PM      Passed - Completed PHQ-2 or PHQ-9 in the last 360 days.      Passed - Valid encounter within last 6 months    Recent Outpatient Visits          4 months ago Coronary artery disease involving native coronary artery of native heart without angina pectoris   [facility] [place], Megan P, DO   5 months ago [place], Megan P, DO   11 months ago Age-related osteoporosis with current pathological fracture, sequela   [facility] [place], Megan P, DO   1 year ago Pulmonary fibrosis [facility])   [place], [place], DO   2 years ago Anxiety and depression   [facility] [place], [place], DO      Future Appointments            In 1 month Johnson, Barb Merino, DO [employer], [place]   In 7 months  [employer], [place]

## 2020-01-17 DIAGNOSIS — Z23 Encounter for immunization: Secondary | ICD-10-CM | POA: Diagnosis not present

## 2020-01-21 DIAGNOSIS — J849 Interstitial pulmonary disease, unspecified: Secondary | ICD-10-CM | POA: Diagnosis not present

## 2020-01-21 DIAGNOSIS — D649 Anemia, unspecified: Secondary | ICD-10-CM | POA: Diagnosis not present

## 2020-01-21 DIAGNOSIS — I7300 Raynaud's syndrome without gangrene: Secondary | ICD-10-CM | POA: Diagnosis not present

## 2020-01-21 DIAGNOSIS — M810 Age-related osteoporosis without current pathological fracture: Secondary | ICD-10-CM | POA: Diagnosis not present

## 2020-01-21 DIAGNOSIS — M359 Systemic involvement of connective tissue, unspecified: Secondary | ICD-10-CM | POA: Diagnosis not present

## 2020-01-26 DIAGNOSIS — Z79899 Other long term (current) drug therapy: Secondary | ICD-10-CM | POA: Diagnosis not present

## 2020-01-27 ENCOUNTER — Encounter: Payer: Self-pay | Admitting: Family Medicine

## 2020-02-05 ENCOUNTER — Telehealth: Payer: Self-pay

## 2020-02-11 DIAGNOSIS — I7300 Raynaud's syndrome without gangrene: Secondary | ICD-10-CM | POA: Diagnosis not present

## 2020-02-11 DIAGNOSIS — M359 Systemic involvement of connective tissue, unspecified: Secondary | ICD-10-CM | POA: Diagnosis not present

## 2020-02-11 DIAGNOSIS — M810 Age-related osteoporosis without current pathological fracture: Secondary | ICD-10-CM | POA: Diagnosis not present

## 2020-02-11 DIAGNOSIS — J849 Interstitial pulmonary disease, unspecified: Secondary | ICD-10-CM | POA: Diagnosis not present

## 2020-02-11 DIAGNOSIS — M25551 Pain in right hip: Secondary | ICD-10-CM | POA: Diagnosis not present

## 2020-02-11 DIAGNOSIS — D649 Anemia, unspecified: Secondary | ICD-10-CM | POA: Diagnosis not present

## 2020-02-18 DIAGNOSIS — M1611 Unilateral primary osteoarthritis, right hip: Secondary | ICD-10-CM | POA: Diagnosis not present

## 2020-02-23 ENCOUNTER — Ambulatory Visit: Payer: Medicare Other | Admitting: Family Medicine

## 2020-02-24 ENCOUNTER — Other Ambulatory Visit: Payer: Self-pay | Admitting: Rheumatology

## 2020-02-24 DIAGNOSIS — J849 Interstitial pulmonary disease, unspecified: Secondary | ICD-10-CM

## 2020-03-01 ENCOUNTER — Other Ambulatory Visit: Payer: Self-pay

## 2020-03-01 ENCOUNTER — Ambulatory Visit (INDEPENDENT_AMBULATORY_CARE_PROVIDER_SITE_OTHER): Payer: Medicare Other | Admitting: Family Medicine

## 2020-03-01 ENCOUNTER — Encounter: Payer: Self-pay | Admitting: Family Medicine

## 2020-03-01 VITALS — BP 104/69 | HR 71 | Temp 97.8°F | Ht 63.5 in | Wt 119.8 lb

## 2020-03-01 DIAGNOSIS — F32A Depression, unspecified: Secondary | ICD-10-CM

## 2020-03-01 DIAGNOSIS — E782 Mixed hyperlipidemia: Secondary | ICD-10-CM

## 2020-03-01 DIAGNOSIS — D500 Iron deficiency anemia secondary to blood loss (chronic): Secondary | ICD-10-CM

## 2020-03-01 DIAGNOSIS — R0602 Shortness of breath: Secondary | ICD-10-CM

## 2020-03-01 DIAGNOSIS — I2510 Atherosclerotic heart disease of native coronary artery without angina pectoris: Secondary | ICD-10-CM | POA: Diagnosis not present

## 2020-03-01 DIAGNOSIS — R002 Palpitations: Secondary | ICD-10-CM

## 2020-03-01 DIAGNOSIS — F419 Anxiety disorder, unspecified: Secondary | ICD-10-CM

## 2020-03-01 MED ORDER — SERTRALINE HCL 100 MG PO TABS: 100.0000 mg | ORAL_TABLET | Freq: Every day | ORAL | 1 refills | Status: DC

## 2020-03-01 MED ORDER — OMEPRAZOLE 20 MG PO CPDR: 20.0000 mg | DELAYED_RELEASE_CAPSULE | Freq: Every day | ORAL | 3 refills | Status: AC

## 2020-03-01 NOTE — Assessment & Plan Note (Signed)
Mildly exacerbated due to breathing issue. Will continue current dose of sertraline. Call if getting worse. Continue to monitor.

## 2020-03-01 NOTE — Assessment & Plan Note (Signed)
Of unclear etiology- possibly due to her pulmonary fibrosis. Concern for possible paroxismal a fib- EKG did not show this today. If it happens again, will call and we'll get a holter monitor. Follow up with pulmonology as scheduled. Checking labs today. Call with any concerns.

## 2020-03-01 NOTE — Assessment & Plan Note (Signed)
Rechecking labs today. Await results. Treat as needed.

## 2020-03-01 NOTE — Progress Notes (Signed)
BP 104/69   Pulse 71   Temp 97.8 F (36.6 C) (Oral)   Ht 5' 3.5" (1.613 m)   Wt 119 lb 12.8 oz (54.3 kg)   LMP  (LMP Unknown)   SpO2 93%   BMI 20.89 kg/m    Subjective:    Patient ID: CAPTOLA TESCHNER, female    DOB: March 18, 1946, 74 y.o.   MRN: [id number]  HPI: WALDINE ZENZ is a 74 y.o. female  Chief Complaint  Patient presents with  . Anxiety    Follow up. 0 GAD7  . Depression    8 PHQ9  . Flu Vaccine    High dose.   Breathing has been doing worse. She has been very out of breath. She notes that she needs her oxygen more and has been feeling more out of breath. She notes that her heart has been racing and she has not been feeling well. A few days ago  Her HR gor up to 135 and stayed there for about 15 minutes. She is seeing her pulmonologist in December and they are going to repeating her CXR at that time. She is not sure if if it's from her breathing or if something else is going on.   DEPRESSION- Mood has been doing wore, but she thinks that it is because of her breathing and the fact that she can't do anything. This is very frustrating for her.  Mood status: worse Satisfied with current treatment?: yes Symptom severity: moderate  Duration of current treatment : chronic Side effects: no Medication compliance: excellent compliance Psychotherapy/counseling: no  Depressed mood: yes Anxious mood: no Anhedonia: no Significant weight loss or gain: no Insomnia: no  Fatigue: yes Feelings of worthlessness or guilt: no Impaired concentration/indecisiveness: no Suicidal ideations: no Hopelessness: no Crying spells: no Depression screen [facility] 2/9 03/01/2020 08/24/2019 08/24/2019 07/22/2019 02/04/2019  Decreased Interest 1 0 0 0 0  Down, Depressed, Hopeless 1 0 0 0 0  PHQ - 2 Score 2 0 0 0 0  Altered sleeping 1 2 - - 1  Tired, decreased energy 3 2 - - 3  Change in appetite 2 2 - - 0  Feeling bad or failure about yourself  0 0 - - 0  Trouble concentrating 0 0 - - 0  Moving  slowly or fidgety/restless 0 0 - - 0  Suicidal thoughts 0 0 - - 0  PHQ-9 Score 8 6 - - 4  Difficult doing work/chores Not difficult at all Somewhat difficult - - Not difficult at all  Some recent data might be hidden   HYPERLIPIDEMIA Hyperlipidemia status: excellent compliance Satisfied with current treatment?  yes Side effects:  no Medication compliance: excellent compliance Past cholesterol meds: None Supplements: none Aspirin:  no The 10-year ASCVD risk score Mikey Bussing DC Jr., et al., 2013) is: 8.9%   Values used to calculate the score:     Age: 63 years     Sex: Female     Is Non-Hispanic African American: No     Diabetic: No     Tobacco smoker: No     Systolic Blood Pressure: 557 mmHg     Is BP treated: No     HDL Cholesterol: 67 mg/dL     Total Cholesterol: 216 mg/dL Chest pain:  no  Relevant past medical, surgical, family and social history reviewed and updated as indicated. Interim medical history since our last visit reviewed. Allergies and medications reviewed and updated.  Review of Systems  Constitutional: Positive  for fatigue. Negative for activity change, appetite change, chills, diaphoresis, fever and unexpected weight change.  Respiratory: Positive for shortness of breath. Negative for apnea, cough, choking, chest tightness, wheezing and stridor.   Cardiovascular: Positive for palpitations. Negative for chest pain and leg swelling.  Gastrointestinal: Negative.   Musculoskeletal: Negative.   Neurological: Negative.   Psychiatric/Behavioral: Negative.     Per HPI unless specifically indicated above     Objective:    BP 104/69   Pulse 71   Temp 97.8 F (36.6 C) (Oral)   Ht 5' 3.5" (1.613 m)   Wt 119 lb 12.8 oz (54.3 kg)   LMP  (LMP Unknown)   SpO2 93%   BMI 20.89 kg/m   Wt Readings from Last 3 Encounters:  03/01/20 119 lb 12.8 oz (54.3 kg)  10/15/19 124 lb 12.8 oz (56.6 kg)  10/01/19 120 lb (54.4 kg)    Physical Exam Vitals and nursing note  reviewed.  Constitutional:      General: She is not in acute distress.    Appearance: Normal appearance. She is not ill-appearing, toxic-appearing or diaphoretic.  HENT:     Head: Normocephalic and atraumatic.     Right Ear: External ear normal.     Left Ear: External ear normal.     Nose: Nose normal.     Mouth/Throat:     Mouth: Mucous membranes are moist.     Pharynx: Oropharynx is clear.  Eyes:     General: No scleral icterus.       Right eye: No discharge.        Left eye: No discharge.     Extraocular Movements: Extraocular movements intact.     Conjunctiva/sclera: Conjunctivae normal.     Pupils: Pupils are equal, round, and reactive to light.  Cardiovascular:     Rate and Rhythm: Normal rate and regular rhythm.     Pulses: Normal pulses.     Heart sounds: Normal heart sounds. No murmur heard.  No friction rub. No gallop.   Pulmonary:     Effort: Pulmonary effort is normal. No respiratory distress.     Breath sounds: Normal breath sounds. No stridor. No wheezing, rhonchi or rales.  Chest:     Chest wall: No tenderness.  Musculoskeletal:        General: Normal range of motion.     Cervical back: Normal range of motion and neck supple.  Skin:    General: Skin is warm and dry.     Capillary Refill: Capillary refill takes less than 2 seconds.     Coloration: Skin is not jaundiced or pale.     Findings: No bruising, erythema, lesion or rash.  Neurological:     General: No focal deficit present.     Mental Status: She is alert and oriented to person, place, and time. Mental status is at baseline.  Psychiatric:        Mood and Affect: Mood normal.        Behavior: Behavior normal.        Thought Content: Thought content normal.        Judgment: Judgment normal.     Results for orders placed or performed during the hospital encounter of 10/01/19  Surgical pathology  Result Value Ref Range   SURGICAL PATHOLOGY      SURGICAL PATHOLOGY CASE: [id number] PATIENT:  Christianne Dolin Surgical Pathology Report     Specimen Submitted: A. Stomach; cbx B. Stomach polyp; cbx C. Colon, thickened fold  at 65 cm; cbx  Clinical History: Colonoscopy occult blood in stool R19.5 EGD nausea R11.0 abdominal pain R10.9.  Gastric polyp, esophagitis, internal hemorrhoids, diverticulosis, thickened fold 65 cm      DIAGNOSIS: A. STOMACH; COLD BIOPSY: - GASTRIC MUCOSA WITH NO SIGNIFICANT PATHOLOGIC ALTERATION. - NEGATIVE FOR ACTIVE INFLAMMATION AND H PYLORI. - NEGATIVE FOR INTESTINAL METAPLASIA, DYSPLASIA, AND MALIGNANCY.  B. STOMACH POLYP; COLD BIOPSY: - FUNDIC GLAND POLYP. - NEGATIVE FOR DYSPLASIA AND MALIGNANCY.  C. COLON, THICKENED FOLD AT 65 CM; COLD BIOPSY: - COLONIC MUCOSA WITH SUPERFICIAL MILD HYPERPLASTIC CHANGES. - NEGATIVE FOR DYSPLASIA AND MALIGNANCY.    GROSS DESCRIPTION: A. Labeled: Gastric rule out H. pylori, cold biopsy Received: In formalin Tissue fragment(s): 2 Size:  0.6 x 0.3 x 0.2 cm Description: Aggregate of tan soft tissue fragments Entirely submitted in cassette A1.  B. Labeled: Stomach polyp, cold biopsy Received: In formalin Tissue fragment(s): 2 Size: 0.5 x 0.3 x 0.2 cm Description: Aggregate of tan soft tissue fragments Entirely submitted in B1.  C. Labeled: Colon, thickened fold at 65 cm, cold biopsy Received: In formalin Tissue fragment(s): 3 Size: 0.5 x 0.4 x 0.2 cm Description: Aggregate of tan soft tissue fragments Entirely submitted in cassette C1.   Final Diagnosis performed by Betsy Pries, MD.   Electronically signed 10/02/2019 10:44:43AM The electronic signature indicates that the named Attending Pathologist has evaluated the specimen Technical component performed at [facility], [address] [place], [place] [address] Lab: [phone] Dir: Rush Farmer, MD, MMM  Professional component performed at [facility], [facility], [place], [place], [place] [address] Lab:  [phone] Sharma Covert r: Dellia Nims. Reuel Derby, MD       Assessment & Plan:   Problem List Items Addressed This Visit      Other   Anxiety and depression    Mildly exacerbated due to breathing issue. Will continue current dose of sertraline. Call if getting worse. Continue to monitor.       Relevant Medications   sertraline (ZOLOFT) 100 MG tablet   Hyperlipidemia    Rechecking labs today. Await results. Treat as needed.       Relevant Orders   Comprehensive metabolic panel   Lipid Panel w/o Chol/HDL Ratio   SOB (shortness of breath)    Of unclear etiology- possibly due to her pulmonary fibrosis. Concern for possible paroxismal a fib- EKG did not show this today. If it happens again, will call and we'll get a holter monitor. Follow up with pulmonology as scheduled. Checking labs today. Call with any concerns.       Relevant Orders   EKG 12-Lead (Completed)   Iron deficiency anemia due to chronic blood loss - Primary    Rechecking labs today. Await results. Treat as needed.      Relevant Orders   CBC with Differential/Platelet    Other Visit Diagnoses    Palpitations       Concern for paroxysmal A fib with HR up to 130s- will call if happens again and we will get holter monitor. Continue to monitor. Call with any concerns.    Relevant Orders   EKG 12-Lead (Completed)       Follow up plan: Return in about 6 months (around 08/30/2020).

## 2020-03-01 NOTE — Assessment & Plan Note (Signed)
Rechecking labs today. Await results. Treat as needed.  °

## 2020-03-02 ENCOUNTER — Other Ambulatory Visit: Payer: Medicare Other

## 2020-03-02 LAB — LIPID PANEL W/O CHOL/HDL RATIO
Cholesterol, Total: 213 mg/dL — ABNORMAL HIGH (ref 100–199)
HDL: 62 mg/dL (ref >39)
LDL Chol Calc (NIH): 118 mg/dL — ABNORMAL HIGH (ref 0–99)
Triglycerides: 193 mg/dL — ABNORMAL HIGH (ref 0–149)
VLDL Cholesterol Cal: 33 mg/dL (ref 5–40)

## 2020-03-02 LAB — COMPREHENSIVE METABOLIC PANEL
ALT: 18 IU/L (ref 0–32)
AST: 28 IU/L (ref 0–40)
Albumin/Globulin Ratio: 1.7 (ref 1.2–2.2)
Albumin: 4.2 g/dL (ref 3.7–4.7)
Alkaline Phosphatase: 61 IU/L (ref 44–121)
BUN/Creatinine Ratio: 13 (ref 12–28)
BUN: 10 mg/dL (ref 8–27)
Bilirubin Total: 0.2 mg/dL (ref 0.0–1.2)
CO2: 22 mmol/L (ref 20–29)
Calcium: 9.3 mg/dL (ref 8.7–10.3)
Chloride: 100 mmol/L (ref 96–106)
Creatinine, Ser: 0.78 mg/dL (ref 0.57–1.00)
GFR calc Af Amer: 87 mL/min/{1.73_m2} (ref >59)
GFR calc non Af Amer: 76 mL/min/{1.73_m2} (ref >59)
Globulin, Total: 2.5 g/dL (ref 1.5–4.5)
Glucose: 85 mg/dL (ref 65–99)
Potassium: 3.7 mmol/L (ref 3.5–5.2)
Sodium: 136 mmol/L (ref 134–144)
Total Protein: 6.7 g/dL (ref 6.0–8.5)

## 2020-03-02 LAB — CBC WITH DIFFERENTIAL/PLATELET
Basophils Absolute: 0 10*3/uL (ref 0.0–0.2)
Basos: 0 %
EOS (ABSOLUTE): 0.2 10*3/uL (ref 0.0–0.4)
Eos: 2 %
Hematocrit: 36.9 % (ref 34.0–46.6)
Hemoglobin: 11.6 g/dL (ref 11.1–15.9)
Immature Grans (Abs): 0 10*3/uL (ref 0.0–0.1)
Immature Granulocytes: 0 %
Lymphocytes Absolute: 1 10*3/uL (ref 0.7–3.1)
Lymphs: 13 %
MCH: 28.5 pg (ref 26.6–33.0)
MCHC: 31.4 g/dL — ABNORMAL LOW (ref 31.5–35.7)
MCV: 91 fL (ref 79–97)
Monocytes Absolute: 0.7 10*3/uL (ref 0.1–0.9)
Monocytes: 8 %
Neutrophils Absolute: 6 10*3/uL (ref 1.4–7.0)
Neutrophils: 77 %
Platelets: 284 10*3/uL (ref 150–450)
RBC: 4.07 x10E6/uL (ref 3.77–5.28)
RDW: 12.9 % (ref 11.7–15.4)
WBC: 7.9 10*3/uL (ref 3.4–10.8)

## 2020-03-02 NOTE — Progress Notes (Signed)
Interpreted by me on 03/01/20. Incomplete bundle with flipped t-waves. No sign of acute ischemia

## 2020-03-03 ENCOUNTER — Other Ambulatory Visit: Payer: Self-pay

## 2020-03-03 ENCOUNTER — Ambulatory Visit
Admission: RE | Admit: 2020-03-03 | Discharge: 2020-03-03 | Disposition: A | Discharge status: Home / Self Care | Payer: Medicare Other | Source: Ambulatory Visit | Attending: Rheumatology | Admitting: Rheumatology

## 2020-03-03 DIAGNOSIS — J849 Interstitial pulmonary disease, unspecified: Secondary | ICD-10-CM

## 2020-03-03 DIAGNOSIS — K219 Gastro-esophageal reflux disease without esophagitis: Secondary | ICD-10-CM | POA: Diagnosis not present

## 2020-03-15 ENCOUNTER — Telehealth: Payer: Self-pay

## 2020-04-05 DIAGNOSIS — M1611 Unilateral primary osteoarthritis, right hip: Secondary | ICD-10-CM | POA: Diagnosis not present

## 2020-04-05 DIAGNOSIS — M545 Low back pain, unspecified: Secondary | ICD-10-CM | POA: Diagnosis not present

## 2020-05-05 DIAGNOSIS — Z79899 Other long term (current) drug therapy: Secondary | ICD-10-CM | POA: Diagnosis not present

## 2020-05-05 DIAGNOSIS — J8410 Pulmonary fibrosis, unspecified: Secondary | ICD-10-CM | POA: Diagnosis not present

## 2020-05-05 DIAGNOSIS — J849 Interstitial pulmonary disease, unspecified: Secondary | ICD-10-CM | POA: Diagnosis not present

## 2020-05-05 DIAGNOSIS — I071 Rheumatic tricuspid insufficiency: Secondary | ICD-10-CM | POA: Diagnosis not present

## 2020-05-05 DIAGNOSIS — J984 Other disorders of lung: Secondary | ICD-10-CM | POA: Diagnosis not present

## 2020-05-05 DIAGNOSIS — R0602 Shortness of breath: Secondary | ICD-10-CM | POA: Diagnosis not present

## 2020-05-05 DIAGNOSIS — I517 Cardiomegaly: Secondary | ICD-10-CM | POA: Diagnosis not present

## 2020-05-25 ENCOUNTER — Telehealth: Payer: Self-pay | Admitting: *Deleted

## 2020-05-25 ENCOUNTER — Ambulatory Visit: Payer: Self-pay | Admitting: General Practice

## 2020-05-25 ENCOUNTER — Telehealth: Payer: Medicare Other | Admitting: General Practice

## 2020-05-25 ENCOUNTER — Other Ambulatory Visit: Payer: Self-pay | Admitting: Family Medicine

## 2020-05-25 DIAGNOSIS — R197 Diarrhea, unspecified: Secondary | ICD-10-CM

## 2020-05-25 DIAGNOSIS — E782 Mixed hyperlipidemia: Secondary | ICD-10-CM

## 2020-05-25 DIAGNOSIS — F419 Anxiety disorder, unspecified: Secondary | ICD-10-CM

## 2020-05-25 DIAGNOSIS — I2510 Atherosclerotic heart disease of native coronary artery without angina pectoris: Secondary | ICD-10-CM

## 2020-05-25 DIAGNOSIS — F32A Depression, unspecified: Secondary | ICD-10-CM

## 2020-05-25 DIAGNOSIS — J8410 Pulmonary fibrosis, unspecified: Secondary | ICD-10-CM

## 2020-05-25 DIAGNOSIS — R0602 Shortness of breath: Secondary | ICD-10-CM

## 2020-05-25 DIAGNOSIS — M1990 Unspecified osteoarthritis, unspecified site: Secondary | ICD-10-CM

## 2020-05-25 NOTE — Chronic Care Management (AMB) (Signed)
Chronic Care Management   Follow Up Note   05/25/2020 Name: Melanie Rollins MRN: [id number] DOB: May 04, 1946  Referred by: Valerie Roys, DO Reason for referral : Chronic Care Management (RNCM: Chronic Disease Management and Care Coordination Needs)   Melanie Rollins is a 75 y.o. year old female who is a primary care patient of Valerie Roys, DO. The CCM team was consulted for assistance with chronic disease management and care coordination needs.    Review of patient status, including review of consultants reports, relevant laboratory and other test results, and collaboration with appropriate care team members and the patient's provider was performed as part of comprehensive patient evaluation and provision of chronic care management services.    SDOH (Social Determinants of Health) assessments performed: Yes See Care Plan activities for detailed interventions related to [facility]     Outpatient Encounter Medications as of 05/25/2020  Medication Sig Note   traMADol (ULTRAM) 50 MG tablet Take 50 mg by mouth every 6 (six) hours as needed. 09/01/2019: ~couple times weekly   ACETAMINOPHEN PO Take 650 mg by mouth in the morning and at bedtime.     Acetylcysteine (NAC) 600 MG CAPS Take 1 capsule (600 mg total) by mouth 2 (two) times daily. (Patient taking differently: Take 1,200 mg by mouth daily. )    acyclovir ointment (ZOVIRAX) 5 % Apply 1 application topically every 3 (three) hours. 09/01/2019: Using PRN cold sores    baclofen (LIORESAL) 10 MG tablet Take 1 tablet (10 mg total) by mouth at bedtime as needed for muscle spasms. 09/01/2019: Using ~couple times weekly   BIOTIN PO Take 5,000 mcg by mouth daily.     Calcium Citrate-Vitamin D (CALCIUM + D PO) Take 600 mg by mouth in the morning and at bedtime.     Cholecalciferol (VITAMIN D) 2000 units CAPS Take 2,000 Units by mouth daily.    CINNAMON PO Take 1,000 mg by mouth daily.    denosumab (PROLIA) 60 MG/ML SOSY injection Inject into  the skin.    hydroxychloroquine (PLAQUENIL) 200 MG tablet Take 2 tablets (400 mg total) by mouth daily. (Patient taking differently: Take 200 mg by mouth daily. )    Multiple Vitamin (MULTIVITAMIN) tablet Take 1 tablet by mouth daily.    mupirocin ointment (BACTROBAN) 2 % Place 1 application into the nose 2 (two) times daily.    mycophenolate (CELLCEPT) 500 MG tablet Take 2 tablets (1,000 mg total) by mouth 2 (two) times daily.    NIFEdipine (PROCARDIA-XL/NIFEDICAL-XL) 30 MG 24 hr tablet Take 30 mg by mouth daily.    Omega-3 Fatty Acids (FISH OIL) 1000 MG CAPS Take 1,000 mg by mouth daily.    omeprazole (PRILOSEC) 20 MG capsule Take 1 capsule (20 mg total) by mouth daily.    OXYGEN 2 L/min by Continuous infusion (non-IV) route daily.    permethrin (ELIMITE) 5 % cream permethrin 5 % topical cream 09/01/2019: For rosacea   predniSONE (DELTASONE) 10 MG tablet Take 10 mg by mouth daily with breakfast.    sertraline (ZOLOFT) 100 MG tablet Take 1 tablet (100 mg total) by mouth daily.    sulfamethoxazole-trimethoprim (BACTRIM DS) 800-160 MG tablet Take 1 tablet by mouth 3 (three) times a week. 11/24/2019: PCP ppx   zolpidem (AMBIEN) 10 MG tablet Take 0.5 tablets (5 mg total) by mouth at bedtime.    No facility-administered encounter medications on file as of 05/25/2020.     Objective:   Goals Addressed  This Visit's Progress     COMPLETED: RNCM: I am a little stressed out about my upcoming procedure        CARE PLAN ENTRY (see longtitudinal plan of care for additional care plan information)  Current Barriers: Closing this goal- opening new goal in ELS  Chronic Disease Management support, education, and care coordination needs related to CAD, HLD, Anxiety, Depression, Pulmonary Disease, and Chronic hip pain  Clinical Goal(s) related to CAD, HLD, Anxiety, Depression, Pulmonary Disease, and Chronic hip pain :  Over the next 120 days, patient will:   Work with the care  management team to address educational, disease management, and care coordination needs   Begin or continue self health monitoring activities as directed today  continue to monitor diet, use sodium as instructed and watch fat content  Call provider office for new or worsened signs and symptoms Oxygen saturation lower than established parameter, Shortness of breath, and New or worsened symptom related to Chronic hip pain and other chronic conditions  Call care management team with questions or concerns  Verbalize basic understanding of patient centered plan of care established today  Interventions related to CAD, HLD, Anxiety, Depression, Pulmonary Disease, and Chronic Hip Pain :   Evaluation of current treatment plans and patient's adherence to plan as established by provider  Assessed patient understanding of disease states.  The patient has a good understanding of her chronic conditions. Says her anxiety is better since there is a plan in place to find out about her anemia.  The patient is doing well and has had her procedure. Nothing was found to cause the anemia and her counts have gone back up. She just celebrated her birthday and is happy and doing well today.   Assessed patient's education and care coordination needs. Denies any needs at this time. Knows there are resources available   Provided disease specific education to patient. The patient endorses compliance with heart health diet. No issues at this time with dietary compliance   Collaborated with appropriate clinical care team members regarding patient needs  Pharmacy referral for poly pharmacy, if she should take Magnesium and if any of her medications cause blurred vision- completed   Patient Self Care Activities related to CAD, HLD, Anxiety, Depression, Pulmonary Disease, and Chronic Hip Pain :   Patient is unable to independently self-manage chronic health conditions  Please see past updates related to this goal by  clicking on the "Past Updates" button in the selected goal        COMPLETED: RNCM: Pt -"Hopefully soon they will find out the reason for my anemia" (pt-stated)        CARE PLAN ENTRY (see longitudinal plan of care for additional care plan information)  Current Barriers: Closing care plan goal- opening in ELS  Knowledge Deficits related to upcoming procedures EGD and colonoscopy in hopes of finding out the source of her anemia  Chronic Disease Management support and education needs related to chronic anemia  Nurse Case Manager Clinical Goal(s):   Over the next 90 days, patient will verbalize understanding of plan for completion of colonoscopy and EGD in hopes of finding the source of her anemia  Over the next 90 days, patient will work with [facility], GI specialist, and pcp to address needs related to chronic anemia and etiology   Over the next 90 days, patient will attend all scheduled medical appointments: upcoming EGD and colonoscopy for June 2021  Interventions:   Inter-disciplinary care team collaboration (see longitudinal plan  of care)  Evaluation of current treatment plan related to chronic anemia and patient's adherence to plan as established by provider. The patient had her colonoscopy in May and no source of bleeding was found. She says her levels have gone back up and maybe it was just a fluke.   Advised patient to call RNCM for any education needs or needs before next outreach  Provided education to patient re: colonoscopy prep- completed  Discussed plans with patient for ongoing care management follow up and provided patient with direct contact information for care management team  Reviewed scheduled/upcoming provider appointments including: June 2021 patient will have a colonoscopy and EGD.  Next pcp appointment 02-23-2020  Patient Self Care Activities:   Patient verbalizes understanding of plan to completed additional scheduled testing in hopes of finding the etiology of  her chronic anemia  Attends all scheduled provider appointments  Calls provider office for new concerns or questions  Unable to independently manage chronic anemia   Please see past updates related to this goal by clicking on the "Past Updates" button in the selected goal         Patient Care Plan: RNCM: Coronary Artery Disease (Adult)    Problem Identified: RNCM: Disease Progression (Coronary Artery Disease)   Priority: Medium    Goal: RNCM: Disease Progression Prevented or Minimized   Priority: Medium  Note:   Current Barriers:   Knowledge Deficits related to management of CAD  Chronic Disease Management support and education needs related to CAD and subsequent underlying chronic conditions   Unable to independently manage CAD  Does not contact provider office for questions/concerns  Nurse Case Manager Clinical Goal(s):   Over the next 120 days, patient will verbalize understanding of plan for CAD  Over the next 120 days, patient will work with [facility] and CCM team to address needs related to CAD  Over the next 120 days, patient will attend all scheduled medical appointments: 08-31-2020 at 11 am  Interventions:   1:1 collaboration with Valerie Roys, DO regarding development and update of comprehensive plan of care as evidenced by provider attestation and co-signature  Inter-disciplinary care team collaboration (see longitudinal plan of care)  Evaluation of current treatment plan related to CAD and patient's adherence to plan as established by provider.  Advised patient to call the office for changes in condition or new questions or concerns   Provided education to patient re: heart healthy diet and maintaining healthy heart practices   Reviewed medications with patient and discussed compliance   Discussed plans with patient for ongoing care management follow up and provided patient with direct contact information for care management team  Reviewed  scheduled/upcoming provider appointments including: 08-31-2020 at 11 am  Patient Goals/Self-Care Activities Over the next 120 days, patient will:  - Patient will self administer medications as prescribed Patient will attend all scheduled provider appointments Patient will call pharmacy for medication refills Patient will call provider office for new concerns or questions Patient will work with BSW to address care coordination needs and will continue to work with the clinical team to address health care and disease management related needs.   - barriers to treatment adherence reviewed and addressed - communicable disease prevention promoted - difficulty of making life-long changes acknowledged - functional limitation screening reviewed - healthy lifestyle promoted - medication-adherence assessment completed - medication side effects managed - self-awareness of signs/symptoms of worsening disease encouraged  Follow Up Plan: Telephone follow up appointment with care management team member scheduled  for: 07-19-2020 at 0900 am       Task: Alleviate Barriers to Coronary Artery Disease Therapy   Note:   Care Management Activities:    - barriers to treatment adherence reviewed and addressed - communicable disease prevention promoted - difficulty of making life-long changes acknowledged - functional limitation screening reviewed - healthy lifestyle promoted - medication-adherence assessment completed - medication side effects managed - self-awareness of signs/symptoms of worsening disease encouraged       Patient Care Plan: RNCM: Chronic Pain (Adult)- right hip pain    Problem Identified: RNCM: Pain Management Plan (Chronic Pain)- right hip   Priority: Medium    Goal: RNCM: Pain Management Plan Developed- Right Hip Pain   Priority: Medium  Note:   Current Barriers:   Knowledge Deficits related to managing acute/chronic pain  Non-adherence to scheduled provider  appointments  Non-adherence to prescribed medication regimen  Difficulty obtaining medications  Chronic Disease Management support and education needs related to chronic pain  Unable to independently manage chronic right hip pain  Does not contact provider office for questions/concerns  Nurse Case Manager Clinical Goal(s):   Over the next 120 days, patient will verbalize understanding of plan for managing pain  Over the next 120 days, patient will meet with RN Care Manager to address chronic pain and discomfort   Over the next 120 days, patient will attend all scheduled medical appointments: 08-31-2020  Over the next  120 days patient will demonstrate use of different relaxation  skills and/or diversional activities to assist with pain reduction (distraction, imagery, relaxation, massage, acupressure, TENS, heat, and cold application  Over the next  120  days patient will report pain at a level less than 3 to 4 on a 10-10 rating scale  Over the next   120 days patient will use pharmacological and nonpharmacological pain relief strategies  Over the next 120  days patient will verbalize acceptable level of pain relief and ability to engage in desired activities  Over the next  120 days patient will engage in desired activities without an increase in pain level  Interventions:   - deep breathing, relaxation and mindfulness use promoted  - effectiveness of pharmacologic therapy monitored  - pain assessed  - pain treatment goals reviewed  Evaluation of current treatment plan related to pain in right hip and patient's adherence to plan as established by provider.  Advised patient to call the provider for worsening pain and discomfort that is unresolved by current pain management regimen  Provided education to patient re: safety and use of DME as needed   Reviewed medications with patient and discussed compliance. The patient is using Tylenol and Ultram to manage pain and  discomfort   Collaborated with CCM team regarding effective management of pain and review of medications to treat pain. The patient also working with the specialist   Discussed plans with patient for ongoing care management follow up and provided patient with direct contact information for care management team  Allow patient to maintain a diary of pain ratings, timing, precipitating events, medications, treatments, and what works best to relieve pain,   Refer to support groups and self-help groups  Educate patient about the use of pharmacological interventions for pain management- antianxiety, antidepressants, NSAIDS, opioid analgesics,   Explain the importance of lifestyle modifications to effective pain management   Patient Goals/Self Care Activities:   Patient verbalizes understanding of plan to manage pain and discomfort  Self-administers medications as prescribed  Attends all scheduled provider  appointments  Calls pharmacy for medication refills  Calls provider office for new concerns or questions  - mutually acceptable comfort goal set  - pain assessed  - pain management plan developed  - pain treatment goals reviewed  - patient response to treatment assessed  - sharing of pain management plan with teachers and other caregivers encouraged  Follow Up Plan: Telephone follow up appointment with care management team member scheduled for: 07-19-2020 at 0900 am     Task: RNCM: Partner to Develop Chronic Pain Management Plan   Note:   Care Management Activities:    - mutually acceptable comfort goal set - pain assessed - pain management plan developed - pain treatment goals reviewed - patient response to treatment assessed - sharing of pain management plan with teachers and other caregivers encouraged      Patient Care Plan: RNCM: Depression (Adult)    Problem Identified: RNCM: Depression Identification (Depression)   Priority: Medium    Goal: RNCM: Axniety and  Depressive Symptoms Identified   Note:   Current Barriers:   Knowledge Deficits related to anxiety and depression exacerbated by chronic conditions  Care Coordination needs related to management of anxiety and depression in a patient with multiple chronic conditions and chronic diarrhea over the last several months getting worse  Chronic Disease Management support and education needs related to anxiety and depression in patient with chronic conditions and chronic diarrhea over the last several months.   Unable to independently manage anxiety and depression as evidence of chronic conditions that exacerbate conditions  Lacks social connections  Does not contact provider office for questions/concerns  Nurse Case Manager Clinical Goal(s):   Over the next 120 days, patient will verbalize understanding of plan for management of anxiety and depression and chronic diarrhea   Over the next 120 days, patient will work with [facility], CCM team, and pcp to address needs related to anxiety and depression caused by chronic diarrhea and other chronic conditions  Over the next 120 days, patient will demonstrate a decrease in chronic diarrhea  exacerbations as evidenced by decreased episodes of diarrhea, depression and anxiety.  Over the next 120 days, patient will attend all scheduled medical appointments: 08-31-2020  Over the next 120 days, patient will demonstrate improved adherence to prescribed treatment plan for Anxiety and depression  as evidenced bydecreased episodes of diarrhea and stabilized chronic conditions  Over the next 120 days, patient will demonstrate improved health management independence as evidenced byimproved symptoms and ability to effectively manage care   Over the next 120 days, patient will work with CM team pharmacist to review medications that may be contributing to chronic diarrhea  Over the next 120 days, patient will work with CM clinical social worker to manage depression  and anxiety.   Interventions:   1:1 collaboration with Valerie Roys, DO regarding development and update of comprehensive plan of care as evidenced by provider attestation and co-signature  Inter-disciplinary care team collaboration (see longitudinal plan of care)  Evaluation of current treatment plan related to Depression and anxiety  and patient's adherence to plan as established by provider.  Advised patient to call the provider for change or worsening sx and sx of depression, anxiety and chronic diarrhea  Provided education to patient re: staying hydrated and eating balanced diet. To write down factors that may contribute to exacerbation of diarrhea   Reviewed medications with patient and discussed compliance and possible medications that could contribute to diarrhea   Collaborated with CCM team pharmacist  regarding medications that could cause diarrhea   Discussed plans with patient for ongoing care management follow up and provided patient with direct contact information for care management team  Reviewed scheduled/upcoming provider appointments including: 08-31-2020 at 11 am  Social Work referral for ongoing support for depression and anxiety  Pharmacy referral for review of medications for possible medication that causes diarrhea   Patient Goals/Self-Care Activities Over the next 120 days, patient will:  - Patient will self administer medications as prescribed Patient will attend all scheduled provider appointments Patient will call pharmacy for medication refills Patient will call provider office for new concerns or questions Patient will work with BSW to address care coordination needs and will continue to work with the clinical team to address health care and disease management related needs.    - anxiety screen reviewed - depression screen reviewed - medication list reviewed  Follow Up Plan: Telephone follow up appointment with care management team member  scheduled for: 07-19-2020 at 0900 am       Task: RNCM: Anxiety and Depression/Identify Depressive Symptoms and Facilitate Treatment   Note:   Care Management Activities:    - anxiety screen reviewed - depression screen reviewed - medication list reviewed       Patient Care Plan: RNCM: HLD Disease Management    Problem Identified: [place] Promotion or Disease Self-Management (General Plan of Care)   Priority: Medium    Goal: RNCM: HLDSelf-Management Plan Developed   Priority: Medium  Note:   Current Barriers:   Poorly controlled hyperlipidemia, complicated by CAD, pulmonary fibrosis, possible new diagnosis of Pulmonary HTN  Current antihyperlipidemic regimen: Omega 3 fatty acids 1000 mg daily  Most recent lipid panel:     Component Value Date/Time   CHOL 213 (H) 03/01/2020 1050   TRIG 193 (H) 03/01/2020 1050   HDL 62 03/01/2020 1050   LDLCALC 118 (H) 03/01/2020 1050     ASCVD risk enhancing conditions: age >34, CAD, pulmonary fibrosis, chronic pain  Unable to independently manage HLD  Does not contact provider office for questions/concerns  RN Care Manager Clinical Goal(s):   Over the next 120 days, patient will work with Consulting civil engineer, providers, and care team towards execution of optimized self-health management plan  Over the next 120 days, patient will verbalize understanding of plan for HLD management   Over the next 120 days, patient will attend all scheduled medical appointments: 08-31-2020  Interventions:  Medication review performed; medication list updated in electronic medical record.   Inter-disciplinary care team collaboration (see longitudinal plan of care)  Referred to pharmacy team for assistance with HLD medication management  Evaluation of current treatment plan related to HLD and patient's adherence to plan as established by provider.  Advised patient to call the office for changes in condition or questions  Provided education  to patient re: staying hydrated and eating a balanced meal  Reviewed medications with patient and discussed compliance   Discussed plans with patient for ongoing care management follow up and provided patient with direct contact information for care management team  Reviewed scheduled/upcoming provider appointments including: 08-31-2020 at 11 am. The patient has several upcoming appointments at this time due to changes in pulmonary status   Patient Goals/Self-Care Activities:  Over the next 120 days, patient will:   - call for medicine refill 2 or 3 days before it runs out - call if I am sick and can't take my medicine - keep a list of all the medicines  I take; vitamins and herbals too - learn to read medicine labels - use a pillbox to sort medicine - use an alarm clock or phone to remind me to take my medicine - drink 6 to 8 glasses of water each day - eat 3 to 5 servings of fruits and vegetables each day - eat 5 or 6 small meals each day - limit fast food meals to no more than 1 per week - manage portion size - prepare main meal at home 3 to 5 days each week - read food labels for fat, fiber, carbohydrates and portion size - set a realistic goal - be open to making changes - I can manage, know and watch for signs of a heart attack - if I have chest pain, call for help - learn about small changes that will make a big difference - learn my personal risk factors - barriers to meeting goals identified - choices provided - collaboration with team encouraged - decision-making supported - health risks reviewed - problem-solving facilitated - questions answered - reassurance provided - resources needed to meet goals identified - self-reliance encouraged - verbalization of feelings encouraged   Follow Up Plan: Telephone follow up appointment with care management team member scheduled for: 07-19-2020 at 0900 am     Task: RNCM: HLD-Mutually Develop and Royce [place]adamia Achievement of Patient  Goals   Note:   Care Management Activities:    - barriers to meeting goals identified - choices provided - collaboration with team encouraged - decision-making supported - health risks reviewed - problem-solving facilitated - questions answered - reassurance provided - resources needed to meet goals identified - self-reliance encouraged - verbalization of feelings encouraged       Patient Care Plan: RNCM: Pulmonary Fibrosis/Shortness of Breath Plan of Care    Problem Identified: RNCM: Pulmonary Fibrosis and Shortness of Breath   Priority: Medium    Goal: RNCM: Pulmonary Fibrosis and Shortness of breath Disease Progression Minimized or Managed   Priority: Medium  Note:   Current Barriers:   Knowledge deficits related to basic understanding of Pulmonary Fibrosis and shortness of Breath disease process  Knowledge deficits related to basic Pulmonary Fibrosis self care/management  Knowledge deficit related to importance of energy conservation  Unable to independently manage Pulmonary Fibrosis as evidence of being oxygen dependent and worsening shortness of breath.  Does not contact provider office for questions/concerns  Case Manager Clinical Goal(s):  Over the next 120 days, patient will be able to verbalize understanding of Pulmonary Fibrosis action plan and when to seek appropriate levels of medical care  Over the next 120 days, patient will engage in lite exercise as tolerated to build/regain stamina and strength and reduce shortness of breath through activity tolerance  Over the next 120 days, patient will verbalize basic understanding of Pulmonary Fibrosis disease process and self care activities  Over the next 120 days, patient will not be hospitalized for Pulmonary fibrosis exacerbation  Interventions:   UNABLE to independently: manage pulmonary fibrosis   Provided patient with basic written and verbal pulmonary fibrosis  education on self care/management/and  exacerbation prevention   Provided patient with Pulmonary fibrosis  action plan and reinforced importance of daily self assessment  Provided written and verbal instructions on pursed lip breathing and utilized returned demonstration as teach back  Provided instruction about proper use of medications used for management of pulmonary fibrosis including inhalers  Advised patient to self assesses pulmonary fibrosis action plan zone and make appointment with provider if  in the yellow zone for 48 hours without improvement.  Provided patient with education about the role of exercise in the management of Pulmonary fibrosis   Provided education about and advised patient to utilize infection prevention strategies to reduce risk of respiratory infection  Patient Goals/Self-Care Activities:   - activity or exercise based on tolerance encouraged  - barriers to treatment managed  - breathing techniques encouraged  - communicable disease prevention promoted  - medication-adherence assessment completed  - quality of sleep assessed  - rescue (action) plan reviewed  - screen for functional limitations completed and reviewed  - self-awareness of symptom triggers encouraged Follow Up Plan: Telephone follow up appointment with care management team member scheduled for:  07-19-2020 at 0900 am   Task: RNCM: Alleviate Barriers to Pulmonary Fiborosis  Management   Note:   Care Management Activities:    - activity or exercise based on tolerance encouraged - barriers to treatment managed - breathing techniques encouraged - communicable disease prevention promoted - medication-adherence assessment completed - quality of sleep assessed - rescue (action) plan reviewed - screen for functional limitations completed and reviewed - self-awareness of symptom triggers encouraged         Plan:   Telephone follow up appointment with care management team member scheduled for: 07-19-2020 at 0900  am   [place], MSN, [facility] Mobile: [phone]

## 2020-05-25 NOTE — Patient Instructions (Signed)
Visit Information  Patient Care Plan: RNCM: Coronary Artery Disease (Adult)    Problem Identified: RNCM: Disease Progression (Coronary Artery Disease)   Priority: Medium    Goal: RNCM: Disease Progression Prevented or Minimized   Priority: Medium  Note:   Current Barriers:  Marland Kitchen Knowledge Deficits related to management of CAD . Chronic Disease Management support and education needs related to CAD and subsequent underlying chronic conditions  . Unable to independently manage CAD . Does not contact provider office for questions/concerns  Nurse Case Manager Clinical Goal(s):  Marland Kitchen Over the next 120 days, patient will verbalize understanding of plan for CAD . Over the next 120 days, patient will work with [facility] and CCM team to address needs related to CAD . Over the next 120 days, patient will attend all scheduled medical appointments: 08-31-2020 at 11 am  Interventions:  . 1:1 collaboration with Valerie Roys, DO regarding development and update of comprehensive plan of care as evidenced by provider attestation and co-signature . Inter-disciplinary care team collaboration (see longitudinal plan of care) . Evaluation of current treatment plan related to CAD and patient's adherence to plan as established by provider. . Advised patient to call the office for changes in condition or new questions or concerns  . Provided education to patient re: heart healthy diet and maintaining healthy heart practices  . Reviewed medications with patient and discussed compliance  . Discussed plans with patient for ongoing care management follow up and provided patient with direct contact information for care management team . Reviewed scheduled/upcoming provider appointments including: 08-31-2020 at 11 am  Patient Goals/Self-Care Activities Over the next 120 days, patient will:  - Patient will self administer medications as prescribed Patient will attend all scheduled provider appointments Patient will call  pharmacy for medication refills Patient will call provider office for new concerns or questions Patient will work with BSW to address care coordination needs and will continue to work with the clinical team to address health care and disease management related needs.   - barriers to treatment adherence reviewed and addressed - communicable disease prevention promoted - difficulty of making life-long changes acknowledged - functional limitation screening reviewed - healthy lifestyle promoted - medication-adherence assessment completed - medication side effects managed - self-awareness of signs/symptoms of worsening disease encouraged  Follow Up Plan: Telephone follow up appointment with care management team member scheduled for: 07-19-2020 at 0900 am       Task: Alleviate Barriers to Coronary Artery Disease Therapy   Note:   Care Management Activities:    - barriers to treatment adherence reviewed and addressed - communicable disease prevention promoted - difficulty of making life-long changes acknowledged - functional limitation screening reviewed - healthy lifestyle promoted - medication-adherence assessment completed - medication side effects managed - self-awareness of signs/symptoms of worsening disease encouraged       Patient Care Plan: RNCM: Chronic Pain (Adult)- right hip pain    Problem Identified: RNCM: Pain Management Plan (Chronic Pain)- right hip   Priority: Medium    Goal: RNCM: Pain Management Plan Developed- Right Hip Pain   Priority: Medium  Note:   Current Barriers:  Marland Kitchen Knowledge Deficits related to managing acute/chronic pain . Non-adherence to scheduled provider appointments . Non-adherence to prescribed medication regimen . Difficulty obtaining medications . Chronic Disease Management support and education needs related to chronic pain . Unable to independently manage chronic right hip pain . Does not contact provider office for  questions/concerns  Nurse Case Manager Clinical Goal(s):  .  Over the next 120 days, patient will verbalize understanding of plan for managing pain . Over the next 120 days, patient will meet with RN Care Manager to address chronic pain and discomfort  . Over the next 120 days, patient will attend all scheduled medical appointments: 08-31-2020 . Over the next  120 days patient will demonstrate use of different relaxation  skills and/or diversional activities to assist with pain reduction (distraction, imagery, relaxation, massage, acupressure, TENS, heat, and cold application . Over the next  120  days patient will report pain at a level less than 3 to 4 on a 10-10 rating scale . Over the next   120 days patient will use pharmacological and nonpharmacological pain relief strategies . Over the next 120  days patient will verbalize acceptable level of pain relief and ability to engage in desired activities . Over the next  120 days patient will engage in desired activities without an increase in pain level  Interventions:  . - deep breathing, relaxation and mindfulness use promoted . - effectiveness of pharmacologic therapy monitored . - pain assessed . - pain treatment goals reviewed . Evaluation of current treatment plan related to pain in right hip and patient's adherence to plan as established by provider. . Advised patient to call the provider for worsening pain and discomfort that is unresolved by current pain management regimen . Provided education to patient re: safety and use of DME as needed  . Reviewed medications with patient and discussed compliance. The patient is using Tylenol and Ultram to manage pain and discomfort  . Collaborated with CCM team regarding effective management of pain and review of medications to treat pain. The patient also working with the specialist  . Discussed plans with patient for ongoing care management follow up and provided patient with direct contact  information for care management team . Allow patient to maintain a diary of pain ratings, timing, precipitating events, medications, treatments, and what works best to relieve pain,  . Refer to support groups and self-help groups . Educate patient about the use of pharmacological interventions for pain management- antianxiety, antidepressants, NSAIDS, opioid analgesics,  . Explain the importance of lifestyle modifications to effective pain management   Patient Goals/Self Care Activities:  . Patient verbalizes understanding of plan to manage pain and discomfort . Self-administers medications as prescribed . Attends all scheduled provider appointments . Calls pharmacy for medication refills . Calls provider office for new concerns or questions . - mutually acceptable comfort goal set . - pain assessed . - pain management plan developed . - pain treatment goals reviewed . - patient response to treatment assessed . - sharing of pain management plan with teachers and other caregivers encouraged  Follow Up Plan: Telephone follow up appointment with care management team member scheduled for: 07-19-2020 at 0900 am     Task: RNCM: Partner to Develop Chronic Pain Management Plan   Note:   Care Management Activities:    - mutually acceptable comfort goal set - pain assessed - pain management plan developed - pain treatment goals reviewed - patient response to treatment assessed - sharing of pain management plan with teachers and other caregivers encouraged      Patient Care Plan: RNCM: Depression (Adult)    Problem Identified: RNCM: Depression Identification (Depression)   Priority: Medium    Goal: RNCM: Axniety and Depressive Symptoms Identified   Note:   Current Barriers:  Marland Kitchen Knowledge Deficits related to anxiety and depression exacerbated by chronic conditions .  Care Coordination needs related to management of anxiety and depression in a patient with multiple chronic conditions and  chronic diarrhea over the last several months getting worse . Chronic Disease Management support and education needs related to anxiety and depression in patient with chronic conditions and chronic diarrhea over the last several months.  . Unable to independently manage anxiety and depression as evidence of chronic conditions that exacerbate conditions . Lacks social connections . Does not contact provider office for questions/concerns  Nurse Case Manager Clinical Goal(s):  Marland Kitchen Over the next 120 days, patient will verbalize understanding of plan for management of anxiety and depression and chronic diarrhea  . Over the next 120 days, patient will work with [facility], [place] team, and pcp to address needs related to anxiety and depression caused by chronic diarrhea and other chronic conditions . Over the next 120 days, patient will demonstrate a decrease in chronic diarrhea  exacerbations as evidenced by decreased episodes of diarrhea, depression and anxiety. . Over the next 120 days, patient will attend all scheduled medical appointments: 08-31-2020 . Over the next 120 days, patient will demonstrate improved adherence to prescribed treatment plan for Anxiety and depression  as evidenced bydecreased episodes of diarrhea and stabilized chronic conditions . Over the next 120 days, patient will demonstrate improved health management independence as evidenced byimproved symptoms and ability to effectively manage care  . Over the next 120 days, patient will work with CM team pharmacist to review medications that may be contributing to chronic diarrhea . Over the next 120 days, patient will work with CM clinical social worker to manage depression and anxiety.   Interventions:  . 1:1 collaboration with Valerie Roys, DO regarding development and update of comprehensive plan of care as evidenced by provider attestation and co-signature . Inter-disciplinary care team collaboration (see longitudinal plan of  care) . Evaluation of current treatment plan related to Depression and anxiety  and patient's adherence to plan as established by provider. . Advised patient to call the provider for change or worsening sx and sx of depression, anxiety and chronic diarrhea . Provided education to patient re: staying hydrated and eating balanced diet. To write down factors that may contribute to exacerbation of diarrhea  . Reviewed medications with patient and discussed compliance and possible medications that could contribute to diarrhea  . Collaborated with CCM team pharmacist  regarding medications that could cause diarrhea  . Discussed plans with patient for ongoing care management follow up and provided patient with direct contact information for care management team . Reviewed scheduled/upcoming provider appointments including: 08-31-2020 at 11 am . Social Work referral for ongoing support for depression and anxiety . Pharmacy referral for review of medications for possible medication that causes diarrhea   Patient Goals/Self-Care Activities Over the next 120 days, patient will:  - Patient will self administer medications as prescribed Patient will attend all scheduled provider appointments Patient will call pharmacy for medication refills Patient will call provider office for new concerns or questions Patient will work with BSW to address care coordination needs and will continue to work with the clinical team to address health care and disease management related needs.    - anxiety screen reviewed - depression screen reviewed - medication list reviewed  Follow Up Plan: Telephone follow up appointment with care management team member scheduled for: 07-19-2020 at 0900 am       Task: RNCM: Anxiety and Depression/Identify Depressive Symptoms and Facilitate Treatment   Note:   Care Management  Activities:    - anxiety screen reviewed - depression screen reviewed - medication list reviewed        Patient Care Plan: RNCM: HLD Disease Management    Problem Identified: [place] Promotion or Disease Self-Management (General Plan of Care)   Priority: Medium    Goal: RNCM: HLDSelf-Management Plan Developed   Priority: Medium  Note:   Current Barriers:  . Poorly controlled hyperlipidemia, complicated by CAD, pulmonary fibrosis, possible new diagnosis of Pulmonary HTN . Current antihyperlipidemic regimen: Omega 3 fatty acids 1000 mg daily . Most recent lipid panel:     Component Value Date/Time   CHOL 213 (H) 03/01/2020 1050   TRIG 193 (H) 03/01/2020 1050   HDL 62 03/01/2020 1050   LDLCALC 118 (H) 03/01/2020 1050 .   Marland Kitchen ASCVD risk enhancing conditions: age >88, CAD, pulmonary fibrosis, chronic pain . Unable to independently manage HLD . Does not contact provider office for questions/concerns  RN Care Manager Clinical Goal(s):  Marland Kitchen Over the next 120 days, patient will work with Consulting civil engineer, providers, and care team towards execution of optimized self-health management plan . Over the next 120 days, patient will verbalize understanding of plan for HLD management  . Over the next 120 days, patient will attend all scheduled medical appointments: 08-31-2020  Interventions: Marland Kitchen Medication review performed; medication list updated in electronic medical record.  Bertram Savin care team collaboration (see longitudinal plan of care) . Referred to pharmacy team for assistance with HLD medication management . Evaluation of current treatment plan related to HLD and patient's adherence to plan as established by provider. . Advised patient to call the office for changes in condition or questions . Provided education to patient re: staying hydrated and eating a balanced meal . Reviewed medications with patient and discussed compliance  . Discussed plans with patient for ongoing care management follow up and provided patient with direct contact information for care management  team . Reviewed scheduled/upcoming provider appointments including: 08-31-2020 at 11 am. The patient has several upcoming appointments at this time due to changes in pulmonary status   Patient Goals/Self-Care Activities: . Over the next 120 days, patient will:   - call for medicine refill 2 or 3 days before it runs out - call if I am sick and can't take my medicine - keep a list of all the medicines I take; vitamins and herbals too - learn to read medicine labels - use a pillbox to sort medicine - use an alarm clock or phone to remind me to take my medicine - drink 6 to 8 glasses of water each day - eat 3 to 5 servings of fruits and vegetables each day - eat 5 or 6 small meals each day - limit fast food meals to no more than 1 per week - manage portion size - prepare main meal at home 3 to 5 days each week - read food labels for fat, fiber, carbohydrates and portion size - set a realistic goal - be open to making changes - I can manage, know and watch for signs of a heart attack - if I have chest pain, call for help - learn about small changes that will make a big difference - learn my personal risk factors - barriers to meeting goals identified - choices provided - collaboration with team encouraged - decision-making supported - health risks reviewed - problem-solving facilitated - questions answered - reassurance provided - resources needed to meet goals identified - self-reliance encouraged -  verbalization of feelings encouraged   Follow Up Plan: Telephone follow up appointment with care management team member scheduled for: 07-19-2020 at 0900 am     Task: RNCM: HLD-Mutually Develop and Royce Macadamia Achievement of Patient Goals   Note:   Care Management Activities:    - barriers to meeting goals identified - choices provided - collaboration with team encouraged - decision-making supported - health risks reviewed - problem-solving facilitated - questions answered -  reassurance provided - resources needed to meet goals identified - self-reliance encouraged - verbalization of feelings encouraged       Patient Care Plan: RNCM: Pulmonary Fibrosis/Shortness of Breath Plan of Care    Problem Identified: RNCM: Pulmonary Fibrosis and Shortness of Breath   Priority: Medium    Goal: RNCM: Pulmonary Fibrosis and Shortness of breath Disease Progression Minimized or Managed   Priority: Medium  Note:   Current Barriers:  Marland Kitchen Knowledge deficits related to basic understanding of Pulmonary Fibrosis and shortness of Breath disease process . Knowledge deficits related to basic Pulmonary Fibrosis self care/management . Knowledge deficit related to importance of energy conservation . Unable to independently manage Pulmonary Fibrosis as evidence of being oxygen dependent and worsening shortness of breath. . Does not contact provider office for questions/concerns  Case Manager Clinical Goal(s):  Over the next 120 days, patient will be able to verbalize understanding of Pulmonary Fibrosis action plan and when to seek appropriate levels of medical care  Over the next 120 days, patient will engage in lite exercise as tolerated to build/regain stamina and strength and reduce shortness of breath through activity tolerance  Over the next 120 days, patient will verbalize basic understanding of Pulmonary Fibrosis disease process and self care activities  Over the next 120 days, patient will not be hospitalized for Pulmonary fibrosis exacerbation  Interventions:   UNABLE to independently: manage pulmonary fibrosis   Provided patient with basic written and verbal pulmonary fibrosis  education on self care/management/and exacerbation prevention   Provided patient with Pulmonary fibrosis  action plan and reinforced importance of daily self assessment  Provided written and verbal instructions on pursed lip breathing and utilized returned demonstration as teach back  Provided  instruction about proper use of medications used for management of pulmonary fibrosis including inhalers  Advised patient to self assesses pulmonary fibrosis action plan zone and make appointment with provider if in the yellow zone for 48 hours without improvement.  Provided patient with education about the role of exercise in the management of Pulmonary fibrosis   Provided education about and advised patient to utilize infection prevention strategies to reduce risk of respiratory infection  Patient Goals/Self-Care Activities:  . - activity or exercise based on tolerance encouraged . - barriers to treatment managed . - breathing techniques encouraged . - communicable disease prevention promoted . - medication-adherence assessment completed . - quality of sleep assessed . - rescue (action) plan reviewed . - screen for functional limitations completed and reviewed . - self-awareness of symptom triggers encouraged Follow Up Plan: Telephone follow up appointment with care management team member scheduled for:  07-19-2020 at 0900 am   Task: RNCM: Alleviate Barriers to Pulmonary Fiborosis  Management   Note:   Care Management Activities:    - activity or exercise based on tolerance encouraged - barriers to treatment managed - breathing techniques encouraged - communicable disease prevention promoted - medication-adherence assessment completed - quality of sleep assessed - rescue (action) plan reviewed - screen for functional limitations completed and reviewed -  self-awareness of symptom triggers encouraged         The patient verbalized understanding of instructions, educational materials, and care plan provided today and declined offer to receive copy of patient instructions, educational materials, and care plan.   Telephone follow up appointment with care management team member scheduled for: 07-19-2020 at 38 am  Noreene Larsson RN, MSN, [facility] Mobile: [phone]   Pursed Lip Breathing Pursed lip breathing is a technique to relieve the feeling of being short of breath. Some long-term respiratory conditions, like chronic obstructive pulmonary disease (COPD) and severe asthma, can make it hard to breathe out (exhale) all of the air in your lungs. This can make air that has less oxygen than normal build up in your lungs (air trapping). Trapped air means your lungs fill with less fresh air when you breathe in (inhale). As a result, you feel short of breath. Pursed lip breathing keeps your airways open longer when you exhale and empties more air from your lungs. This makes more space for fresh air when you inhale. Pursed lip breathing can also slow down your breathing and help your body not have to work so hard to breathe. Over time, pursed lip breathing may help you be able to be more physically active and do more activities. How to perform pursed lip breathing Being short of breath can make you tense and anxious. Before you start this breathing exercise, take a minute to relax your shoulders and close your eyes. Then: 1. Start the exercise by closing your mouth. 2. Breathe in through your nose, taking a normal breath. You can do this at your normal rate of breathing. If you feel you are not getting enough air, breathe in while slowly counting to 2 or 3. 3. Pucker (purse) your lips as if you were going to whistle. 4. Gently tighten your abdomen muscles or press on your belly to help push the air out. 5. Breathe out slowly through your pursed lips. Take at least twice as long to breathe out as it takes you to breathe in. 6. Make sure that you breathe out all of the air, but do not force air out. 7. Repeat the exercise until your breathing improves. Ask your health care provider how often and how long to do this exercise. Follow these instructions at home:  Take over-the-counter and prescription  medicines only as told by your health care provider.  Return to your normal activities as told by your health care provider. Ask your health care provider what activities are safe for you.  Do not use any products that contain nicotine or tobacco, such as cigarettes and e-cigarettes. If you need help quitting, ask your health care provider.  Keep all follow-up visits as told by your health care provider. This is important. Contact a health care provider if:  Your shortness of breath gets worse.  You become less able to exercise or be active.  You develop a cough.  You develop a fever. Get help right away if:  You are struggling to breathe.  Your shortness of breath prevents you from engaging in any activity. Summary  Pursed lip breathing is a breathing technique that helps to remove trapped air from your lungs. It helps you get more oxygen into your lungs and makes your body have to work less hard to breathe.  Pursed lip breathing can gradually make you more able to be physically active.  You can do pursed lip breathing on your own at home.  Ask your health care provider how often and how long you should do pursed lip breathing. This information is not intended to replace advice given to you by your health care provider. Make sure you discuss any questions you have with your health care provider. Document Revised: 04/19/2017 Document Reviewed: 03/29/2016 Elsevier Patient Education  2020 [place].  Pulmonary Fibrosis  Pulmonary fibrosis is a type of lung disease that causes scarring. Over time, the scar tissue builds up in the air sacs of your lungs (alveoli). This makes it hard for you to breathe. Less oxygen can get into your blood. Scarring from pulmonary fibrosis gets worse over time. This damage is permanent and may lead to other serious health problems. What are the causes? There are many different causes of pulmonary fibrosis. Sometimes the cause is not known. This  is called idiopathic pulmonary fibrosis. Other causes include:  Exposure to chemicals and substances found in agricultural, farm, Architect, or factory work. These include mold, asbestos, silica, metal dusts, and toxic fumes.  Sarcoidosis. In this disease, areas of inflammatory cells (granulomas) form and most often affect the lungs.  Autoimmune diseases. These include diseases such as rheumatoid arthritis, systemic sclerosis, or connective tissue disease.  Taking certain medicines. These include drugs used in radiation therapy or used to treat seizures, heart problems, and some infections. What increases the risk? You are more likely to develop this condition if:  You have a family history of the disease.  You are older. The condition is more common in older adults.  You have a history of smoking.  You have a job that exposes you to certain chemicals.  You have gastroesophageal reflux disease (GERD). What are the signs or symptoms? Symptoms of this condition include:  Difficulty breathing that gets worse with activity.  Shortness of breath (dyspnea).  Dry, hacking cough.  Rapid, shallow breathing during exercise or while at rest.  Bluish skin and lips.  Loss of appetite.  Weakness.  Weight loss and fatigue.  Rounded and enlarged fingertips (clubbing). How is this diagnosed? This condition may be diagnosed based on:  Your symptoms and medical history.  A physical exam. You may also have tests, including:  A test that involves looking inside your lungs with an instrument (bronchoscopy).  Imaging studies of your lungs and heart.  Tests to measure how well you are breathing (pulmonary function tests).  Blood tests.  Tests to see how well your lungs work while you are walking (pulmonary stress test).  A procedure to remove a lung tissue sample to look at it under a microscope (biopsy). How is this treated? There is no cure for pulmonary fibrosis. Treatment  focuses on managing symptoms and preventing scarring from getting worse. This may include:  Medicines, such as: ? Steroids to prevent permanent lung changes. ? Medicines to suppress your body's defense system (immune system). ? Medicines to help with lung function by reducing inflammation or scarring.  Ongoing monitoring with X-rays and lab work.  Oxygen therapy.  Pulmonary rehabilitation.  Surgery. In some cases, a lung transplant is possible. Follow these instructions at home:     Medicines  Take over-the-counter and prescription medicines only as told by your health care provider.  Keep your vaccinations up to date as recommended by your health care provider. General instructions  Do not use any products that contain nicotine or tobacco, such as cigarettes and e-cigarettes. If you need help quitting,  ask your health care provider.  Get regular exercise, but do not overexert yourself. Ask your health care provider to suggest some activities that are safe for you to do. ? If you have physical limitations, you may get exercise by walking, using a stationary bike, or doing chair exercises. ? Ask your health care provider about using oxygen while exercising.  If you are exposed to chemicals and substances at work, make sure that you wear a mask or respirator at all times.  Join a pulmonary rehabilitation program or a support group for people with pulmonary fibrosis.  Eat small meals often so you do not get too full. Overeating can make breathing trouble worse.  Maintain a healthy weight. Lose weight if you need to.  Do breathing exercises as directed by your health care provider.  Keep all follow-up visits as told by your health care provider. This is important. Contact a health care provider if you:  Have symptoms that do not get better with medicines.  Are not able to be as active as usual.  Have trouble taking a deep breath.  Have a fever or chills.  Have blue  lips or skin.  Have clubbing of your fingers. Get help right away if you:  Have a sudden worsening of your symptoms.  Have chest pain.  Cough up mucus that is dark in color.  Have a lot of headaches.  Get very confused or sleepy. Summary  Pulmonary fibrosis is a type of lung disease that causes scar tissue to build up in the air sacs of your lungs (alveoli) over time. Less oxygen can get into your blood. This makes it hard for you to breathe.  Scarring from pulmonary fibrosis gets worse over time. This damage is permanent and may lead to other serious health problems.  You are more likely to develop this condition if you have a family history of the condition or a job that exposes you to certain chemicals.  There is no cure for pulmonary fibrosis. Treatment focuses on managing symptoms and preventing scarring from getting worse. This information is not intended to replace advice given to you by your health care provider. Make sure you discuss any questions you have with your health care provider. Document Revised: 06/12/2017 Document Reviewed: 06/12/2017 Elsevier Patient Education  [place].  Diarrhea, Adult Diarrhea is when you pass loose and watery poop (stool) often. Diarrhea can make you feel weak and cause you to lose water in your body (get dehydrated). Losing water in your body can cause you to:  Feel tired and thirsty.  Have a dry mouth.  Go pee (urinate) less often. Diarrhea often lasts 2-3 days. However, it can last longer if it is a sign of something more serious. It is important to treat your diarrhea as told by your doctor. Follow these instructions at home: Eating and drinking     Follow these instructions as told by your doctor:  Take an ORS (oral rehydration solution). This is a drink that helps you replace fluids and minerals your body lost. It is sold at pharmacies and stores.  Drink plenty of fluids, such as: ? Water. ? Ice chips. ? Diluted  fruit juice. ? Low-calorie sports drinks. ? Milk, if you want.  Avoid drinking fluids that have a lot of sugar or caffeine in them.  Eat bland, easy-to-digest foods in small amounts as you are able. These foods include: ? Bananas. ? Applesauce. ? Rice. ? Low-fat (lean) meats. ? Toast. ?  Crackers.  Avoid alcohol.  Avoid spicy or fatty foods.  Medicines  Take over-the-counter and prescription medicines only as told by your doctor.  If you were prescribed an antibiotic medicine, take it as told by your doctor. Do not stop using the antibiotic even if you start to feel better. General instructions   Wash your hands often using soap and water. If soap and water are not available, use a hand sanitizer. Others in your home should wash their hands as well. Hands should be washed: ? After using the toilet or changing a diaper. ? Before preparing, cooking, or serving food. ? While caring for a sick person. ? While visiting someone in a hospital.  Drink enough fluid to keep your pee (urine) pale yellow.  Rest at home while you get better.  Watch your condition for any changes.  Take a warm bath to help with any burning or pain from having diarrhea.  Keep all follow-up visits as told by your doctor. This is important. Contact a doctor if:  You have a fever.  Your diarrhea gets worse.  You have new symptoms.  You cannot keep fluids down.  You feel light-headed or dizzy.  You have a headache.  You have muscle cramps. Get help right away if:  You have chest pain.  You feel very weak or you pass out (faint).  You have bloody or black poop or poop that looks like tar.  You have very bad pain, cramping, or bloating in your belly (abdomen).  You have trouble breathing or you are breathing very quickly.  Your heart is beating very quickly.  Your skin feels cold and clammy.  You feel confused.  You have signs of losing too much water in your body, such as: ? Dark  pee, very little pee, or no pee. ? Cracked lips. ? Dry mouth. ? Sunken eyes. ? Sleepiness. ? Weakness. Summary  Diarrhea is when you pass loose and watery poop (stool) often.  Diarrhea can make you feel weak and cause you to lose water in your body (get dehydrated).  Take an ORS (oral rehydration solution). This is a drink that is sold at pharmacies and stores.  Eat bland, easy-to-digest foods in small amounts as you are able.  Contact a doctor if your condition gets worse. Get help right away if you have signs that you have lost too much water in your body. This information is not intended to replace advice given to you by your health care provider. Make sure you discuss any questions you have with your health care provider. Document Revised: 10/11/2017 Document Reviewed: 10/11/2017 Elsevier Patient Education  [place].

## 2020-05-25 NOTE — Chronic Care Management (AMB) (Signed)
  Care Management   Note  05/25/2020 Name: DYNASTI KERMAN MRN: [id number] DOB: [birth year]  ARORA COAKLEY is a 75 y.o. year old female who is a primary care patient of Valerie Roys, DO and is actively engaged with the care management team. I reached out to [name] by phone today to assist with scheduling an initial visit with the Pharmacist.   Ms. Kight was given information about Chronic Care Management services today including:  1. CCM service includes personalized support from designated clinical staff supervised by her physician, including individualized plan of care and coordination with other care providers 2. 24/7 contact phone numbers for assistance for urgent and routine care needs. 3. Service will only be billed when office clinical staff spend 20 minutes or more in a month to coordinate care. 4. Only one practitioner may furnish and bill the service in a calendar month. 5. The patient may stop CCM services at any time (effective at the end of the month) by phone call to the office staff. 6. The patient will be responsible for cost sharing (co-pay) of up to 20% of the service fee (after annual deductible is met).  Patient agreed to services and verbal consent obtained.   Follow up plan: Telephone appointment with care management team member scheduled for:06/10/2020  [facility]

## 2020-05-26 DIAGNOSIS — J849 Interstitial pulmonary disease, unspecified: Secondary | ICD-10-CM | POA: Diagnosis not present

## 2020-05-26 DIAGNOSIS — M25551 Pain in right hip: Secondary | ICD-10-CM | POA: Diagnosis not present

## 2020-05-26 DIAGNOSIS — I7300 Raynaud's syndrome without gangrene: Secondary | ICD-10-CM | POA: Diagnosis not present

## 2020-05-26 DIAGNOSIS — M810 Age-related osteoporosis without current pathological fracture: Secondary | ICD-10-CM | POA: Diagnosis not present

## 2020-05-26 DIAGNOSIS — M359 Systemic involvement of connective tissue, unspecified: Secondary | ICD-10-CM | POA: Diagnosis not present

## 2020-05-30 ENCOUNTER — Ambulatory Visit: Payer: Medicare Other | Admitting: Pharmacist

## 2020-05-30 DIAGNOSIS — J8410 Pulmonary fibrosis, unspecified: Secondary | ICD-10-CM

## 2020-05-30 DIAGNOSIS — E782 Mixed hyperlipidemia: Secondary | ICD-10-CM

## 2020-05-30 DIAGNOSIS — M8580 Other specified disorders of bone density and structure, unspecified site: Secondary | ICD-10-CM

## 2020-05-30 NOTE — Chronic Care Management (AMB) (Signed)
Chronic Care Management Pharmacy  Name: Melanie Rollins  MRN: [id number] DOB: 1945/11/09   Chief Complaint/ HPI  Melanie Rollins,  75 y.o. , female presents for her Initial CCM visit with the clinical pharmacist via telephone.  PCP : Valerie Roys, DO Patient Care Team: Valerie Roys, DO as PCP - General (Family Medicine) Emmaline Kluver., MD (Rheumatology) Charlie Pitter, MD as Referring Physician (Pulmonary Disease) Ubaldo Glassing Javier Docker, MD as Consulting Physician (Cardiology) Lovell Sheehan, MD as Consulting Physician (Orthopedic Surgery) Jannet Mantis, MD (Dermatology) Vanita Ingles, RN as Case Manager (General Practice) Vladimir Faster, [facility] (Pharmacist)  Patient's chronic conditions include: Hyperlipidemia, Coronary Artery Disease, GERD, Depression, Anxiety, Osteoporosis, Osteoarthritis and Pulmonary Fibrosis Reynaud's phenomenon  Office Visits: 03/01/20- Dr.Johnson- bloodwork, 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 Juleen China- connective tissue d/o, continue plaquenil, going to cards for Pulm htn eval. 05/05/20- C. Anderson, NP-cards- echo with bubble study r/o PH. Wants clearance for THR, NT-pro-BNP 1559   Subjective: " I think my diarrhea problem is solved for now anyway."  Objective: Allergies  Allergen Reactions   Imuran [Azathioprine] Nausea Only and Other (See Comments)    Cold chills and bruise like welps    Medications: Outpatient Encounter Medications as of 05/30/2020  Medication Sig Note   ACETAMINOPHEN PO Take 1,300 mg by mouth in the morning and at bedtime.    Acetylcysteine (NAC) 600 MG CAPS Take 1 capsule (600 mg total) by mouth 2 (two) times daily. (Patient taking differently: Take 1,200 mg by mouth daily.)    Calcium Citrate-Vitamin D (CALCIUM + D PO) Take 600 mg by mouth in the morning and at bedtime.     Cholecalciferol (VITAMIN D) 2000 units CAPS Take 2,000 Units by mouth daily.     CINNAMON PO Take 1,000 mg by mouth daily.    denosumab (PROLIA) 60 MG/ML SOSY injection Inject into the skin.    hydroxychloroquine (PLAQUENIL) 200 MG tablet Take 2 tablets (400 mg total) by mouth daily. (Patient taking differently: Take 200 mg by mouth daily.)    Multiple Vitamin (MULTIVITAMIN) tablet Take 1 tablet by mouth daily.    mycophenolate (CELLCEPT) 500 MG tablet Take 2 tablets (1,000 mg total) by mouth 2 (two) times daily.    NIFEdipine (PROCARDIA-XL/NIFEDICAL-XL) 30 MG 24 hr tablet Take 30 mg by mouth daily.    Omega-3 Fatty Acids (FISH OIL) 1000 MG CAPS Take 1,000 mg by mouth daily.    OXYGEN 2 L/min by Continuous infusion (non-IV) route daily.    predniSONE (DELTASONE) 2.5 MG tablet Take 7.5 mg by mouth daily with breakfast.    sertraline (ZOLOFT) 100 MG tablet Take 1 tablet (100 mg total) by mouth daily.    traMADol (ULTRAM) 50 MG tablet Take 50 mg by mouth every 6 (six) hours as needed. 09/01/2019: ~couple times weekly   zolpidem (AMBIEN) 10 MG tablet Take 0.5 tablets (5 mg total) by mouth at bedtime. (Patient taking differently: Take 5 mg by mouth at bedtime. Takes very seldom)    acyclovir ointment (ZOVIRAX) 5 % Apply 1 application topically every 3 (three) hours. 09/01/2019: Using PRN cold sores    baclofen (LIORESAL) 10 MG tablet Take 1 tablet (10 mg total) by mouth at bedtime as needed for muscle spasms. 09/01/2019: Using ~couple times weekly   BIOTIN PO Take 5,000 mcg by mouth daily.  (Patient not taking: Reported on 05/30/2020)    mupirocin ointment (BACTROBAN) 2 %  Place 1 application into the nose 2 (two) times daily.    omeprazole (PRILOSEC) 20 MG capsule Take 1 capsule (20 mg total) by mouth daily.    permethrin (ELIMITE) 5 % cream permethrin 5 % topical cream (Patient not taking: Reported on 05/30/2020) 09/01/2019: For rosacea   [DISCONTINUED] predniSONE (DELTASONE) 10 MG tablet Take 10 mg by mouth daily with breakfast.    [DISCONTINUED]  sulfamethoxazole-trimethoprim (BACTRIM DS) 800-160 MG tablet Take 1 tablet by mouth 3 (three) times a week. 11/24/2019: PCP ppx   No facility-administered encounter medications on file as of 05/30/2020.    Wt Readings from Last 3 Encounters:  03/01/20 119 lb 12.8 oz (54.3 kg)  10/15/19 124 lb 12.8 oz (56.6 kg)  10/01/19 120 lb (54.4 kg)    Lab Results  Component Value Date   CREATININE 0.78 03/01/2020   BUN 10 03/01/2020   GFRNONAA 76 03/01/2020   GFRAA 87 03/01/2020   NA 136 03/01/2020   K 3.7 03/01/2020   CALCIUM 9.3 03/01/2020   CO2 22 03/01/2020     Current Diagnosis/Assessment:    Goals Addressed            This Visit's Progress    Pharmacy Care Plan       CARE PLAN ENTRY (see longitudinal plan of care for additional care plan information)  Current Barriers:   Chronic Disease Management support, education, and care coordination needs related to  Hyperlipidemia, Coronary Artery Disease, GERD, Depression, Anxiety, Osteoporosis, Osteoarthritis, and Pulmonary Fibrosis and Reynaud's phenomenon     Hyperlipidemia Lab Results  Component Value Date/Time   LDLCALC 118 (H) 03/01/2020 10:50 AM    Pharmacist Clinical Goal(s): o Over the next 60 days, patient will work with PharmD and providers to achieve LDL goal < 70  Current regimen:  o Fish oil 1054m qd  Interventions: o Reviewed chart for cardiology note.  o Reviewed lab values. o Counseled on therapeutic dose of Fish oil for lipid benefit o Will discuss statin therapy with pcp  Patient self care activities - Over the next 60 days, patient will: o Attend all scheduled appointments Osteopenia  Pharmacist Clinical Goal(s) o Over the next 60 days, patient will work with PharmD and providers to optimize therapy  Current regimen:   Prolia 60 mg q 6 months  Calcium/vitamin D bid  Interventions: o Reviewed DEXA results o Provided diet and exercise counseling.  Patient self care activities - Over the  next 60 days, patient will: o Continue medication as prescribed.  o Schedule follow up DEXA after June 10  Medication management  Pharmacist Clinical Goal(s): o Over the next 60 days, patient will work with PharmD and providers to achieve optimal medication adherence  [facility]  Interventions o Comprehensive medication review performed. o Continue current medication management strategy  Patient self care activities - Over the next 60 days, patient will: o Focus on medication adherence by fill dates o Take medications as prescribed o Report any questions or concerns to PharmD and/or provider(s)  Initial goal documentation           Hyperlipidemia/CAD   LDL goal < 70  Last lipids Lab Results  Component Value Date   CHOL 213 (H) 03/01/2020   HDL 62 03/01/2020   LDLCALC 118 (H) 03/01/2020   TRIG 193 (H) 03/01/2020   Hepatic Function Latest Ref Rng & Units 03/01/2020 09/10/2019 08/24/2019  Total Protein 6.0 - 8.5 g/dL 6.7 8.0 6.6  Albumin 3.7 - 4.7  g/dL 4.2 4.5 4.4  AST 0 - 40 IU/L _0 ALT 0 - 32 IU/L _1 Alk Phosphatase 44 - 121 IU/L 61 58 61  Total Bilirubin 0.0 - 1.2 mg/dL 0.2 0.5 <0.2     The 10-year ASCVD risk score Mikey Bussing DC Jr., et al., 2013) is: 13.1%   Values used to calculate the score:     Age: 52 years     Sex: Female     Is Non-Hispanic African American: No     Diabetic: No     Tobacco smoker: No     Systolic Blood Pressure: 161 mmHg     Is BP treated: No     HDL Cholesterol: 62 mg/dL     Total Cholesterol: 213 mg/dL   Patient has failed these meds in past: unknown Patient is currently query controlled on the following medications:   Fish oil 1000 mg qd  We discussed:  Diet and exercise. Therapeutic dose of fish oil is 4 gm daily to see benefit for lipids.Patient is currently taking for dry eyes. Consider increasing to 4 gm dose.  Plan  Continue control with diet and exercise. Consider statin therapy in  patient with CAD and increased LDL.   Osteopenia / Osteoporosis   Last DEXA Scan: 10/29/2018  T-Score femoral neck: -2.1   T-Score forearm radius: -2.0    No results found for: VD25OH   Patient is on pharmacologic therapy  Patient has failed these meds in past: Fosamax Patient is currently controlled on the following medications:   Prolia 60 mg q 6 months  Calcium/vitamin D bid  We discussed:  Recommend [phone] units of vitamin D daily. Recommend 1200 mg of calcium daily from dietary and supplemental sources. Recommend weight-bearing and muscle strengthening exercises for building and maintaining bone density.  Plan  Continue current medications. Consider vitamin d level with next labs. Recommend follow up DEXA  Pulmonary Fibrosis/undifferentiated ct disorder   Patient has failed these meds in past: Imuran-skin rash, nausea Patient is currently controlled on the following medications:   Acetylcysteine 600 mg bid  Plaquenil 400 mg qd  Mycophenolate 1000 mg bid  Prednisone 7.5 mg qd  We discussed:  FANA  Positive, rheumatoid factor positive, ENA negative. Patient being worked up for pulmonary hypertension. Her main concern prior to appointment was persistent diarrhea which had worsened over the last couple months. Previously taking Bactrim prophylaxis three times weekly. Dr. Jefm Bryant suggested this as the cause and patient has not experienced any diarrhea since discontinuing 05/26/20. Follows with pulmonology, cardiology and rheumatology.Chest CT 05/05/20 at [facility] showed no significant progression of PF since 2018. Enlarged pulm. Artery and dilated right ventricle   Plan  Continue current medications  Raynaud's   Patient has failed these meds in past:NA Patient is currently controlled on the following medications:   Nifedipine xl 30 mg qd  We discussed:  Barium swallow with no scleroderma esophagus. FANA  Positive, rheumatoid factor positive, ENA  negative.  Plan  Continue current medications  Vaccines   Reviewed and discussed patient's vaccination history.    Immunization History  Administered Date(s) Administered   Influenza, High Dose Seasonal PF 01/19/2018   Influenza-Unspecified 02/17/2015, 03/09/2016, 02/07/2017, 02/08/2019   PFIZER(Purple Top)SARS-COV-2 Vaccination 06/30/2019, 07/21/2019   Pneumococcal Conjugate-13 07/02/2014   Pneumococcal Polysaccharide-23 07/04/2015   Td 03/16/2009   Zoster 07/06/2016   Zoster Recombinat (Shingrix) 07/11/2016, 10/29/2016     Will discuss GERD and depression at follow-up.  Medication  Management   Patient's preferred pharmacy is:  [facility], [place] [address] Phone: [phone] Fax: [phone]  [facility] - [address] [place], [place] [address] Phone: [phone] Fax: [phone]  Uses pill box? Yes Pt endorses 90% compliance   Plan  Continue current medication management strategy    Follow up: 2 month phone visit  Junita Push. Kenton Kingfisher PharmD, [facility] [phone]

## 2020-06-09 NOTE — Patient Instructions (Addendum)
Visit Information  It was a pleasure speaking with you today. Thank you for letting me be part of your clinical team. Please call with any questions or concerns.   Goals Addressed            This Visit's Progress   . Pharmacy Care Plan       CARE PLAN ENTRY (see longitudinal plan of care for additional care plan information)  Current Barriers:  . Chronic Disease Management support, education, and care coordination needs related to  Hyperlipidemia, Coronary Artery Disease, GERD, Depression, Anxiety, Osteoporosis, Osteoarthritis, and Pulmonary Fibrosis and Reynaud's phenomenon     Hyperlipidemia Lab Results  Component Value Date/Time   LDLCALC 118 (H) 03/01/2020 10:50 AM   . Pharmacist Clinical Goal(s): o Over the next 60 days, patient will work with PharmD and providers to achieve LDL goal < 70 . Current regimen:  o Fish oil 1069m qd . Interventions: o Reviewed chart for cardiology note.  o Reviewed lab values. o Counseled on therapeutic dose of Fish oil for lipid benefit o Will discuss statin therapy with pcp . Patient self care activities - Over the next 60 days, patient will: o Attend all scheduled appointments Osteopenia . Pharmacist Clinical Goal(s) o Over the next 60 days, patient will work with PharmD and providers to optimize therapy . Current regimen:  . Prolia 60 mg q 6 months . Calcium/vitamin D bid . Interventions: o Reviewed DEXA results o Provided diet and exercise counseling. . Patient self care activities - Over the next 60 days, patient will: o Continue medication as prescribed.  o Schedule follow up DEXA after June 10  Medication management . Pharmacist Clinical Goal(s): o Over the next 60 days, patient will work with PharmD and providers to achieve optimal medication adherence . Current [facility]. Interventions o Comprehensive medication review performed. o Continue current medication management strategy . Patient self care  activities - Over the next 60 days, patient will: o Focus on medication adherence by fill dates o Take medications as prescribed o Report any questions or concerns to PharmD and/or provider(s)  Initial goal documentation        The patient verbalized understanding of instructions, educational materials, and care plan provided today and agreed to receive a mailed copy of patient instructions, educational materials, and care plan.   Telephone follow up appointment with pharmacy team member scheduled for: 2 months  JJunita Push HKenton KingfisherPharmD, BCPS Clinical Pharmacist [phone] Osteopenia  Osteopenia is a loss of thickness (density) inside the bones. Another name for osteopenia is low bone mass. Mild osteopenia is a normal part of aging. It is not a disease, and it does not cause symptoms. However, if you have osteopenia and continue to lose bone mass, you could develop a condition that causes the bones to become thin and break more easily (osteoporosis). Osteoporosis can cause you to lose some height, have back pain, and have a stooped posture. Although osteopenia is not a disease, making changes to your lifestyle and diet can help to prevent osteopenia from developing into osteoporosis. What are the causes? Osteopenia is caused by loss of calcium in the bones. Bones are constantly changing. Old bone cells are continually being replaced with new bone cells. This process builds new bone. The mineral calcium is needed to build new bone and maintain bone density. Bone density is usually highest around age 74 After that, most people's bodies cannot replace all the bone they have lost with new  bone. What increases the risk? You are more likely to develop this condition if:  You are older than age 17.  You are a woman who went through menopause early.  You have a long illness that keeps you in bed.  You do not get enough exercise.  You lack certain nutrients (malnutrition).  You have  an overactive thyroid gland (hyperthyroidism).  You use products that contain nicotine or tobacco, such as cigarettes, e-cigarettes and chewing tobacco, or you drink a lot of alcohol.  You are taking medicines that weaken the bones, such as steroids. What are the signs or symptoms? This condition does not cause any symptoms. You may have a slightly higher risk for bone breaks (fractures), so getting fractures more easily than normal may be an indication of osteopenia. How is this diagnosed? This condition may be diagnosed based on an X-ray exam that measures bone density (dual-energy X-ray absorptiometry, or DEXA). This test can measure bone density in your hips, spine, and wrists. Osteopenia has no symptoms, so this condition is usually diagnosed after a routine bone density screening test is done for osteoporosis. This routine screening is usually done for:  Women who are age 55 or older.  Men who are age 93 or older. If you have risk factors for osteopenia, you may have the screening test at an earlier age. How is this treated? Making dietary and lifestyle changes can lower your risk for osteoporosis. If you have severe osteopenia that is close to becoming osteoporosis, this condition can be treated with medicines and dietary supplements such as calcium and vitamin D. These supplements help to rebuild bone density. Follow these instructions at home: Eating and drinking Eat a diet that is high in calcium and vitamin D.  Calcium is found in dairy products, beans, salmon, and leafy green vegetables like spinach and broccoli.  Look for foods that have vitamin D and calcium added to them (fortified foods), such as orange juice, cereal, and bread.   Lifestyle  Do 30 minutes or more of a weight-bearing exercise every day, such as walking, jogging, or playing a sport. These types of exercises strengthen the bones.  Do not use any products that contain nicotine or tobacco, such as cigarettes,  e-cigarettes, and chewing tobacco. If you need help quitting, ask your health care provider.  Do not drink alcohol if: ? Your health care provider tells you not to drink. ? You are pregnant, may be pregnant, or are planning to become pregnant.  If you drink alcohol: ? Limit how much you use to:  0-1 drink a day for women.  0-2 drinks a day for men. ? Be aware of how much alcohol is in your drink. In the [place] one drink equals one 12 oz bottle of beer (355 mL), one 5 oz glass of wine (148 mL), or one 1 oz glass of hard liquor (44 mL). General instructions  Take over-the-counter and prescription medicines only as told by your health care provider. These include vitamins and supplements.  Take precautions at home to lower your risk of falling, such as: ? Keeping rooms well-lit and free of clutter, such as cords. ? Installing safety rails on stairs. ? Using rubber mats in the bathroom or other areas that are often wet or slippery.  Keep all follow-up visits. This is important. Contact a health care provider if:  You have not had a bone density screening for osteoporosis and you are: ? A woman who is age 76 or older. ?  A man who is age 26 or older.  You are a postmenopausal woman who has not had a bone density screening for osteoporosis.  You are older than age 71 and you want to know if you should have bone density screening for osteoporosis. Summary  Osteopenia is a loss of thickness (density) inside the bones. Another name for osteopenia is low bone mass.  Osteopenia is not a disease, but it may increase your risk for a condition that causes the bones to become thin and break more easily (osteoporosis).  You may be at risk for osteopenia if you are older than age 22 or if you are a woman who went through early menopause.  Osteopenia does not cause any symptoms, but it can be diagnosed with a bone density screening test.  Dietary and lifestyle changes are the first treatment  for osteopenia. These may lower your risk for osteoporosis. This information is not intended to replace advice given to you by your health care provider. Make sure you discuss any questions you have with your health care provider. Document Revised: 10/22/2019 Document Reviewed: 10/22/2019 Elsevier Patient Education  [place].

## 2020-06-10 ENCOUNTER — Telehealth: Payer: Medicare Other

## 2020-06-28 DIAGNOSIS — I517 Cardiomegaly: Secondary | ICD-10-CM | POA: Diagnosis not present

## 2020-06-28 DIAGNOSIS — J849 Interstitial pulmonary disease, unspecified: Secondary | ICD-10-CM | POA: Diagnosis not present

## 2020-06-28 DIAGNOSIS — R9431 Abnormal electrocardiogram [ECG] [EKG]: Secondary | ICD-10-CM | POA: Diagnosis not present

## 2020-06-28 DIAGNOSIS — I270 Primary pulmonary hypertension: Secondary | ICD-10-CM | POA: Diagnosis not present

## 2020-06-28 DIAGNOSIS — Z79899 Other long term (current) drug therapy: Secondary | ICD-10-CM | POA: Diagnosis not present

## 2020-06-28 DIAGNOSIS — J984 Other disorders of lung: Secondary | ICD-10-CM | POA: Diagnosis not present

## 2020-06-28 DIAGNOSIS — J9611 Chronic respiratory failure with hypoxia: Secondary | ICD-10-CM | POA: Diagnosis not present

## 2020-06-28 DIAGNOSIS — M359 Systemic involvement of connective tissue, unspecified: Secondary | ICD-10-CM | POA: Diagnosis not present

## 2020-06-28 DIAGNOSIS — I2721 Secondary pulmonary arterial hypertension: Secondary | ICD-10-CM | POA: Diagnosis not present

## 2020-06-28 DIAGNOSIS — R0602 Shortness of breath: Secondary | ICD-10-CM | POA: Diagnosis not present

## 2020-06-30 DIAGNOSIS — J8410 Pulmonary fibrosis, unspecified: Secondary | ICD-10-CM | POA: Diagnosis not present

## 2020-06-30 DIAGNOSIS — I7300 Raynaud's syndrome without gangrene: Secondary | ICD-10-CM | POA: Diagnosis not present

## 2020-06-30 DIAGNOSIS — I2721 Secondary pulmonary arterial hypertension: Secondary | ICD-10-CM | POA: Diagnosis not present

## 2020-07-08 DIAGNOSIS — M1611 Unilateral primary osteoarthritis, right hip: Secondary | ICD-10-CM | POA: Insufficient documentation

## 2020-07-13 DIAGNOSIS — M25551 Pain in right hip: Secondary | ICD-10-CM | POA: Diagnosis not present

## 2020-07-19 ENCOUNTER — Ambulatory Visit (INDEPENDENT_AMBULATORY_CARE_PROVIDER_SITE_OTHER): Payer: Medicare Other | Admitting: General Practice

## 2020-07-19 ENCOUNTER — Telehealth: Payer: Medicare Other | Admitting: General Practice

## 2020-07-19 DIAGNOSIS — F32A Depression, unspecified: Secondary | ICD-10-CM | POA: Diagnosis not present

## 2020-07-19 DIAGNOSIS — F419 Anxiety disorder, unspecified: Secondary | ICD-10-CM

## 2020-07-19 DIAGNOSIS — R0602 Shortness of breath: Secondary | ICD-10-CM

## 2020-07-19 DIAGNOSIS — M1990 Unspecified osteoarthritis, unspecified site: Secondary | ICD-10-CM

## 2020-07-19 DIAGNOSIS — M25511 Pain in right shoulder: Secondary | ICD-10-CM

## 2020-07-19 DIAGNOSIS — E782 Mixed hyperlipidemia: Secondary | ICD-10-CM | POA: Diagnosis not present

## 2020-07-19 DIAGNOSIS — I2510 Atherosclerotic heart disease of native coronary artery without angina pectoris: Secondary | ICD-10-CM

## 2020-07-19 DIAGNOSIS — J8410 Pulmonary fibrosis, unspecified: Secondary | ICD-10-CM

## 2020-07-19 DIAGNOSIS — M25559 Pain in unspecified hip: Secondary | ICD-10-CM

## 2020-07-19 NOTE — Patient Instructions (Signed)
Visit Information  PATIENT GOALS: Patient Care Plan: RNCM: Coronary Artery Disease (Adult)    Problem Identified: RNCM: Disease Progression (Coronary Artery Disease)   Priority: Medium    Goal: RNCM: Disease Progression Prevented or Minimized   Priority: Medium  Note:   Current Barriers:  Melanie Rollins Knowledge Deficits related to management of CAD . Chronic Disease Management support and education needs related to CAD and subsequent underlying chronic conditions  . Unable to independently manage CAD . Does not contact provider office for questions/concerns  Nurse Case Manager Clinical Goal(s):  Melanie Rollins Over the next 120 days, patient will verbalize understanding of plan for CAD . Over the next 120 days, patient will work with [facility] and CCM team to address needs related to CAD . Over the next 120 days, patient will attend all scheduled medical appointments: 08-31-2020 at 11 am  Interventions:  . 1:1 collaboration with Valerie Roys, DO regarding development and update of comprehensive plan of care as evidenced by provider attestation and co-signature . Inter-disciplinary care team collaboration (see longitudinal plan of care) . Evaluation of current treatment plan related to CAD and patient's adherence to plan as established by provider. . Advised patient to call the office for changes in condition or new questions or concerns  . Provided education to patient re: heart healthy diet and maintaining healthy heart practices. 07-19-2020: the patient is compliant with heart healthy diet. She is having multiple issues right now but she is maintaining her health as best as possible  . Reviewed medications with patient and discussed compliance. 07-19-2020: The patient endorses compliance with medications regimen.  . Discussed plans with patient for ongoing care management follow up and provided patient with direct contact information for care management team . Reviewed scheduled/upcoming provider appointments  including: 08-31-2020 at 11 am  Patient Goals/Self-Care Activities Over the next 120 days, patient will:  - Patient will self administer medications as prescribed Patient will attend all scheduled provider appointments Patient will call pharmacy for medication refills Patient will call provider office for new concerns or questions Patient will work with BSW to address care coordination needs and will continue to work with the clinical team to address health care and disease management related needs.   - barriers to treatment adherence reviewed and addressed - communicable disease prevention promoted - difficulty of making life-long changes acknowledged - functional limitation screening reviewed - healthy lifestyle promoted - medication-adherence assessment completed - medication side effects managed - self-awareness of signs/symptoms of worsening disease encouraged  Follow Up Plan: Telephone follow up appointment with care management team member scheduled for: 09-13-2020 at 0900 am       Task: Alleviate Barriers to Coronary Artery Disease Therapy   Note:   Care Management Activities:    - barriers to treatment adherence reviewed and addressed - communicable disease prevention promoted - difficulty of making life-long changes acknowledged - functional limitation screening reviewed - healthy lifestyle promoted - medication-adherence assessment completed - medication side effects managed - self-awareness of signs/symptoms of worsening disease encouraged       Patient Care Plan: RNCM: Chronic Pain (Adult)- right hip pain    Problem Identified: RNCM: Pain Management Plan (Chronic Pain)- right hip   Priority: Medium    Goal: RNCM: Pain Management Plan Developed- Right Hip Pain   Priority: Medium  Note:   Current Barriers:  Melanie Rollins Knowledge Deficits related to managing acute/chronic pain . Non-adherence to scheduled provider appointments . Non-adherence to prescribed medication  regimen . Difficulty obtaining medications .  Chronic Disease Management support and education needs related to chronic pain . Unable to independently manage chronic right hip pain . Does not contact provider office for questions/concerns  Nurse Case Manager Clinical Goal(s):  Melanie Rollins Over the next 120 days, patient will verbalize understanding of plan for managing pain . Over the next 120 days, patient will meet with RN Care Manager to address chronic pain and discomfort  . Over the next 120 days, patient will attend all scheduled medical appointments: 08-31-2020 . Over the next  120 days patient will demonstrate use of different relaxation  skills and/or diversional activities to assist with pain reduction (distraction, imagery, relaxation, massage, acupressure, TENS, heat, and cold application . Over the next  120  days patient will report pain at a level less than 3 to 4 on a 10-10 rating scale . Over the next   120 days patient will use pharmacological and nonpharmacological pain relief strategies . Over the next 120  days patient will verbalize acceptable level of pain relief and ability to engage in desired activities . Over the next  120 days patient will engage in desired activities without an increase in pain level  Interventions:  . - deep breathing, relaxation and mindfulness use promoted . - effectiveness of pharmacologic therapy monitored.  07-19-2020: The patient had an injection last week in her hip because she cannot have surgery. The patient states that it has helped her hip- follows up next week with the provider for further evaluation and treatment options.  . - pain assessed- patient denies pain in her hip today but is having issues with shoulder pain now. She states she had a couple of good days where she felt really good and was cleaning out a closet and she feels this caused her to pull something in her shoulder. She is going to discuss with the provider next week when she has follow  up about her hip.  . - pain treatment goals reviewed- 07-19-2020: The patient wants to have relief from the pain she is experiencing. Working with the provider to optimize her ability to function and do ADLs/IADLS to the best of her ability  . Evaluation of current treatment plan related to pain in right hip and patient's adherence to plan as established by provider. 07-19-2020: The patient reports the shot she got was very helpful for relief of the hip pain that she had. Will discuss new shoulder pain she is experiencing at her follow up visit.  . Advised patient to call the provider for worsening pain and discomfort that is unresolved by current pain management regimen . Provided education to patient re: safety and use of DME as needed  . Reviewed medications with patient and discussed compliance. The patient is using Tylenol and Ultram to manage pain and discomfort  . Collaborated with CCM team regarding effective management of pain and review of medications to treat pain. The patient also working with the specialist  . Discussed plans with patient for ongoing care management follow up and provided patient with direct contact information for care management team . Allow patient to maintain a diary of pain ratings, timing, precipitating events, medications, treatments, and what works best to relieve pain,  . Refer to support groups and self-help groups . Educate patient about the use of pharmacological interventions for pain management- antianxiety, antidepressants, NSAIDS, opioid analgesics,  . Explain the importance of lifestyle modifications to effective pain management   Patient Goals/Self Care Activities:  . Patient verbalizes understanding of plan  to manage pain and discomfort . Self-administers medications as prescribed . Attends all scheduled provider appointments . Calls pharmacy for medication refills . Calls provider office for new concerns or questions . - mutually acceptable comfort  goal set . - pain assessed . - pain management plan developed . - pain treatment goals reviewed . - patient response to treatment assessed . - sharing of pain management plan with teachers and other caregivers encouraged  Follow Up Plan: Telephone follow up appointment with care management team member scheduled for: 09-13-2020 at 0900 am     Task: RNCM: Partner to Develop Chronic Pain Management Plan   Note:   Care Management Activities:    - mutually acceptable comfort goal set - pain assessed - pain management plan developed - pain treatment goals reviewed - patient response to treatment assessed - sharing of pain management plan with teachers and other caregivers encouraged      Patient Care Plan: RNCM: Depression (Adult)    Problem Identified: RNCM: Depression Identification (Depression)   Priority: Medium    Goal: RNCM: Axniety and Depressive Symptoms Identified   Note:   Current Barriers:  Melanie Rollins Knowledge Deficits related to anxiety and depression exacerbated by chronic conditions . Care Coordination needs related to management of anxiety and depression in a patient with multiple chronic conditions and chronic diarrhea over the last several months getting worse . Chronic Disease Management support and education needs related to anxiety and depression in patient with chronic conditions and chronic diarrhea over the last several months.  . Unable to independently manage anxiety and depression as evidence of chronic conditions that exacerbate conditions . Lacks social connections . Does not contact provider office for questions/concerns  Nurse Case Manager Clinical Goal(s):  Melanie Rollins Over the next 120 days, patient will verbalize understanding of plan for management of anxiety and depression and chronic diarrhea  . Over the next 120 days, patient will work with [facility], [place] team, and pcp to address needs related to anxiety and depression caused by chronic diarrhea and other chronic  conditions . Over the next 120 days, patient will demonstrate a decrease in chronic diarrhea  exacerbations as evidenced by decreased episodes of diarrhea, depression and anxiety. . Over the next 120 days, patient will attend all scheduled medical appointments: 08-31-2020 . Over the next 120 days, patient will demonstrate improved adherence to prescribed treatment plan for Anxiety and depression  as evidenced bydecreased episodes of diarrhea and stabilized chronic conditions . Over the next 120 days, patient will demonstrate improved health management independence as evidenced byimproved symptoms and ability to effectively manage care  . Over the next 120 days, patient will work with CM team pharmacist to review medications that may be contributing to chronic diarrhea . Over the next 120 days, patient will work with CM clinical social worker to manage depression and anxiety.   Interventions:  . 1:1 collaboration with Valerie Roys, DO regarding development and update of comprehensive plan of care as evidenced by provider attestation and co-signature . Inter-disciplinary care team collaboration (see longitudinal plan of care) . Evaluation of current treatment plan related to Depression and anxiety  and patient's adherence to plan as established by provider. 07-19-2020: The patient is frustrated with all of her health problems at this time but she is doing what she can to remain positive. Denies any acute issues with depression or anxiety.  . Advised patient to call the provider for change or worsening sx and sx of depression, anxiety and chronic  diarrhea . Provided education to patient re: staying hydrated and eating balanced diet. To write down factors that may contribute to exacerbation of diarrhea.  07-19-2020: Denies issues at this time with diarrhea  . Reviewed medications with patient and discussed compliance and possible medications that could contribute to diarrhea. 07-19-2020: The patient has been  followed by pharm D. Denies any concerns related to medications at this time . Collaborated with CCM team pharmacist  regarding medications that could cause diarrhea. 07-19-2020: has been in contact with the pharmacist.  . Discussed plans with patient for ongoing care management follow up and provided patient with direct contact information for care management team . Reviewed scheduled/upcoming provider appointments including: 08-31-2020 at 11 am . Social Work referral for ongoing support for depression and anxiety . Pharmacy referral for review of medications for possible medication that causes diarrhea   Patient Goals/Self-Care Activities Over the next 120 days, patient will:  - Patient will self administer medications as prescribed Patient will attend all scheduled provider appointments Patient will call pharmacy for medication refills Patient will call provider office for new concerns or questions Patient will work with BSW to address care coordination needs and will continue to work with the clinical team to address health care and disease management related needs.    - anxiety screen reviewed - depression screen reviewed - medication list reviewed  Follow Up Plan: Telephone follow up appointment with care management team member scheduled for: 09-13-2020 at 0900 am       Task: RNCM: Anxiety and Depression/Identify Depressive Symptoms and Facilitate Treatment   Note:   Care Management Activities:    - anxiety screen reviewed - depression screen reviewed - medication list reviewed       Patient Care Plan: RNCM: HLD Disease Management    Problem Identified: [place] Promotion or Disease Self-Management (General Plan of Care)   Priority: Medium    Goal: RNCM: HLDSelf-Management Plan Developed   Priority: Medium  Note:   Current Barriers:  . Poorly controlled hyperlipidemia, complicated by CAD, pulmonary fibrosis, possible new diagnosis of Pulmonary HTN . Current  antihyperlipidemic regimen: Omega 3 fatty acids 1000 mg daily . Most recent lipid panel:     Component Value Date/Time   CHOL 213 (H) 03/01/2020 1050   TRIG 193 (H) 03/01/2020 1050   HDL 62 03/01/2020 1050   LDLCALC 118 (H) 03/01/2020 1050 .   Melanie Rollins ASCVD risk enhancing conditions: age >68, CAD, pulmonary fibrosis, chronic pain . Unable to independently manage HLD . Does not contact provider office for questions/concerns  RN Care Manager Clinical Goal(s):  Melanie Rollins Over the next 120 days, patient will work with Consulting civil engineer, providers, and care team towards execution of optimized self-health management plan . Over the next 120 days, patient will verbalize understanding of plan for HLD management  . Over the next 120 days, patient will attend all scheduled medical appointments: 08-31-2020  Interventions: Melanie Rollins Medication review performed; medication list updated in electronic medical record.  Bertram Savin care team collaboration (see longitudinal plan of care) . Referred to pharmacy team for assistance with HLD medication management . Evaluation of current treatment plan related to HLD and patient's adherence to plan as established by provider. . Advised patient to call the office for changes in condition or questions . Provided education to patient re: staying hydrated and eating a balanced meal . Reviewed medications with patient and discussed compliance  . Discussed plans with patient for ongoing care management follow up  and provided patient with direct contact information for care management team . Reviewed scheduled/upcoming provider appointments including: 08-31-2020 at 11 am. The patient has several upcoming appointments at this time due to changes in pulmonary status   Patient Goals/Self-Care Activities: . Over the next 120 days, patient will:   - call for medicine refill 2 or 3 days before it runs out - call if I am sick and can't take my medicine - keep a list of all the  medicines I take; vitamins and herbals too - learn to read medicine labels - use a pillbox to sort medicine - use an alarm clock or phone to remind me to take my medicine - drink 6 to 8 glasses of water each day - eat 3 to 5 servings of fruits and vegetables each day - eat 5 or 6 small meals each day - limit fast food meals to no more than 1 per week - manage portion size - prepare main meal at home 3 to 5 days each week - read food labels for fat, fiber, carbohydrates and portion size - set a realistic goal - be open to making changes - I can manage, know and watch for signs of a heart attack - if I have chest pain, call for help - learn about small changes that will make a big difference - learn my personal risk factors - barriers to meeting goals identified - choices provided - collaboration with team encouraged - decision-making supported - health risks reviewed - problem-solving facilitated - questions answered - reassurance provided - resources needed to meet goals identified - self-reliance encouraged - verbalization of feelings encouraged   Follow Up Plan: Telephone follow up appointment with care management team member scheduled for: 07-19-2020 at 0900 am     Task: RNCM: HLD-Mutually Develop and Royce Macadamia Achievement of Patient Goals   Note:   Care Management Activities:    - barriers to meeting goals identified - choices provided - collaboration with team encouraged - decision-making supported - health risks reviewed - problem-solving facilitated - questions answered - reassurance provided - resources needed to meet goals identified - self-reliance encouraged - verbalization of feelings encouraged       Patient Care Plan: RNCM: Pulmonary Fibrosis/Shortness of Breath Plan of Care    Problem Identified: RNCM: Pulmonary Fibrosis and Shortness of Breath   Priority: Medium    Goal: RNCM: Pulmonary Fibrosis and Shortness of breath Disease Progression Minimized  or Managed   Priority: Medium  Note:   Current Barriers:  Melanie Rollins Knowledge deficits related to basic understanding of Pulmonary Fibrosis and shortness of Breath disease process . Knowledge deficits related to basic Pulmonary Fibrosis self care/management . Knowledge deficit related to importance of energy conservation . Unable to independently manage Pulmonary Fibrosis as evidence of being oxygen dependent and worsening shortness of breath. . Does not contact provider office for questions/concerns  Case Manager Clinical Goal(s):  Over the next 120 days, patient will be able to verbalize understanding of Pulmonary Fibrosis action plan and when to seek appropriate levels of medical care  Over the next 120 days, patient will engage in lite exercise as tolerated to build/regain stamina and strength and reduce shortness of breath through activity tolerance  Over the next 120 days, patient will verbalize basic understanding of Pulmonary Fibrosis disease process and self care activities  Over the next 120 days, patient will not be hospitalized for Pulmonary fibrosis exacerbation  Interventions:   UNABLE to independently: manage pulmonary fibrosis  Provided patient with basic written and verbal pulmonary fibrosis  education on self care/management/and exacerbation prevention   Provided patient with Pulmonary fibrosis  action plan and reinforced importance of daily self assessment  Provided written and verbal instructions on pursed lip breathing and utilized returned demonstration as teach back  Provided instruction about proper use of medications used for management of pulmonary fibrosis including inhalers  Advised patient to self assesses pulmonary fibrosis action plan zone and make appointment with provider if in the yellow zone for 48 hours without improvement.  Provided patient with education about the role of exercise in the management of Pulmonary fibrosis   Provided education about and  advised patient to utilize infection prevention strategies to reduce risk of respiratory infection   Evaluation of recent ECHO and follow up.  The patient states that she is now dealing with pulmonary HTN and they have started her on a new medication: Tyvaso and she has been on that for one week. She will follow up with the providers at [facility] closely. She can not tell of any changes right now as she has not been on the medication long. She is using her oxygen and her breathing is about the same. She is ready for her chronic conditions to balance out. Will continue to monitor for changes.  Patient Goals/Self-Care Activities:  . - activity or exercise based on tolerance encouraged . - barriers to treatment managed . - breathing techniques encouraged . - communicable disease prevention promoted . - medication-adherence assessment completed . - quality of sleep assessed . - rescue (action) plan reviewed . - screen for functional limitations completed and reviewed . - self-awareness of symptom triggers encouraged Follow Up Plan: Telephone follow up appointment with care management team member scheduled for:  09-13-2020 at 0900 am   Task: RNCM: Alleviate Barriers to Pulmonary Fiborosis  Management   Note:   Care Management Activities:    - activity or exercise based on tolerance encouraged - barriers to treatment managed - breathing techniques encouraged - communicable disease prevention promoted - medication-adherence assessment completed - quality of sleep assessed - rescue (action) plan reviewed - screen for functional limitations completed and reviewed - self-awareness of symptom triggers encouraged         Patient verbalizes understanding of instructions provided today and agrees to view in [place].   Telephone follow up appointment with care management team member scheduled for: 09-13-2020 at 0900 am  Noreene Larsson RN, MSN, [facility] Mobile: [phone]

## 2020-07-19 NOTE — Chronic Care Management (AMB) (Signed)
Chronic Care Management   CCM RN Visit Note  07/19/2020 Name: Melanie Rollins MRN: [id number] DOB: 04-05-1946  Subjective: Melanie Rollins is a 75 y.o. year old female who is a primary care patient of Valerie Roys, DO. The care management team was consulted for assistance with disease management and care coordination needs.    Engaged with patient by telephone for follow up visit in response to provider referral for case management and/or care coordination services.   Consent to Services:  The patient was given information about Chronic Care Management services, agreed to services, and gave verbal consent prior to initiation of services.  Please see initial visit note for detailed documentation.   Patient agreed to services and verbal consent obtained.   Assessment: Review of patient past medical history, allergies, medications, health status, including review of consultants reports, laboratory and other test data, was performed as part of comprehensive evaluation and provision of chronic care management services.   SDOH (Social Determinants of Health) assessments and interventions performed:    CCM Care Plan  Allergies  Allergen Reactions  . Imuran [Azathioprine] Nausea Only and Other (See Comments)    Cold chills and bruise like welps    Outpatient Encounter Medications as of 07/19/2020  Medication Sig Note  . ACETAMINOPHEN PO Take 1,300 mg by mouth in the morning and at bedtime.   . Acetylcysteine (NAC) 600 MG CAPS Take 1 capsule (600 mg total) by mouth 2 (two) times daily. (Patient taking differently: Take 1,200 mg by mouth daily.)   . acyclovir ointment (ZOVIRAX) 5 % Apply 1 application topically every 3 (three) hours. 09/01/2019: Using PRN cold sores   . baclofen (LIORESAL) 10 MG tablet Take 1 tablet (10 mg total) by mouth at bedtime as needed for muscle spasms. 09/01/2019: Using ~couple times weekly  . BIOTIN PO Take 5,000 mcg by mouth daily.  (Patient not taking: Reported on  05/30/2020)   . Calcium Citrate-Vitamin D (CALCIUM + D PO) Take 600 mg by mouth in the morning and at bedtime.    . Cholecalciferol (VITAMIN D) 2000 units CAPS Take 2,000 Units by mouth daily.   Marland Kitchen CINNAMON PO Take 1,000 mg by mouth daily.   Marland Kitchen denosumab (PROLIA) 60 MG/ML SOSY injection Inject into the skin.   . hydroxychloroquine (PLAQUENIL) 200 MG tablet Take 2 tablets (400 mg total) by mouth daily. (Patient taking differently: Take 200 mg by mouth daily.)   . Multiple Vitamin (MULTIVITAMIN) tablet Take 1 tablet by mouth daily.   . mupirocin ointment (BACTROBAN) 2 % Place 1 application into the nose 2 (two) times daily.   . mycophenolate (CELLCEPT) 500 MG tablet Take 2 tablets (1,000 mg total) by mouth 2 (two) times daily.   Marland Kitchen NIFEdipine (PROCARDIA-XL/NIFEDICAL-XL) 30 MG 24 hr tablet Take 30 mg by mouth daily.   . Omega-3 Fatty Acids (FISH OIL) 1000 MG CAPS Take 1,000 mg by mouth daily.   Marland Kitchen omeprazole (PRILOSEC) 20 MG capsule Take 1 capsule (20 mg total) by mouth daily.   . OXYGEN 2 L/min by Continuous infusion (non-IV) route daily.   . permethrin (ELIMITE) 5 % cream permethrin 5 % topical cream (Patient not taking: Reported on 05/30/2020) 09/01/2019: For rosacea  . predniSONE (DELTASONE) 2.5 MG tablet Take 7.5 mg by mouth daily with breakfast.   . sertraline (ZOLOFT) 100 MG tablet Take 1 tablet (100 mg total) by mouth daily.   . traMADol (ULTRAM) 50 MG tablet Take 50 mg by mouth every 6 (six)  hours as needed. 09/01/2019: ~couple times weekly  . zolpidem (AMBIEN) 10 MG tablet Take 0.5 tablets (5 mg total) by mouth at bedtime. (Patient taking differently: Take 5 mg by mouth at bedtime. Takes very seldom)    No facility-administered encounter medications on file as of 07/19/2020.    Patient Active Problem List   Diagnosis Date Noted  . Occult blood in stools   . Colonic thickening   . Hemorrhoids   . Abdominal pain   . Gastric polyp   . Iron deficiency anemia due to chronic blood loss 08/24/2019   . Low back pain 06/08/2019  . Chronic constipation 03/21/2018  . GERD without esophagitis 03/21/2018  . Age-related osteoporosis with current pathological fracture 03/17/2018  . Chronic respiratory failure with hypoxia ([place]) 03/17/2018  . 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 Elevated liver enzymes 12/13/2017  . Coronary artery disease involving native coronary artery of native heart without angina pectoris 10/23/2017  . SOB (shortness of breath) 10/23/2017  . Syncope, near 10/10/2017  . Advanced care planning/counseling discussion 07/06/2016  . 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 Pulmonary fibrosis ([place]) 03/14/2015  . Anxiety and depression 03/14/2015  . Hyperlipidemia 03/14/2015  . Insomnia 03/14/2015  . Osteoarthritis 03/14/2015  . Atrophic vaginitis 03/14/2015  . Connective tissue disease ([place]) 01/27/2014    Conditions to be addressed/monitored:CAD, HLD, Anxiety, Depression, Pulmonary Disease and Chronic pain to right hip and acute pain to right shoulder  Care Plan : RNCM: Coronary Artery Disease (Adult)  Updates made by Vanita Ingles since 07/19/2020 12:00 AM    Problem: RNCM: Disease Progression (Coronary Artery Disease)   Priority: Medium    Goal: RNCM: Disease Progression Prevented or Minimized   Priority: Medium  Note:   Current Barriers:  Marland Kitchen Knowledge Deficits related to management of CAD . Chronic Disease Management support and education needs related to CAD and subsequent underlying chronic conditions  . Unable to independently manage CAD . Does not contact provider office for questions/concerns  Nurse Case Manager Clinical Goal(s):  Marland Kitchen Over the next 120 days, patient will verbalize understanding of plan for CAD . Over the next 120 days, patient will work with [facility] and CCM team to address needs related to CAD . Over the next 120 days, patient will attend all scheduled medical appointments: 08-31-2020 at 11 am  Interventions:  . 1:1  collaboration with Valerie Roys, DO regarding development and update of comprehensive plan of care as evidenced by provider attestation and co-signature . Inter-disciplinary care team collaboration (see longitudinal plan of care) . Evaluation of current treatment plan related to CAD and patient's adherence to plan as established by provider. . Advised patient to call the office for changes in condition or new questions or concerns  . Provided education to patient re: heart healthy diet and maintaining healthy heart practices. 07-19-2020: the patient is compliant with heart healthy diet. She is having multiple issues right now but she is maintaining her health as best as possible  . Reviewed medications with patient and discussed compliance. 07-19-2020: The patient endorses compliance with medications regimen.  . Discussed plans with patient for ongoing care management follow up and provided patient with direct contact information for care management team . Reviewed scheduled/upcoming provider appointments including: 08-31-2020 at 11 am  Patient Goals/Self-Care Activities Over the next 120 days, patient will:  - Patient will self administer medications as prescribed Patient will attend all scheduled provider appointments Patient will call pharmacy for medication  refills Patient will call provider office for new concerns or questions Patient will work with BSW to address care coordination needs and will continue to work with the clinical team to address health care and disease management related needs.   - barriers to treatment adherence reviewed and addressed - communicable disease prevention promoted - difficulty of making life-long changes acknowledged - functional limitation screening reviewed - healthy lifestyle promoted - medication-adherence assessment completed - medication side effects managed - self-awareness of signs/symptoms of worsening disease encouraged  Follow Up Plan: Telephone  follow up appointment with care management team member scheduled for: 09-13-2020 at 0900 am       Care Plan : RNCM: Chronic Pain (Adult)- right hip pain  Updates made by Vanita Ingles since 07/19/2020 12:00 AM    Problem: RNCM: Pain Management Plan (Chronic Pain)- right hip   Priority: Medium    Goal: RNCM: Pain Management Plan Developed- Right Hip Pain   Priority: Medium  Note:   Current Barriers:  Marland Kitchen Knowledge Deficits related to managing acute/chronic pain . Non-adherence to scheduled provider appointments . Non-adherence to prescribed medication regimen . Difficulty obtaining medications . Chronic Disease Management support and education needs related to chronic pain . Unable to independently manage chronic right hip pain . Does not contact provider office for questions/concerns  Nurse Case Manager Clinical Goal(s):  Marland Kitchen Over the next 120 days, patient will verbalize understanding of plan for managing pain . Over the next 120 days, patient will meet with RN Care Manager to address chronic pain and discomfort  . Over the next 120 days, patient will attend all scheduled medical appointments: 08-31-2020 . Over the next  120 days patient will demonstrate use of different relaxation  skills and/or diversional activities to assist with pain reduction (distraction, imagery, relaxation, massage, acupressure, TENS, heat, and cold application . Over the next  120  days patient will report pain at a level less than 3 to 4 on a 10-10 rating scale . Over the next   120 days patient will use pharmacological and nonpharmacological pain relief strategies . Over the next 120  days patient will verbalize acceptable level of pain relief and ability to engage in desired activities . Over the next  120 days patient will engage in desired activities without an increase in pain level  Interventions:  . - deep breathing, relaxation and mindfulness use promoted . - effectiveness of pharmacologic therapy  monitored.  07-19-2020: The patient had an injection last week in her hip because she cannot have surgery. The patient states that it has helped her hip- follows up next week with the provider for further evaluation and treatment options.  . - pain assessed- patient denies pain in her hip today but is having issues with shoulder pain now. She states she had a couple of good days where she felt really good and was cleaning out a closet and she feels this caused her to pull something in her shoulder. She is going to discuss with the provider next week when she has follow up about her hip.  . - pain treatment goals reviewed- 07-19-2020: The patient wants to have relief from the pain she is experiencing. Working with the provider to optimize her ability to function and do ADLs/IADLS to the best of her ability  . Evaluation of current treatment plan related to pain in right hip and patient's adherence to plan as established by provider. 07-19-2020: The patient reports the shot she got was very helpful  for relief of the hip pain that she had. Will discuss new shoulder pain she is experiencing at her follow up visit.  . Advised patient to call the provider for worsening pain and discomfort that is unresolved by current pain management regimen . Provided education to patient re: safety and use of DME as needed  . Reviewed medications with patient and discussed compliance. The patient is using Tylenol and Ultram to manage pain and discomfort  . Collaborated with CCM team regarding effective management of pain and review of medications to treat pain. The patient also working with the specialist  . Discussed plans with patient for ongoing care management follow up and provided patient with direct contact information for care management team . Allow patient to maintain a diary of pain ratings, timing, precipitating events, medications, treatments, and what works best to relieve pain,  . Refer to support groups and self-help  groups . Educate patient about the use of pharmacological interventions for pain management- antianxiety, antidepressants, NSAIDS, opioid analgesics,  . Explain the importance of lifestyle modifications to effective pain management   Patient Goals/Self Care Activities:  . Patient verbalizes understanding of plan to manage pain and discomfort . Self-administers medications as prescribed . Attends all scheduled provider appointments . Calls pharmacy for medication refills . Calls provider office for new concerns or questions . - mutually acceptable comfort goal set . - pain assessed . - pain management plan developed . - pain treatment goals reviewed . - patient response to treatment assessed . - sharing of pain management plan with teachers and other caregivers encouraged  Follow Up Plan: Telephone follow up appointment with care management team member scheduled for: 09-13-2020 at 0900 am     Care Plan : RNCM: Depression (Adult)  Updates made by Vanita Ingles since 07/19/2020 12:00 AM    Problem: RNCM: Depression Identification (Depression)   Priority: Medium    Goal: RNCM: Axniety and Depressive Symptoms Identified   Note:   Current Barriers:  Marland Kitchen Knowledge Deficits related to anxiety and depression exacerbated by chronic conditions . Care Coordination needs related to management of anxiety and depression in a patient with multiple chronic conditions and chronic diarrhea over the last several months getting worse . Chronic Disease Management support and education needs related to anxiety and depression in patient with chronic conditions and chronic diarrhea over the last several months.  . Unable to independently manage anxiety and depression as evidence of chronic conditions that exacerbate conditions . Lacks social connections . Does not contact provider office for questions/concerns  Nurse Case Manager Clinical Goal(s):  Marland Kitchen Over the next 120 days, patient will verbalize  understanding of plan for management of anxiety and depression and chronic diarrhea  . Over the next 120 days, patient will work with [facility] team, and pcp to address needs related to anxiety and depression caused by chronic diarrhea and other chronic conditions . Over the next 120 days, patient will demonstrate a decrease in chronic diarrhea  exacerbations as evidenced by decreased episodes of diarrhea, depression and anxiety. . Over the next 120 days, patient will attend all scheduled medical appointments: 08-31-2020 . Over the next 120 days, patient will demonstrate improved adherence to prescribed treatment plan for Anxiety and depression  as evidenced bydecreased episodes of diarrhea and stabilized chronic conditions . Over the next 120 days, patient will demonstrate improved health management independence as evidenced byimproved symptoms and ability to effectively manage care  . Over the next 120 days, patient  will work with CM team pharmacist to review medications that may be contributing to chronic diarrhea . Over the next 120 days, patient will work with CM clinical social worker to manage depression and anxiety.   Interventions:  . 1:1 collaboration with Valerie Roys, DO regarding development and update of comprehensive plan of care as evidenced by provider attestation and co-signature . Inter-disciplinary care team collaboration (see longitudinal plan of care) . Evaluation of current treatment plan related to Depression and anxiety  and patient's adherence to plan as established by provider. 07-19-2020: The patient is frustrated with all of her health problems at this time but she is doing what she can to remain positive. Denies any acute issues with depression or anxiety.  . Advised patient to call the provider for change or worsening sx and sx of depression, anxiety and chronic diarrhea . Provided education to patient re: staying hydrated and eating balanced diet. To write down factors  that may contribute to exacerbation of diarrhea.  07-19-2020: Denies issues at this time with diarrhea  . Reviewed medications with patient and discussed compliance and possible medications that could contribute to diarrhea. 07-19-2020: The patient has been followed by pharm D. Denies any concerns related to medications at this time . Collaborated with CCM team pharmacist  regarding medications that could cause diarrhea. 07-19-2020: has been in contact with the pharmacist.  . Discussed plans with patient for ongoing care management follow up and provided patient with direct contact information for care management team . Reviewed scheduled/upcoming provider appointments including: 08-31-2020 at 11 am . Social Work referral for ongoing support for depression and anxiety . Pharmacy referral for review of medications for possible medication that causes diarrhea   Patient Goals/Self-Care Activities Over the next 120 days, patient will:  - Patient will self administer medications as prescribed Patient will attend all scheduled provider appointments Patient will call pharmacy for medication refills Patient will call provider office for new concerns or questions Patient will work with BSW to address care coordination needs and will continue to work with the clinical team to address health care and disease management related needs.    - anxiety screen reviewed - depression screen reviewed - medication list reviewed  Follow Up Plan: Telephone follow up appointment with care management team member scheduled for: 09-13-2020 at 0900 am       Care Plan : RNCM: Pulmonary Fibrosis/Shortness of Breath Plan of Care  Updates made by Vanita Ingles since 07/19/2020 12:00 AM    Problem: RNCM: Pulmonary Fibrosis and Shortness of Breath   Priority: Medium    Goal: RNCM: Pulmonary Fibrosis and Shortness of breath Disease Progression Minimized or Managed   Priority: Medium  Note:   Current Barriers:  Marland Kitchen Knowledge  deficits related to basic understanding of Pulmonary Fibrosis and shortness of Breath disease process . Knowledge deficits related to basic Pulmonary Fibrosis self care/management . Knowledge deficit related to importance of energy conservation . Unable to independently manage Pulmonary Fibrosis as evidence of being oxygen dependent and worsening shortness of breath. . Does not contact provider office for questions/concerns  Case Manager Clinical Goal(s):  Over the next 120 days, patient will be able to verbalize understanding of Pulmonary Fibrosis action plan and when to seek appropriate levels of medical care  Over the next 120 days, patient will engage in lite exercise as tolerated to build/regain stamina and strength and reduce shortness of breath through activity tolerance  Over the next 120 days, patient will verbalize  basic understanding of Pulmonary Fibrosis disease process and self care activities  Over the next 120 days, patient will not be hospitalized for Pulmonary fibrosis exacerbation  Interventions:   UNABLE to independently: manage pulmonary fibrosis   Provided patient with basic written and verbal pulmonary fibrosis  education on self care/management/and exacerbation prevention   Provided patient with Pulmonary fibrosis  action plan and reinforced importance of daily self assessment  Provided written and verbal instructions on pursed lip breathing and utilized returned demonstration as teach back  Provided instruction about proper use of medications used for management of pulmonary fibrosis including inhalers  Advised patient to self assesses pulmonary fibrosis action plan zone and make appointment with provider if in the yellow zone for 48 hours without improvement.  Provided patient with education about the role of exercise in the management of Pulmonary fibrosis   Provided education about and advised patient to utilize infection prevention strategies to reduce risk  of respiratory infection   Evaluation of recent ECHO and follow up.  The patient states that she is now dealing with pulmonary HTN and they have started her on a new medication: Tyvaso and she has been on that for one week. She will follow up with the providers at [facility] closely. She can not tell of any changes right now as she has not been on the medication long. She is using her oxygen and her breathing is about the same. She is ready for her chronic conditions to balance out. Will continue to monitor for changes.  Patient Goals/Self-Care Activities:  . - activity or exercise based on tolerance encouraged . - barriers to treatment managed . - breathing techniques encouraged . - communicable disease prevention promoted . - medication-adherence assessment completed . - quality of sleep assessed . - rescue (action) plan reviewed . - screen for functional limitations completed and reviewed . - self-awareness of symptom triggers encouraged Follow Up Plan: Telephone follow up appointment with care management team member scheduled for:  09-13-2020 at 0900 am     Plan:Telephone follow up appointment with care management team member scheduled for:  09-13-2020 at 0900 am  Noreene Larsson RN, MSN, [facility] Mobile: [phone]

## 2020-07-26 DIAGNOSIS — M545 Low back pain, unspecified: Secondary | ICD-10-CM | POA: Diagnosis not present

## 2020-07-26 DIAGNOSIS — M7541 Impingement syndrome of right shoulder: Secondary | ICD-10-CM | POA: Diagnosis not present

## 2020-07-26 DIAGNOSIS — M1611 Unilateral primary osteoarthritis, right hip: Secondary | ICD-10-CM | POA: Diagnosis not present

## 2020-08-02 DIAGNOSIS — I270 Primary pulmonary hypertension: Secondary | ICD-10-CM | POA: Diagnosis not present

## 2020-08-02 DIAGNOSIS — R0602 Shortness of breath: Secondary | ICD-10-CM | POA: Diagnosis not present

## 2020-08-16 DIAGNOSIS — Z79899 Other long term (current) drug therapy: Secondary | ICD-10-CM | POA: Diagnosis not present

## 2020-08-16 DIAGNOSIS — Z961 Presence of intraocular lens: Secondary | ICD-10-CM | POA: Diagnosis not present

## 2020-08-25 ENCOUNTER — Ambulatory Visit: Payer: Medicare Other

## 2020-08-26 ENCOUNTER — Ambulatory Visit (INDEPENDENT_AMBULATORY_CARE_PROVIDER_SITE_OTHER): Payer: Medicare Other

## 2020-08-26 VITALS — Ht 63.5 in | Wt 112.0 lb

## 2020-08-26 DIAGNOSIS — Z0000 Encounter for general adult medical examination without abnormal findings: Secondary | ICD-10-CM | POA: Diagnosis not present

## 2020-08-26 NOTE — Progress Notes (Signed)
I connected with Melanie Rollins today by telephone and verified that I am speaking with the correct person using two identifiers. Location patient: home Location provider: work Persons participating in the virtual visit: Melanie Rollins, [name] LPN.   I discussed the limitations, risks, security and privacy concerns of performing an evaluation and management service by telephone and the availability of in person appointments. I also discussed with the patient that there may be a patient responsible charge related to this service. The patient expressed understanding and verbally consented to this telephonic visit.    Interactive audio and video telecommunications were attempted between this provider and patient, however failed, due to patient having technical difficulties OR patient did not have access to video capability.  We continued and completed visit with audio only.     Vital signs may be patient reported or missing.  Subjective:   Melanie Rollins is a 75 y.o. female who presents for Medicare Annual (Subsequent) preventive examination.  Review of Systems     Cardiac Risk Factors include: advanced age (>28mn, >>41women);sedentary lifestyle     Objective:    Today's Vitals   08/26/20 1111  Weight: 112 lb (50.8 kg)  Height: 5' 3.5" (1.613 m)   Body mass index is 19.53 kg/m.  Advanced Directives 08/26/2020 10/01/2019 09/10/2019 08/24/2019 07/21/2018 03/25/2018 03/21/2018  Does Patient Have a Medical Advance Directive? _0  No No  Type of AParamedicof AShip BottomLiving will [place] will [place]ast [place] will Living will;Healthcare Power of Attorney Living will;Healthcare Power of Attorney - -  Does patient want to make changes to medical advance directive? - - - - - No - Patient declined No - Patient declined  Copy of [place] Chart? No - copy requested - Yes - validated most  recent copy scanned in chart (See row information) Yes - validated most recent copy scanned in chart (See row information) Yes - validated most recent copy scanned in chart (See row information) - -  Would patient like information on creating a medical advance directive? - - - - - No - Patient declined No - Patient declined    Current Medications (verified) Outpatient Encounter Medications as of 08/26/2020  Medication Sig  . ACETAMINOPHEN PO Take 1,300 mg by mouth in the morning and at bedtime.  . Acetylcysteine (NAC) 600 MG CAPS Take 1 capsule (600 mg total) by mouth 2 (two) times daily. (Patient taking differently: Take 1,200 mg by mouth daily.)  . acyclovir ointment (ZOVIRAX) 5 % Apply 1 application topically every 3 (three) hours.  . baclofen (LIORESAL) 10 MG tablet Take 1 tablet (10 mg total) by mouth at bedtime as needed for muscle spasms.  .Marland KitchenBIOTIN PO Take 5,000 mcg by mouth daily.  . Calcium Citrate-Vitamin D (CALCIUM + D PO) Take 600 mg by mouth in the morning and at bedtime.   . Cholecalciferol (VITAMIN D) 2000 units CAPS Take 2,000 Units by mouth daily.  .Marland KitchenCINNAMON PO Take 1,000 mg by mouth daily.  .Marland Kitchendenosumab (PROLIA) 60 MG/ML SOSY injection Inject into the skin.  . furosemide (LASIX) 20 MG tablet Take by mouth.  . hydroxychloroquine (PLAQUENIL) 200 MG tablet Take 2 tablets (400 mg total) by mouth daily. (Patient taking differently: Take 200 mg by mouth daily.)  . Multiple Vitamin (MULTIVITAMIN) tablet Take 1 tablet by mouth daily.  . mupirocin ointment (BACTROBAN) 2 % Place 1 application into the nose 2 (  two) times daily.  . mycophenolate (CELLCEPT) 500 MG tablet Take 2 tablets (1,000 mg total) by mouth 2 (two) times daily.  Marland Kitchen NIFEdipine (PROCARDIA-XL/NIFEDICAL-XL) 30 MG 24 hr tablet Take 30 mg by mouth daily.  . Omega-3 Fatty Acids (FISH OIL) 1000 MG CAPS Take 1,000 mg by mouth daily.  . OXYGEN 2 L/min by Continuous infusion (non-IV) route daily.  . permethrin (ELIMITE) 5 % cream    . predniSONE (DELTASONE) 2.5 MG tablet Take 7.5 mg by mouth daily with breakfast.  . pyridoxine (B-6) 100 MG tablet Take by mouth.  . sertraline (ZOLOFT) 100 MG tablet Take 1 tablet (100 mg total) by mouth daily. (Patient taking differently: Take 50 mg by mouth daily.)  . traMADol (ULTRAM) 50 MG tablet Take 50 mg by mouth every 6 (six) hours as needed.  . Treprostinil (TYVASO) 0.6 MG/ML SOLN Inhale into the lungs.  Marland Kitchen zolpidem (AMBIEN) 10 MG tablet Take 0.5 tablets (5 mg total) by mouth at bedtime. (Patient taking differently: Take 5 mg by mouth at bedtime. Takes very seldom)  . omeprazole (PRILOSEC) 20 MG capsule Take 1 capsule (20 mg total) by mouth daily.   No facility-administered encounter medications on file as of 08/26/2020.    Allergies (verified) Imuran [azathioprine]   History: Past Medical History:  Diagnosis Date  . Arthritis    RA  . Cancer ([place])    skin ca  . Closed left hip fracture ([place]) 03/10/2018  . Hip fracture requiring operative repair [facility])    left hip   . Hyperlipidemia   . Nephrolithiasis 03/14/2015  . Pulmonary fibrosis ([place])   . Reynolds syndrome [facility])    Past Surgical History:  Procedure Laterality Date  . COLONOSCOPY WITH PROPOFOL N/A 10/01/2019   Procedure: COLONOSCOPY WITH PROPOFOL;  Surgeon: Virgel Manifold, MD;  Location: [facility] ENDOSCOPY;  Service: Endoscopy;  Laterality: N/A;  . ESOPHAGOGASTRODUODENOSCOPY (EGD) WITH PROPOFOL N/A 10/01/2019   Procedure: ESOPHAGOGASTRODUODENOSCOPY (EGD) WITH PROPOFOL;  Surgeon: Virgel Manifold, MD;  Location: [facility] ENDOSCOPY;  Service: Endoscopy;  Laterality: N/A;  . INTRAMEDULLARY (IM) NAIL INTERTROCHANTERIC Left 03/10/2018   Procedure: INTRAMEDULLARY (IM) NAIL INTERTROCHANTRIC-LEFT TFNA;  Surgeon: Lovell Sheehan, MD;  Location: [facility] ORS;  Service: Orthopedics;  Laterality: Left;  . kidney stones    . LUNG BIOPSY    . SQUAMOUS CELL CARCINOMA EXCISION Left    shin of leg   Family History  Problem  Relation Age of Onset  . Hypertension Mother   . Heart disease Mother        pacemaker and CHF  . Glaucoma Mother   . Hypertension Brother   . Arthritis Sister   . Heart disease Maternal Grandmother   . Heart disease Maternal Grandfather   . Hypertension Paternal Grandmother   . Stroke Paternal Grandmother   . Cancer Paternal Grandfather   . Breast cancer Neg Hx    Social History   Socioeconomic History  . Marital status: Married    Spouse name: Not on file  . Number of children: Not on file  . Years of education: Not on file  . Highest education level: Associate degree: academic program  Occupational History  . Occupation: part time clerical work   Tobacco Use  . Smoking status: Never Smoker  . Smokeless tobacco: Never Used  Vaping Use  . Vaping Use: Never used  Substance and Sexual Activity  . Alcohol use: Yes    Alcohol/week: 0.0 standard drinks    Comment: pt states she drinks  wine on occasion  . Drug use: No  . Sexual activity: Yes  Other Topics Concern  . Not on file  Social History Narrative   In investment club   Working part time   In lady sorority meet once a month   Stitching group meets couple times a month    Attends church    Social Determinants of Radio broadcast assistant Strain: Low Risk   . Difficulty of Paying Living Expenses: Not hard at all  Food Insecurity: No Food Insecurity  . Worried About Charity fundraiser in the Last Year: Never true  . Ran Out of Food in the Last Year: Never true  Transportation Needs: No Transportation Needs  . Lack of Transportation (Medical): No  . Lack of Transportation (Non-Medical): No  Physical Activity: Inactive  . Days of Exercise per Week: 0 days  . Minutes of Exercise per Session: 0 min  Stress: No Stress Concern Present  . Feeling of Stress : Not at all  Social Connections: Not on file    Tobacco Counseling Counseling given: Not Answered   Clinical Intake:  Pre-visit preparation completed:  Yes  Pain : No/denies pain     Nutritional Status: BMI of 19-24  Normal Nutritional Risks: Nausea/ vomitting/ diarrhea (sometimes at night has nausea) Diabetes: No  How often do you need to have someone help you when you read instructions, pamphlets, or other written materials from your doctor or pharmacy?: 1 - Never What is the last grade level you completed in school?: 13yr college  Diabetic? no  Interpreter Needed?: No  Information entered by :: NAllen LPN   Activities of Daily Living In your present state of health, do you have any difficulty performing the following activities: 08/26/2020  Hearing? Y  Comment decreased hearing of left ear  Vision? Y  Comment ocassional blurred vision  Difficulty concentrating or making decisions? N  Walking or climbing stairs? Y  Comment due to hip and SOB  Dressing or bathing? N  Doing errands, shopping? Y  Comment some times has some one drive  Preparing Food and eating ? N  Using the Toilet? N  In the past six months, have you accidently leaked urine? Y  Comment with coughing and not holding too well  Do you have problems with loss of bowel control? Y  Comment feels like it may be due to a supplement she's taking  Managing your Medications? N  Managing your Finances? N  Housekeeping or managing your Housekeeping? N  Some recent data might be hidden    Patient Care Team: JValerie Roys DO as PCP - General (Family Medicine) KEmmaline Kluver, MD (Rheumatology) MCharlie Pitter MD as Referring Physician (Pulmonary Disease) FUbaldo GlassingKJavier Docker MD as Consulting Physician (Cardiology) BLovell Sheehan MD as Consulting Physician (Orthopedic Surgery) BJannet Mantis MD (Dermatology) TVanita Ingles RN as Case Manager ([place] HVladimir Faster [facility](Ph[facility]ist)  Indicate any recent Medical Services you may have received from other than [facility] providers in the past year (date may be approximate).      Assessment:   This is a routine wellness examination for [place]  Hearing/Vision screen  Hearing Screening   125Hz 250Hz 500Hz 1000Hz 2000Hz 3000Hz 4000Hz 6000Hz 8000Hz  Right ear:           Left ear:           Vision Screening Comments: Regular eye exams, [facility]  Dietary issues and  exercise activities discussed: Current Exercise Habits: The patient does not participate in regular exercise at present  Goals    .  Patient Stated      08/26/2020, wants to feel a lot better    .  Pharmacy Care Plan      CARE PLAN ENTRY (see longitudinal plan of care for additional care plan information)  Current Barriers:  . Chronic Disease Management support, education, and care coordination needs related to  Hyperlipidemia, Coronary Artery Disease, GERD, Depression, Anxiety, Osteoporosis, Osteoarthritis, and Pulmonary Fibrosis and Reynaud's phenomenon     Hyperlipidemia Lab Results  Component Value Date/Time   LDLCALC 118 (H) 03/01/2020 10:50 AM   . Pharmacist Clinical Goal(s): o Over the next 60 days, patient will work with PharmD and providers to achieve LDL goal < 70 . Current regimen:  o Fish oil 1076m qd . Interventions: o Reviewed chart for cardiology note.  o Reviewed lab values. o Counseled on therapeutic dose of Fish oil for lipid benefit o Will discuss statin therapy with pcp . Patient self care activities - Over the next 60 days, patient will: o Attend all scheduled appointments Osteopenia . Pharmacist Clinical Goal(s) o Over the next 60 days, patient will work with PharmD and providers to optimize therapy . Current regimen:  . Prolia 60 mg q 6 months . Calcium/vitamin D bid . Interventions: o Reviewed DEXA results o Provided diet and exercise counseling. . Patient self care activities - Over the next 60 days, patient will: o Continue medication as prescribed.  o Schedule follow up DEXA after June 10  Medication management . Pharmacist Clinical  Goal(s): o Over the next 60 days, patient will work with PharmD and providers to achieve optimal medication adherence . [facility] Interventions o Comprehensive medication review performed. o Continue current medication management strategy . Patient self care activities - Over the next 60 days, patient will: o Focus on medication adherence by fill dates o Take medications as prescribed o Report any questions or concerns to PharmD and/or provider(s)  Initial goal documentation     .  PharmD "I am on a lot of medications" (pt-stated)      CARE PLAN ENTRY (see longtitudinal plan of care for additional care plan information)  Current Barriers:  . Polypharmacy; complex patient with multiple comorbidities including connective tissue disorder, pulmonary fibrosis, Raynaud's disease, CAD o Has a question today about Mycophenolate absorption and separation . Most recent eGFR: ~ 77 mL/min o Pulmonary Fibrosis: [facility] NAC 1200 mg QAM, HCQ 400 mg daily, mycophenoate 100 mg BID, prednisone 10 mg daily. O2 daily. PCP prevention: sulfamethoxazole/TMP 800/160 mg TIW o Osteoporosis: s/p fall, fracture in 2019. Prolia Q6M. Calcium 1200 mg daily (600 mg BID) and Vitamin D 2000 units daily o Chronic pain/arthritis: acetaminophen 1300 mg BID (previously taking TID prior to receiving joint injection); tramadol 50 mg PRN, baclofen 10 mg PRN o HTN/Raynaud's: nifedipine 30 mg daily o GERD: omeprazole 20 mg daily; follows w/ Dr. VTyrone Nine Colonoscopy did not reveal a cause for anemia.  o Depression/anxiety: sertraline 100 mg daily o Insomnia: zolpidem 5 mg PRN, ~2-3 times weekly  o Additional supplements: MVI, biotin 5000 mcg daily, omega 3 fatty acids 1000 mg daily, cinnamon daily   Pharmacist Clinical Goal(s):  .Marland KitchenOver the next 90 days, patient will work with PharmD and provider towards optimized medication  management  Interventions: . Comprehensive medication review performed; medication list updated in electronic medical  record . Inter-disciplinary care team collaboration (see longitudinal plan of care) . Discussed appropriate separation of mycophenoate from food. Reviewed that the most important thing is consistency. Patient verbalized understanding.   Patient Self Care Activities:  . Patient will take medications as prescribed  Please see past updates related to this goal by clicking on the "Past Updates" button in the selected goal        Depression Screen PHQ 2/9 Scores 08/26/2020 03/01/2020 08/24/2019 08/24/2019 07/22/2019 02/04/2019 08/04/2018  PHQ - 2 Score 1 2 0 0 0 0 0  PHQ- 9 Score - 8 6 - - 4 0    Fall Risk Fall Risk  08/26/2020 03/01/2020 08/24/2019 07/21/2018 01/13/2018  Falls in the past year? 0 0 0 1 No  Number falls in past yr: - 0 0 0 -  Injury with Fall? - 0 0 1 -  Risk for fall due to : Impaired balance/gait;Medication side effect - - - -  Follow up Falls evaluation completed;Education provided;Falls prevention discussed Falls evaluation completed - - -    FALL RISK PREVENTION PERTAINING TO THE HOME:  Any stairs in or around the home? Yes  If so, are there any without handrails? No  Home free of loose throw rugs in walkways, pet beds, electrical cords, etc? Yes  Adequate lighting in your home to reduce risk of falls? Yes   ASSISTIVE DEVICES UTILIZED TO PREVENT FALLS:  Life alert? No  Use of a cane, walker or w/c? Yes  Grab bars in the bathroom? Yes  Shower chair or bench in shower? Yes  Elevated toilet seat or a handicapped toilet? Yes   TIMED UP AND GO:  Was the test performed? No .   Cognitive Function:     6CIT Screen 08/26/2020 08/24/2019 07/21/2018 07/15/2017  What Year? 0 points 0 points 0 points 0 points  What month? 0 points 0 points 0 points 0 points  What time? 0 points 0 points 0 points 0 points  Count back from 20 0 points 0 points 0 points 0 points   Months in reverse 0 points 0 points 0 points 0 points  Repeat phrase 0 points 0 points 0 points 0 points  Total Score 0 0 0 0    Immunizations Immunization History  Administered Date(s) Administered  . Influenza, High Dose Seasonal PF 01/19/2018  . Influenza-Unspecified 02/17/2015, 03/09/2016, 02/07/2017, 02/08/2019  . PFIZER(Purple Top)SARS-COV-2 Vaccination 06/30/2019, 07/21/2019  . Pneumococcal Conjugate-13 07/02/2014  . Pneumococcal Polysaccharide-23 07/04/2015  . Td 03/16/2009  . Zoster 07/06/2016  . Zoster Recombinat (Shingrix) 07/11/2016, 10/29/2016    TDAP status: Due, Education has been provided regarding the importance of this vaccine. Advised may receive this vaccine at local pharmacy or Health Dept. Aware to provide a copy of the vaccination record if obtained from local pharmacy or Health Dept. Verbalized acceptance and understanding.  Flu Vaccine status: Up to date  Pneumococcal vaccine status: Up to date  Covid-19 vaccine status: Completed vaccines  Qualifies for Shingles Vaccine? Yes   Zostavax completed Yes   Shingrix Completed?: Yes  Screening Tests Health Maintenance  Topic Date Due  . TETANUS/TDAP  03/17/2019  . DEXA SCAN  10/28/2020  . INFLUENZA VACCINE  12/19/2020  . MAMMOGRAM  11/01/2021  . COLONOSCOPY (Pts 45-21yr Insurance coverage will need to be confirmed)  10/01/2029  . COVID-19 Vaccine  Completed  . Hepatitis C Screening  Completed  . PNA vac Low Risk Adult  Completed  . HPV VACCINES  Aged Out  Health Maintenance  Health Maintenance Due  Topic Date Due  . TETANUS/TDAP  03/17/2019    Colorectal cancer screening: Type of screening: Colonoscopy. Completed 10/02/2019. Repeat every 2 years  Mammogram status: Completed 11/02/2019. Repeat every year  Bone Density status: Completed 10/29/2018.   Lung Cancer Screening: (Low Dose CT Chest recommended if Age 52-80 years, 30 pack-year currently smoking OR have quit w/in 15years.) does not  qualify.   Lung Cancer Screening Referral: no  Additional Screening:  Hepatitis C Screening: does qualify; Completed 07/15/2017  Vision Screening: Recommended annual ophthalmology exams for early detection of glaucoma and other disorders of the eye. Is the patient up to date with their annual eye exam?  Yes  Who is the provider or what is the name of the office in which the patient attends annual eye exams? [facility] If pt is not established with a provider, would they like to be referred to a provider to establish care? No .   Dental Screening: Recommended annual dental exams for proper oral hygiene  Community Resource Referral / Chronic Care Management: CRR required this visit?  No   CCM required this visit?  No      Plan:     I have personally reviewed and noted the following in the patient's chart:   . Medical and social history . Use of alcohol, tobacco or illicit drugs  . Current medications and supplements . Functional ability and status . Nutritional status . Physical activity . Advanced directives . List of other physicians . Hospitalizations, surgeries, and ER visits in previous 12 months . Vitals . Screenings to include cognitive, depression, and falls . Referrals and appointments  In addition, I have reviewed and discussed with patient certain preventive protocols, quality metrics, and best practice recommendations. A written personalized care plan for preventive services as well as general preventive health recommendations were provided to patient.     Kellie Simmering, LPN   01/21/7901   Nurse Notes:

## 2020-08-26 NOTE — Patient Instructions (Signed)
Melanie Rollins , Thank you for taking time to come for your Medicare Wellness Visit. I appreciate your ongoing commitment to your health goals. Please review the following plan we discussed and let me know if I can assist you in the future.   Screening recommendations/referrals: Colonoscopy: completed 10/02/2019, due 10/01/2021 Mammogram: completed 11/02/2019 Bone Density: completed 10/29/2018 Recommended yearly ophthalmology/optometry visit for glaucoma screening and checkup Recommended yearly dental visit for hygiene and checkup  Vaccinations: Influenza vaccine: completed per patient Pneumococcal vaccine: completed 07/04/2015 Tdap vaccine: due Shingles vaccine: discussed   Covid-19: 01/17/2020, 07/21/2019, 06/30/2019  Advanced directives: Please bring a copy of your POA (Power of Attorney) and/or Living Will to your next appointment.   Conditions/risks identified: none  Next appointment: Follow up in one year for your annual wellness visit    Preventive Care 65 Years and Older, Female Preventive care refers to lifestyle choices and visits with your health care provider that can promote health and wellness. What does preventive care include?  A yearly physical exam. This is also called an annual well check.  Dental exams once or twice a year.  Routine eye exams. Ask your health care provider how often you should have your eyes checked.  Personal lifestyle choices, including:  Daily care of your teeth and gums.  Regular physical activity.  Eating a healthy diet.  Avoiding tobacco and drug use.  Limiting alcohol use.  Practicing safe sex.  Taking low-dose aspirin every day.  Taking vitamin and mineral supplements as recommended by your health care provider. What happens during an annual well check? The services and screenings done by your health care provider during your annual well check will depend on your age, overall health, lifestyle risk factors, and family history of  disease. Counseling  Your health care provider may ask you questions about your:  Alcohol use.  Tobacco use.  Drug use.  Emotional well-being.  Home and relationship well-being.  Sexual activity.  Eating habits.  History of falls.  Memory and ability to understand (cognition).  Work and work Statistician.  Reproductive health. Screening  You may have the following tests or measurements:  Height, weight, and BMI.  Blood pressure.  Lipid and cholesterol levels. These may be checked every 5 years, or more frequently if you are over 56 years old.  Skin check.  Lung cancer screening. You may have this screening every year starting at age 58 if you have a 30-pack-year history of smoking and currently smoke or have quit within the past 15 years.  Fecal occult blood test (FOBT) of the stool. You may have this test every year starting at age 93.  Flexible sigmoidoscopy or colonoscopy. You may have a sigmoidoscopy every 5 years or a colonoscopy every 10 years starting at age 64.  Hepatitis C blood test.  Hepatitis B blood test.  Sexually transmitted disease (STD) testing.  Diabetes screening. This is done by checking your blood sugar (glucose) after you have not eaten for a while (fasting). You may have this done every 1-3 years.  Bone density scan. This is done to screen for osteoporosis. You may have this done starting at age 75.  Mammogram. This may be done every 1-2 years. Talk to your health care provider about how often you should have regular mammograms. Talk with your health care provider about your test results, treatment options, and if necessary, the need for more tests. Vaccines  Your health care provider may recommend certain vaccines, such as:  Influenza vaccine. This  is recommended every year.  Tetanus, diphtheria, and acellular pertussis (Tdap, Td) vaccine. You may need a Td booster every 10 years.  Zoster vaccine. You may need this after age  71.  Pneumococcal 13-valent conjugate (PCV13) vaccine. One dose is recommended after age 22.  Pneumococcal polysaccharide (PPSV23) vaccine. One dose is recommended after age 68. Talk to your health care provider about which screenings and vaccines you need and how often you need them. This information is not intended to replace advice given to you by your health care provider. Make sure you discuss any questions you have with your health care provider. Document Released: 06/03/2015 Document Revised: 01/25/2016 Document Reviewed: 03/08/2015 Elsevier Interactive Patient Education  2017 [place] Prevention in the Home Falls can cause injuries. They can happen to people of all ages. There are many things you can do to make your home safe and to help prevent falls. What can I do on the outside of my home?  Regularly fix the edges of walkways and driveways and fix any cracks.  Remove anything that might make you trip as you walk through a door, such as a raised step or threshold.  Trim any bushes or trees on the path to your home.  Use bright outdoor lighting.  Clear any walking paths of anything that might make someone trip, such as rocks or tools.  Regularly check to see if handrails are loose or broken. Make sure that both sides of any steps have handrails.  Any raised decks and porches should have guardrails on the edges.  Have any leaves, snow, or ice cleared regularly.  Use sand or salt on walking paths during winter.  Clean up any spills in your garage right away. This includes oil or grease spills. What can I do in the bathroom?  Use night lights.  Install grab bars by the toilet and in the tub and shower. Do not use towel bars as grab bars.  Use non-skid mats or decals in the tub or shower.  If you need to sit down in the shower, use a plastic, non-slip stool.  Keep the floor dry. Clean up any water that spills on the floor as soon as it happens.  Remove  soap buildup in the tub or shower regularly.  Attach bath mats securely with double-sided non-slip rug tape.  Do not have throw rugs and other things on the floor that can make you trip. What can I do in the bedroom?  Use night lights.  Make sure that you have a light by your bed that is easy to reach.  Do not use any sheets or blankets that are too big for your bed. They should not hang down onto the floor.  Have a firm chair that has side arms. You can use this for support while you get dressed.  Do not have throw rugs and other things on the floor that can make you trip. What can I do in the kitchen?  Clean up any spills right away.  Avoid walking on wet floors.  Keep items that you use a lot in easy-to-reach places.  If you need to reach something above you, use a strong step stool that has a grab bar.  Keep electrical cords out of the way.  Do not use floor polish or wax that makes floors slippery. If you must use wax, use non-skid floor wax.  Do not have throw rugs and other things on the floor that can make you  trip. What can I do with my stairs?  Do not leave any items on the stairs.  Make sure that there are handrails on both sides of the stairs and use them. Fix handrails that are broken or loose. Make sure that handrails are as long as the stairways.  Check any carpeting to make sure that it is firmly attached to the stairs. Fix any carpet that is loose or worn.  Avoid having throw rugs at the top or bottom of the stairs. If you do have throw rugs, attach them to the floor with carpet tape.  Make sure that you have a light switch at the top of the stairs and the bottom of the stairs. If you do not have them, ask someone to add them for you. What else can I do to help prevent falls?  Wear shoes that:  Do not have high heels.  Have rubber bottoms.  Are comfortable and fit you well.  Are closed at the toe. Do not wear sandals.  If you use a  stepladder:  Make sure that it is fully opened. Do not climb a closed stepladder.  Make sure that both sides of the stepladder are locked into place.  Ask someone to hold it for you, if possible.  Clearly mark and make sure that you can see:  Any grab bars or handrails.  First and last steps.  Where the edge of each step is.  Use tools that help you move around (mobility aids) if they are needed. These include:  Canes.  Walkers.  Scooters.  Crutches.  Turn on the lights when you go into a dark area. Replace any light bulbs as soon as they burn out.  Set up your furniture so you have a clear path. Avoid moving your furniture around.  If any of your floors are uneven, fix them.  If there are any pets around you, be aware of where they are.  Review your medicines with your doctor. Some medicines can make you feel dizzy. This can increase your chance of falling. Ask your doctor what other things that you can do to help prevent falls. This information is not intended to replace advice given to you by your health care provider. Make sure you discuss any questions you have with your health care provider. Document Released: 03/03/2009 Document Revised: 10/13/2015 Document Reviewed: 06/11/2014 Elsevier Interactive Patient Education  2017 [employer].

## 2020-08-30 DIAGNOSIS — M1611 Unilateral primary osteoarthritis, right hip: Secondary | ICD-10-CM | POA: Diagnosis not present

## 2020-08-30 DIAGNOSIS — M545 Low back pain, unspecified: Secondary | ICD-10-CM | POA: Diagnosis not present

## 2020-08-30 DIAGNOSIS — M19011 Primary osteoarthritis, right shoulder: Secondary | ICD-10-CM | POA: Diagnosis not present

## 2020-08-31 ENCOUNTER — Encounter: Payer: Self-pay | Admitting: Family Medicine

## 2020-08-31 ENCOUNTER — Ambulatory Visit (INDEPENDENT_AMBULATORY_CARE_PROVIDER_SITE_OTHER): Payer: Medicare Other | Admitting: Family Medicine

## 2020-08-31 ENCOUNTER — Other Ambulatory Visit: Payer: Self-pay

## 2020-08-31 VITALS — BP 116/75 | HR 106 | Temp 97.7°F | Wt 111.6 lb

## 2020-08-31 DIAGNOSIS — F419 Anxiety disorder, unspecified: Secondary | ICD-10-CM | POA: Diagnosis not present

## 2020-08-31 DIAGNOSIS — M368 Systemic disorders of connective tissue in other diseases classified elsewhere: Secondary | ICD-10-CM | POA: Diagnosis not present

## 2020-08-31 DIAGNOSIS — J984 Other disorders of lung: Secondary | ICD-10-CM

## 2020-08-31 DIAGNOSIS — J9611 Chronic respiratory failure with hypoxia: Secondary | ICD-10-CM | POA: Diagnosis not present

## 2020-08-31 DIAGNOSIS — E782 Mixed hyperlipidemia: Secondary | ICD-10-CM | POA: Diagnosis not present

## 2020-08-31 DIAGNOSIS — Z8639 Personal history of other endocrine, nutritional and metabolic disease: Secondary | ICD-10-CM | POA: Diagnosis not present

## 2020-08-31 DIAGNOSIS — I272 Pulmonary hypertension, unspecified: Secondary | ICD-10-CM | POA: Diagnosis not present

## 2020-08-31 DIAGNOSIS — D500 Iron deficiency anemia secondary to blood loss (chronic): Secondary | ICD-10-CM | POA: Diagnosis not present

## 2020-08-31 DIAGNOSIS — F32A Depression, unspecified: Secondary | ICD-10-CM

## 2020-08-31 DIAGNOSIS — I2510 Atherosclerotic heart disease of native coronary artery without angina pectoris: Secondary | ICD-10-CM | POA: Diagnosis not present

## 2020-08-31 DIAGNOSIS — J8410 Pulmonary fibrosis, unspecified: Secondary | ICD-10-CM | POA: Diagnosis not present

## 2020-08-31 MED ORDER — SERTRALINE HCL 50 MG PO TABS: 50.0000 mg | ORAL_TABLET | Freq: Every day | ORAL | 1 refills | Status: AC

## 2020-08-31 NOTE — Progress Notes (Signed)
BP 116/75   Pulse (!) 106   Temp 97.7 F (36.5 C) (Oral)   Wt 111 lb 9.6 oz (50.6 kg)   LMP  (LMP Unknown)   SpO2 98%   BMI 19.46 kg/m    Subjective:    Patient ID: Melanie Rollins, female    DOB: 12-05-1945, 75 y.o.   MRN: [id number]  HPI: Melanie Rollins is a 75 y.o. female  Chief Complaint  Patient presents with  . Coronary Artery Disease  . Hypertension  . Depression   Diagnosed with pulmonary hypertension. Has been following with pulmonology. She notes that it has been tough and she has not been feeling well. She feels like it has been going on longer than she thought, she thought it was just her pulmonary fibrosis getting worse.   HYPERLIPIDEMIA Hyperlipidemia status: excellent compliance Satisfied with current treatment?  yes Side effects:  no Medication compliance: excellent compliance Past cholesterol meds: none Supplements: fish oil Aspirin:  no The 10-year ASCVD risk score Mikey Bussing DC Jr., et al., 2013) is: 10.7%   Values used to calculate the score:     Age: 18 years     Sex: Female     Is Non-Hispanic African American: No     Diabetic: No     Tobacco smoker: No     Systolic Blood Pressure: 836 mmHg     Is BP treated: No     HDL Cholesterol: 58 mg/dL     Total Cholesterol: 189 mg/dL  DEPRESSION Mood status: controlled Satisfied with current treatment?: yes Symptom severity: mild  Duration of current treatment : chronic Side effects: no Medication compliance: excellent compliance Psychotherapy/counseling: no  Previous psychiatric medications: sertraline Depressed mood: no Anxious mood: no Anhedonia: no Significant weight loss or gain: no Insomnia: no  Fatigue: no Feelings of worthlessness or guilt: no Impaired concentration/indecisiveness: no Suicidal ideations: no Hopelessness: no Crying spells: no Depression screen [facility] 2/9 08/31/2020 08/26/2020 03/01/2020 08/24/2019 08/24/2019  Decreased Interest _0 0 0  Down, Depressed, Hopeless 1 0 1 0 0   PHQ - 2 Score _1 0 0  Altered sleeping 0 - 1 2 -  Tired, decreased energy 2 - 3 2 -  Change in appetite 0 - 2 2 -  Feeling bad or failure about yourself  0 - 0 0 -  Trouble concentrating 0 - 0 0 -  Moving slowly or fidgety/restless 0 - 0 0 -  Suicidal thoughts 0 - 0 0 -  PHQ-9 Score 4 - 8 6 -  Difficult doing work/chores Not difficult at all - Not difficult at all Somewhat difficult -  Some recent data might be hidden    Relevant past medical, surgical, family and social history reviewed and updated as indicated. Interim medical history since our last visit reviewed. Allergies and medications reviewed and updated.  Review of Systems  Constitutional: Negative.   Respiratory: Positive for shortness of breath. Negative for apnea, cough, choking, chest tightness, wheezing and stridor.   Cardiovascular: Negative.   Gastrointestinal: Negative.   Musculoskeletal: Negative.   Skin: Negative.   Psychiatric/Behavioral: Negative.     Per HPI unless specifically indicated above     Objective:    BP 116/75   Pulse (!) 106   Temp 97.7 F (36.5 C) (Oral)   Wt 111 lb 9.6 oz (50.6 kg)   LMP  (LMP Unknown)   SpO2 98%   BMI 19.46 kg/m   Wt Readings from Last  3 Encounters:  08/31/20 111 lb 9.6 oz (50.6 kg)  08/26/20 112 lb (50.8 kg)  03/01/20 119 lb 12.8 oz (54.3 kg)    Physical Exam Vitals and nursing note reviewed.  Constitutional:      General: She is not in acute distress.    Appearance: Normal appearance. She is not ill-appearing, toxic-appearing or diaphoretic.  HENT:     Head: Normocephalic and atraumatic.     Right Ear: External ear normal.     Left Ear: External ear normal.     Nose: Nose normal.     Mouth/Throat:     Mouth: Mucous membranes are moist.     Pharynx: Oropharynx is clear.  Eyes:     General: No scleral icterus.       Right eye: No discharge.        Left eye: No discharge.     Extraocular Movements: Extraocular movements intact.      Conjunctiva/sclera: Conjunctivae normal.     Pupils: Pupils are equal, round, and reactive to light.  Cardiovascular:     Rate and Rhythm: Normal rate and regular rhythm.     Pulses: Normal pulses.     Heart sounds: Normal heart sounds. No murmur heard. No friction rub. No gallop.   Pulmonary:     Effort: Pulmonary effort is normal. No respiratory distress.     Breath sounds: Normal breath sounds. No stridor. No wheezing, rhonchi or rales.  Chest:     Chest wall: No tenderness.  Musculoskeletal:        General: Normal range of motion.     Cervical back: Normal range of motion and neck supple.  Skin:    General: Skin is warm and dry.     Capillary Refill: Capillary refill takes less than 2 seconds.     Coloration: Skin is not jaundiced or pale.     Findings: No bruising, erythema, lesion or rash.  Neurological:     General: No focal deficit present.     Mental Status: She is alert and oriented to person, place, and time. Mental status is at baseline.  Psychiatric:        Mood and Affect: Mood normal.        Behavior: Behavior normal.        Thought Content: Thought content normal.        Judgment: Judgment normal.     Results for orders placed or performed in visit on 08/31/20  Comprehensive metabolic panel  Result Value Ref Range   Glucose 110 (H) 65 - 99 mg/dL   BUN 16 8 - 27 mg/dL   Creatinine, Ser 0.89 0.57 - 1.00 mg/dL   eGFR 68 >59 mL/min/1.73   BUN/Creatinine Ratio 18 12 - 28   Sodium 136 134 - 144 mmol/L   Potassium 4.3 3.5 - 5.2 mmol/L   Chloride 98 96 - 106 mmol/L   CO2 18 (L) 20 - 29 mmol/L   Calcium 9.7 8.7 - 10.3 mg/dL   Total Protein 7.0 6.0 - 8.5 g/dL   Albumin 4.4 3.7 - 4.7 g/dL   Globulin, Total 2.6 1.5 - 4.5 g/dL   Albumin/Globulin Ratio 1.7 1.2 - 2.2   Bilirubin Total 0.3 0.0 - 1.2 mg/dL   Alkaline Phosphatase 88 44 - 121 IU/L   AST 53 (H) 0 - 40 IU/L   ALT 48 (H) 0 - 32 IU/L  CBC with Differential/Platelet  Result Value Ref Range   WBC 11.0 (H)  3.4 -  10.8 x10E3/uL   RBC 4.24 3.77 - 5.28 x10E6/uL   Hemoglobin 12.1 11.1 - 15.9 g/dL   Hematocrit 37.5 34.0 - 46.6 %   MCV 88 79 - 97 fL   MCH 28.5 26.6 - 33.0 pg   MCHC 32.3 31.5 - 35.7 g/dL   RDW 12.9 11.7 - 15.4 %   Platelets 403 150 - 450 x10E3/uL   Neutrophils 88 Not Estab. %   Lymphs 6 Not Estab. %   Monocytes 6 Not Estab. %   Eos 0 Not Estab. %   Basos 0 Not Estab. %   Neutrophils Absolute 9.6 (H) 1.4 - 7.0 x10E3/uL   Lymphocytes Absolute 0.7 0.7 - 3.1 x10E3/uL   Monocytes Absolute 0.6 0.1 - 0.9 x10E3/uL   EOS (ABSOLUTE) 0.0 0.0 - 0.4 x10E3/uL   Basophils Absolute 0.0 0.0 - 0.2 x10E3/uL   Immature Granulocytes 0 Not Estab. %   Immature Grans (Abs) 0.0 0.0 - 0.1 x10E3/uL  Lipid Panel w/o Chol/HDL Ratio  Result Value Ref Range   Cholesterol, Total 189 100 - 199 mg/dL   Triglycerides 143 0 - 149 mg/dL   HDL 58 >39 mg/dL   VLDL Cholesterol Cal 25 5 - 40 mg/dL   LDL Chol Calc (NIH) 106 (H) 0 - 99 mg/dL  Iron and TIBC  Result Value Ref Range   Total Iron Binding Capacity 322 250 - 450 ug/dL   UIBC 267 118 - 369 ug/dL   Iron 55 27 - 139 ug/dL   Iron Saturation 17 15 - 55 %  Ferritin  Result Value Ref Range   Ferritin 88 15 - 150 ng/mL  B12  Result Value Ref Range   Vitamin B-12 1,633 (H) 232 - 1,245 pg/mL      Assessment & Plan:   Problem List Items Addressed This Visit      Cardiovascular and Mediastinum   Pulmonary hypertension ([place])    Following with pulmonology. Continue to monitor. Call with any concerns.       Relevant Medications   furosemide (LASIX) 20 MG tablet   Other Relevant Orders   Comprehensive metabolic panel (Completed)     Respiratory   Pulmonary fibrosis ([place])    Following with pulmonology. Call with any concerns. Continue to monitor.       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 Call with any concerns. Continue to monitor.       Chronic respiratory failure with hypoxia [facility])     Following with pulmonology. Call with any concerns. Continue to monitor.         Other   Anxiety and depression - Primary    Under good control on current regimen. Continue current regimen. Continue to monitor. Call with any concerns. Refills given.        Relevant Medications   sertraline (ZOLOFT) 50 MG tablet   Hyperlipidemia    Under good control on current regimen. Continue current regimen. Continue to monitor. Call with any concerns. Refills given. Labs drawn today.        Relevant Medications   furosemide (LASIX) 20 MG tablet   Other Relevant Orders   Comprehensive metabolic panel (Completed)   Lipid Panel w/o Chol/HDL Ratio (Completed)   Iron deficiency anemia due to chronic blood loss    Rechecking labs today. Await results. Treat as needed.       Relevant Orders   Comprehensive metabolic panel (Completed)   CBC with Differential/Platelet (Completed)  Iron and TIBC (Completed)   Ferritin (Completed)    Other Visit Diagnoses    History of non anemic vitamin B12 deficiency       Relevant Orders   B12 (Completed)       Follow up plan: Return in about 6 months (around 03/02/2021).

## 2020-09-01 LAB — CBC WITH DIFFERENTIAL/PLATELET
Basophils Absolute: 0 10*3/uL (ref 0.0–0.2)
Basos: 0 %
EOS (ABSOLUTE): 0 10*3/uL (ref 0.0–0.4)
Eos: 0 %
Hematocrit: 37.5 % (ref 34.0–46.6)
Hemoglobin: 12.1 g/dL (ref 11.1–15.9)
Immature Grans (Abs): 0 10*3/uL (ref 0.0–0.1)
Immature Granulocytes: 0 %
Lymphocytes Absolute: 0.7 10*3/uL (ref 0.7–3.1)
Lymphs: 6 %
MCH: 28.5 pg (ref 26.6–33.0)
MCHC: 32.3 g/dL (ref 31.5–35.7)
MCV: 88 fL (ref 79–97)
Monocytes Absolute: 0.6 10*3/uL (ref 0.1–0.9)
Monocytes: 6 %
Neutrophils Absolute: 9.6 10*3/uL — ABNORMAL HIGH (ref 1.4–7.0)
Neutrophils: 88 %
Platelets: 403 10*3/uL (ref 150–450)
RBC: 4.24 x10E6/uL (ref 3.77–5.28)
RDW: 12.9 % (ref 11.7–15.4)
WBC: 11 10*3/uL — ABNORMAL HIGH (ref 3.4–10.8)

## 2020-09-01 LAB — COMPREHENSIVE METABOLIC PANEL
ALT: 48 IU/L — ABNORMAL HIGH (ref 0–32)
AST: 53 IU/L — ABNORMAL HIGH (ref 0–40)
Albumin/Globulin Ratio: 1.7 (ref 1.2–2.2)
Albumin: 4.4 g/dL (ref 3.7–4.7)
Alkaline Phosphatase: 88 IU/L (ref 44–121)
BUN/Creatinine Ratio: 18 (ref 12–28)
BUN: 16 mg/dL (ref 8–27)
Bilirubin Total: 0.3 mg/dL (ref 0.0–1.2)
CO2: 18 mmol/L — ABNORMAL LOW (ref 20–29)
Calcium: 9.7 mg/dL (ref 8.7–10.3)
Chloride: 98 mmol/L (ref 96–106)
Creatinine, Ser: 0.89 mg/dL (ref 0.57–1.00)
Globulin, Total: 2.6 g/dL (ref 1.5–4.5)
Glucose: 110 mg/dL — ABNORMAL HIGH (ref 65–99)
Potassium: 4.3 mmol/L (ref 3.5–5.2)
Sodium: 136 mmol/L (ref 134–144)
Total Protein: 7 g/dL (ref 6.0–8.5)
eGFR: 68 mL/min/{1.73_m2} (ref >59)

## 2020-09-01 LAB — LIPID PANEL W/O CHOL/HDL RATIO
Cholesterol, Total: 189 mg/dL (ref 100–199)
HDL: 58 mg/dL (ref >39)
LDL Chol Calc (NIH): 106 mg/dL — ABNORMAL HIGH (ref 0–99)
Triglycerides: 143 mg/dL (ref 0–149)
VLDL Cholesterol Cal: 25 mg/dL (ref 5–40)

## 2020-09-01 LAB — VITAMIN B12: Vitamin B-12: 1633 pg/mL — ABNORMAL HIGH (ref 232–1245)

## 2020-09-01 LAB — IRON AND TIBC
Iron Saturation: 17 % (ref 15–55)
Iron: 55 ug/dL (ref 27–139)
Total Iron Binding Capacity: 322 ug/dL (ref 250–450)
UIBC: 267 ug/dL (ref 118–369)

## 2020-09-01 LAB — FERRITIN: Ferritin: 88 ng/mL (ref 15–150)

## 2020-09-03 ENCOUNTER — Encounter: Payer: Self-pay | Admitting: Family Medicine

## 2020-09-03 NOTE — Assessment & Plan Note (Signed)
Under good control on current regimen. Continue current regimen. Continue to monitor. Call with any concerns. Refills given. Labs drawn today.

## 2020-09-03 NOTE — Assessment & Plan Note (Signed)
Under good control on current regimen. Continue current regimen. Continue to monitor. Call with any concerns. Refills given.

## 2020-09-03 NOTE — Assessment & Plan Note (Signed)
Following with pulmonology. Call with any concerns. Continue to monitor.

## 2020-09-03 NOTE — Assessment & Plan Note (Signed)
Rechecking labs today. Await results. Treat as needed.  °

## 2020-09-03 NOTE — Assessment & Plan Note (Signed)
Following with pulmonology. Call with any concerns. Continue to monitor.  

## 2020-09-03 NOTE — Assessment & Plan Note (Signed)
Following with pulmonology. Continue to monitor. Call with any concerns.

## 2020-09-05 ENCOUNTER — Ambulatory Visit (INDEPENDENT_AMBULATORY_CARE_PROVIDER_SITE_OTHER): Payer: Medicare Other | Admitting: Pharmacist

## 2020-09-05 DIAGNOSIS — M7541 Impingement syndrome of right shoulder: Secondary | ICD-10-CM | POA: Diagnosis not present

## 2020-09-05 DIAGNOSIS — Z7952 Long term (current) use of systemic steroids: Secondary | ICD-10-CM | POA: Diagnosis not present

## 2020-09-05 DIAGNOSIS — Z79899 Other long term (current) drug therapy: Secondary | ICD-10-CM | POA: Diagnosis not present

## 2020-09-05 DIAGNOSIS — M1611 Unilateral primary osteoarthritis, right hip: Secondary | ICD-10-CM | POA: Diagnosis not present

## 2020-09-05 DIAGNOSIS — F32A Depression, unspecified: Secondary | ICD-10-CM

## 2020-09-05 DIAGNOSIS — E782 Mixed hyperlipidemia: Secondary | ICD-10-CM | POA: Diagnosis not present

## 2020-09-05 DIAGNOSIS — I509 Heart failure, unspecified: Secondary | ICD-10-CM | POA: Diagnosis not present

## 2020-09-05 DIAGNOSIS — J9611 Chronic respiratory failure with hypoxia: Secondary | ICD-10-CM | POA: Diagnosis not present

## 2020-09-05 DIAGNOSIS — H579 Unspecified disorder of eye and adnexa: Secondary | ICD-10-CM | POA: Diagnosis not present

## 2020-09-05 DIAGNOSIS — M069 Rheumatoid arthritis, unspecified: Secondary | ICD-10-CM | POA: Diagnosis not present

## 2020-09-05 DIAGNOSIS — I2510 Atherosclerotic heart disease of native coronary artery without angina pectoris: Secondary | ICD-10-CM

## 2020-09-05 DIAGNOSIS — M359 Systemic involvement of connective tissue, unspecified: Secondary | ICD-10-CM | POA: Diagnosis not present

## 2020-09-05 DIAGNOSIS — I110 Hypertensive heart disease with heart failure: Secondary | ICD-10-CM | POA: Diagnosis not present

## 2020-09-05 DIAGNOSIS — K219 Gastro-esophageal reflux disease without esophagitis: Secondary | ICD-10-CM | POA: Diagnosis not present

## 2020-09-05 DIAGNOSIS — D649 Anemia, unspecified: Secondary | ICD-10-CM | POA: Diagnosis not present

## 2020-09-05 DIAGNOSIS — M810 Age-related osteoporosis without current pathological fracture: Secondary | ICD-10-CM | POA: Diagnosis not present

## 2020-09-05 DIAGNOSIS — R32 Unspecified urinary incontinence: Secondary | ICD-10-CM | POA: Diagnosis not present

## 2020-09-05 DIAGNOSIS — E7800 Pure hypercholesterolemia, unspecified: Secondary | ICD-10-CM | POA: Diagnosis not present

## 2020-09-05 DIAGNOSIS — J8410 Pulmonary fibrosis, unspecified: Secondary | ICD-10-CM | POA: Diagnosis not present

## 2020-09-05 DIAGNOSIS — Z9181 History of falling: Secondary | ICD-10-CM | POA: Diagnosis not present

## 2020-09-05 DIAGNOSIS — F419 Anxiety disorder, unspecified: Secondary | ICD-10-CM

## 2020-09-05 DIAGNOSIS — Z9981 Dependence on supplemental oxygen: Secondary | ICD-10-CM | POA: Diagnosis not present

## 2020-09-05 DIAGNOSIS — I272 Pulmonary hypertension, unspecified: Secondary | ICD-10-CM

## 2020-09-05 DIAGNOSIS — M19011 Primary osteoarthritis, right shoulder: Secondary | ICD-10-CM | POA: Diagnosis not present

## 2020-09-05 DIAGNOSIS — M545 Low back pain, unspecified: Secondary | ICD-10-CM | POA: Diagnosis not present

## 2020-09-05 DIAGNOSIS — Z79891 Long term (current) use of opiate analgesic: Secondary | ICD-10-CM | POA: Diagnosis not present

## 2020-09-05 NOTE — Progress Notes (Signed)
Chronic Care Management Pharmacy Note  10/07/2020 Name:  Melanie Rollins MRN:  [id number] DOB:  1946/03/04  Subjective: Melanie Rollins is an 75 y.o. year old female who is a primary patient of Valerie Roys, DO.  The CCM team was consulted for assistance with disease management and care coordination needs.    Engaged with patient by telephone for follow up visit in response to provider referral for pharmacy case management and/or care coordination services.   Consent to Services:  The patient was given information about Chronic Care Management services, agreed to services, and gave verbal consent prior to initiation of services.  Please see initial visit note for detailed documentation.   Patient Care Team: Valerie Roys, DO as PCP - General (Family Medicine) Emmaline Kluver., MD (Rheumatology) Charlie Pitter, MD as Referring Physician (Pulmonary Disease) Teodoro Spray, MD as Consulting Physician (Cardiology) Lovell Sheehan, MD as Consulting Physician (Orthopedic Surgery) Jannet Mantis, MD (Dermatology) Vanita Ingles, RN as Case Manager (General Practice) Vladimir Faster, [facility] (Pharmacist)  Recent office visits: 09/13/20-Johnson -phone call- patient requests taper off sertraline 08/31/20-  Johnson(PCP)-blood work  Recent consult visits: 09/06/20-[facility] (pulm- ILD program)- PFT's no obstruction, normal FVC 09/04/20-S[place]ini (Pulm)- overnight oximetry - needs nocturnal O2 at 2L 08/12/20- [facility] (Pulmonology)- 2L fluid restrict, 1500 mg sodium restrict, CTD associtated PAH, 2l vis [place], Tyvaso increase to 6 inhalations and increase 1 per week rtc 2 months for 6 minute walk test  Hospital visits: None in previous 6 months  Objective:  Lab Results  Component Value Date   CREATININE 0.89 08/31/2020   BUN 16 08/31/2020   GFRNONAA 76 03/01/2020   GFRAA 87 03/01/2020   NA 136 08/31/2020   K 4.3 08/31/2020   CALCIUM 9.7 08/31/2020   CO2 18 (L)  08/31/2020   GLUCOSE 110 (H) 08/31/2020    No results found for: HGBA1C, FRUCTOSAMINE, GFR, MICROALBUR  Last diabetic Eye exam: No results found for: HMDIABEYEEXA  Last diabetic Foot exam: No results found for: HMDIABFOOTEX   Lab Results  Component Value Date   CHOL 189 08/31/2020   HDL 58 08/31/2020   LDLCALC 106 (H) 08/31/2020   TRIG 143 08/31/2020    Hepatic Function Latest Ref Rng & Units 08/31/2020 03/01/2020 09/10/2019  Total Protein 6.0 - 8.5 g/dL 7.0 6.7 8.0  Albumin 3.7 - 4.7 g/dL 4.4 4.2 4.5  AST 0 - 40 IU/L 53(H) 28 31  ALT 0 - 32 IU/L 48(H) 18 21  Alk Phosphatase 44 - 121 IU/L 88 61 58  Total Bilirubin 0.0 - 1.2 mg/dL 0.3 0.2 0.5    Lab Results  Component Value Date/Time   TSH 1.290 08/24/2019 09:34 AM   TSH 1.410 08/04/2018 04:52 PM    CBC Latest Ref Rng & Units 08/31/2020 03/01/2020 09/10/2019  WBC 3.4 - 10.8 x10E3/uL 11.0(H) 7.9 10.9(H)  Hemoglobin 11.1 - 15.9 g/dL 12.1 11.6 12.7  Hematocrit 34.0 - 46.6 % 37.5 36.9 39.7  Platelets 150 - 450 x10E3/uL 403 284 317    No results found for: VD25OH  Clinical ASCVD: Yes  The 10-year ASCVD risk score Mikey Bussing DC Jr., et al., 2013) is: 9.4%   Values used to calculate the score:     Age: 89 years     Sex: Female     Is Non-Hispanic African American: No     Diabetic: No     Tobacco smoker: No     Systolic Blood Pressure: 833  mmHg     Is BP treated: No     HDL Cholesterol: 58 mg/dL     Total Cholesterol: 189 mg/dL    Depression screen [facility] 2/9 08/31/2020 08/26/2020 03/01/2020  Decreased Interest _0 Down, Depressed, Hopeless 1 0 1  PHQ - 2 Score _1 Altered sleeping 0 - 1  Tired, decreased energy 2 - 3  Change in appetite 0 - 2  Feeling bad or failure about yourself  0 - 0  Trouble concentrating 0 - 0  Moving slowly or fidgety/restless 0 - 0  Suicidal thoughts 0 - 0  PHQ-9 Score 4 - 8  Difficult doing work/chores Not difficult at all - Not difficult at all  Some recent data might be hidden    Social  History   Tobacco Use  Smoking Status Never Smoker  Smokeless Tobacco Never Used   BP Readings from Last 3 Encounters:  08/31/20 116/75  03/01/20 104/69  10/15/19 (!) 117/56   Pulse Readings from Last 3 Encounters:  08/31/20 (!) 106  03/01/20 71  10/15/19 78   Wt Readings from Last 3 Encounters:  08/31/20 111 lb 9.6 oz (50.6 kg)  08/26/20 112 lb (50.8 kg)  03/01/20 119 lb 12.8 oz (54.3 kg)   BMI Readings from Last 3 Encounters:  08/31/20 19.46 kg/m  08/26/20 19.53 kg/m  03/01/20 20.89 kg/m    Assessment/Interventions: Review of patient past medical history, allergies, medications, health status, including review of consultants reports, laboratory and other test data, was performed as part of comprehensive evaluation and provision of chronic care management services.   SDOH:  (Social Determinants of Health) assessments and interventions performed: No  SDOH Screenings   Alcohol Screen: Not on file  Depression (PHQ2-9): Low Risk   . PHQ-2 Score: 4  Financial Resource Strain: Low Risk   . Difficulty of Paying Living Expenses: Not hard at all  Food Insecurity: No Food Insecurity  . Worried About Charity fundraiser in the Last Year: Never true  . Ran Out of Food in the Last Year: Never true  Housing: Not on file  Physical Activity: Inactive  . Days of Exercise per Week: 0 days  . Minutes of Exercise per Session: 0 min  Social Connections: Not on file  Stress: No Stress Concern Present  . Feeling of Stress : Not at all  Tobacco Use: Low Risk   . Smoking Tobacco Use: Never Smoker  . Smokeless Tobacco Use: Never Used  Transportation Needs: No Transportation Needs  . Lack of Transportation (Medical): No  . Lack of Transportation (Non-Medical): No     Immunization History  Administered Date(s) Administered  . Influenza, High Dose Seasonal PF 01/19/2018  . Influenza-Unspecified 02/17/2015, 03/09/2016, 02/07/2017, 02/08/2019, 03/14/2020  . PFIZER(Purple  Top)SARS-COV-2 Vaccination 06/30/2019, 07/21/2019, 01/17/2020  . Pneumococcal Conjugate-13 07/02/2014  . Pneumococcal Polysaccharide-23 07/04/2015  . Td 03/16/2009  . Zoster 07/06/2016  . Zoster Recombinat (Shingrix) 07/11/2016, 10/29/2016    Conditions to be addressed/monitored:  Hypertension, Hyperlipidemia, GERD, COPD, Depression, Anxiety and Osteoporosis pulmonary hypertension, Pulmonary Fibrosis, CAD, Reynaud's phenomenon  Care Plan : [place]  Updates made by Vladimir Faster, [place] since 10/07/2020 12:00 AM    Problem: PAH, Pulmolary fibrosis, Reynaud's, Hyperlipidemia, CAD, connective tissue disorder, ostearthritis, osteoporosis   Priority: High    Long-Range Goal: Disease Management   Start Date: 09/05/2020  This Visit's Progress: On track  Priority: High  Note:   Current Barriers:  .  Unable to achieve control of Pulmonary fibrosis, hyperlipidemai  . Unable to maintain control of blood pressure . Suboptimal therapeutic regimen for lipids, CAD  Pharmacist Clinical Goal(s):  Marland Kitchen Patient will achieve adherence to monitoring guidelines and medication adherence to achieve therapeutic efficacy . maintain control of osteoporosis, osteoarthritis as evidenced by lack of fracture, routine screenings . contact provider office for questions/concerns as evidenced notation of same in electronic health record through collaboration with PharmD and provider.   Interventions: . 1:1 collaboration with Valerie Roys, DO regarding development and update of comprehensive plan of care as evidenced by provider attestation and co-signature . Inter-disciplinary care team collaboration (see longitudinal plan of care) . Comprehensive medication review performed; medication list updated in electronic medical record     Pulmonary arterial hypertension (Goal: reduced symptom) -Not ideally controlled -Current treatment  . Tyvaso 10 breath qid . Furosemide 20 mg prn edema -Medications  previously tried: NA  -Patient reports tolerating Tyvaso okay. She is having some dziiness and low BP.  - She has not needed to take furosemide and thinks her breathing may be improving. -She follows with Dr. Gilles Chiquito Pulmonology and is increasing Tyvaso dosage by 1 breath per week    Hyperlipidemia Lab Results  Component Value Date/Time   LDLCALC 106 (H) 08/31/2020 11:31 AM   . Pharmacist Clinical Goal(s): o Over the next 60 days, patient will work with PharmD and providers to achieve LDL goal < 70 . Current regimen:  o Fish oil 1040m qd . Interventions: o Reviewed chart for cardiology note 09/05/20: cardiac cath 06/30/20 o Reviewed lab values. o Counseled on therapeutic dose of Fish oil for lipid benefit o Consider  addition of statin . Patient self care activities - Over the next 60 days, patient will: o Attend all scheduled appointments Osteopenia . Pharmacist Clinical Goal(s) o Over the next 60 days, patient will work with PharmD and providers to optimize therapy . Current regimen:  . Prolia 60 mg q 6 months . Calcium/vitamin D bid . Interventions: o Reviewed DEXA results o Provided diet and exercise counseling. . Patient self care activities - Over the next 60 days, patient will: o Continue medication as prescribed.  o Schedule follow up DEXA after June 10   She wants a THR but doubtful she will ever get clearance Medication management . Pharmacist Clinical Goal(s): o Over the next 60 days, patient will work with PharmD and providers to achieve optimal medication adherence . [facility] Interventions o Comprehensive medication review performed. o Continue current medication management strategy . Patient self care activities - Over the next 60 days, patient will: o Focus on medication adherence by fill dates o Take medications as prescribed o Report any questions or concerns to PharmD and/or provider(s)            Medication  Assistance: None required.  Patient affirms current coverage meets needs.  [place] [address]Phone: [phone]: [phone] [place] [place] [address]Phone: [phone]: [phone] Uses pill box? Yes Pt endorses 95% compliance  We discussed: Current pharmacy is preferred with insurance plan and patient is satisfied with pharmacy services Patient decided to: Continue current medication management strategy  Care Plan and Follow Up Patient Decision:  Patient agrees to  Care Plan and Follow-up.  Plan: Telephone follow up appointment with care management team member scheduled for:  1 month  Junita Push. Kenton Kingfisher PharmD, [place] [facility] [phone]

## 2020-09-06 DIAGNOSIS — J8410 Pulmonary fibrosis, unspecified: Secondary | ICD-10-CM | POA: Diagnosis not present

## 2020-09-06 DIAGNOSIS — Z79899 Other long term (current) drug therapy: Secondary | ICD-10-CM | POA: Diagnosis not present

## 2020-09-06 DIAGNOSIS — J9611 Chronic respiratory failure with hypoxia: Secondary | ICD-10-CM | POA: Diagnosis not present

## 2020-09-06 DIAGNOSIS — Z7952 Long term (current) use of systemic steroids: Secondary | ICD-10-CM | POA: Diagnosis not present

## 2020-09-06 DIAGNOSIS — R0602 Shortness of breath: Secondary | ICD-10-CM | POA: Diagnosis not present

## 2020-09-06 DIAGNOSIS — M359 Systemic involvement of connective tissue, unspecified: Secondary | ICD-10-CM | POA: Diagnosis not present

## 2020-09-06 DIAGNOSIS — I2721 Secondary pulmonary arterial hypertension: Secondary | ICD-10-CM | POA: Diagnosis not present

## 2020-09-13 ENCOUNTER — Ambulatory Visit: Payer: Self-pay | Admitting: General Practice

## 2020-09-13 ENCOUNTER — Telehealth: Payer: Medicare Other | Admitting: General Practice

## 2020-09-13 DIAGNOSIS — I2510 Atherosclerotic heart disease of native coronary artery without angina pectoris: Secondary | ICD-10-CM

## 2020-09-13 DIAGNOSIS — M25559 Pain in unspecified hip: Secondary | ICD-10-CM

## 2020-09-13 DIAGNOSIS — F419 Anxiety disorder, unspecified: Secondary | ICD-10-CM | POA: Diagnosis not present

## 2020-09-13 DIAGNOSIS — F32A Depression, unspecified: Secondary | ICD-10-CM | POA: Diagnosis not present

## 2020-09-13 DIAGNOSIS — M25511 Pain in right shoulder: Secondary | ICD-10-CM

## 2020-09-13 DIAGNOSIS — E782 Mixed hyperlipidemia: Secondary | ICD-10-CM

## 2020-09-13 DIAGNOSIS — J8410 Pulmonary fibrosis, unspecified: Secondary | ICD-10-CM

## 2020-09-13 NOTE — Chronic Care Management (AMB) (Signed)
Chronic Care Management   CCM RN Visit Note  09/13/2020 Name: Melanie Rollins MRN: [id number] DOB: [birth year]  Subjective: Melanie Rollins is a 75 y.o. year old female who is a primary care patient of Valerie Roys, DO. The care management team was consulted for assistance with disease management and care coordination needs.    Engaged with patient by telephone for follow up visit in response to provider referral for case management and/or care coordination services.   Consent to Services:  The patient was given information about Chronic Care Management services, agreed to services, and gave verbal consent prior to initiation of services.  Please see initial visit note for detailed documentation.   Patient agreed to services and verbal consent obtained.   Assessment: Review of patient past medical history, allergies, medications, health status, including review of consultants reports, laboratory and other test data, was performed as part of comprehensive evaluation and provision of chronic care management services.   SDOH (Social Determinants of Health) assessments and interventions performed:    CCM Care Plan  Allergies  Allergen Reactions  . Imuran [Azathioprine] Nausea Only and Other (See Comments)    Cold chills and bruise like welps    Outpatient Encounter Medications as of 09/13/2020  Medication Sig Note  . ACETAMINOPHEN PO Take 1,300 mg by mouth in the morning and at bedtime.   . Acetylcysteine (NAC) 600 MG CAPS Take 1 capsule (600 mg total) by mouth 2 (two) times daily. (Patient taking differently: Take 1,200 mg by mouth daily.)   . acyclovir ointment (ZOVIRAX) 5 % Apply 1 application topically every 3 (three) hours. 09/01/2019: Using PRN cold sores   . baclofen (LIORESAL) 10 MG tablet Take 1 tablet (10 mg total) by mouth at bedtime as needed for muscle spasms. 09/01/2019: Using ~couple times weekly  . BIOTIN PO Take 5,000 mcg by mouth daily.   . calcium carbonate (OSCAL)  1500 (600 Ca) MG TABS tablet Take 600 mg of elemental calcium by mouth 2 (two) times daily with a meal.   . Cholecalciferol (VITAMIN D) 2000 units CAPS Take 2,000 Units by mouth daily.   Marland Kitchen CINNAMON PO Take 1,000 mg by mouth daily. (Patient not taking: Reported on 09/05/2020)   . denosumab (PROLIA) 60 MG/ML SOSY injection Inject into the skin.   . furosemide (LASIX) 20 MG tablet Take 20 mg by mouth as needed.   . hydroxychloroquine (PLAQUENIL) 200 MG tablet Take 2 tablets (400 mg total) by mouth daily. (Patient taking differently: Take 200 mg by mouth daily.)   . Multiple Vitamin (MULTIVITAMIN) tablet Take 1 tablet by mouth daily. (Patient not taking: Reported on 09/05/2020)   . mupirocin ointment (BACTROBAN) 2 % Place 1 application into the nose 2 (two) times daily. (Patient not taking: Reported on 09/05/2020)   . mycophenolate (CELLCEPT) 500 MG tablet Take 2 tablets (1,000 mg total) by mouth 2 (two) times daily.   Marland Kitchen NIFEdipine (PROCARDIA-XL/NIFEDICAL-XL) 30 MG 24 hr tablet Take 30 mg by mouth daily.   . Omega-3 Fatty Acids (FISH OIL) 1000 MG CAPS Take 1,000 mg by mouth daily. (Patient not taking: Reported on 09/05/2020)   . omeprazole (PRILOSEC) 20 MG capsule Take 1 capsule (20 mg total) by mouth daily.   . OXYGEN 2 L/min by Continuous infusion (non-IV) route daily.   . permethrin (ELIMITE) 5 % cream  (Patient not taking: Reported on 09/05/2020) 09/01/2019: For rosacea  . predniSONE (DELTASONE) 2.5 MG tablet Take 7.5 mg by mouth daily with breakfast.   .  pyridoxine (B-6) 100 MG tablet Take by mouth.   . sertraline (ZOLOFT) 50 MG tablet Take 1 tablet (50 mg total) by mouth daily.   . traMADol (ULTRAM) 50 MG tablet Take 50 mg by mouth every 6 (six) hours as needed. 09/01/2019: ~couple times weekly  . Treprostinil (TYVASO) 0.6 MG/ML SOLN Inhale into the lungs.   Marland Kitchen zolpidem (AMBIEN) 10 MG tablet Take 0.5 tablets (5 mg total) by mouth at bedtime. (Patient taking differently: Take 5 mg by mouth at bedtime.  Takes very seldom)    No facility-administered encounter medications on file as of 09/13/2020.    Patient Active Problem List   Diagnosis Date Noted  . Pulmonary hypertension ([place]) 08/31/2020  . Colonic thickening   . Hemorrhoids   . Gastric polyp   . Iron deficiency anemia due to chronic blood loss 08/24/2019  . Chronic constipation 03/21/2018  . GERD without esophagitis 03/21/2018  . Age-related osteoporosis with current pathological fracture 03/17/2018  . Chronic respiratory failure with hypoxia ([place]) 03/17/2018  . 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 Elevated liver enzymes 12/13/2017  . Coronary artery disease involving native coronary artery of native heart without angina pectoris 10/23/2017  . SOB (shortness of breath) 10/23/2017  . Syncope, near 10/10/2017  . Advanced care planning/counseling discussion 07/06/2016  . 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 Pulmonary fibrosis ([place]) 03/14/2015  . Anxiety and depression 03/14/2015  . Hyperlipidemia 03/14/2015  . Insomnia 03/14/2015  . Osteoarthritis 03/14/2015  . Atrophic vaginitis 03/14/2015  . Connective tissue disease ([place]) 01/27/2014    Conditions to be addressed/monitored:CAD, HLD, Anxiety, Depression, Pulmonary Disease and Chronic pain to right hip and shoulder  Care Plan : RNCM: Coronary Artery Disease (Adult)  Updates made by Vanita Ingles since 09/13/2020 12:00 AM    Problem: RNCM: Disease Progression (Coronary Artery Disease)   Priority: Medium    Goal: RNCM: Disease Progression Prevented or Minimized   Priority: Medium  Note:   Current Barriers:  Marland Kitchen Knowledge Deficits related to management of CAD . Chronic Disease Management support and education needs related to CAD and subsequent underlying chronic conditions  . Unable to independently manage CAD . Does not contact provider office for questions/concerns  Nurse Case Manager Clinical Goal(s):  Marland Kitchen Over the next 120 days, patient  will verbalize understanding of plan for CAD . Over the next 120 days, patient will work with [facility] and CCM team to address needs related to CAD . Over the next 120 days, patient will attend all scheduled medical appointments: 03-02-2021 at 11 am  Interventions:  . 1:1 collaboration with Valerie Roys, DO regarding development and update of comprehensive plan of care as evidenced by provider attestation and co-signature . Inter-disciplinary care team collaboration (see longitudinal plan of care) . Evaluation of current treatment plan related to CAD and patient's adherence to plan as established by provider. 09-13-2020: The patient saw pcp on 08-31-2020 and is doing well with her CAD and HLD management. Will continue to monitor.  . Advised patient to call the office for changes in condition or new questions or concerns  . Provided education to patient re: heart healthy diet and maintaining healthy heart practices. 07-19-2020: the patient is compliant with heart healthy diet. She is having multiple issues right now but she is maintaining her health as best as possible  . Reviewed medications with patient and discussed compliance. 4-26--2022: The patient endorses compliance with medications regimen.  . Discussed plans with patient for  ongoing care management follow up and provided patient with direct contact information for care management team . Reviewed scheduled/upcoming provider appointments including: 03-02-2021 at 11 am  Patient Goals/Self-Care Activities Over the next 120 days, patient will:  - Patient will self administer medications as prescribed Patient will attend all scheduled provider appointments Patient will call pharmacy for medication refills Patient will call provider office for new concerns or questions Patient will work with BSW to address care coordination needs and will continue to work with the clinical team to address health care and disease management related needs.   -  barriers to treatment adherence reviewed and addressed - communicable disease prevention promoted - difficulty of making life-long changes acknowledged - functional limitation screening reviewed - healthy lifestyle promoted - medication-adherence assessment completed - medication side effects managed - self-awareness of signs/symptoms of worsening disease encouraged  Follow Up Plan: Telephone follow up appointment with care management team member scheduled for: [id number] at 2 am       Care Plan : RNCM: Chronic Pain (Adult)- right hip pain  Updates made by Vanita Ingles since 09/13/2020 12:00 AM    Problem: RNCM: Pain Management Plan (Chronic Pain)- right hip   Priority: Medium    Long-Range Goal: RNCM: Pain Management Plan Developed- Right Hip Pain   Priority: Medium  Note:   Current Barriers:  Marland Kitchen Knowledge Deficits related to managing acute/chronic pain . Non-adherence to scheduled provider appointments . Non-adherence to prescribed medication regimen . Difficulty obtaining medications . Chronic Disease Management support and education needs related to chronic pain . Unable to independently manage chronic right hip pain . Does not contact provider office for questions/concerns  Nurse Case Manager Clinical Goal(s):  Marland Kitchen Over the next 120 days, patient will verbalize understanding of plan for managing pain . Over the next 120 days, patient will meet with RN Care Manager to address chronic pain and discomfort  . Over the next 120 days, patient will attend all scheduled medical appointments: 03-02-2021 . Over the next  120 days patient will demonstrate use of different relaxation  skills and/or diversional activities to assist with pain reduction (distraction, imagery, relaxation, massage, acupressure, TENS, heat, and cold application . Over the next  120  days patient will report pain at a level less than 3 to 4 on a 10-10 rating scale . Over the next   120 days patient will use  pharmacological and nonpharmacological pain relief strategies . Over the next 120  days patient will verbalize acceptable level of pain relief and ability to engage in desired activities . Over the next  120 days patient will engage in desired activities without an increase in pain level  Interventions:  . - deep breathing, relaxation and mindfulness use promoted . - effectiveness of pharmacologic therapy monitored.  07-19-2020: The patient had an injection last week in her hip because she cannot have surgery. The patient states that it has helped her hip- follows up next week with the provider for further evaluation and treatment options. 09-13-2020: Is going to start working with PT BID. States her pain varies from day to day. Denies any acute pain today.  . - pain assessed- patient denies pain in her hip today but is having issues with shoulder pain now. She states she had a couple of good days where she felt really good and was cleaning out a closet and she feels this caused her to pull something in her shoulder. She is going to discuss with the provider  next week when she has follow up about her hip. 09-13-2020: The patient states her pain varies from day to day. Starting PT on 09-14-2020. Feels this will help her hip and her shoulder. Denies any acute findings.  . - pain treatment goals reviewed- 09-13-2020: The patient wants to have relief from the pain she is experiencing. Working with the provider to optimize her ability to function and do ADLs/IADLS to the best of her ability  . Evaluation of current treatment plan related to pain in right hip and patient's adherence to plan as established by provider. 07-19-2020: The patient reports the shot she got was very helpful for relief of the hip pain that she had. Will discuss new shoulder pain she is experiencing at her follow up visit. 09-13-2020: The patient is compliant with current plan of care. Is hopeful the PT BID with help with her shoulder and hip. Has  had PT before with good results.  . Advised patient to call the provider for worsening pain and discomfort that is unresolved by current pain management regimen . Provided education to patient re: safety and use of DME as needed. 09-13-2020: Denies falls, states she is being safe.  . Reviewed medications with patient and discussed compliance. The patient is using Tylenol and Ultram to manage pain and discomfort  . Collaborated with CCM team regarding effective management of pain and review of medications to treat pain. The patient also working with the specialist  . Discussed plans with patient for ongoing care management follow up and provided patient with direct contact information for care management team . Allow patient to maintain a diary of pain ratings, timing, precipitating events, medications, treatments, and what works best to relieve pain,  . Refer to support groups and self-help groups . Educate patient about the use of pharmacological interventions for pain management- antianxiety, antidepressants, NSAIDS, opioid analgesics,  . Explain the importance of lifestyle modifications to effective pain management   Patient Goals/Self Care Activities:  . Patient verbalizes understanding of plan to manage pain and discomfort . Self-administers medications as prescribed . Attends all scheduled provider appointments . Calls pharmacy for medication refills . Calls provider office for new concerns or questions . - mutually acceptable comfort goal set . - pain assessed . - pain management plan developed . - pain treatment goals reviewed . - patient response to treatment assessed . - sharing of pain management plan with teachers and other caregivers encouraged  Follow Up Plan: Telephone follow up appointment with care management team member scheduled for: 11-15-2020 at 14 am     Care Plan : RNCM: Depression (Adult)  Updates made by Vanita Ingles since 09/13/2020 12:00 AM    Problem: RNCM:  Depression Identification (Depression)   Priority: Medium    Long-Range Goal: RNCM: Axniety and Depressive Symptoms Identified   Priority: Medium  Note:   Current Barriers:  Marland Kitchen Knowledge Deficits related to anxiety and depression exacerbated by chronic conditions . Care Coordination needs related to management of anxiety and depression in a patient with multiple chronic conditions and chronic diarrhea over the last several months getting worse . Chronic Disease Management support and education needs related to anxiety and depression in patient with chronic conditions and chronic diarrhea over the last several months.  . Unable to independently manage anxiety and depression as evidence of chronic conditions that exacerbate conditions . Lacks social connections . Does not contact provider office for questions/concerns  Nurse Case Manager Clinical Goal(s):  Marland Kitchen Over the  next 120 days, patient will verbalize understanding of plan for management of anxiety and depression and chronic diarrhea  . Over the next 120 days, patient will work with [facility], [place] team, and pcp to address needs related to anxiety and depression caused by chronic diarrhea and other chronic conditions . Over the next 120 days, patient will demonstrate a decrease in chronic diarrhea  exacerbations as evidenced by decreased episodes of diarrhea, depression and anxiety. . Over the next 120 days, patient will attend all scheduled medical appointments: 03-02-2021 . Over the next 120 days, patient will demonstrate improved adherence to prescribed treatment plan for Anxiety and depression  as evidenced bydecreased episodes of diarrhea and stabilized chronic conditions . Over the next 120 days, patient will demonstrate improved health management independence as evidenced byimproved symptoms and ability to effectively manage care  . Over the next 120 days, patient will work with CM team pharmacist to review medications that may be contributing  to chronic diarrhea . Over the next 120 days, patient will work with CM clinical social worker to manage depression and anxiety.   Interventions:  . 1:1 collaboration with Valerie Roys, DO regarding development and update of comprehensive plan of care as evidenced by provider attestation and co-signature . Inter-disciplinary care team collaboration (see longitudinal plan of care) . Evaluation of current treatment plan related to Depression and anxiety  and patient's adherence to plan as established by provider. 07-19-2020: The patient is frustrated with all of her health problems at this time but she is doing what she can to remain positive. Denies any acute issues with depression or anxiety. 09-13-2020: The patient is feeling better and is at a good place today. She states she is hopeful the PT is going to help her pain in her shoulder and hip. She also states her breathing is better. She has seen pulmonary provider recently and received a good report. Denies any issues with depression or anxiety.  . Advised patient to call the provider for change or worsening sx and sx of depression, anxiety and chronic diarrhea . Provided education to patient re: staying hydrated and eating balanced diet. To write down factors that may contribute to exacerbation of diarrhea.  07-19-2020: Denies issues at this time with diarrhea. 09-13-2020: The patient denies any issues from diarrhea, states it has resolved.  . Reviewed medications with patient and discussed compliance and possible medications that could contribute to diarrhea. 07-19-2020: The patient has been followed by pharm D. Denies any concerns related to medications at this time. 09-13-2020: Has no new issues related to medications.  [name] with CCM team pharmacist  regarding medications that could cause diarrhea. 07-19-2020: has been in contact with the pharmacist. 09-13-2020: Completed. Diarrhea has resolved at this time.  . Discussed plans with patient for  ongoing care management follow up and provided patient with direct contact information for care management team . Reviewed scheduled/upcoming provider appointments including: 03-02-2021 at 11 am . Social Work referral for ongoing support for depression and anxiety. 09-13-2020: Is working with the LCSW- has no new concerns at this time.  Marland Kitchen Pharmacy referral for review of medications for possible medication that causes diarrhea. 09-13-2020: The patient has worked with the patient and diarrhea is resolved at this time.   Patient Goals/Self-Care Activities Over the next 120 days, patient will:  - Patient will self administer medications as prescribed Patient will attend all scheduled provider appointments Patient will call pharmacy for medication refills Patient will call provider office for  new concerns or questions Patient will work with BSW to address care coordination needs and will continue to work with the clinical team to address health care and disease management related needs.    - anxiety screen reviewed - depression screen reviewed - medication list reviewed  Follow Up Plan: Telephone follow up appointment with care management team member scheduled for: 11-15-2020 1030 am       Care Plan : RNCM: HLD Disease Management  Updates made by Vanita Ingles since 09/13/2020 12:00 AM    Problem: [place] Promotion or Disease Self-Management (General Plan of Care)   Priority: Medium    Long-Range Goal: RNCM: HLDSelf-Management Plan Developed   Priority: Medium  Note:   Current Barriers:  . Poorly controlled hyperlipidemia, complicated by CAD, pulmonary fibrosis, possible new diagnosis of Pulmonary HTN . Current antihyperlipidemic regimen: Omega 3 fatty acids 1000 mg daily . Most recent lipid panel:  . Lab Results .  Component . Value . Date .   Marland Kitchen CHOL . 189 . 08/31/2020 .   Marland Kitchen CHOL . 213 (H) . 03/01/2020 .   Marland Kitchen CHOL . 216 (H) . 08/24/2019 .   Marland Kitchen Lab Results .  Component . Value  . Date .   Marland Kitchen HDL . 58 . 08/31/2020 .   Marland Kitchen HDL . 62 . 03/01/2020 .   Marland Kitchen HDL . 67 . 08/24/2019 .   Marland Kitchen Lab Results .  Component . Value . Date .   Marland Kitchen [place] . 106 (H) . 08/31/2020 .   Marland Kitchen [place] . 118 (H) . 03/01/2020 .   Marland Kitchen [place] . 119 (H) . 08/24/2019 .   Marland Kitchen Lab Results .  Component . Value . Date .   Marland Kitchen TRIG . 143 . 08/31/2020 .   Marland Kitchen TRIG . 193 (H) . 03/01/2020 .   Marland Kitchen TRIG . 174 (H) . 08/24/2019 .   Marland Kitchen No results found for: CHOLHDL . No results found for: LDLDIRECT  . ASCVD risk enhancing conditions: age >56, CAD, pulmonary fibrosis, chronic pain . Unable to independently manage HLD . Does not contact provider office for questions/concerns  RN Care Manager Clinical Goal(s):  Marland Kitchen Over the next 120 days, patient will work with Consulting civil engineer, providers, and care team towards execution of optimized self-health management plan . Over the next 120 days, patient will verbalize understanding of plan for HLD management  . Over the next 120 days, patient will attend all scheduled medical appointments: 03-02-2021  Interventions: Marland Kitchen Medication review performed; medication list updated in electronic medical record.  Bertram Savin care team collaboration (see longitudinal plan of care) . Referred to pharmacy team for assistance with HLD medication management . Evaluation of current treatment plan related to HLD and patient's adherence to plan as established by provider. 09-13-2020: Under good control at this time. Saw pcp on 08-31-2020. No changes in the current regimen for HLD.  . Advised patient to call the office for changes in condition or questions . Provided education to patient re: staying hydrated and eating a balanced meal . Reviewed medications with patient and discussed compliance  . Discussed plans with patient for ongoing care management follow up and provided patient with direct contact information for care management team . Reviewed scheduled/upcoming provider appointments  including: 03-02-2021 at 11 am. The patient has several upcoming appointments at this time due to changes in pulmonary status   Patient Goals/Self-Care Activities: . Over the next 120 days, patient will:   -  call for medicine refill 2 or 3 days before it runs out - call if I am sick and can't take my medicine - keep a list of all the medicines I take; vitamins and herbals too - learn to read medicine labels - use a pillbox to sort medicine - use an alarm clock or phone to remind me to take my medicine - drink 6 to 8 glasses of water each day - eat 3 to 5 servings of fruits and vegetables each day - eat 5 or 6 small meals each day - limit fast food meals to no more than 1 per week - manage portion size - prepare main meal at home 3 to 5 days each week - read food labels for fat, fiber, carbohydrates and portion size - set a realistic goal - be open to making changes - I can manage, know and watch for signs of a heart attack - if I have chest pain, call for help - learn about small changes that will make a big difference - learn my personal risk factors - barriers to meeting goals identified - choices provided - collaboration with team encouraged - decision-making supported - health risks reviewed - problem-solving facilitated - questions answered - reassurance provided - resources needed to meet goals identified - self-reliance encouraged - verbalization of feelings encouraged   Follow Up Plan: Telephone follow up appointment with care management team member scheduled for: 11-15-2020 1030 am     Care Plan : RNCM: Pulmonary Fibrosis/Shortness of Breath Plan of Care  Updates made by Vanita Ingles since 09/13/2020 12:00 AM    Problem: RNCM: Pulmonary Fibrosis and Shortness of Breath   Priority: Medium    Long-Range Goal: RNCM: Pulmonary Fibrosis and Shortness of breath Disease Progression Minimized or Managed   Priority: Medium  Note:   Current Barriers:  Marland Kitchen Knowledge  deficits related to basic understanding of Pulmonary Fibrosis and shortness of Breath disease process . Knowledge deficits related to basic Pulmonary Fibrosis self care/management . Knowledge deficit related to importance of energy conservation . Unable to independently manage Pulmonary Fibrosis as evidence of being oxygen dependent and worsening shortness of breath. . Does not contact provider office for questions/concerns  Case Manager Clinical Goal(s):  Over the next 120 days, patient will be able to verbalize understanding of Pulmonary Fibrosis action plan and when to seek appropriate levels of medical care  Over the next 120 days, patient will engage in lite exercise as tolerated to build/regain stamina and strength and reduce shortness of breath through activity tolerance  Over the next 120 days, patient will verbalize basic understanding of Pulmonary Fibrosis disease process and self care activities  Over the next 120 days, patient will not be hospitalized for Pulmonary fibrosis exacerbation  Interventions:   UNABLE to independently: manage pulmonary fibrosis   Provided patient with basic written and verbal pulmonary fibrosis  education on self care/management/and exacerbation prevention   Provided patient with Pulmonary fibrosis  action plan and reinforced importance of daily self assessment. 09-13-2020: Saw pulmonary provider on 09-06-2020 and did walk test. Is doing well with her breathing at this time. Denies any new concerns with PF.   Provided written and verbal instructions on pursed lip breathing and utilized returned demonstration as teach back  Provided instruction about proper use of medications used for management of pulmonary fibrosis including inhalers  Advised patient to self assesses pulmonary fibrosis action plan zone and make appointment with provider if in the yellow zone for 48  hours without improvement.  Provided patient with education about the role of exercise  in the management of Pulmonary fibrosis   Provided education about and advised patient to utilize infection prevention strategies to reduce risk of respiratory infection   Evaluation of recent ECHO and follow up.  The patient states that she is now dealing with pulmonary HTN and they have started her on a new medication: Tyvaso and she has been on that for one week. She will follow up with the providers at [facility] closely. She can not tell of any changes right now as she has not been on the medication long. She is using her oxygen and her breathing is about the same. She is ready for her chronic conditions to balance out. Will continue to monitor for changes. 09-13-2020: Is doing well with Tyvaso and states she has adjusted. Is happy right now that she is not having any exacerbations with PF or Pulmonary HTN. Patient Goals/Self-Care Activities:  . - activity or exercise based on tolerance encouraged . - barriers to treatment managed . - breathing techniques encouraged . - communicable disease prevention promoted . - medication-adherence assessment completed . - quality of sleep assessed . - rescue (action) plan reviewed . - screen for functional limitations completed and reviewed . - self-awareness of symptom triggers encouraged Follow Up Plan: Telephone follow up appointment with care management team member scheduled for:  6-28-2022at 1030 am     Plan:Telephone follow up appointment with care management team member scheduled for:  11-15-2020 at 53 am   [employer], MSN, [facility] Mobile: [phone]

## 2020-09-13 NOTE — Patient Instructions (Signed)
Visit Information  PATIENT GOALS: Patient Care Plan: RNCM: Coronary Artery Disease (Adult)    Problem Identified: RNCM: Disease Progression (Coronary Artery Disease)   Priority: Medium    Goal: RNCM: Disease Progression Prevented or Minimized   Priority: Medium  Note:   Current Barriers:  Marland Kitchen Knowledge Deficits related to management of CAD . Chronic Disease Management support and education needs related to CAD and subsequent underlying chronic conditions  . Unable to independently manage CAD . Does not contact provider office for questions/concerns  Nurse Case Manager Clinical Goal(s):  Marland Kitchen Over the next 120 days, patient will verbalize understanding of plan for CAD . Over the next 120 days, patient will work with [facility] and CCM team to address needs related to CAD . Over the next 120 days, patient will attend all scheduled medical appointments: 03-02-2021 at 11 am  Interventions:  . 1:1 collaboration with Valerie Roys, DO regarding development and update of comprehensive plan of care as evidenced by provider attestation and co-signature . Inter-disciplinary care team collaboration (see longitudinal plan of care) . Evaluation of current treatment plan related to CAD and patient's adherence to plan as established by provider. 09-13-2020: The patient saw pcp on 08-31-2020 and is doing well with her CAD and HLD management. Will continue to monitor.  . Advised patient to call the office for changes in condition or new questions or concerns  . Provided education to patient re: heart healthy diet and maintaining healthy heart practices. 07-19-2020: the patient is compliant with heart healthy diet. She is having multiple issues right now but she is maintaining her health as best as possible  . Reviewed medications with patient and discussed compliance. 4-26--2022: The patient endorses compliance with medications regimen.  . Discussed plans with patient for ongoing care management follow up and  provided patient with direct contact information for care management team . Reviewed scheduled/upcoming provider appointments including: 03-02-2021 at 11 am  Patient Goals/Self-Care Activities Over the next 120 days, patient will:  - Patient will self administer medications as prescribed Patient will attend all scheduled provider appointments Patient will call pharmacy for medication refills Patient will call provider office for new concerns or questions Patient will work with BSW to address care coordination needs and will continue to work with the clinical team to address health care and disease management related needs.   - barriers to treatment adherence reviewed and addressed - communicable disease prevention promoted - difficulty of making life-long changes acknowledged - functional limitation screening reviewed - healthy lifestyle promoted - medication-adherence assessment completed - medication side effects managed - self-awareness of signs/symptoms of worsening disease encouraged  Follow Up Plan: Telephone follow up appointment with care management team member scheduled for: 6-28-20200 at 1030 am       Task: Alleviate Barriers to Coronary Artery Disease Therapy   Note:   Care Management Activities:    - barriers to treatment adherence reviewed and addressed - communicable disease prevention promoted - difficulty of making life-long changes acknowledged - functional limitation screening reviewed - healthy lifestyle promoted - medication-adherence assessment completed - medication side effects managed - self-awareness of signs/symptoms of worsening disease encouraged       Patient Care Plan: RNCM: Chronic Pain (Adult)- right hip pain    Problem Identified: RNCM: Pain Management Plan (Chronic Pain)- right hip   Priority: Medium    Long-Range Goal: RNCM: Pain Management Plan Developed- Right Hip Pain   Priority: Medium  Note:   Current Barriers:  Marland Kitchen Knowledge  Deficits related to managing acute/chronic pain . Non-adherence to scheduled provider appointments . Non-adherence to prescribed medication regimen . Difficulty obtaining medications . Chronic Disease Management support and education needs related to chronic pain . Unable to independently manage chronic right hip pain . Does not contact provider office for questions/concerns  Nurse Case Manager Clinical Goal(s):  Marland Kitchen Over the next 120 days, patient will verbalize understanding of plan for managing pain . Over the next 120 days, patient will meet with RN Care Manager to address chronic pain and discomfort  . Over the next 120 days, patient will attend all scheduled medical appointments: 03-02-2021 . Over the next  120 days patient will demonstrate use of different relaxation  skills and/or diversional activities to assist with pain reduction (distraction, imagery, relaxation, massage, acupressure, TENS, heat, and cold application . Over the next  120  days patient will report pain at a level less than 3 to 4 on a 10-10 rating scale . Over the next   120 days patient will use pharmacological and nonpharmacological pain relief strategies . Over the next 120  days patient will verbalize acceptable level of pain relief and ability to engage in desired activities . Over the next  120 days patient will engage in desired activities without an increase in pain level  Interventions:  . - deep breathing, relaxation and mindfulness use promoted . - effectiveness of pharmacologic therapy monitored.  07-19-2020: The patient had an injection last week in her hip because she cannot have surgery. The patient states that it has helped her hip- follows up next week with the provider for further evaluation and treatment options. 09-13-2020: Is going to start working with PT BID. States her pain varies from day to day. Denies any acute pain today.  . - pain assessed- patient denies pain in her hip today but is having  issues with shoulder pain now. She states she had a couple of good days where she felt really good and was cleaning out a closet and she feels this caused her to pull something in her shoulder. She is going to discuss with the provider next week when she has follow up about her hip. 09-13-2020: The patient states her pain varies from day to day. Starting PT on 09-14-2020. Feels this will help her hip and her shoulder. Denies any acute findings.  . - pain treatment goals reviewed- 09-13-2020: The patient wants to have relief from the pain she is experiencing. Working with the provider to optimize her ability to function and do ADLs/IADLS to the best of her ability  . Evaluation of current treatment plan related to pain in right hip and patient's adherence to plan as established by provider. 07-19-2020: The patient reports the shot she got was very helpful for relief of the hip pain that she had. Will discuss new shoulder pain she is experiencing at her follow up visit. 09-13-2020: The patient is compliant with current plan of care. Is hopeful the PT BID with help with her shoulder and hip. Has had PT before with good results.  . Advised patient to call the provider for worsening pain and discomfort that is unresolved by current pain management regimen . Provided education to patient re: safety and use of DME as needed. 09-13-2020: Denies falls, states she is being safe.  . Reviewed medications with patient and discussed compliance. The patient is using Tylenol and Ultram to manage pain and discomfort  . Collaborated with CCM team regarding effective management of pain  and review of medications to treat pain. The patient also working with the specialist  . Discussed plans with patient for ongoing care management follow up and provided patient with direct contact information for care management team . Allow patient to maintain a diary of pain ratings, timing, precipitating events, medications, treatments, and what  works best to relieve pain,  . Refer to support groups and self-help groups . Educate patient about the use of pharmacological interventions for pain management- antianxiety, antidepressants, NSAIDS, opioid analgesics,  . Explain the importance of lifestyle modifications to effective pain management   Patient Goals/Self Care Activities:  . Patient verbalizes understanding of plan to manage pain and discomfort . Self-administers medications as prescribed . Attends all scheduled provider appointments . Calls pharmacy for medication refills . Calls provider office for new concerns or questions . - mutually acceptable comfort goal set . - pain assessed . - pain management plan developed . - pain treatment goals reviewed . - patient response to treatment assessed . - sharing of pain management plan with teachers and other caregivers encouraged  Follow Up Plan: Telephone follow up appointment with care management team member scheduled for: 11-15-2020 at 29 am     Task: RNCM: Partner to Develop Chronic Pain Management Plan   Note:   Care Management Activities:    - mutually acceptable comfort goal set - pain assessed - pain management plan developed - pain treatment goals reviewed - patient response to treatment assessed - sharing of pain management plan with teachers and other caregivers encouraged      Patient Care Plan: RNCM: Depression (Adult)    Problem Identified: RNCM: Depression Identification (Depression)   Priority: Medium    Long-Range Goal: RNCM: Axniety and Depressive Symptoms Identified   Priority: Medium  Note:   Current Barriers:  Marland Kitchen Knowledge Deficits related to anxiety and depression exacerbated by chronic conditions . Care Coordination needs related to management of anxiety and depression in a patient with multiple chronic conditions and chronic diarrhea over the last several months getting worse . Chronic Disease Management support and education needs related  to anxiety and depression in patient with chronic conditions and chronic diarrhea over the last several months.  . Unable to independently manage anxiety and depression as evidence of chronic conditions that exacerbate conditions . Lacks social connections . Does not contact provider office for questions/concerns  Nurse Case Manager Clinical Goal(s):  Marland Kitchen Over the next 120 days, patient will verbalize understanding of plan for management of anxiety and depression and chronic diarrhea  . Over the next 120 days, patient will work with [facility] [place] team, and pcp to address needs related to anxiety and depression caused by chronic diarrhea and other chronic conditions . Over the next 120 days, patient will demonstrate a decrease in chronic diarrhea  exacerbations as evidenced by decreased episodes of diarrhea, depression and anxiety. . Over the next 120 days, patient will attend all scheduled medical appointments: 03-02-2021 . Over the next 120 days, patient will demonstrate improved adherence to prescribed treatment plan for Anxiety and depression  as evidenced bydecreased episodes of diarrhea and stabilized chronic conditions . Over the next 120 days, patient will demonstrate improved health management independence as evidenced byimproved symptoms and ability to effectively manage care  . Over the next 120 days, patient will work with CM team pharmacist to review medications that may be contributing to chronic diarrhea . Over the next 120 days, patient will work with CM clinical social worker to manage  depression and anxiety.   Interventions:  . 1:1 collaboration with Valerie Roys, DO regarding development and update of comprehensive plan of care as evidenced by provider attestation and co-signature . Inter-disciplinary care team collaboration (see longitudinal plan of care) . Evaluation of current treatment plan related to Depression and anxiety  and patient's adherence to plan as established by  provider. 07-19-2020: The patient is frustrated with all of her health problems at this time but she is doing what she can to remain positive. Denies any acute issues with depression or anxiety. 09-13-2020: The patient is feeling better and is at a good place today. She states she is hopeful the PT is going to help her pain in her shoulder and hip. She also states her breathing is better. She has seen pulmonary provider recently and received a good report. Denies any issues with depression or anxiety.  . Advised patient to call the provider for change or worsening sx and sx of depression, anxiety and chronic diarrhea . Provided education to patient re: staying hydrated and eating balanced diet. To write down factors that may contribute to exacerbation of diarrhea.  07-19-2020: Denies issues at this time with diarrhea. 09-13-2020: The patient denies any issues from diarrhea, states it has resolved.  . Reviewed medications with patient and discussed compliance and possible medications that could contribute to diarrhea. 07-19-2020: The patient has been followed by pharm D. Denies any concerns related to medications at this time. 09-13-2020: Has no new issues related to medications.  [name] with CCM team pharmacist  regarding medications that could cause diarrhea. 07-19-2020: has been in contact with the pharmacist. 09-13-2020: Completed. Diarrhea has resolved at this time.  . Discussed plans with patient for ongoing care management follow up and provided patient with direct contact information for care management team . Reviewed scheduled/upcoming provider appointments including: 03-02-2021 at 11 am . Social Work referral for ongoing support for depression and anxiety. 09-13-2020: Is working with the LCSW- has no new concerns at this time.  Marland Kitchen Pharmacy referral for review of medications for possible medication that causes diarrhea. 09-13-2020: The patient has worked with the patient and diarrhea is resolved at this  time.   Patient Goals/Self-Care Activities Over the next 120 days, patient will:  - Patient will self administer medications as prescribed Patient will attend all scheduled provider appointments Patient will call pharmacy for medication refills Patient will call provider office for new concerns or questions Patient will work with BSW to address care coordination needs and will continue to work with the clinical team to address health care and disease management related needs.    - anxiety screen reviewed - depression screen reviewed - medication list reviewed  Follow Up Plan: Telephone follow up appointment with care management team member scheduled for: 11-15-2020 1030 am       Task: RNCM: Anxiety and Depression/Identify Depressive Symptoms and Facilitate Treatment   Note:   Care Management Activities:    - anxiety screen reviewed - depression screen reviewed - medication list reviewed       Patient Care Plan: RNCM: HLD Disease Management    Problem Identified: [place] Promotion or Disease Self-Management (General Plan of Care)   Priority: Medium    Long-Range Goal: RNCM: HLDSelf-Management Plan Developed   Priority: Medium  Note:   Current Barriers:  . Poorly controlled hyperlipidemia, complicated by CAD, pulmonary fibrosis, possible new diagnosis of Pulmonary HTN . Current antihyperlipidemic regimen: Omega 3 fatty acids 1000 mg daily .  Most recent lipid panel:  . Lab Results .  Component . Value . Date .   Marland Kitchen CHOL . 189 . 08/31/2020 .   Marland Kitchen CHOL . 213 (H) . 03/01/2020 .   Marland Kitchen CHOL . 216 (H) . 08/24/2019 .   Marland Kitchen Lab Results .  Component . Value . Date .   Marland Kitchen HDL . 58 . 08/31/2020 .   Marland Kitchen HDL . 62 . 03/01/2020 .   Marland Kitchen HDL . 67 . 08/24/2019 .   Marland Kitchen Lab Results .  Component . Value . Date .   Marland Kitchen [employer] . 106 (H) . 08/31/2020 .   Marland Kitchen [place] . 118 (H) . 03/01/2020 .   Marland Kitchen [place] . 119 (H) . 08/24/2019 .   Marland Kitchen Lab Results .  Component . Value . Date .   Marland Kitchen TRIG . 143  . 08/31/2020 .   Marland Kitchen TRIG . 193 (H) . 03/01/2020 .   Marland Kitchen TRIG . 174 (H) . 08/24/2019 .   Marland Kitchen No results found for: CHOLHDL . No results found for: LDLDIRECT  . ASCVD risk enhancing conditions: age >70, CAD, pulmonary fibrosis, chronic pain . Unable to independently manage HLD . Does not contact provider office for questions/concerns  RN Care Manager Clinical Goal(s):  Marland Kitchen Over the next 120 days, patient will work with Consulting civil engineer, providers, and care team towards execution of optimized self-health management plan . Over the next 120 days, patient will verbalize understanding of plan for HLD management  . Over the next 120 days, patient will attend all scheduled medical appointments: 03-02-2021  Interventions: Marland Kitchen Medication review performed; medication list updated in electronic medical record.  Bertram Savin care team collaboration (see longitudinal plan of care) . Referred to pharmacy team for assistance with HLD medication management . Evaluation of current treatment plan related to HLD and patient's adherence to plan as established by provider. 09-13-2020: Under good control at this time. Saw pcp on 08-31-2020. No changes in the current regimen for HLD.  . Advised patient to call the office for changes in condition or questions . Provided education to patient re: staying hydrated and eating a balanced meal . Reviewed medications with patient and discussed compliance  . Discussed plans with patient for ongoing care management follow up and provided patient with direct contact information for care management team . Reviewed scheduled/upcoming provider appointments including: 03-02-2021 at 11 am. The patient has several upcoming appointments at this time due to changes in pulmonary status   Patient Goals/Self-Care Activities: . Over the next 120 days, patient will:   - call for medicine refill 2 or 3 days before it runs out - call if I am sick and can't take my medicine - keep a list  of all the medicines I take; vitamins and herbals too - learn to read medicine labels - use a pillbox to sort medicine - use an alarm clock or phone to remind me to take my medicine - drink 6 to 8 glasses of water each day - eat 3 to 5 servings of fruits and vegetables each day - eat 5 or 6 small meals each day - limit fast food meals to no more than 1 per week - manage portion size - prepare main meal at home 3 to 5 days each week - read food labels for fat, fiber, carbohydrates and portion size - set a realistic goal - be open to making changes - I can manage, know and watch for signs of  a heart attack - if I have chest pain, call for help - learn about small changes that will make a big difference - learn my personal risk factors - barriers to meeting goals identified - choices provided - collaboration with team encouraged - decision-making supported - health risks reviewed - problem-solving facilitated - questions answered - reassurance provided - resources needed to meet goals identified - self-reliance encouraged - verbalization of feelings encouraged   Follow Up Plan: Telephone follow up appointment with care management team member scheduled for: 11-15-2020 1030 am     Task: RNCM: HLD-Mutually Develop and Royce Macadamia Achievement of Patient Goals   Note:   Care Management Activities:    - barriers to meeting goals identified - choices provided - collaboration with team encouraged - decision-making supported - health risks reviewed - problem-solving facilitated - questions answered - reassurance provided - resources needed to meet goals identified - self-reliance encouraged - verbalization of feelings encouraged       Patient Care Plan: RNCM: Pulmonary Fibrosis/Shortness of Breath Plan of Care    Problem Identified: RNCM: Pulmonary Fibrosis and Shortness of Breath   Priority: Medium    Long-Range Goal: RNCM: Pulmonary Fibrosis and Shortness of breath Disease  Progression Minimized or Managed   Priority: Medium  Note:   Current Barriers:  Marland Kitchen Knowledge deficits related to basic understanding of Pulmonary Fibrosis and shortness of Breath disease process . Knowledge deficits related to basic Pulmonary Fibrosis self care/management . Knowledge deficit related to importance of energy conservation . Unable to independently manage Pulmonary Fibrosis as evidence of being oxygen dependent and worsening shortness of breath. . Does not contact provider office for questions/concerns  Case Manager Clinical Goal(s):  Over the next 120 days, patient will be able to verbalize understanding of Pulmonary Fibrosis action plan and when to seek appropriate levels of medical care  Over the next 120 days, patient will engage in lite exercise as tolerated to build/regain stamina and strength and reduce shortness of breath through activity tolerance  Over the next 120 days, patient will verbalize basic understanding of Pulmonary Fibrosis disease process and self care activities  Over the next 120 days, patient will not be hospitalized for Pulmonary fibrosis exacerbation  Interventions:   UNABLE to independently: manage pulmonary fibrosis   Provided patient with basic written and verbal pulmonary fibrosis  education on self care/management/and exacerbation prevention   Provided patient with Pulmonary fibrosis  action plan and reinforced importance of daily self assessment. 09-13-2020: Saw pulmonary provider on 09-06-2020 and did walk test. Is doing well with her breathing at this time. Denies any new concerns with PF.   Provided written and verbal instructions on pursed lip breathing and utilized returned demonstration as teach back  Provided instruction about proper use of medications used for management of pulmonary fibrosis including inhalers  Advised patient to self assesses pulmonary fibrosis action plan zone and make appointment with provider if in the yellow zone  for 48 hours without improvement.  Provided patient with education about the role of exercise in the management of Pulmonary fibrosis   Provided education about and advised patient to utilize infection prevention strategies to reduce risk of respiratory infection   Evaluation of recent ECHO and follow up.  The patient states that she is now dealing with pulmonary HTN and they have started her on a new medication: Tyvaso and she has been on that for one week. She will follow up with the providers at [facility] closely. She can not tell  of any changes right now as she has not been on the medication long. She is using her oxygen and her breathing is about the same. She is ready for her chronic conditions to balance out. Will continue to monitor for changes. 09-13-2020: Is doing well with Tyvaso and states she has adjusted. Is happy right now that she is not having any exacerbations with PF or Pulmonary HTN. Patient Goals/Self-Care Activities:  . - activity or exercise based on tolerance encouraged . - barriers to treatment managed . - breathing techniques encouraged . - communicable disease prevention promoted . - medication-adherence assessment completed . - quality of sleep assessed . - rescue (action) plan reviewed . - screen for functional limitations completed and reviewed . - self-awareness of symptom triggers encouraged Follow Up Plan: Telephone follow up appointment with care management team member scheduled for:  6-28-2022at 1030 am   Task: RNCM: Alleviate Barriers to Pulmonary Fiborosis  Management   Note:   Care Management Activities:    - activity or exercise based on tolerance encouraged - barriers to treatment managed - breathing techniques encouraged - communicable disease prevention promoted - medication-adherence assessment completed - quality of sleep assessed - rescue (action) plan reviewed - screen for functional limitations completed and reviewed - self-awareness of symptom  triggers encouraged         Patient verbalizes understanding of instructions provided today and agrees to view in [place].   Telephone follow up appointment with care management team member scheduled for:11-15-2020 at 92 am  Andrew, MSN, [facility] Mobile: [phone]

## 2020-09-14 ENCOUNTER — Encounter: Payer: Self-pay | Admitting: Family Medicine

## 2020-09-14 DIAGNOSIS — M19011 Primary osteoarthritis, right shoulder: Secondary | ICD-10-CM | POA: Diagnosis not present

## 2020-09-14 DIAGNOSIS — I110 Hypertensive heart disease with heart failure: Secondary | ICD-10-CM | POA: Diagnosis not present

## 2020-09-14 DIAGNOSIS — I509 Heart failure, unspecified: Secondary | ICD-10-CM | POA: Diagnosis not present

## 2020-09-14 DIAGNOSIS — M7541 Impingement syndrome of right shoulder: Secondary | ICD-10-CM | POA: Diagnosis not present

## 2020-09-14 DIAGNOSIS — M1611 Unilateral primary osteoarthritis, right hip: Secondary | ICD-10-CM | POA: Diagnosis not present

## 2020-09-14 DIAGNOSIS — I2510 Atherosclerotic heart disease of native coronary artery without angina pectoris: Secondary | ICD-10-CM | POA: Diagnosis not present

## 2020-09-16 DIAGNOSIS — I2510 Atherosclerotic heart disease of native coronary artery without angina pectoris: Secondary | ICD-10-CM | POA: Diagnosis not present

## 2020-09-16 DIAGNOSIS — M1611 Unilateral primary osteoarthritis, right hip: Secondary | ICD-10-CM | POA: Diagnosis not present

## 2020-09-16 DIAGNOSIS — M7541 Impingement syndrome of right shoulder: Secondary | ICD-10-CM | POA: Diagnosis not present

## 2020-09-16 DIAGNOSIS — M19011 Primary osteoarthritis, right shoulder: Secondary | ICD-10-CM | POA: Diagnosis not present

## 2020-09-16 DIAGNOSIS — I509 Heart failure, unspecified: Secondary | ICD-10-CM | POA: Diagnosis not present

## 2020-09-16 DIAGNOSIS — I110 Hypertensive heart disease with heart failure: Secondary | ICD-10-CM | POA: Diagnosis not present

## 2020-09-21 DIAGNOSIS — M7541 Impingement syndrome of right shoulder: Secondary | ICD-10-CM | POA: Diagnosis not present

## 2020-09-21 DIAGNOSIS — M1611 Unilateral primary osteoarthritis, right hip: Secondary | ICD-10-CM | POA: Diagnosis not present

## 2020-09-21 DIAGNOSIS — I110 Hypertensive heart disease with heart failure: Secondary | ICD-10-CM | POA: Diagnosis not present

## 2020-09-21 DIAGNOSIS — I509 Heart failure, unspecified: Secondary | ICD-10-CM | POA: Diagnosis not present

## 2020-09-21 DIAGNOSIS — I2510 Atherosclerotic heart disease of native coronary artery without angina pectoris: Secondary | ICD-10-CM | POA: Diagnosis not present

## 2020-09-21 DIAGNOSIS — M19011 Primary osteoarthritis, right shoulder: Secondary | ICD-10-CM | POA: Diagnosis not present

## 2020-09-23 DIAGNOSIS — M7541 Impingement syndrome of right shoulder: Secondary | ICD-10-CM | POA: Diagnosis not present

## 2020-09-23 DIAGNOSIS — M1611 Unilateral primary osteoarthritis, right hip: Secondary | ICD-10-CM | POA: Diagnosis not present

## 2020-09-23 DIAGNOSIS — I509 Heart failure, unspecified: Secondary | ICD-10-CM | POA: Diagnosis not present

## 2020-09-23 DIAGNOSIS — I110 Hypertensive heart disease with heart failure: Secondary | ICD-10-CM | POA: Diagnosis not present

## 2020-09-23 DIAGNOSIS — I2510 Atherosclerotic heart disease of native coronary artery without angina pectoris: Secondary | ICD-10-CM | POA: Diagnosis not present

## 2020-09-23 DIAGNOSIS — M19011 Primary osteoarthritis, right shoulder: Secondary | ICD-10-CM | POA: Diagnosis not present

## 2020-09-26 DIAGNOSIS — R0602 Shortness of breath: Secondary | ICD-10-CM | POA: Diagnosis not present

## 2020-09-28 ENCOUNTER — Ambulatory Visit (INDEPENDENT_AMBULATORY_CARE_PROVIDER_SITE_OTHER): Payer: Medicare Other | Admitting: Pharmacist

## 2020-09-28 DIAGNOSIS — I272 Pulmonary hypertension, unspecified: Secondary | ICD-10-CM

## 2020-09-28 DIAGNOSIS — M8580 Other specified disorders of bone density and structure, unspecified site: Secondary | ICD-10-CM

## 2020-09-28 DIAGNOSIS — E782 Mixed hyperlipidemia: Secondary | ICD-10-CM

## 2020-09-28 NOTE — Progress Notes (Signed)
Chronic Care Management Pharmacy Note  10/18/2020 Name:  Melanie Rollins MRN:  [id number] DOB:  1946-02-10  Subjective: Melanie Rollins is an 75 y.o. year old female who is a primary patient of Valerie Roys, DO.  The CCM team was consulted for assistance with disease management and care coordination needs.    Engaged with patient by telephone for follow up visit in response to provider referral for pharmacy case management and/or care coordination services.   Consent to Services:  The patient was given information about Chronic Care Management services, agreed to services, and gave verbal consent prior to initiation of services.  Please see initial visit note for detailed documentation.   Patient Care Team: Valerie Roys, DO as PCP - General (Family Medicine) Emmaline Kluver., MD (Rheumatology) Charlie Pitter, MD as Referring Physician (Pulmonary Disease) Teodoro Spray, MD as Consulting Physician (Cardiology) Lovell Sheehan, MD as Consulting Physician (Orthopedic Surgery) Jannet Mantis, MD (Dermatology) Vanita Ingles, RN as Case Manager (General Practice) Vladimir Faster, [facility] (Pharmacist)  Recent office visits: 09/13/20-Johnson -phone call- patient requests taper off sertraline 08/31/20-  Johnson(PCP)-blood work  Recent consult visits: 09/06/20-[facility] (pulm- ILD program)- PFT's no obstruction, normal FVC 09/04/20-Sbardelini (Pulm)- overnight oximetry - needs nocturnal O2 at 2L 08/12/20- [place] (Pulmonology)- 2L fluid restrict, 1500 mg sodium restrict, CTD associtated PAH, 2l vis [place], Tyvaso increase to 6 inhalations and increase 1 per week rtc 2 months for 6 minute walk test  Hospital visits: None in previous 6 months  Objective:  Lab Results  Component Value Date   CREATININE 0.89 08/31/2020   BUN 16 08/31/2020   GFRNONAA 76 03/01/2020   GFRAA 87 03/01/2020   NA 136 08/31/2020   K 4.3 08/31/2020   CALCIUM 9.7 08/31/2020   CO2 18 (L)  08/31/2020   GLUCOSE 110 (H) 08/31/2020    No results found for: HGBA1C, FRUCTOSAMINE, GFR, MICROALBUR  Last diabetic Eye exam: No results found for: HMDIABEYEEXA  Last diabetic Foot exam: No results found for: HMDIABFOOTEX   Lab Results  Component Value Date   CHOL 189 08/31/2020   HDL 58 08/31/2020   LDLCALC 106 (H) 08/31/2020   TRIG 143 08/31/2020    Hepatic Function Latest Ref Rng & Units 08/31/2020 03/01/2020 09/10/2019  Total Protein 6.0 - 8.5 g/dL 7.0 6.7 8.0  Albumin 3.7 - 4.7 g/dL 4.4 4.2 4.5  AST 0 - 40 IU/L 53(H) 28 31  ALT 0 - 32 IU/L 48(H) 18 21  Alk Phosphatase 44 - 121 IU/L 88 61 58  Total Bilirubin 0.0 - 1.2 mg/dL 0.3 0.2 0.5    Lab Results  Component Value Date/Time   TSH 1.290 08/24/2019 09:34 AM   TSH 1.410 08/04/2018 04:52 PM    CBC Latest Ref Rng & Units 08/31/2020 03/01/2020 09/10/2019  WBC 3.4 - 10.8 x10E3/uL 11.0(H) 7.9 10.9(H)  Hemoglobin 11.1 - 15.9 g/dL 12.1 11.6 12.7  Hematocrit 34.0 - 46.6 % 37.5 36.9 39.7  Platelets 150 - 450 x10E3/uL 403 284 317    No results found for: VD25OH  Clinical ASCVD: Yes  The 10-year ASCVD risk score Mikey Bussing DC Jr., et al., 2013) is: 9.4%   Values used to calculate the score:     Age: 9 years     Sex: Female     Is Non-Hispanic African American: No     Diabetic: No     Tobacco smoker: No     Systolic  Blood Pressure: 108 mmHg     Is BP treated: No     HDL Cholesterol: 58 mg/dL     Total Cholesterol: 189 mg/dL    Depression screen [facility] 2/9 08/31/2020 08/26/2020 03/01/2020  Decreased Interest _0 Down, Depressed, Hopeless 1 0 1  PHQ - 2 Score _1 Altered sleeping 0 - 1  Tired, decreased energy 2 - 3  Change in appetite 0 - 2  Feeling bad or failure about yourself  0 - 0  Trouble concentrating 0 - 0  Moving slowly or fidgety/restless 0 - 0  Suicidal thoughts 0 - 0  PHQ-9 Score 4 - 8  Difficult doing work/chores Not difficult at all - Not difficult at all  Some recent data might be hidden    Social  History   Tobacco Use  Smoking Status Never Smoker  Smokeless Tobacco Never Used   BP Readings from Last 3 Encounters:  08/31/20 116/75  03/01/20 104/69  10/15/19 (!) 117/56   Pulse Readings from Last 3 Encounters:  08/31/20 (!) 106  03/01/20 71  10/15/19 78   Wt Readings from Last 3 Encounters:  08/31/20 111 lb 9.6 oz (50.6 kg)  08/26/20 112 lb (50.8 kg)  03/01/20 119 lb 12.8 oz (54.3 kg)   BMI Readings from Last 3 Encounters:  08/31/20 19.46 kg/m  08/26/20 19.53 kg/m  03/01/20 20.89 kg/m    Assessment/Interventions: Review of patient past medical history, allergies, medications, health status, including review of consultants reports, laboratory and other test data, was performed as part of comprehensive evaluation and provision of chronic care management services.   SDOH:  (Social Determinants of Health) assessments and interventions performed: No  SDOH Screenings   Alcohol Screen: Not on file  Depression (PHQ2-9): Low Risk   . PHQ-2 Score: 4  Financial Resource Strain: Low Risk   . Difficulty of Paying Living Expenses: Not hard at all  Food Insecurity: No Food Insecurity  . Worried About Charity fundraiser in the Last Year: Never true  . Ran Out of Food in the Last Year: Never true  Housing: Not on file  Physical Activity: Inactive  . Days of Exercise per Week: 0 days  . Minutes of Exercise per Session: 0 min  Social Connections: Not on file  Stress: No Stress Concern Present  . Feeling of Stress : Not at all  Tobacco Use: Low Risk   . Smoking Tobacco Use: Never Smoker  . Smokeless Tobacco Use: Never Used  Transportation Needs: No Transportation Needs  . Lack of Transportation (Medical): No  . Lack of Transportation (Non-Medical): No     Immunization History  Administered Date(s) Administered  . Influenza, High Dose Seasonal PF 01/19/2018  . Influenza-Unspecified 02/17/2015, 03/09/2016, 02/07/2017, 02/08/2019, 03/14/2020  . PFIZER(Purple  Top)SARS-COV-2 Vaccination 06/30/2019, 07/21/2019, 01/17/2020  . Pneumococcal Conjugate-13 07/02/2014  . Pneumococcal Polysaccharide-23 07/04/2015  . Td 03/16/2009  . Zoster Recombinat (Shingrix) 07/11/2016, 10/29/2016  . Zoster, Live 07/06/2016    Conditions to be addressed/monitored:  Hypertension, Hyperlipidemia, GERD, COPD, Depression, Anxiety and Osteoporosis pulmonary hypertension, Pulmonary Fibrosis, CAD, Reynaud's phenomenon  Care Plan : [place]  Updates made by Vladimir Faster, [place] since 10/07/2020 12:00 AM    Problem: PAH, Pulmolary fibrosis, Reynaud's, Hyperlipidemia, CAD, connective tissue disorder, ostearthritis, osteoporosis   Priority: High    Long-Range Goal: Disease Management   Start Date: 09/05/2020  This Visit's Progress: On track  Priority: High  Note:  Current Barriers:  . Unable to achieve control of Pulmonary fibrosis, hyperlipidemai  . Unable to maintain control of blood pressure . Suboptimal therapeutic regimen for lipids, CAD  Pharmacist Clinical Goal(s):  Marland Kitchen Patient will achieve adherence to monitoring guidelines and medication adherence to achieve therapeutic efficacy . maintain control of osteoporosis, osteoarthritis as evidenced by lack of fracture, routine screenings . contact provider office for questions/concerns as evidenced notation of same in electronic health record through collaboration with PharmD and provider.   Interventions: . 1:1 collaboration with Valerie Roys, DO regarding development and update of comprehensive plan of care as evidenced by provider attestation and co-signature . Inter-disciplinary care team collaboration (see longitudinal plan of care) . Comprehensive medication review performed; medication list updated in electronic medical record     Pulmonary arterial hypertension (Goal: reduced symptom) -Not ideally controlled -Current treatment  . Tyvaso 10 breath qid . Furosemide 20 mg prn  edema -Medications previously tried: NA  -Patient reports tolerating Tyvaso okay. She is having some dziiness and low BP.  - She has not needed to take furosemide and thinks her breathing may be improving. -She follows with Dr. Gilles Chiquito Pulmonology and is increasing Tyvaso dosage by 1 breath per week    Hyperlipidemia Lab Results  Component Value Date/Time   LDLCALC 106 (H) 08/31/2020 11:31 AM   . Pharmacist Clinical Goal(s): o Over the next 60 days, patient will work with PharmD and providers to achieve LDL goal < 70 . Current regimen:  o Fish oil 1037m qd . Interventions: o Reviewed chart for cardiology note 09/05/20: cardiac cath 06/30/20 o Reviewed lab values. o Counseled on therapeutic dose of Fish oil for lipid benefit o Consider  addition of statin . Patient self care activities - Over the next 60 days, patient will: o Attend all scheduled appointments Osteopenia . Pharmacist Clinical Goal(s) o Over the next 60 days, patient will work with PharmD and providers to optimize therapy . Current regimen:  . Prolia 60 mg q 6 months . Calcium/vitamin D bid . Interventions: o Reviewed DEXA results o Provided diet and exercise counseling. . Patient self care activities - Over the next 60 days, patient will: o Continue medication as prescribed.  o Schedule follow up DEXA after June 10   She wants a THR but doubtful she will ever get clearance Medication management . Pharmacist Clinical Goal(s): o Over the next 60 days, patient will work with PharmD and providers to achieve optimal medication adherence . [facility] Interventions o Comprehensive medication review performed. o Continue current medication management strategy . Patient self care activities - Over the next 60 days, patient will: o Focus on medication adherence by fill dates o Take medications as prescribed o Report any questions or concerns to PharmD and/or provider(s)             Medication Assistance: None required.  Patient affirms current coverage meets needs.  [facility] [facility]  [place] [address]Phone: [phone]: [phone] [facility] [address] [place] [address]Phone: [phone]: [phone] Uses pill box? Yes Pt endorses 95% compliance  We discussed: Current pharmacy is preferred with insurance plan and patient is satisfied with pharmacy services Patient decided to: Continue current medication management strategy  Care Plan and Follow Up Patient Decision:  Patient agrees to Care Plan and Follow-up.  Plan: Telephone follow up appointment with care management team member scheduled for:  1 month CPA, 3 months PharmD  Junita Push. Kenton Kingfisher PharmD, [facility] [phone]

## 2020-09-30 DIAGNOSIS — I509 Heart failure, unspecified: Secondary | ICD-10-CM | POA: Diagnosis not present

## 2020-09-30 DIAGNOSIS — I2510 Atherosclerotic heart disease of native coronary artery without angina pectoris: Secondary | ICD-10-CM | POA: Diagnosis not present

## 2020-09-30 DIAGNOSIS — M7541 Impingement syndrome of right shoulder: Secondary | ICD-10-CM | POA: Diagnosis not present

## 2020-09-30 DIAGNOSIS — I110 Hypertensive heart disease with heart failure: Secondary | ICD-10-CM | POA: Diagnosis not present

## 2020-09-30 DIAGNOSIS — M1611 Unilateral primary osteoarthritis, right hip: Secondary | ICD-10-CM | POA: Diagnosis not present

## 2020-09-30 DIAGNOSIS — M19011 Primary osteoarthritis, right shoulder: Secondary | ICD-10-CM | POA: Diagnosis not present

## 2020-10-04 DIAGNOSIS — I2510 Atherosclerotic heart disease of native coronary artery without angina pectoris: Secondary | ICD-10-CM | POA: Diagnosis not present

## 2020-10-04 DIAGNOSIS — I509 Heart failure, unspecified: Secondary | ICD-10-CM | POA: Diagnosis not present

## 2020-10-04 DIAGNOSIS — M19011 Primary osteoarthritis, right shoulder: Secondary | ICD-10-CM | POA: Diagnosis not present

## 2020-10-04 DIAGNOSIS — M7541 Impingement syndrome of right shoulder: Secondary | ICD-10-CM | POA: Diagnosis not present

## 2020-10-04 DIAGNOSIS — M1611 Unilateral primary osteoarthritis, right hip: Secondary | ICD-10-CM | POA: Diagnosis not present

## 2020-10-04 DIAGNOSIS — I110 Hypertensive heart disease with heart failure: Secondary | ICD-10-CM | POA: Diagnosis not present

## 2020-10-05 DIAGNOSIS — I2510 Atherosclerotic heart disease of native coronary artery without angina pectoris: Secondary | ICD-10-CM | POA: Diagnosis not present

## 2020-10-05 DIAGNOSIS — H579 Unspecified disorder of eye and adnexa: Secondary | ICD-10-CM | POA: Diagnosis not present

## 2020-10-05 DIAGNOSIS — I509 Heart failure, unspecified: Secondary | ICD-10-CM | POA: Diagnosis not present

## 2020-10-05 DIAGNOSIS — M7541 Impingement syndrome of right shoulder: Secondary | ICD-10-CM | POA: Diagnosis not present

## 2020-10-05 DIAGNOSIS — M19011 Primary osteoarthritis, right shoulder: Secondary | ICD-10-CM | POA: Diagnosis not present

## 2020-10-05 DIAGNOSIS — M1611 Unilateral primary osteoarthritis, right hip: Secondary | ICD-10-CM | POA: Diagnosis not present

## 2020-10-05 DIAGNOSIS — Z9181 History of falling: Secondary | ICD-10-CM | POA: Diagnosis not present

## 2020-10-05 DIAGNOSIS — Z79891 Long term (current) use of opiate analgesic: Secondary | ICD-10-CM | POA: Diagnosis not present

## 2020-10-05 DIAGNOSIS — I2721 Secondary pulmonary arterial hypertension: Secondary | ICD-10-CM | POA: Diagnosis not present

## 2020-10-05 DIAGNOSIS — Z79899 Other long term (current) drug therapy: Secondary | ICD-10-CM | POA: Diagnosis not present

## 2020-10-05 DIAGNOSIS — M359 Systemic involvement of connective tissue, unspecified: Secondary | ICD-10-CM | POA: Diagnosis not present

## 2020-10-05 DIAGNOSIS — F32A Depression, unspecified: Secondary | ICD-10-CM | POA: Diagnosis not present

## 2020-10-05 DIAGNOSIS — I270 Primary pulmonary hypertension: Secondary | ICD-10-CM | POA: Diagnosis not present

## 2020-10-05 DIAGNOSIS — F419 Anxiety disorder, unspecified: Secondary | ICD-10-CM | POA: Diagnosis not present

## 2020-10-05 DIAGNOSIS — J9611 Chronic respiratory failure with hypoxia: Secondary | ICD-10-CM | POA: Diagnosis not present

## 2020-10-05 DIAGNOSIS — M810 Age-related osteoporosis without current pathological fracture: Secondary | ICD-10-CM | POA: Diagnosis not present

## 2020-10-05 DIAGNOSIS — I110 Hypertensive heart disease with heart failure: Secondary | ICD-10-CM | POA: Diagnosis not present

## 2020-10-05 DIAGNOSIS — I4891 Unspecified atrial fibrillation: Secondary | ICD-10-CM | POA: Diagnosis not present

## 2020-10-05 DIAGNOSIS — R002 Palpitations: Secondary | ICD-10-CM | POA: Diagnosis not present

## 2020-10-05 DIAGNOSIS — D649 Anemia, unspecified: Secondary | ICD-10-CM | POA: Diagnosis not present

## 2020-10-05 DIAGNOSIS — E7800 Pure hypercholesterolemia, unspecified: Secondary | ICD-10-CM | POA: Diagnosis not present

## 2020-10-05 DIAGNOSIS — Z9981 Dependence on supplemental oxygen: Secondary | ICD-10-CM | POA: Diagnosis not present

## 2020-10-05 DIAGNOSIS — Z7951 Long term (current) use of inhaled steroids: Secondary | ICD-10-CM | POA: Diagnosis not present

## 2020-10-05 DIAGNOSIS — R32 Unspecified urinary incontinence: Secondary | ICD-10-CM | POA: Diagnosis not present

## 2020-10-05 DIAGNOSIS — Z7952 Long term (current) use of systemic steroids: Secondary | ICD-10-CM | POA: Diagnosis not present

## 2020-10-05 DIAGNOSIS — K219 Gastro-esophageal reflux disease without esophagitis: Secondary | ICD-10-CM | POA: Diagnosis not present

## 2020-10-05 DIAGNOSIS — M545 Low back pain, unspecified: Secondary | ICD-10-CM | POA: Diagnosis not present

## 2020-10-05 DIAGNOSIS — M069 Rheumatoid arthritis, unspecified: Secondary | ICD-10-CM | POA: Diagnosis not present

## 2020-10-05 DIAGNOSIS — R0602 Shortness of breath: Secondary | ICD-10-CM | POA: Diagnosis not present

## 2020-10-05 DIAGNOSIS — J8410 Pulmonary fibrosis, unspecified: Secondary | ICD-10-CM | POA: Diagnosis not present

## 2020-10-07 ENCOUNTER — Encounter: Payer: Self-pay | Admitting: Family Medicine

## 2020-10-07 NOTE — Patient Instructions (Addendum)
Visit Information  It was a pleasure speaking with you today. Thank you for letting me be part of your clinical team. Please call with any questions or concerns.   Goals Addressed            This Visit's Progress   . Manage Pain-Osteoarthritis       Timeframe:  Long-Range Goal Priority:  Medium Start Date:                             Expected End Date:                       Follow Up Date 3 month follow up   - call for medicine refill 2 or 3 days before it runs out - develop a personal pain management plan - prioritize tasks for the day - track what makes the pain worse and what makes it better - use ice or heat for pain relief    Why is this important?    Day-to-day life can be hard when you have joint pain.   Pain medicine is just one piece of the treatment puzzle. There are many things you can do to manage pain and stay strong.    Lifestyle changes like stopping smoking and eating foods with Vitamin D and calcium keeps your bones and muscles healthy. Your joints are better when supported by strong muscles.   You can try these action steps to help you manage your pain.     Notes:     . Prevent Falls and Broken Bones-Osteoporosis       Timeframe:  Long-Range Goal Priority:  High Start Date:                             Expected End Date:                       Follow Up Date 2 month follow up   - always wear shoes or slippers with non-slip sole - install bathroom grab bars - keep a flashlight by the bed - make an emergency alert plan in case I fall - remove, or use a non-slip pad, with my throw rugs - use a cane or walker - use a nightlight in the bathroom    Why is this important?    When you fall, there are 3 things that control if a bone breaks or not.   These are the fall itself, how hard and the direction that you fall and how fragile your bones are.   Preventing falls is very important for you because of fragile bones.     Notes:        The  patient verbalized understanding of instructions, educational materials, and care plan provided today and agreed to receive a mailed copy of patient instructions, educational materials, and care plan.   Telephone follow up appointment with pharmacy team member scheduled for: 1 month  Junita Push. Kenton Kingfisher PharmD, BCPS Clinical Pharmacist [phone]  Hypotension As your heart beats, it forces blood through your body. This force is called blood pressure. If you have hypotension, you have low blood pressure. When your blood pressure is too low, you may not get enough blood to your brain or other parts of your body. This may cause you to feel weak, light-headed, have a fast heartbeat, or even pass out (  faint). Low blood pressure may be harmless, or it may cause serious problems. What are the causes?  Blood loss.  Not enough water in the body (dehydration).  Heart problems.  Hormone problems.  Pregnancy.  A very bad infection.  Not having enough of certain nutrients.  Very bad allergic reactions.  Certain medicines. What increases the risk?  Age. The risk increases as you get older.  Conditions that affect the heart or the brain and spinal cord (central nervous system).  Taking certain medicines.  Being pregnant. What are the signs or symptoms?  Feeling: ? Weak. ? Light-headed. ? Dizzy. ? Tired (fatigued).  Blurred vision.  Fast heartbeat.  Passing out, in very bad cases. How is this treated?  Changing your diet. This may involve eating more salt (sodium) or drinking more water.  Taking medicines to raise your blood pressure.  Changing how much you take (the dosage) of some of your medicines.  Wearing compression stockings. These stockings help to prevent blood clots and reduce swelling in your legs. In some cases, you may need to go to the hospital for:  Fluid replacement. This means you will receive fluids through an IV tube.  Blood replacement. This means you  will receive donated blood through an IV tube (transfusion).  Treating an infection or heart problems, if this applies.  Monitoring. You may need to be monitored while medicines that you are taking wear off. Follow these instructions at home: Eating and drinking  Drink enough fluids to keep your pee (urine) pale yellow.  Eat a healthy diet. Follow instructions from your doctor about what you can eat or drink. A healthy diet includes: ? Fresh fruits and vegetables. ? Whole grains. ? Low-fat (lean) meats. ? Low-fat dairy products.  Eat extra salt only as told. Do not add extra salt to your diet unless your doctor tells you to.  Eat small meals often.  Avoid standing up quickly after you eat.   Medicines  Take over-the-counter and prescription medicines only as told by your doctor. ? Follow instructions from your doctor about changing how much you take of your medicines, if this applies. ? Do not stop or change any of your medicines on your own. General instructions  Wear compression stockings as told by your doctor.  Get up slowly from lying down or sitting.  Avoid hot showers and a lot of heat as told by your doctor.  Return to your normal activities as told by your doctor. Ask what activities are safe for you.  Do not use any products that contain nicotine or tobacco, such as cigarettes, e-cigarettes, and chewing tobacco. If you need help quitting, ask your doctor.  Keep all follow-up visits as told by your doctor. This is important.   Contact a doctor if:  You throw up (vomit).  You have watery poop (diarrhea).  You have a fever for more than 2-3 days.  You feel more thirsty than normal.  You feel weak and tired. Get help right away if:  You have chest pain.  You have a fast or uneven heartbeat.  You lose feeling (have numbness) in any part of your body.  You cannot move your arms or your legs.  You have trouble talking.  You get sweaty or feel  light-headed.  You pass out.  You have trouble breathing.  You have trouble staying awake.  You feel mixed up (confused). Summary  Hypotension is also called low blood pressure. It is when the force of  blood pumping through your arteries is too weak.  Hypotension may be harmless, or it may cause serious problems.  Treatment may include changing your diet and medicines, and wearing compression stockings.  In very bad cases, you may need to go to the hospital. This information is not intended to replace advice given to you by your health care provider. Make sure you discuss any questions you have with your health care provider. Document Revised: 10/31/2017 Document Reviewed: 10/31/2017 Elsevier Patient Education  [place].

## 2020-10-12 DIAGNOSIS — M1611 Unilateral primary osteoarthritis, right hip: Secondary | ICD-10-CM | POA: Diagnosis not present

## 2020-10-12 DIAGNOSIS — I2510 Atherosclerotic heart disease of native coronary artery without angina pectoris: Secondary | ICD-10-CM | POA: Diagnosis not present

## 2020-10-12 DIAGNOSIS — M7541 Impingement syndrome of right shoulder: Secondary | ICD-10-CM | POA: Diagnosis not present

## 2020-10-12 DIAGNOSIS — I110 Hypertensive heart disease with heart failure: Secondary | ICD-10-CM | POA: Diagnosis not present

## 2020-10-12 DIAGNOSIS — I509 Heart failure, unspecified: Secondary | ICD-10-CM | POA: Diagnosis not present

## 2020-10-12 DIAGNOSIS — M19011 Primary osteoarthritis, right shoulder: Secondary | ICD-10-CM | POA: Diagnosis not present

## 2020-10-13 ENCOUNTER — Telehealth: Payer: Self-pay

## 2020-10-13 NOTE — Telephone Encounter (Signed)
Pt's spouse brought Forms to be filled , left in bin to be reived. Pt's spouses trying to receive  Fmal to help pt.

## 2020-10-13 NOTE — Telephone Encounter (Signed)
Placed form in provider signature form.

## 2020-10-13 NOTE — Telephone Encounter (Signed)
erroneous error  

## 2020-10-18 NOTE — Patient Instructions (Addendum)
Visit Information  It was a pleasure speaking with you today. Thank you for letting me be part of your clinical team. Please call with any questions or concerns.   Goals Addressed            This Visit's Progress   . Manage Pain-Osteoarthritis   On track    Timeframe:  Long-Range Goal Priority:  Medium Start Date:                             Expected End Date:                       Follow Up Date 3 month follow up   - call for medicine refill 2 or 3 days before it runs out - develop a personal pain management plan - prioritize tasks for the day - track what makes the pain worse and what makes it better - use ice or heat for pain relief    Why is this important?    Day-to-day life can be hard when you have joint pain.   Pain medicine is just one piece of the treatment puzzle. There are many things you can do to manage pain and stay strong.    Lifestyle changes like stopping smoking and eating foods with Vitamin D and calcium keeps your bones and muscles healthy. Your joints are better when supported by strong muscles.   You can try these action steps to help you manage your pain.     Notes:     . Pharmacy Care Plan       CARE PLAN ENTRY (see longitudinal plan of care for additional care plan information)  Current Barriers:  . Chronic Disease Management support, education, and care coordination needs related to  Hyperlipidemia, Coronary Artery Disease, GERD, Depression, Anxiety, Osteoporosis, Osteoarthritis, and Pulmonary Fibrosis and Reynaud's phenomenon     Hyperlipidemia Lab Results  Component Value Date/Time   LDLCALC 106 (H) 08/31/2020 11:31 AM   . Pharmacist Clinical Goal(s): o Over the next 60 days, patient will work with PharmD and providers to achieve LDL goal < 70 . Current regimen:  o Fish oil 1039m qd . Interventions: o Reviewed chart for cardiology note.  o Reviewed lab values. o Counseled on therapeutic dose of Fish oil for lipid benefit o Will  discuss statin therapy with pcp . Patient self care activities - Over the next 60 days, patient will: o Attend all scheduled appointments Osteopenia . Pharmacist Clinical Goal(s) o Over the next 60 days, patient will work with PharmD and providers to optimize therapy . Current regimen:  . Prolia 60 mg q 6 months . Calcium/vitamin D bid . Interventions: o Reviewed DEXA results o Provided diet and exercise counseling. . Patient self care activities - Over the next 60 days, patient will: o Continue medication as prescribed.  o Schedule follow up DEXA after June 10  Medication management . Pharmacist Clinical Goal(s): o Over the next 60 days, patient will work with PharmD and providers to achieve optimal medication adherence . Current pharmacy: [facility]. Interventions o Comprehensive medication review performed. o Continue current medication management strategy . Patient self care activities - Over the next 60 days, patient will: o Focus on medication adherence by fill dates o Take medications as prescribed o Report any questions or concerns to PharmD and/or provider(s)  Initial goal documentation     . Prevent Falls and  Broken Bones-Osteoporosis   On track    Timeframe:  Long-Range Goal Priority:  High Start Date:                             Expected End Date:                       Follow Up Date 2 month follow up   - always wear shoes or slippers with non-slip sole - install bathroom grab bars - keep a flashlight by the bed - make an emergency alert plan in case I fall - remove, or use a non-slip pad, with my throw rugs - use a cane or walker - use a nightlight in the bathroom    Why is this important?    When you fall, there are 3 things that control if a bone breaks or not.   These are the fall itself, how hard and the direction that you fall and how fragile your bones are.   Preventing falls is very important for you because of fragile bones.      Notes:        The patient verbalized understanding of instructions, educational materials, and care plan provided today and agreed to receive a mailed copy of patient instructions, educational materials, and care plan.   Telephone follow up appointment with pharmacy team member scheduled for: 1 month  Junita Push. Trinaty Bundrick PharmD, BCPS Clinical Pharmacist [phone]  Preventing High Cholesterol Cholesterol is a white, waxy substance similar to fat that the human body needs to help build cells. The liver makes all the cholesterol that a person's body needs. Having high cholesterol (hypercholesterolemia) increases your risk for heart disease and stroke. Extra or excess cholesterol comes from the food that you eat. High cholesterol can often be prevented with diet and lifestyle changes. If you already have high cholesterol, you can control it with diet, lifestyle changes, and medicines. How can high cholesterol affect me? If you have high cholesterol, fatty deposits (plaques) may build up on the walls of your blood vessels. The blood vessels that carry blood away from your heart are called arteries. Plaques make the arteries narrower and stiffer. This in turn can:  Restrict or block blood flow and cause blood clots to form.  Increase your risk for heart attack and stroke. What can increase my risk for high cholesterol? This condition is more likely to develop in people who:  Eat foods that are high in saturated fat or cholesterol. Saturated fat is mostly found in foods that come from animal sources.  Are overweight.  Are not getting enough exercise.  Have a family history of high cholesterol (familial hypercholesterolemia). What actions can I take to prevent this? Nutrition  Eat less saturated fat.  Avoid trans fats (partially hydrogenated oils). These are often found in margarine and in some baked goods, fried foods, and snacks bought in packages.  Avoid precooked or cured meat,  such as bacon, sausages, or meat loaves.  Avoid foods and drinks that have added sugars.  Eat more fruits, vegetables, and whole grains.  Choose healthy sources of protein, such as fish, poultry, lean cuts of red meat, beans, peas, lentils, and nuts.  Choose healthy sources of fat, such as: ? Nuts. ? Vegetable oils, especially olive oil. ? Fish that have healthy fats, such as omega-3 fatty acids. These fish include mackerel or salmon.   Lifestyle  Lose weight if  you are overweight. Maintaining a healthy body mass index (BMI) can help prevent or control high cholesterol. It can also lower your risk for diabetes and high blood pressure. Ask your health care provider to help you with a diet and exercise plan to lose weight safely.  Do not use any products that contain nicotine or tobacco, such as cigarettes, e-cigarettes, and chewing tobacco. If you need help quitting, ask your health care provider. Alcohol use  Do not drink alcohol if: ? Your health care provider tells you not to drink. ? You are pregnant, may be pregnant, or are planning to become pregnant.  If you drink alcohol: ? Limit how much you use to:  0-1 drink a day for women.  0-2 drinks a day for men. ? Be aware of how much alcohol is in your drink. In the [place] one drink equals one 12 oz bottle of beer (355 mL), one 5 oz glass of wine (148 mL), or one 1 oz glass of hard liquor (44 mL). Activity  Get enough exercise. Do exercises as told by your health care provider.  Each week, do at least 150 minutes of exercise that takes a medium level of effort (moderate-intensity exercise). This kind of exercise: ? Makes your heart beat faster while allowing you to still be able to talk. ? Can be done in short sessions several times a day or longer sessions a few times a week. For example, on 5 days each week, you could walk fast or ride your bike 3 times a day for 10 minutes each time.   Medicines  Your health care provider  may recommend medicines to help lower cholesterol. This may be a medicine to lower the amount of cholesterol that your liver makes. You may need medicine if: ? Diet and lifestyle changes have not lowered your cholesterol enough. ? You have high cholesterol and other risk factors for heart disease or stroke.  Take over-the-counter and prescription medicines only as told by your health care provider. General information  Manage your risk factors for high cholesterol. Talk with your health care provider about all your risk factors and how to lower your risk.  Manage other conditions that you have, such as diabetes or high blood pressure (hypertension).  Have blood tests to check your cholesterol levels at regular points in time as told by your health care provider.  Keep all follow-up visits as told by your health care provider. This is important. Where to find more information  American Heart Association: www.heart.org  National Heart, Lung, and Blood Institute: https://wilson-eaton.com/ Summary  High cholesterol increases your risk for heart disease and stroke. By keeping your cholesterol level low, you can reduce your risk for these conditions.  High cholesterol can often be prevented with diet and lifestyle changes.  Work with your health care provider to manage your risk factors, and have your blood tested regularly. This information is not intended to replace advice given to you by your health care provider. Make sure you discuss any questions you have with your health care provider. Document Revised: 02/17/2019 Document Reviewed: 02/17/2019 Elsevier Patient Education  [place].

## 2020-10-20 DIAGNOSIS — I2510 Atherosclerotic heart disease of native coronary artery without angina pectoris: Secondary | ICD-10-CM | POA: Diagnosis not present

## 2020-10-20 DIAGNOSIS — I110 Hypertensive heart disease with heart failure: Secondary | ICD-10-CM | POA: Diagnosis not present

## 2020-10-20 DIAGNOSIS — M19011 Primary osteoarthritis, right shoulder: Secondary | ICD-10-CM | POA: Diagnosis not present

## 2020-10-20 DIAGNOSIS — I509 Heart failure, unspecified: Secondary | ICD-10-CM | POA: Diagnosis not present

## 2020-10-20 DIAGNOSIS — M7541 Impingement syndrome of right shoulder: Secondary | ICD-10-CM | POA: Diagnosis not present

## 2020-10-20 DIAGNOSIS — M1611 Unilateral primary osteoarthritis, right hip: Secondary | ICD-10-CM | POA: Diagnosis not present

## 2020-10-21 DIAGNOSIS — I2721 Secondary pulmonary arterial hypertension: Secondary | ICD-10-CM | POA: Diagnosis not present

## 2020-10-21 DIAGNOSIS — J9611 Chronic respiratory failure with hypoxia: Secondary | ICD-10-CM | POA: Diagnosis not present

## 2020-10-21 DIAGNOSIS — R002 Palpitations: Secondary | ICD-10-CM | POA: Diagnosis not present

## 2020-10-21 DIAGNOSIS — R0602 Shortness of breath: Secondary | ICD-10-CM | POA: Diagnosis not present

## 2020-10-21 DIAGNOSIS — M359 Systemic involvement of connective tissue, unspecified: Secondary | ICD-10-CM | POA: Diagnosis not present

## 2020-10-27 ENCOUNTER — Encounter: Payer: Self-pay | Admitting: Family Medicine

## 2020-10-28 ENCOUNTER — Encounter: Payer: Self-pay | Admitting: Family Medicine

## 2020-10-28 ENCOUNTER — Ambulatory Visit (INDEPENDENT_AMBULATORY_CARE_PROVIDER_SITE_OTHER): Payer: Medicare Other | Admitting: Family Medicine

## 2020-10-28 ENCOUNTER — Other Ambulatory Visit: Payer: Self-pay

## 2020-10-28 VITALS — BP 97/65 | HR 65 | Wt 110.0 lb

## 2020-10-28 DIAGNOSIS — R300 Dysuria: Secondary | ICD-10-CM

## 2020-10-28 DIAGNOSIS — N3001 Acute cystitis with hematuria: Secondary | ICD-10-CM | POA: Diagnosis not present

## 2020-10-28 DIAGNOSIS — I2510 Atherosclerotic heart disease of native coronary artery without angina pectoris: Secondary | ICD-10-CM | POA: Diagnosis not present

## 2020-10-28 LAB — MICROSCOPIC EXAMINATION: Bacteria, UA: NONE SEEN

## 2020-10-28 LAB — URINALYSIS, ROUTINE W REFLEX MICROSCOPIC
Bilirubin, UA: NEGATIVE
Glucose, UA: NEGATIVE
Ketones, UA: NEGATIVE
Nitrite, UA: NEGATIVE
Specific Gravity, UA: 1.015 (ref 1.005–1.030)
Urobilinogen, Ur: 0.2 mg/dL (ref 0.2–1.0)
pH, UA: 6 (ref 5.0–7.5)

## 2020-10-28 MED ORDER — NITROFURANTOIN MONOHYD MACRO 100 MG PO CAPS: 100.0000 mg | ORAL_CAPSULE | Freq: Two times a day (BID) | ORAL | 0 refills | Status: DC

## 2020-10-28 NOTE — Progress Notes (Signed)
BP 97/65   Pulse 65   Wt 110 lb (49.9 kg)   LMP  (LMP Unknown)   BMI 19.18 kg/m    Subjective:    Patient ID: Melanie Rollins, female    DOB: 08/23/1945, 75 y.o.   MRN: [id number]  HPI: Melanie Rollins is a 75 y.o. female  Chief Complaint  Patient presents with   Urinary Frequency    Patient states she feels like she is having urinary urgency    URINARY SYMPTOMS Duration: about 2 weeks.  Dysuria: no Urinary frequency: yes Urgency: yes Small volume voids: no Symptom severity: mild Urinary incontinence: no Foul odor: yes Hematuria: no Abdominal pain: no Back pain: no Suprapubic pain/pressure: no Flank pain: no Fever:  no Vomiting: no Relief with cranberry juice: no Relief with pyridium: no Status: worse Previous urinary tract infection: no Recurrent urinary tract infection: no Vaginal discharge: no Treatments attempted: increasing fluids    Relevant past medical, surgical, family and social history reviewed and updated as indicated. Interim medical history since our last visit reviewed. Allergies and medications reviewed and updated.  Review of Systems  Constitutional: Negative.   Respiratory:  Positive for cough and shortness of breath. Negative for apnea, choking, chest tightness, wheezing and stridor.   Cardiovascular: Negative.   Gastrointestinal: Negative.   Genitourinary:  Positive for frequency and urgency. Negative for decreased urine volume, difficulty urinating, dyspareunia, dysuria, enuresis, flank pain, genital sores, hematuria, menstrual problem, pelvic pain, vaginal bleeding, vaginal discharge and vaginal pain.  Musculoskeletal: Negative.   Psychiatric/Behavioral: Negative.     Per HPI unless specifically indicated above     Objective:    BP 97/65   Pulse 65   Wt 110 lb (49.9 kg)   LMP  (LMP Unknown)   BMI 19.18 kg/m   Wt Readings from Last 3 Encounters:  10/28/20 110 lb (49.9 kg)  08/31/20 111 lb 9.6 oz (50.6 kg)  08/26/20 112 lb  (50.8 kg)    Physical Exam Vitals and nursing note reviewed.  Constitutional:      General: She is not in acute distress.    Appearance: Normal appearance. She is ill-appearing. She is not toxic-appearing or diaphoretic.     Comments: On oxygen  HENT:     Head: Normocephalic and atraumatic.     Right Ear: External ear normal.     Left Ear: External ear normal.     Nose: Nose normal.     Mouth/Throat:     Mouth: Mucous membranes are moist.     Pharynx: Oropharynx is clear.  Eyes:     General: No scleral icterus.       Right eye: No discharge.        Left eye: No discharge.     Extraocular Movements: Extraocular movements intact.     Conjunctiva/sclera: Conjunctivae normal.     Pupils: Pupils are equal, round, and reactive to light.  Cardiovascular:     Rate and Rhythm: Normal rate and regular rhythm.     Pulses: Normal pulses.     Heart sounds: Normal heart sounds. No murmur heard.   No friction rub. No gallop.  Pulmonary:     Effort: Pulmonary effort is normal. No respiratory distress.     Breath sounds: Normal breath sounds. No stridor. No wheezing, rhonchi or rales.  Chest:     Chest wall: No tenderness.  Musculoskeletal:        General: Normal range of motion.     Cervical  back: Normal range of motion and neck supple.  Skin:    General: Skin is warm and dry.     Capillary Refill: Capillary refill takes less than 2 seconds.     Coloration: Skin is not jaundiced or pale.     Findings: No bruising, erythema, lesion or rash.  Neurological:     General: No focal deficit present.     Mental Status: She is alert and oriented to person, place, and time. Mental status is at baseline.  Psychiatric:        Mood and Affect: Mood normal.        Behavior: Behavior normal.        Thought Content: Thought content normal.        Judgment: Judgment normal.    Results for orders placed or performed in visit on 08/31/20  Comprehensive metabolic panel  Result Value Ref Range    Glucose 110 (H) 65 - 99 mg/dL   BUN 16 8 - 27 mg/dL   Creatinine, Ser 0.89 0.57 - 1.00 mg/dL   eGFR 68 >59 mL/min/1.73   BUN/Creatinine Ratio 18 12 - 28   Sodium 136 134 - 144 mmol/L   Potassium 4.3 3.5 - 5.2 mmol/L   Chloride 98 96 - 106 mmol/L   CO2 18 (L) 20 - 29 mmol/L   Calcium 9.7 8.7 - 10.3 mg/dL   Total Protein 7.0 6.0 - 8.5 g/dL   Albumin 4.4 3.7 - 4.7 g/dL   Globulin, Total 2.6 1.5 - 4.5 g/dL   Albumin/Globulin Ratio 1.7 1.2 - 2.2   Bilirubin Total 0.3 0.0 - 1.2 mg/dL   Alkaline Phosphatase 88 44 - 121 IU/L   AST 53 (H) 0 - 40 IU/L   ALT 48 (H) 0 - 32 IU/L  CBC with Differential/Platelet  Result Value Ref Range   WBC 11.0 (H) 3.4 - 10.8 x10E3/uL   RBC 4.24 3.77 - 5.28 x10E6/uL   Hemoglobin 12.1 11.1 - 15.9 g/dL   Hematocrit 37.5 34.0 - 46.6 %   MCV 88 79 - 97 fL   MCH 28.5 26.6 - 33.0 pg   MCHC 32.3 31.5 - 35.7 g/dL   RDW 12.9 11.7 - 15.4 %   Platelets 403 150 - 450 x10E3/uL   Neutrophils 88 Not Estab. %   Lymphs 6 Not Estab. %   Monocytes 6 Not Estab. %   Eos 0 Not Estab. %   Basos 0 Not Estab. %   Neutrophils Absolute 9.6 (H) 1.4 - 7.0 x10E3/uL   Lymphocytes Absolute 0.7 0.7 - 3.1 x10E3/uL   Monocytes Absolute 0.6 0.1 - 0.9 x10E3/uL   EOS (ABSOLUTE) 0.0 0.0 - 0.4 x10E3/uL   Basophils Absolute 0.0 0.0 - 0.2 x10E3/uL   Immature Granulocytes 0 Not Estab. %   Immature Grans (Abs) 0.0 0.0 - 0.1 x10E3/uL  Lipid Panel w/o Chol/HDL Ratio  Result Value Ref Range   Cholesterol, Total 189 100 - 199 mg/dL   Triglycerides 143 0 - 149 mg/dL   HDL 58 >39 mg/dL   VLDL Cholesterol Cal 25 5 - 40 mg/dL   LDL Chol Calc (NIH) 106 (H) 0 - 99 mg/dL  Iron and TIBC  Result Value Ref Range   Total Iron Binding Capacity 322 250 - 450 ug/dL   UIBC 267 118 - 369 ug/dL   Iron 55 27 - 139 ug/dL   Iron Saturation 17 15 - 55 %  Ferritin  Result Value Ref Range   Ferritin 88 15 -  150 ng/mL  B12  Result Value Ref Range   Vitamin B-12 1,633 (H) 232 - 1,245 pg/mL      Assessment  & Plan:   Problem List Items Addressed This Visit   None Visit Diagnoses     Acute cystitis with hematuria    -  Primary   Will treat with nitrofurantoin. Call if not getting better or getting worse. Continue to monitor.    Dysuria       Relevant Orders   Urinalysis, Routine w reflex microscopic   Urine Culture        Follow up plan: Return if symptoms worsen or fail to improve.

## 2020-11-01 DIAGNOSIS — M1611 Unilateral primary osteoarthritis, right hip: Secondary | ICD-10-CM | POA: Diagnosis not present

## 2020-11-01 DIAGNOSIS — M545 Low back pain, unspecified: Secondary | ICD-10-CM | POA: Diagnosis not present

## 2020-11-01 LAB — URINE CULTURE

## 2020-11-02 DIAGNOSIS — R002 Palpitations: Secondary | ICD-10-CM | POA: Diagnosis not present

## 2020-11-02 DIAGNOSIS — I472 Ventricular tachycardia: Secondary | ICD-10-CM | POA: Diagnosis not present

## 2020-11-02 DIAGNOSIS — R0602 Shortness of breath: Secondary | ICD-10-CM | POA: Diagnosis not present

## 2020-11-02 DIAGNOSIS — J8410 Pulmonary fibrosis, unspecified: Secondary | ICD-10-CM | POA: Diagnosis not present

## 2020-11-02 DIAGNOSIS — I471 Supraventricular tachycardia: Secondary | ICD-10-CM | POA: Diagnosis not present

## 2020-11-02 DIAGNOSIS — I7300 Raynaud's syndrome without gangrene: Secondary | ICD-10-CM | POA: Diagnosis not present

## 2020-11-02 DIAGNOSIS — I2721 Secondary pulmonary arterial hypertension: Secondary | ICD-10-CM | POA: Diagnosis not present

## 2020-11-02 DIAGNOSIS — I2510 Atherosclerotic heart disease of native coronary artery without angina pectoris: Secondary | ICD-10-CM | POA: Diagnosis not present

## 2020-11-02 DIAGNOSIS — I2729 Other secondary pulmonary hypertension: Secondary | ICD-10-CM | POA: Diagnosis not present

## 2020-11-02 DIAGNOSIS — M359 Systemic involvement of connective tissue, unspecified: Secondary | ICD-10-CM | POA: Diagnosis not present

## 2020-11-02 DIAGNOSIS — I480 Paroxysmal atrial fibrillation: Secondary | ICD-10-CM | POA: Diagnosis not present

## 2020-11-12 ENCOUNTER — Encounter: Payer: Self-pay | Admitting: Family Medicine

## 2020-11-15 ENCOUNTER — Ambulatory Visit (INDEPENDENT_AMBULATORY_CARE_PROVIDER_SITE_OTHER): Payer: Medicare Other | Admitting: Family Medicine

## 2020-11-15 ENCOUNTER — Ambulatory Visit: Payer: Medicare Other | Admitting: Family Medicine

## 2020-11-15 ENCOUNTER — Encounter: Payer: Self-pay | Admitting: Family Medicine

## 2020-11-15 ENCOUNTER — Ambulatory Visit
Admission: RE | Admit: 2020-11-15 | Discharge: 2020-11-15 | Disposition: A | Discharge status: Home / Self Care | Payer: Medicare Other | Attending: Family Medicine | Admitting: Family Medicine

## 2020-11-15 ENCOUNTER — Other Ambulatory Visit: Payer: Self-pay

## 2020-11-15 ENCOUNTER — Telehealth: Payer: Self-pay

## 2020-11-15 ENCOUNTER — Ambulatory Visit
Admission: RE | Admit: 2020-11-15 | Discharge: 2020-11-15 | Disposition: A | Discharge status: Home / Self Care | Payer: Medicare Other | Source: Ambulatory Visit | Attending: Family Medicine | Admitting: Family Medicine

## 2020-11-15 VITALS — BP 130/74 | HR 47 | Temp 97.4°F | Ht 62.91 in | Wt 117.2 lb

## 2020-11-15 DIAGNOSIS — G8929 Other chronic pain: Secondary | ICD-10-CM | POA: Insufficient documentation

## 2020-11-15 DIAGNOSIS — I2510 Atherosclerotic heart disease of native coronary artery without angina pectoris: Secondary | ICD-10-CM

## 2020-11-15 DIAGNOSIS — M546 Pain in thoracic spine: Secondary | ICD-10-CM | POA: Insufficient documentation

## 2020-11-15 DIAGNOSIS — R059 Cough, unspecified: Secondary | ICD-10-CM | POA: Diagnosis not present

## 2020-11-15 MED ORDER — KETOROLAC TROMETHAMINE 60 MG/2ML IM SOLN
60.0000 mg | Freq: Once | INTRAMUSCULAR | Status: AC
Start: 2020-11-15 — End: 2020-11-15
  Administered 2020-11-15: 60 mg via INTRAMUSCULAR

## 2020-11-15 MED ORDER — LIDOCAINE 5 % EX PTCH: 1.0000 | MEDICATED_PATCH | CUTANEOUS | 6 refills | Status: AC

## 2020-11-15 NOTE — Progress Notes (Signed)
BP 130/74   Pulse (!) 47   Temp (!) 97.4 F (36.3 C) (Oral)   Ht 5' 2.91" (1.598 m)   Wt 117 lb 3.2 oz (53.2 kg)   LMP  (LMP Unknown)   SpO2 94%   BMI 20.82 kg/m    Subjective:    Patient ID: Melanie Rollins, female    DOB: 10-10-1945, 75 y.o.   MRN: [id number]  HPI: Melanie Rollins is a 75 y.o. female  Chief Complaint  Patient presents with   Back Pain    Has been painful for past 2 months.    BACK PAIN Duration: 2 months Mechanism of injury: no trauma Location: Right and upper back Onset: gradual Severity: moderate Quality: stabbing, knife like Frequency: intermittent Radiation: none Aggravating factors: coughing, moving Alleviating factors: sitting still Status: fluctuating Treatments attempted:  tramadol, baclofen, rest, and ice  Relief with NSAIDs?: no Nighttime pain:  no Paresthesias / decreased sensation:  no Bowel / bladder incontinence:  no Fevers:  no Dysuria / urinary frequency:  no  Relevant past medical, surgical, family and social history reviewed and updated as indicated. Interim medical history since our last visit reviewed. Allergies and medications reviewed and updated.  Review of Systems  Constitutional: Negative.   Respiratory: Negative.    Cardiovascular: Negative.   Gastrointestinal: Negative.   Musculoskeletal:  Positive for back pain and myalgias. Negative for arthralgias, gait problem, joint swelling, neck pain and neck stiffness.  Skin: Negative.   Neurological: Negative.   Psychiatric/Behavioral: Negative.     Per HPI unless specifically indicated above     Objective:    BP 130/74   Pulse (!) 47   Temp (!) 97.4 F (36.3 C) (Oral)   Ht 5' 2.91" (1.598 m)   Wt 117 lb 3.2 oz (53.2 kg)   LMP  (LMP Unknown)   SpO2 94%   BMI 20.82 kg/m   Wt Readings from Last 3 Encounters:  11/15/20 117 lb 3.2 oz (53.2 kg)  10/28/20 110 lb (49.9 kg)  08/31/20 111 lb 9.6 oz (50.6 kg)    Physical Exam Vitals and nursing note reviewed.   Constitutional:      General: She is not in acute distress.    Appearance: Normal appearance. She is not ill-appearing, toxic-appearing or diaphoretic.  HENT:     Head: Normocephalic and atraumatic.     Right Ear: External ear normal.     Left Ear: External ear normal.     Nose: Nose normal.     Mouth/Throat:     Mouth: Mucous membranes are moist.     Pharynx: Oropharynx is clear.  Eyes:     General: No scleral icterus.       Right eye: No discharge.        Left eye: No discharge.     Extraocular Movements: Extraocular movements intact.     Conjunctiva/sclera: Conjunctivae normal.     Pupils: Pupils are equal, round, and reactive to light.  Cardiovascular:     Rate and Rhythm: Normal rate and regular rhythm.     Pulses: Normal pulses.     Heart sounds: Normal heart sounds. No murmur heard.   No friction rub. No gallop.  Pulmonary:     Effort: Pulmonary effort is normal. No respiratory distress.     Breath sounds: Normal breath sounds. No stridor. No wheezing, rhonchi or rales.  Chest:     Chest wall: No tenderness.  Musculoskeletal:  General: Normal range of motion.     Cervical back: Normal range of motion and neck supple.  Skin:    General: Skin is warm and dry.     Capillary Refill: Capillary refill takes less than 2 seconds.     Coloration: Skin is not jaundiced or pale.     Findings: No bruising, erythema, lesion or rash.  Neurological:     General: No focal deficit present.     Mental Status: She is alert and oriented to person, place, and time. Mental status is at baseline.  Psychiatric:        Mood and Affect: Mood normal.        Behavior: Behavior normal.        Thought Content: Thought content normal.        Judgment: Judgment normal.    Results for orders placed or performed in visit on 10/28/20  Urine Culture   Specimen: Urine   UR  Result Value Ref Range   Urine Culture, Routine Final report    Organism ID, Bacteria Comment   Microscopic  Examination   Urine  Result Value Ref Range   WBC, UA 0-5 0 - 5 /hpf   RBC >30R 0 - 2 /hpf   Epithelial Cells (non renal) 0-10 0 - 10 /hpf   Mucus, UA Present (A) Not Estab.   Bacteria, UA None seen None seen/Few  Urinalysis, Routine w reflex microscopic  Result Value Ref Range   Specific Gravity, UA 1.015 1.005 - 1.030   pH, UA 6.0 5.0 - 7.5   Color, UA Yellow Yellow   Appearance Ur Clear Clear   Leukocytes,UA 1+ (A) Negative   Protein,UA Trace (A) Negative/Trace   Glucose, UA Negative Negative   Ketones, UA Negative Negative   RBC, UA 3+ (A) Negative   Bilirubin, UA Negative Negative   Urobilinogen, Ur 0.2 0.2 - 1.0 mg/dL   Nitrite, UA Negative Negative   Microscopic Examination See below:       Assessment & Plan:   Problem List Items Addressed This Visit   None Visit Diagnoses     Chronic right-sided thoracic back pain    -  Primary   Given chronic prednisone, concern for compression or rib fx. Will check x-rays. Await results. Toradol shot given today. Lidocaine patches. Call with concerns.    Relevant Medications   ketorolac (TORADOL) injection 60 mg (Completed)   Other Relevant Orders   DG Thoracic Spine W/Swimmers   DG Chest 2 View        Follow up plan: Return in about 4 weeks (around 12/13/2020).

## 2020-11-16 ENCOUNTER — Telehealth: Payer: Self-pay

## 2020-11-16 NOTE — Telephone Encounter (Signed)
Copied from [place] [phone]. Topic: General - Other >> Nov 16, 2020 12:51 PM Mcneil, Ja-Kwan wrote: Reason for CRM: Pt would like a return call to discuss x-ray results. Cb# [phone] >> Nov 16, 2020  1:01 PM Lennox Solders wrote: Pt will be with her daughter kim barger and would like for someone to call her daughter at [phone] for xray results. Maudie Mercury is on [facility]

## 2020-11-16 NOTE — Telephone Encounter (Signed)
Patient has made an additional call regarding their lab results  Please contact further when possible

## 2020-11-16 NOTE — Telephone Encounter (Signed)
Called and let patient know that x-rays have not yet been reviewed by Dr. Wynetta Emery as she is not here this afternoon. Scans were resulted today.   Routing to provider for review when able.

## 2020-11-17 ENCOUNTER — Telehealth: Payer: Self-pay

## 2020-11-17 DIAGNOSIS — R0902 Hypoxemia: Secondary | ICD-10-CM | POA: Diagnosis not present

## 2020-11-17 DIAGNOSIS — R404 Transient alteration of awareness: Secondary | ICD-10-CM | POA: Diagnosis not present

## 2020-11-17 DIAGNOSIS — R0689 Other abnormalities of breathing: Secondary | ICD-10-CM | POA: Diagnosis not present

## 2020-11-17 DIAGNOSIS — R000 Tachycardia, unspecified: Secondary | ICD-10-CM | POA: Diagnosis not present

## 2020-11-17 DIAGNOSIS — I499 Cardiac arrhythmia, unspecified: Secondary | ICD-10-CM | POA: Diagnosis not present

## 2020-11-18 ENCOUNTER — Telehealth: Payer: Self-pay

## 2020-11-18 NOTE — Telephone Encounter (Signed)
See result note

## 2020-11-18 NOTE — Telephone Encounter (Signed)
PA denied.

## 2020-11-18 NOTE — Telephone Encounter (Signed)
PA for Lidocaine patches initiated and submitted via Cover My Meds. Key: [id number]

## 2020-11-18 NOTE — Telephone Encounter (Signed)
Copied from [place] [phone]. Topic: General - Other >> Nov 18, 2020 11:16 AM Alanda Slim E wrote: Reason for [id number] home will be sending over Death certificate on DAVE /

## 2020-11-18 DEATH — deceased

## 2020-11-24 NOTE — Telephone Encounter (Signed)
I have still not received any notification of a death certificate and do not have one assigned to me in [place]- can we please check to see if this has already been done? We may need to find out why the funeral home can't find me.

## 2021-03-02 ENCOUNTER — Ambulatory Visit: Payer: Medicare Other | Admitting: Family Medicine

## 2021-04-24 IMAGING — RF DG ESOPHAGUS
8 of 10 series · 15 of 21 positions shown · non-contrast
Comparison: None.

CLINICAL DATA: Interstitial lung disease, dysphagia

EXAM:
ESOPHOGRAM/BARIUM SWALLOW
TECHNIQUE: Combined double contrast and single contrast examination performed
using effervescent crystals, thick barium liquid, and thin barium
liquid.
FLUOROSCOPY TIME:  Fluoroscopy Time:  1 minute
Radiation Exposure Index (if provided by the fluoroscopic device):
3.3 mGy

[Series 1: cp_standard · 0.27mm/px · 2 of 48 frames shown (1 of 8)]
[frame 8/48]
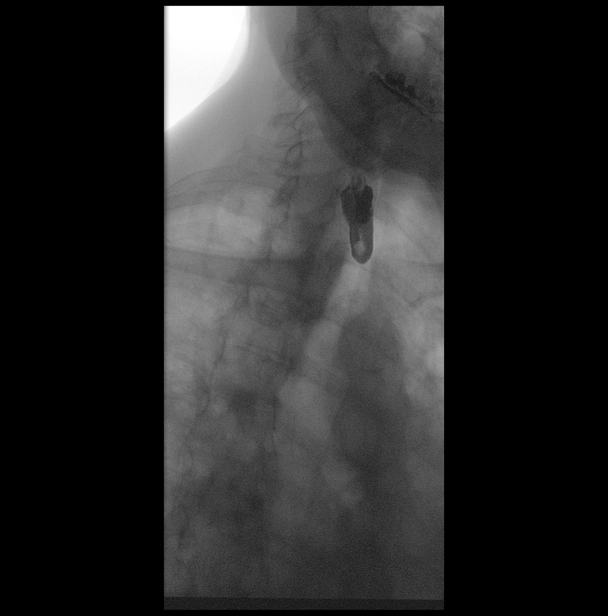
[frame 41/48]
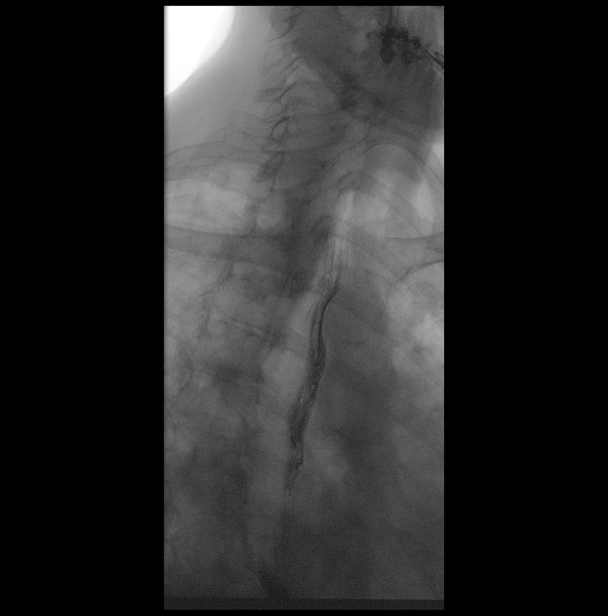

[Series 2: cp_standard · 0.27mm/px · 3 of 76 frames shown (2 of 8)]
[frame 12/76]
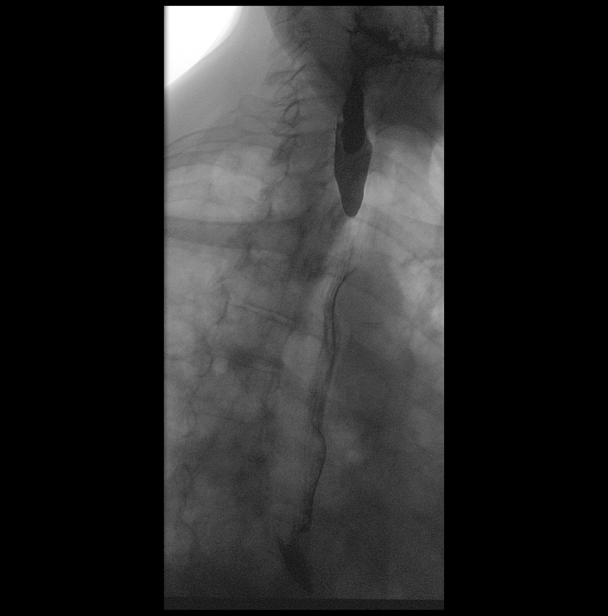
[frame 36/76]
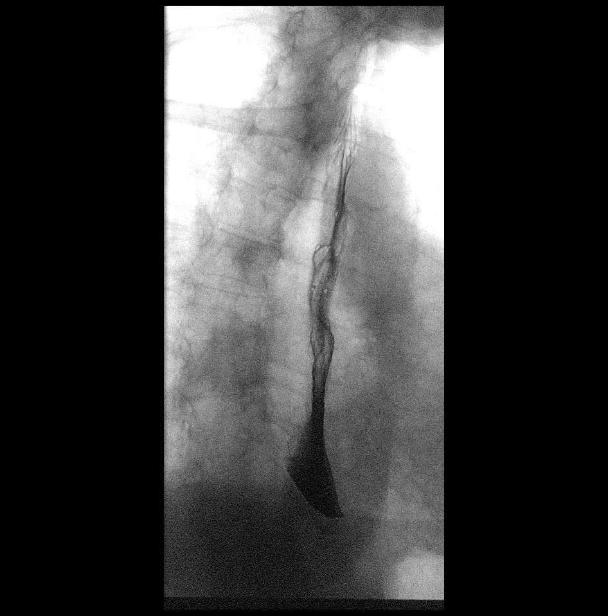
[frame 65/76]
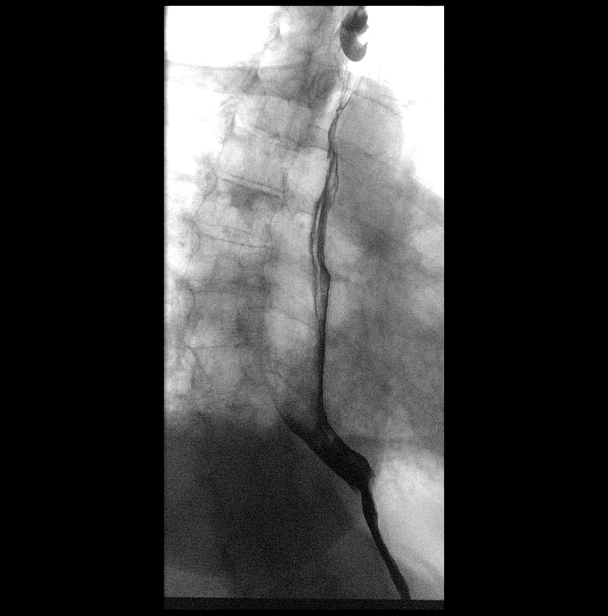

[Series 3: cp_standard · 0.27mm/px · 3 of 86 frames shown (3 of 8)]
[frame 1/86]
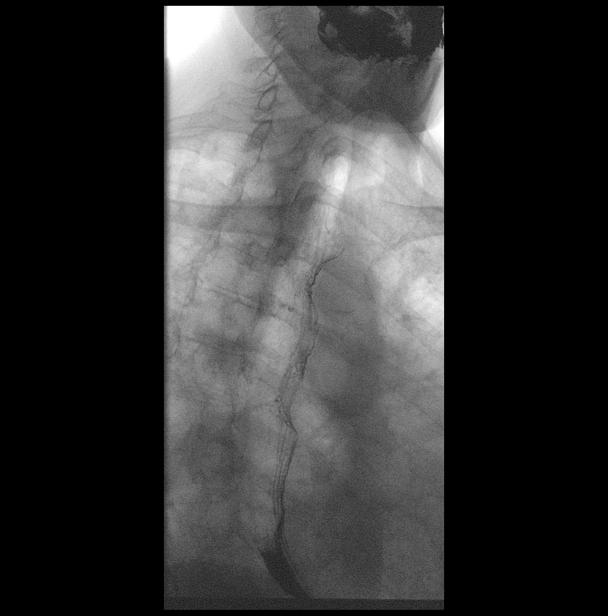
[frame 44/86]
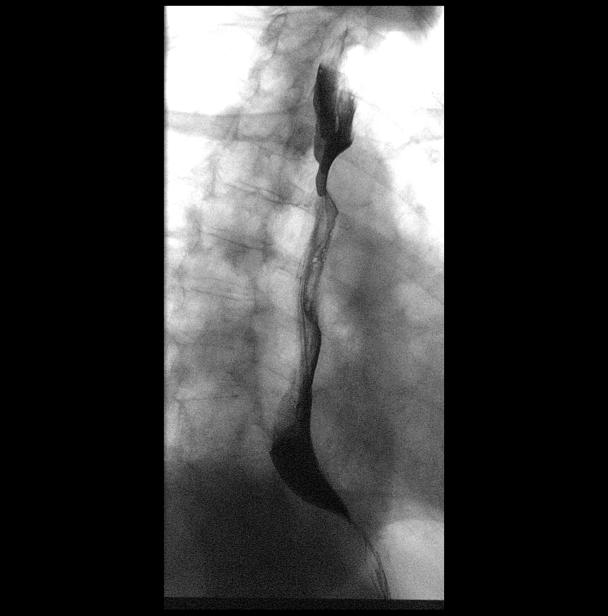
[frame 74/86]
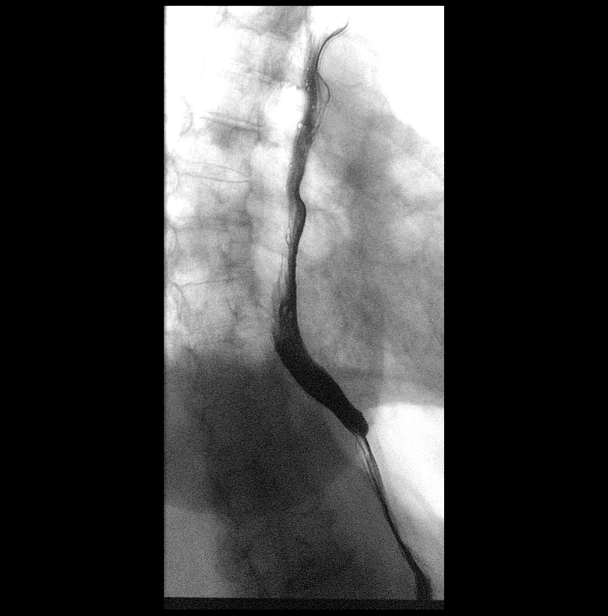

[Series 4: cp_standard · 0.27mm/px · 3 of 65 frames shown (4 of 8)]
[frame 8/65]
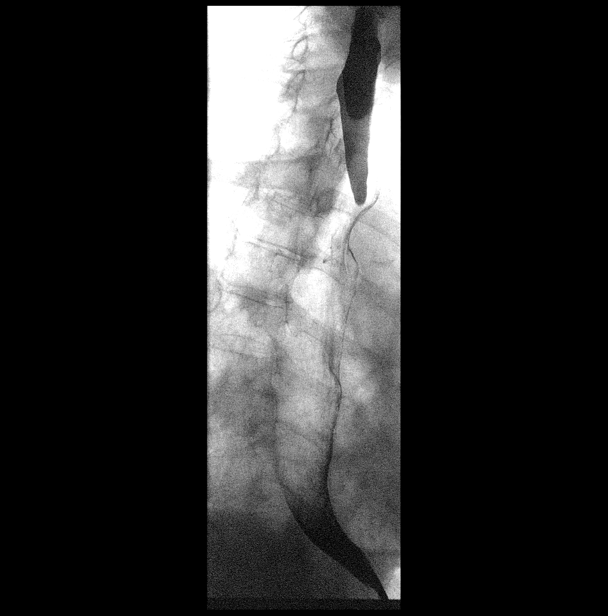
[frame 33/65]
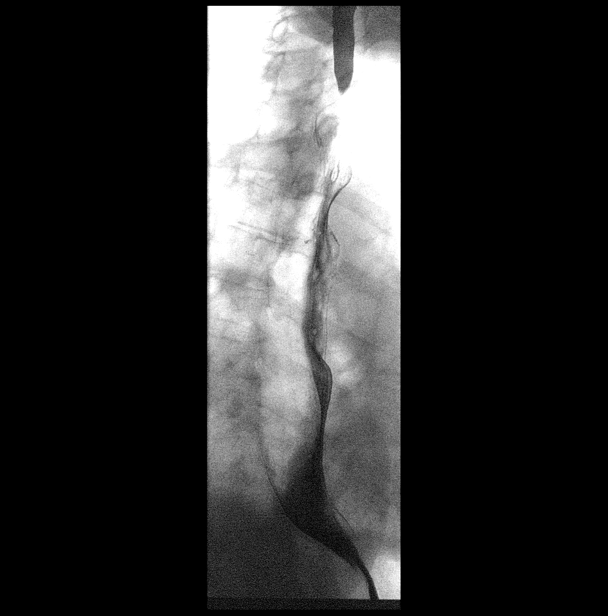
[frame 56/65]
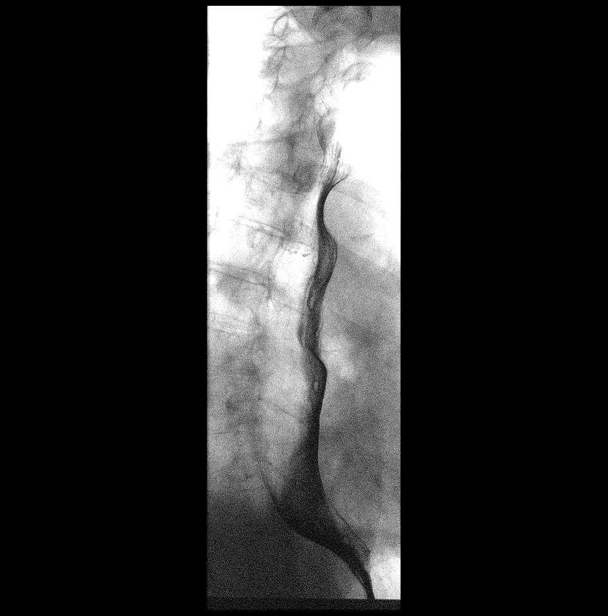

[Series 6: cp_standard · 0.27mm/px · 1 of 1 slices shown (5 of 8)]
[im 1/1]
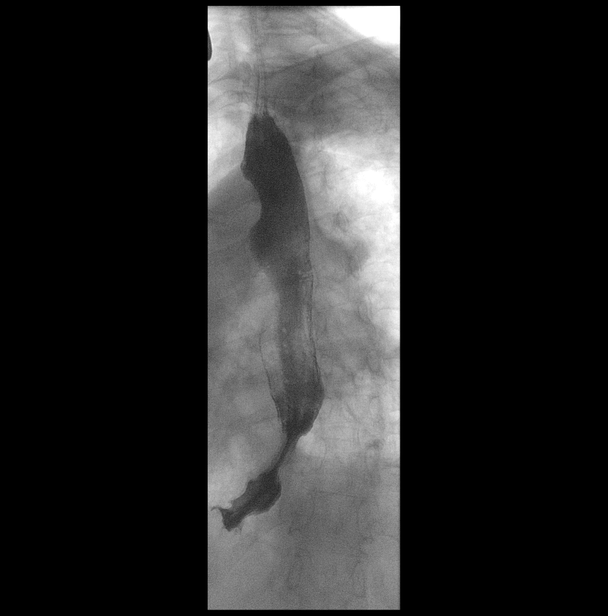

[Series 7: cp_standard · 0.27mm/px · 1 of 1 slices shown (6 of 8)]
[im 1/1]
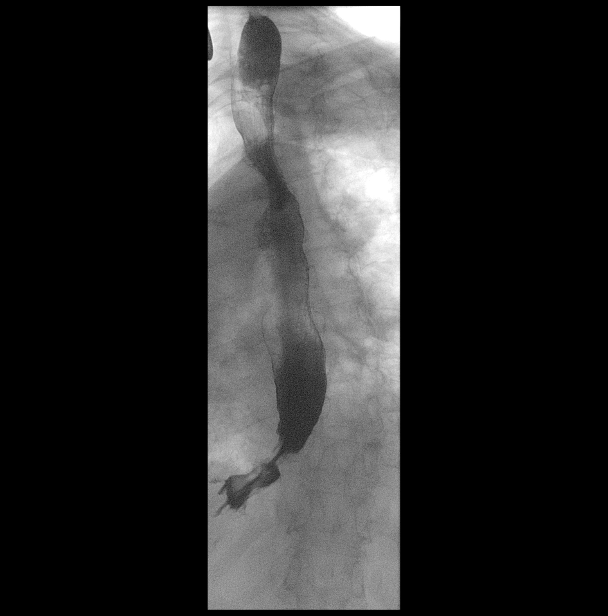

[Series 8: cp_standard · 0.27mm/px · 1 of 1 slices shown (7 of 8)]
[im 1/1]
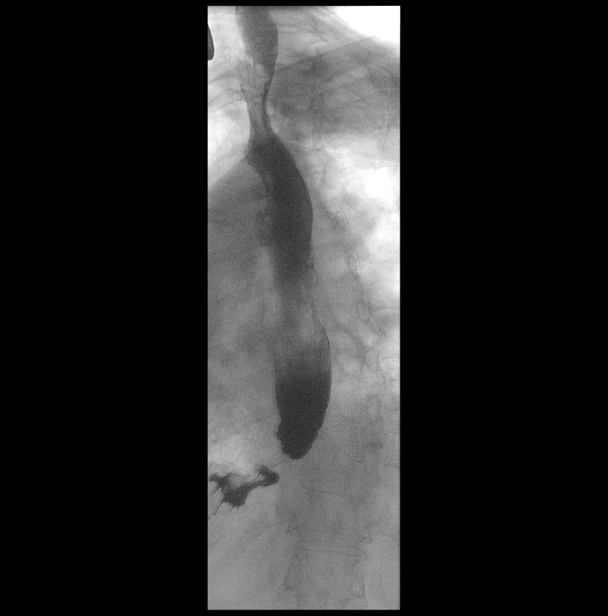

[Series 10: cp_standard · 0.28mm/px · 1 of 1 slices shown (8 of 8)]
[im 1/1]
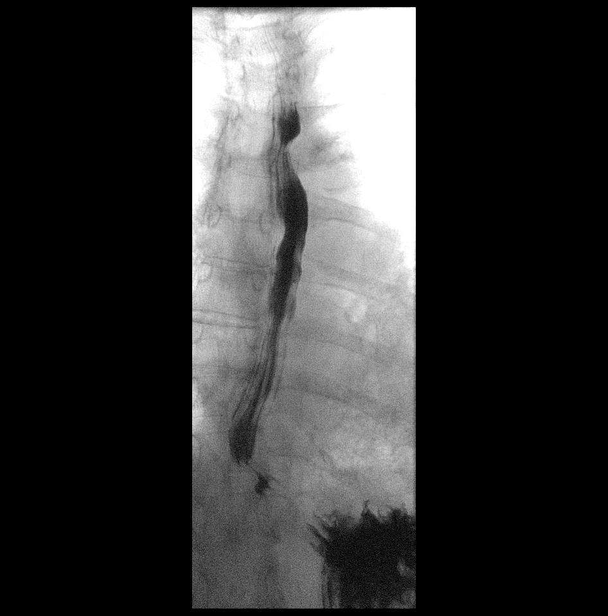

[15 of 21 positions shown; findings below may reference images not displayed]

FINDINGS: Normal hypopharyngeal contours. No aspiration. There is no
esophageal mass or stricture. Esophageal motility is within normal
limits. There is no hiatal hernia. Mild spontaneous gastroesophageal
reflux.
IMPRESSION: Mild gastroesophageal reflux.  Otherwise unremarkable study.

## 2021-08-28 ENCOUNTER — Ambulatory Visit: Payer: Medicare Other
